# Patient Record
Sex: Male | Born: 1950 | Race: White | Hispanic: No | Smoking: Never smoker
Health system: Southern US, Community
[De-identification: ages and names within clinical notes are randomized; demographics above are authoritative.]

## PROBLEM LIST (undated history)

## (undated) DIAGNOSIS — K759 Inflammatory liver disease, unspecified: Secondary | ICD-10-CM

## (undated) DIAGNOSIS — Z8719 Personal history of other diseases of the digestive system: Secondary | ICD-10-CM

## (undated) DIAGNOSIS — I2510 Atherosclerotic heart disease of native coronary artery without angina pectoris: Secondary | ICD-10-CM

## (undated) DIAGNOSIS — C801 Malignant (primary) neoplasm, unspecified: Secondary | ICD-10-CM

## (undated) DIAGNOSIS — R42 Dizziness and giddiness: Secondary | ICD-10-CM

## (undated) DIAGNOSIS — C4490 Unspecified malignant neoplasm of skin, unspecified: Secondary | ICD-10-CM

## (undated) DIAGNOSIS — T4145XA Adverse effect of unspecified anesthetic, initial encounter: Secondary | ICD-10-CM

## (undated) DIAGNOSIS — K219 Gastro-esophageal reflux disease without esophagitis: Secondary | ICD-10-CM

## (undated) DIAGNOSIS — T7840XA Allergy, unspecified, initial encounter: Secondary | ICD-10-CM

## (undated) DIAGNOSIS — Z8669 Personal history of other diseases of the nervous system and sense organs: Secondary | ICD-10-CM

## (undated) DIAGNOSIS — T8859XA Other complications of anesthesia, initial encounter: Secondary | ICD-10-CM

## (undated) DIAGNOSIS — M1990 Unspecified osteoarthritis, unspecified site: Secondary | ICD-10-CM

## (undated) DIAGNOSIS — N400 Enlarged prostate without lower urinary tract symptoms: Secondary | ICD-10-CM

## (undated) DIAGNOSIS — I10 Essential (primary) hypertension: Secondary | ICD-10-CM

## (undated) DIAGNOSIS — R51 Headache: Secondary | ICD-10-CM

## (undated) DIAGNOSIS — Z85820 Personal history of malignant melanoma of skin: Secondary | ICD-10-CM

## (undated) DIAGNOSIS — R7303 Prediabetes: Secondary | ICD-10-CM

## (undated) DIAGNOSIS — E785 Hyperlipidemia, unspecified: Secondary | ICD-10-CM

## (undated) DIAGNOSIS — F419 Anxiety disorder, unspecified: Secondary | ICD-10-CM

## (undated) DIAGNOSIS — G4730 Sleep apnea, unspecified: Secondary | ICD-10-CM

## (undated) DIAGNOSIS — E8809 Other disorders of plasma-protein metabolism, not elsewhere classified: Secondary | ICD-10-CM

## (undated) HISTORY — DX: Hyperlipidemia, unspecified: E78.5

## (undated) HISTORY — PX: OTHER SURGICAL HISTORY: SHX169

## (undated) HISTORY — DX: Allergy, unspecified, initial encounter: T78.40XA

## (undated) HISTORY — DX: Inflammatory liver disease, unspecified: K75.9

## (undated) HISTORY — DX: Personal history of other diseases of the digestive system: Z87.19

## (undated) HISTORY — PX: KNEE ARTHROSCOPY: SUR90

## (undated) HISTORY — DX: Unspecified malignant neoplasm of skin, unspecified: C44.90

## (undated) HISTORY — PX: NASAL SINUS SURGERY: SHX719

## (undated) HISTORY — PX: HERNIA REPAIR: SHX51

## (undated) HISTORY — PX: COLONOSCOPY: SHX174

## (undated) HISTORY — DX: Personal history of malignant melanoma of skin: Z85.820

## (undated) HISTORY — PX: UPPER GASTROINTESTINAL ENDOSCOPY: SHX188

## (undated) HISTORY — PX: JOINT REPLACEMENT: SHX530

## (undated) HISTORY — PX: KNEE SURGERY: SHX244

## (undated) HISTORY — DX: Headache: R51

## (undated) HISTORY — PX: EYE SURGERY: SHX253

---
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
## 2004-07-26 ENCOUNTER — Encounter: Payer: Self-pay | Admitting: Internal Medicine

## 2004-09-29 ENCOUNTER — Encounter: Payer: Self-pay | Admitting: Internal Medicine

## 2007-10-15 ENCOUNTER — Encounter: Admission: RE | Admit: 2007-10-15 | Discharge: 2007-10-15 | Payer: Self-pay | Admitting: Internal Medicine

## 2008-11-17 ENCOUNTER — Ambulatory Visit: Payer: Self-pay | Admitting: Internal Medicine

## 2008-11-17 DIAGNOSIS — E785 Hyperlipidemia, unspecified: Secondary | ICD-10-CM

## 2008-11-17 DIAGNOSIS — F4323 Adjustment disorder with mixed anxiety and depressed mood: Secondary | ICD-10-CM

## 2008-11-17 DIAGNOSIS — R519 Headache, unspecified: Secondary | ICD-10-CM | POA: Insufficient documentation

## 2008-11-17 DIAGNOSIS — Z85820 Personal history of malignant melanoma of skin: Secondary | ICD-10-CM

## 2008-11-17 DIAGNOSIS — J309 Allergic rhinitis, unspecified: Secondary | ICD-10-CM

## 2008-11-17 DIAGNOSIS — R51 Headache: Secondary | ICD-10-CM

## 2008-11-17 LAB — CONVERTED CEMR LAB
ALT: 71 units/L — ABNORMAL HIGH (ref 0–53)
AST: 29 units/L (ref 0–37)
Albumin: 4.4 g/dL (ref 3.5–5.2)
Alkaline Phosphatase: 58 units/L (ref 39–117)
Basophils Relative: 1 % (ref 0–1)
Calcium: 9.4 mg/dL (ref 8.4–10.5)
Chloride: 106 meq/L (ref 96–112)
Creatinine, Ser: 0.87 mg/dL (ref 0.40–1.50)
Eosinophils Absolute: 0.2 10*3/uL (ref 0.0–0.7)
HDL: 37 mg/dL — ABNORMAL LOW (ref >39)
Lymphs Abs: 3.5 10*3/uL (ref 0.7–4.0)
MCV: 88.6 fL (ref 78.0–100.0)
Neutrophils Relative %: 40 % — ABNORMAL LOW (ref 43–77)
Platelets: 237 10*3/uL (ref 150–400)
Total Protein: 7 g/dL (ref 6.0–8.3)
Triglycerides: 137 mg/dL (ref <150)
WBC: 7.4 10*3/uL (ref 4.0–10.5)

## 2009-01-17 ENCOUNTER — Ambulatory Visit: Payer: Self-pay | Admitting: Internal Medicine

## 2009-01-17 DIAGNOSIS — E119 Type 2 diabetes mellitus without complications: Secondary | ICD-10-CM | POA: Insufficient documentation

## 2009-01-17 DIAGNOSIS — H538 Other visual disturbances: Secondary | ICD-10-CM | POA: Insufficient documentation

## 2009-01-17 DIAGNOSIS — R413 Other amnesia: Secondary | ICD-10-CM

## 2009-01-30 ENCOUNTER — Encounter: Payer: Self-pay | Admitting: Internal Medicine

## 2009-01-30 LAB — HM DIABETES EYE EXAM: HM Diabetic Eye Exam: NORMAL

## 2009-02-03 ENCOUNTER — Telehealth: Payer: Self-pay | Admitting: Internal Medicine

## 2009-02-03 DIAGNOSIS — R252 Cramp and spasm: Secondary | ICD-10-CM

## 2009-02-20 ENCOUNTER — Encounter: Payer: Self-pay | Admitting: Internal Medicine

## 2009-02-21 ENCOUNTER — Telehealth: Payer: Self-pay | Admitting: Internal Medicine

## 2009-03-21 ENCOUNTER — Ambulatory Visit: Payer: Self-pay | Admitting: Internal Medicine

## 2009-03-21 LAB — CONVERTED CEMR LAB
ALT: 44 units/L (ref 0–53)
Bilirubin, Direct: 0.1 mg/dL (ref 0.0–0.3)
Folate: >20 ng/mL
Total Bilirubin: 0.4 mg/dL (ref 0.3–1.2)
Total CHOL/HDL Ratio: 6.9
VLDL: 30 mg/dL (ref 0–40)
Vitamin B-12: 712 pg/mL (ref 211–911)

## 2009-03-22 ENCOUNTER — Ambulatory Visit: Payer: Self-pay | Admitting: Internal Medicine

## 2009-03-22 DIAGNOSIS — G4730 Sleep apnea, unspecified: Secondary | ICD-10-CM

## 2009-03-22 DIAGNOSIS — G4700 Insomnia, unspecified: Secondary | ICD-10-CM | POA: Insufficient documentation

## 2009-03-22 DIAGNOSIS — G4733 Obstructive sleep apnea (adult) (pediatric): Secondary | ICD-10-CM | POA: Insufficient documentation

## 2009-05-10 ENCOUNTER — Ambulatory Visit: Payer: Self-pay | Admitting: Internal Medicine

## 2009-05-10 LAB — CONVERTED CEMR LAB
Alkaline Phosphatase: 51 units/L (ref 39–117)
Bilirubin, Direct: 0.1 mg/dL (ref 0.0–0.3)
Cholesterol: 183 mg/dL (ref 0–200)
Indirect Bilirubin: 0.3 mg/dL (ref 0.0–0.9)
LDL Cholesterol: 115 mg/dL — ABNORMAL HIGH (ref 0–99)
Total Protein: 7 g/dL (ref 6.0–8.3)
Triglycerides: 138 mg/dL (ref <150)

## 2009-05-17 ENCOUNTER — Ambulatory Visit: Payer: Self-pay | Admitting: Internal Medicine

## 2009-08-02 ENCOUNTER — Encounter: Payer: Self-pay | Admitting: Internal Medicine

## 2009-08-07 ENCOUNTER — Ambulatory Visit: Payer: Self-pay | Admitting: Internal Medicine

## 2009-08-07 DIAGNOSIS — G5600 Carpal tunnel syndrome, unspecified upper limb: Secondary | ICD-10-CM

## 2009-08-08 ENCOUNTER — Telehealth: Payer: Self-pay | Admitting: Internal Medicine

## 2009-08-22 ENCOUNTER — Telehealth: Payer: Self-pay | Admitting: Internal Medicine

## 2009-09-03 ENCOUNTER — Emergency Department (HOSPITAL_BASED_OUTPATIENT_CLINIC_OR_DEPARTMENT_OTHER): Admission: EM | Admit: 2009-09-03 | Discharge: 2009-09-03 | Payer: Self-pay | Admitting: Emergency Medicine

## 2009-11-13 ENCOUNTER — Encounter: Payer: Self-pay | Admitting: Internal Medicine

## 2009-11-13 LAB — CONVERTED CEMR LAB
CO2: 25 meq/L (ref 19–32)
Calcium: 9.5 mg/dL (ref 8.4–10.5)
Chloride: 107 meq/L (ref 96–112)
Glucose, Bld: 136 mg/dL — ABNORMAL HIGH (ref 70–99)
Sodium: 140 meq/L (ref 135–145)

## 2009-11-15 ENCOUNTER — Encounter: Payer: Self-pay | Admitting: Gastroenterology

## 2009-11-15 ENCOUNTER — Ambulatory Visit: Payer: Self-pay | Admitting: Internal Medicine

## 2009-11-15 DIAGNOSIS — R198 Other specified symptoms and signs involving the digestive system and abdomen: Secondary | ICD-10-CM | POA: Insufficient documentation

## 2009-11-15 DIAGNOSIS — M25569 Pain in unspecified knee: Secondary | ICD-10-CM

## 2009-11-15 DIAGNOSIS — M1990 Unspecified osteoarthritis, unspecified site: Secondary | ICD-10-CM

## 2009-11-15 LAB — CONVERTED CEMR LAB
Basophils Absolute: 0 10*3/uL (ref 0.0–0.1)
Basophils Relative: 1 % (ref 0–1)
Eosinophils Absolute: 0.2 10*3/uL (ref 0.0–0.7)
Eosinophils Relative: 2 % (ref 0–5)
HCT: 43.3 % (ref 39.0–52.0)
Hemoglobin: 15.4 g/dL (ref 13.0–17.0)
MCHC: 35.6 g/dL (ref 30.0–36.0)
MCV: 86.4 fL (ref 78.0–100.0)
Monocytes Absolute: 0.5 10*3/uL (ref 0.1–1.0)
Monocytes Relative: 7 % (ref 3–12)
Neutro Abs: 3.1 10*3/uL (ref 1.7–7.7)
RDW: 12.5 % (ref 11.5–15.5)

## 2009-11-16 ENCOUNTER — Encounter: Payer: Self-pay | Admitting: Internal Medicine

## 2009-11-16 ENCOUNTER — Telehealth: Payer: Self-pay | Admitting: Internal Medicine

## 2010-01-12 ENCOUNTER — Encounter (INDEPENDENT_AMBULATORY_CARE_PROVIDER_SITE_OTHER): Payer: Self-pay | Admitting: *Deleted

## 2010-01-12 ENCOUNTER — Ambulatory Visit: Payer: Self-pay | Admitting: Internal Medicine

## 2010-01-12 ENCOUNTER — Encounter: Payer: Self-pay | Admitting: Internal Medicine

## 2010-01-12 ENCOUNTER — Encounter: Payer: Self-pay | Admitting: Gastroenterology

## 2010-01-12 DIAGNOSIS — D239 Other benign neoplasm of skin, unspecified: Secondary | ICD-10-CM | POA: Insufficient documentation

## 2010-01-12 LAB — CONVERTED CEMR LAB
Albumin: 4.9 g/dL (ref 3.5–5.2)
Alkaline Phosphatase: 49 units/L (ref 39–117)
BUN: 21 mg/dL (ref 6–23)
CO2: 28 meq/L (ref 19–32)
Chloride: 104 meq/L (ref 96–112)
Creatinine, Ser: 0.95 mg/dL (ref 0.40–1.50)
Glucose, Bld: 131 mg/dL — ABNORMAL HIGH (ref 70–99)
Indirect Bilirubin: 0.2 mg/dL (ref 0.0–0.9)
LDL Cholesterol: 101 mg/dL — ABNORMAL HIGH (ref 0–99)
Potassium: 4.7 meq/L (ref 3.5–5.3)
Total Bilirubin: 0.3 mg/dL (ref 0.3–1.2)
Total Protein: 7.2 g/dL (ref 6.0–8.3)
VLDL: 45 mg/dL — ABNORMAL HIGH (ref 0–40)

## 2010-01-16 ENCOUNTER — Encounter: Payer: Self-pay | Admitting: Internal Medicine

## 2010-01-25 ENCOUNTER — Ambulatory Visit (HOSPITAL_COMMUNITY)
Admission: RE | Admit: 2010-01-25 | Discharge: 2010-01-25 | Payer: Self-pay | Source: Home / Self Care | Admitting: Specialist

## 2010-02-13 ENCOUNTER — Ambulatory Visit: Payer: Self-pay | Admitting: Gastroenterology

## 2010-02-20 ENCOUNTER — Ambulatory Visit
Admission: RE | Admit: 2010-02-20 | Discharge: 2010-02-20 | Payer: Self-pay | Source: Home / Self Care | Attending: Family | Admitting: Family

## 2010-02-20 DIAGNOSIS — K439 Ventral hernia without obstruction or gangrene: Secondary | ICD-10-CM | POA: Insufficient documentation

## 2010-03-07 ENCOUNTER — Telehealth: Payer: Self-pay | Admitting: Internal Medicine

## 2010-03-09 ENCOUNTER — Encounter: Payer: Self-pay | Admitting: Internal Medicine

## 2010-03-13 ENCOUNTER — Encounter: Payer: Self-pay | Admitting: Internal Medicine

## 2010-03-27 ENCOUNTER — Encounter: Payer: Self-pay | Admitting: Internal Medicine

## 2010-03-27 NOTE — Progress Notes (Signed)
Summary: refill--Zolpidem  Phone Note Refill Request Message from:  [facility] [address]   on 08/21/09  6:29PM  Refills Requested: Medication #1:  ZOLPIDEM TARTRATE 10 MG TABS one by mouth at bedtime   Dosage confirmed as above?Dosage Confirmed   Supply Requested: 1 month   Last Refilled: 07/22/2009  Method Requested: Telephone to Pharmacy Next Appointment Scheduled: 11/15/09--Dr. Artist Pais  Follow-up for Phone Call        ok to refill x 3 Follow-up by: D. Thomos Lemons DO,  August 22, 2009 1:34 PM  Additional Follow-up for Phone Call Additional follow up Details #1::        Rx called to pharmacy to [facility] Additional Follow-up by: Glendell Docker CMA,  August 22, 2009 3:32 PM    Prescriptions: ZOLPIDEM TARTRATE 10 MG TABS (ZOLPIDEM TARTRATE) one by mouth at bedtime  #30 x 3   Entered by:   Glendell Docker CMA   Authorized by:   D. Thomos Lemons DO   Signed by:   Glendell Docker CMA on 08/22/2009   Method used:   Telephoned to ...       [facility]  [place] [address] ([facility]ail)       [address]       [place], [place]  [address]       Ph: [phone] or [phone]       Fax: [phone]   RxID:   [id number]

## 2010-03-27 NOTE — Assessment & Plan Note (Signed)
Summary: 2 month follow up/mhf   Vital Signs:  Patient profile:   60 year old male Weight:      241.25 pounds BMI:     32.84 O2 Sat:      96 % on Room air Temp:     98.7 degrees F oral Pulse rate:   77 / minute Pulse rhythm:   regular Resp:     18 per minute BP sitting:   116 / 80  (right arm) Cuff size:   large  Vitals Entered By: Glendell Docker CMA (May 17, 2009 8:14 AM)  O2 Flow:  Room air CC: Rm 3- 2 month follow up    Primary Care Provider:  Dondra Spry DO  CC:  Rm 3- 2 month follow up .  History of Present Illness:  Hyperlipidemia Follow-Up      This is a 60 year old man who presents for Hyperlipidemia follow-up.  The patient denies muscle aches and GI upset.  The patient denies the following symptoms: chest pain/pressure.  Compliance with medications (by patient report) has been near 100%.  Dietary compliance has been good.  The patient reports exercising occasionally.    DM II borderline - watching his carb intake.  lost wt. feeling better  Allergies: 1)  ! Pcn 2)  ! Heparin 3)  ! Succinylcholine Chloride (Succinylcholine Chloride)  Past History:  Past Medical History: Headache Allergic rhinitis  Hx of infectious Hepatitis  Hyperlipidemia  Hx of Ulcerative colitis Hx of melanoma (face and forehead) - previously followed by Derm in [place]. OSA - uses CPAP  Past Surgical History: Tonsillectomy 1969  Appendectomy- 1960    Family History: Family History Diabetes 1st degree relative Family History Ovarian cancer Brain Tumor - brother     Social History: Married Wife works at cath lab at [employer] wife has terminal cancer Occupation: Systems developer  Alcohol use-yes   Never Smoked  Physical Exam  General:  alert, well-developed, and well-nourished.   Head:  normocephalic and atraumatic.   Neck:  No deformities, masses, or tenderness noted.no carotid bruits.   Lungs:  normal respiratory effort and normal breath sounds.   Heart:  normal rate, regular  rhythm, and no gallop.   Extremities:  No lower extremity edema  Psych:  normally interactive, good eye contact, not anxious appearing, and not depressed appearing.     Impression & Recommendations:  Problem # 1:  HYPERLIPIDEMIA (ICD-272.4) He is tolerating Livalo.  Maintain current medication regimen.  His updated medication list for this problem includes:    Livalo 2 Mg Tabs (Pitavastatin calcium) ..... One by mouth once daily  Labs Reviewed: SGOT: 30 (05/10/2009)   SGPT: 56 (05/10/2009)   HDL:40 (05/10/2009), 40 (03/21/2009)  LDL:115 (05/10/2009), 205 (03/21/2009)  Chol:183 (05/10/2009), 275 (03/21/2009)  Trig:138 (05/10/2009), 150 (03/21/2009)  Problem # 2:  DIABETES MELLITUS, TYPE II, BORDERLINE (ICD-790.29) Pt with 5-10 lbs wt loss.  pt advised to limit carb intake to 100 grams per day.  His updated medication list for this problem includes:    Metformin Hcl 500 Mg Tabs (Metformin hcl) .Marland Kitchen... 1/2 by mouth two times a day x 7 days, then one by mouth two times a day  Labs Reviewed: Creat: 0.87 (11/17/2008)     Last Eye Exam: normal (01/30/2009)  Complete Medication List: 1)  Zolpidem Tartrate 10 Mg Tabs (Zolpidem tartrate) .... One by mouth at bedtime 2)  Livalo 2 Mg Tabs (Pitavastatin calcium) .... One by mouth once daily 3)  Metformin  Hcl 500 Mg Tabs (Metformin hcl) .... 1/2 by mouth two times a day x 7 days, then one by mouth two times a day  Patient Instructions: 1)  Please schedule a follow-up appointment in 6 months. 2)  BMP prior to visit, ICD-9:  790.29 3)  HbgA1C prior to visit, ICD-9:  790.29 4)  Please return for lab work one (1) week before your next appointment.   Current Allergies (reviewed today): ! PCN ! HEPARIN ! SUCCINYLCHOLINE CHLORIDE (SUCCINYLCHOLINE CHLORIDE)

## 2010-03-27 NOTE — Letter (Signed)
Summary: Primary Care Consult Scheduled Letter  [place] [facility]  [address]   [place], [place] [address]   Phone: [phone]  Fax: [phone]      01/12/2010 MRN: [id number]  Dylan [name] [address] [place], [place]  [address]    Dear Dylan [name],      We have scheduled an appointment for you.  At the recommendation of Dr.YOO, we have scheduled you a consult with [facility], DR DAN JONES on [address]  at 9:45AM.  Their address is_[address],[place]  [address]. The office phone number is [phone].  If this appointment day and time is not convenient for you, please feel free to call the office of the doctor you are being referred to at the number listed above and reschedule the appointment.     It is important for you to keep your scheduled appointments. We are here to make sure you are given good patient care. If you have questions or you have made changes to your appointment, please notify us at  [phone], ask for Dylan [name].    Thank you, [name] Patient Care Coordinator [place] [facility]

## 2010-03-27 NOTE — Letter (Signed)
Summary: New Patient letter  [facility]  [address] [place], [place] [address]   Phone: [phone]  Fax: [phone]       01/12/2010 MRN: [id number]  Dylan [name] [address] [place], [place]  [address]  Dear Mr. Dokes,  Welcome to the Gastroenterology Division at [facility].    You are scheduled to see Dr.  Christella Hartigan on 02-13-10 at 10:30am on the 3rd floor at [facility], [address]  We ask that you try to arrive at our office 15 minutes prior to your appointment time to allow for check-in.  We would like you to complete the enclosed self-administered evaluation form prior to your visit and bring it with you on the day of your appointment.  We will review it with you.  Also, please bring a complete list of all your medications or, if you prefer, bring the medication bottles and we will list them.  Please bring your insurance card so that we may make a copy of it.  If your insurance requires a referral to see a specialist, please bring your referral form from your primary care physician.  Co-payments are due at the time of your visit and may be paid by cash, check or credit card.     Your office visit will consist of a consult with your physician (includes a physical exam), any laboratory testing he/she may order, scheduling of any necessary diagnostic testing (e.g. x-ray, ultrasound, CT-scan), and scheduling of a procedure (e.g. Endoscopy, Colonoscopy) if required.  Please allow enough time on your schedule to allow for any/all of these possibilities.    If you cannot keep your appointment, please call [phone] to cancel or reschedule prior to your appointment date.  This allows [place] the opportunity to schedule an appointment for another patient in need of care.  If you do not cancel or reschedule by 5 p.m. the business day prior to your appointment date, you will be charged a $50.00 late cancellation/no-show fee.    Thank you for choosing  [facility] for your medical needs.  We appreciate the opportunity to care for you.  Please visit [place] at our website  to learn more about our practice.                     Sincerely,                                                             The Gastroenterology Division

## 2010-03-27 NOTE — Progress Notes (Signed)
Summary: Cpap Setting  Phone Note Outgoing Call   Summary of Call: call pt - we received fax from [employer] (CPAP company).  They recommend CPAP setting of 10 cm H2O Initial call taken by: D. Thomos Lemons DO,  August 08, 2009 4:31 PM  Follow-up for Phone Call        attempted to contact patient at  [phone], no answer, detailed voice message left per patient request informing him per Dr Artist Pais instructions. Message left for patient to call if any questions Follow-up by: Glendell Docker CMA,  August 09, 2009 9:36 AM

## 2010-03-27 NOTE — Assessment & Plan Note (Signed)
Summary: CLEARANCE FOR SURGERY/MHF   Vital Signs:  Patient profile:   60 year old male Height:      72 inches Weight:      244 pounds BMI:     33.21 O2 Sat:      96 % on Room air Temp:     98.3 degrees F oral Pulse rate:   83 / minute BP sitting:   124 / 84  (left arm) Cuff size:   large  Vitals Entered By: Payton Spark CMA (January 12, 2010 8:39 AM)  O2 Flow:  Room air                        CC: Surgical clearance for knee surgery. Also requests fasting labs, Pre-op Evaluation   Primary Care Provider:  D. Thomos Lemons DO  CC:  Surgical clearance for knee surgery. Also requests fasting labs and Pre-op Evaluation.  History of Present Illness:  Pre-Op Evaluation      60 y/o white male with hx of hyperlipidemia and borderline DM II for Pre-op Evaluation.  The patient denies respiratory symptoms, chest pain, and smoking.  pt drinks 1-2 beers daily.    Current Medications (verified): 1)  Zolpidem Tartrate 10 Mg Tabs (Zolpidem Tartrate) .... One By Mouth At Bedtime 2)  Livalo 2 Mg Tabs (Pitavastatin Calcium) .... One By Mouth Once Daily 3)  Metformin Hcl 500 Mg Tabs (Metformin Hcl) .... Take 1 Tablet By Mouth Two Times A Day  Allergies (verified): 1)  ! Pcn 2)  ! Heparin 3)  ! Succinylcholine Chloride (Succinylcholine Chloride)  Past History:  Past Medical History: Headache Allergic rhinitis  Hx of infectious Hepatitis    Hyperlipidemia   Hx of Ulcerative colitis Hx of melanoma (face and forehead) - previously followed by Derm in [place]. OSA - uses CPAP  Past Surgical History: Tonsillectomy 1969  Appendectomy- 1960    Prev right knee - surgery  Family History: Family History Diabetes 1st degree relative Family History Ovarian cancer Brain Tumor - brother        Social History: Married Wife works at cath lab at [employer] wife has terminal cancer Occupation: Systems developer    Alcohol use-yes   Never Smoked   Review of Systems       pt also requests referral to  [employer] for skin cancer screening  Physical Exam  General:  alert, well-developed, and well-nourished.   Neck:  No deformities, masses, or tenderness noted.no carotid bruits.   Lungs:  normal respiratory effort and normal breath sounds.   Heart:  normal rate, regular rhythm, no murmur, and no gallop.   Extremities:  No lower extremity edema Neurologic:  cranial nerves II-XII intact and gait normal.   Psych:  normally interactive, good eye contact, not anxious appearing, and not depressed appearing.     Impression & Recommendations:  Problem # 1:  PREOPERATIVE EXAMINATION (ICD-V72.84) 60 y/o scheduled for knee surgery. 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   -Left atrial enlargement.   no further cardiac testing recommended proceed with knee surgery  Problem # 2:  DYSPLASTIC NEVUS (ICD-216.9)  Orders: Dermatology Referral (Derma)  Complete Medication List: 1)  Zolpidem Tartrate 10 Mg Tabs (Zolpidem tartrate) .... One by mouth at bedtime 2)  Livalo 2 Mg Tabs (Pitavastatin calcium) .... One by mouth once daily 3)  Metformin Hcl 500 Mg Tabs (Metformin hcl) .... Take  1 tablet by mouth two times a day  Other Orders: T-Basic Metabolic Panel [phone]) T- Hemoglobin A1C [phone]) T-Lipid Profile [id number]) T-Hepatic Function [phone])  Patient Instructions: 1)  Please schedule a follow-up appointment in 59 months. Prescriptions: METFORMIN HCL 500 MG TABS (METFORMIN HCL) Take 1 tablet by mouth two times a day  #60 Tablet x 5   Entered and Authorized by:   D. Thomos Lemons DO   Signed by:   D. Thomos Lemons DO on 01/12/2010   Method used:   [facility]        [facility]  [place] [address] [facility]       [address]       [place], [place]  [address]       Ph: [phone] or [phone]       Fax: [phone]   RxID:   [id number] LIVALO 2 MG TABS (PITAVASTATIN CALCIUM) one by  mouth once daily  #30 Tablet x 5   Entered and Authorized by:   D. Thomos Lemons DO   Signed by:   D. Thomos Lemons DO on 01/12/2010   Method used:   [facility]        [facility]  [place] [address] ([facility]       [address]       [place], [place]  [address]       Ph: [phone] or [phone]       Fax: [phone]   RxID:   [id number]    Orders Added: 1)  T-Basic Metabolic Panel [phone] 2)  T- Hemoglobin A1C [[id number]] 3)  T-Lipid Profile [[id number]] 4)  T-Hepatic Function [[id number]] 5)  Dermatology Referral [Derma] 6)  Est. Patient Level III [[address]]

## 2010-03-27 NOTE — Letter (Signed)
   [place] [facility] [address] [place], [place]  [address]  [place] Phone: [phone]      January 16, 2010   NAKAI POLLIO [address] [place], [place] [address]  RE:  LAB RESULTS  Dear  Mr. Warf,  The following is an interpretation of your most recent lab tests.  Please take note of any instructions provided or changes to medications that have resulted from your lab work.  ELECTROLYTES:  Good - no changes needed  KIDNEY FUNCTION TESTS:  Good - no changes needed  LIVER FUNCTION TESTS:  Stable - no changes needed  LIPID PANEL:  Abnormal - schedule a follow-up appointment Triglyceride: 224   Cholesterol: 179   LDL: 101   HDL: 33   Chol/HDL%:  5.4 Ratio  THYROID STUDIES:  Thyroid studies normal TSH: 1.781     DIABETIC STUDIES:  Fair - schedule a follow-up appointment Blood Glucose: 131   HgbA1C: 6.3          Sincerely Yours,    Dr. Thomos Lemons  Appended Document:  Mailed.

## 2010-03-27 NOTE — Letter (Signed)
Summary: New Patient letter  [facility]  [address] [place], [place] [address]   Phone: [phone]  Fax: [phone]       11/15/2009 MRN: [id number]  Dylan [name] [address] [place], [place]  [address]  Dear Dylan [name],  Welcome to the Gastroenterology Division at [facility].    You are scheduled to see Dr.  Christella Hartigan on 12-29-09 at 8:30am on the 3rd floor at [facility], [address].  We ask that you try to arrive at our office 15 minutes prior to your appointment time to allow for check-in.  We would like you to complete the enclosed self-administered evaluation form prior to your visit and bring it with you on the day of your appointment.  We will review it with you.  Also, please bring a complete list of all your medications or, if you prefer, bring the medication bottles and we will list them.  Please bring your insurance card so that we may make a copy of it.  If your insurance requires a referral to see a specialist, please bring your referral form from your primary care physician.  Co-payments are due at the time of your visit and may be paid by cash, check or credit card.     Your office visit will consist of a consult with your physician (includes a physical exam), any laboratory testing he/she may order, scheduling of any necessary diagnostic testing (e.g. x-ray, ultrasound, CT-scan), and scheduling of a procedure (e.g. Endoscopy, Colonoscopy) if required.  Please allow enough time on your schedule to allow for any/all of these possibilities.    If you cannot keep your appointment, please call [phone] to cancel or reschedule prior to your appointment date.  This allows [place] the opportunity to schedule an appointment for another patient in need of care.  If you do not cancel or reschedule by 5 p.m. the business day prior to your appointment date, you will be charged a $50.00 late cancellation/no-show fee.    Thank you for choosing  [facility] for your medical needs.  We appreciate the opportunity to care for you.  Please visit [place] at our website  to learn more about our practice.                     Sincerely,                                                             The Gastroenterology Division

## 2010-03-27 NOTE — Assessment & Plan Note (Signed)
Summary: 2 MONTH FOLLO WUP/MHF   Vital Signs:  Patient profile:   60 year old male Weight:      244.50 pounds BMI:     33.28 O2 Sat:      95 % on Room air Temp:     98.1 degrees F oral Pulse rate:   80 / minute Pulse rhythm:   regular Resp:     18 per minute BP sitting:   120 / 70  (right arm) Cuff size:   large  Vitals Entered By: Dylan [name] CMA (March 22, 2009 8:14 AM)  O2 Flow:  Room air  Primary Care Provider:  D. Thomos Lemons DO  CC:  2 Month Follow up .  History of Present Illness: 2 Month Follow up  Hyperlipidemia Follow-Up      This is a 60 year old man who presents for Hyperlipidemia follow-up.  The patient reports muscle aches and fatigue, but denies GI upset.  The patient denies the following symptoms: chest pain/pressure.  Dietary compliance has been fair.  Pt stopped taking crestor.  OSA - uses CPAP but feels tired.  it has been years since prev CPAP titration  DM II - eating better.   decreased sugary beverage intake.  wt stable  A1c is higher  Preventive Screening-Counseling & Management  Alcohol-Tobacco     Smoking Status: never  Allergies: 1)  ! Pcn 2)  ! Heparin 3)  ! Succinylcholine Chloride (Succinylcholine Chloride)  Past History:  Past Medical History: Headache Allergic rhinitis  Hx of infectious Hepatitis  Hyperlipidemia Hx of Ulcerative colitis Hx of melanoma (face and forehead) - previously followed by Derm in [place]. OSA - uses CPAP  Family History: Family History Diabetes 1st degree relative Family History Ovarian cancer Brain Tumor - brother    Social History: Married Wife works at cath lab at [employer] wife has terminal cancer Occupation: Systems developer  Alcohol use-yes  Never Smoked  Review of Systems  The patient denies weight loss, weight gain, and chest pain.    Physical Exam  General:  alert and overweight-appearing.   Mouth:  pharynx pink and moist.   Neck:  No deformities, masses, or tenderness noted.no  carotid bruits.   Lungs:  Normal respiratory effort, chest expands symmetrically. Lungs are clear to auscultation, no crackles or wheezes. Heart:  Normal rate and regular rhythm. S1 and S2 normal without gallop, murmur, click, rub or other extra sounds. Extremities:  No lower extremity edema  Psych:  normally interactive and good eye contact.     Impression & Recommendations:  Problem # 1:  SLEEP APNEA, OBSTRUCTIVE, MODERATE (ICD-327.23) It has been years since prev sleep study.  Pt having fatigue and poor sleep.   arrange CPAP titration.  He may need higher pressure. Orders: Misc. Referral (Misc. Ref)  Problem # 2:  HYPERLIPIDEMIA (ICD-272.4) Pt having muscle cramps with Crestor.  CPK only minimally elevated.  Trial of livalo.    The following medications were removed from the medication list:    Crestor 10 Mg Tabs (Rosuvastatin calcium) ..... One by mouth once daily His updated medication list for this problem includes:    Livalo 2 Mg Tabs (Pitavastatin calcium) ..... One by mouth once daily  Problem # 3:  DIABETES MELLITUS, TYPE II, BORDERLINE (ICD-790.29) A1c elevated at 6.4.  discussed importance of lifestyle changes.  His updated medication list for this problem includes:    Metformin Hcl 500 Mg Tabs (Metformin hcl) .Marland Kitchen... 1/2 by mouth two times a  day x 7 days, then one by mouth two times a day  Labs Reviewed: Creat: 0.87 (11/17/2008)     Last Eye Exam: normal (01/30/2009)  Problem # 4:  INSOMNIA (ICD-780.52) We tried amitriptyline due to concerns of memory loss  with ambien.   no change.   resume ambien His updated medication list for this problem includes:    Zolpidem Tartrate 10 Mg Tabs (Zolpidem tartrate) ..... One by mouth at bedtime  Complete Medication List: 1)  Zolpidem Tartrate 10 Mg Tabs (Zolpidem tartrate) .... One by mouth at bedtime 2)  Livalo 2 Mg Tabs (Pitavastatin calcium) .... One by mouth once daily 3)  Metformin Hcl 500 Mg Tabs (Metformin hcl) ....  1/2 by mouth two times a day x 7 days, then one by mouth two times a day  Patient Instructions: 1)  Please schedule a follow-up appointment in 2 months. 2)  Hepatic Panel prior to visit, ICD-9: 272.4 3)  Lipid Panel prior to visit, ICD-9: 272.4 4)  CPK:  729.82 5)  vit D level:  729.82 6)  Please return for lab work one (1) week before your next appointment.  Prescriptions: ZOLPIDEM TARTRATE 10 MG TABS (ZOLPIDEM TARTRATE) one by mouth at bedtime  #30 x 2   Entered and Authorized by:   D. Thomos Lemons DO   Signed by:   D. Thomos Lemons DO on 03/22/2009   Method used:   Print then Give to Patient   RxID:   [id number] METFORMIN HCL 500 MG TABS (METFORMIN HCL) 1/2 by mouth two times a day x 7 days, then one by mouth two times a day  #60 x 2   Entered and Authorized by:   D. Thomos Lemons DO   Signed by:   D. Thomos Lemons DO on 03/22/2009   Method used:   [facility]        [facility]  [place] [address] [facility]       [address]       [place], [place]  [address]       Ph: [phone] or [phone]       Fax: [phone]   RxID:   [id number] LIVALO 2 MG TABS (PITAVASTATIN CALCIUM) one by mouth once daily  #30 x 3   Entered and Authorized by:   D. Thomos Lemons DO   Signed by:   D. Thomos Lemons DO on 03/22/2009   Method used:   [facility]        [facility]  [place] [address] [facility]       [address]       [place], [place]  [address]       Ph: [phone] or [phone]       Fax: [phone]   RxID:   [id number]   Current Allergies (reviewed today): ! PCN ! HEPARIN ! SUCCINYLCHOLINE CHLORIDE (SUCCINYLCHOLINE CHLORIDE)

## 2010-03-27 NOTE — Consult Note (Signed)
Summary: [facility]  [facility]   Imported By: Lanelle Bal 11/28/2009 09:53:57  _____________________________________________________________________  External Attachment:    Type:   Image     Comment:   External Document

## 2010-03-27 NOTE — Progress Notes (Signed)
Summary: Lab Results  Phone Note Outgoing Call   Summary of Call: plz call pt - blood work (rheumatoid factor, ANA, uric acid level and sed rate - normal).  no abnormality to suggest inflammatory arthritis Initial call taken by: D. Thomos Lemons DO,  November 16, 2009 12:04 PM  Follow-up for Phone Call        call placed to patient at  [phone], wife stated patient was not available, she has been advised per Dr Artist Pais instructions Follow-up by: Glendell Docker CMA,  November 16, 2009 1:13 PM

## 2010-03-27 NOTE — Letter (Signed)
Summary: Request for Surgical Clearance/[facility]  Request for Surgical Clearance/[facility]   Imported By: Maryln Gottron 01/23/2010 11:11:04  _____________________________________________________________________  External Attachment:    Type:   Image     Comment:   External Document

## 2010-03-27 NOTE — Miscellaneous (Signed)
Summary: bmp,hgb a1c  Clinical Lists Changes  Orders: Added new Test order of T-Basic Metabolic Panel [phone]) - Signed Added new Test order of T- Hemoglobin A1C ([id number]) - Signed

## 2010-03-27 NOTE — Assessment & Plan Note (Signed)
Summary: needs referral for sleep study/dt--Rm 3   Vital Signs:  Patient profile:   60 year old male Height:      72 inches Weight:      241.75 pounds BMI:     32.91 Temp:     98.1 degrees F oral Pulse rate:   78 / minute Pulse rhythm:   regular Resp:     16 per minute BP sitting:   138 / 70  (right arm) Cuff size:   large  Vitals Entered By: Mervin Kung CMA (August 07, 2009 9:07 AM) CC: Room 3   Pt would like to get new CPAP machine and needs sleep study repeated. Is Patient Diabetic? Yes Comments Pt states no changes with meds.   Primary Care Provider:  Dondra Spry DO  CC:  Room 3   Pt would like to get new CPAP machine and needs sleep study repeated.Marland Kitchen  History of Present Illness: 60 y/o white male for follow up pt has hx of sleep apnea.  it has been a while since last sleep study he has managed to lose some weight lese beer intake  he c/o intermittent right hand numbness and tingling no neck pain    Allergies: 1)  ! Pcn 2)  ! Heparin 3)  ! Succinylcholine Chloride (Succinylcholine Chloride)  Past History:  Past Medical History: Headache Allergic rhinitis  Hx of infectious Hepatitis   Hyperlipidemia  Hx of Ulcerative colitis Hx of melanoma (face and forehead) - previously followed by Derm in [place]. OSA - uses CPAP  Past Surgical History: Tonsillectomy 1969  Appendectomy- 1960     Family History: Family History Diabetes 1st degree relative Family History Ovarian cancer Brain Tumor - brother      Social History: Married Wife works at cath lab at [facility] [employer] [facility] wife has terminal cancer Occupation: Systems developer   Alcohol use-yes   Never Smoked  Physical Exam  General:  alert, well-developed, and well-nourished.   Lungs:  normal respiratory effort and normal breath sounds.   Heart:  normal rate, regular rhythm, and no gallop.   Msk:  positive phalens Extremities:  No lower extremity edema    Impression & Recommendations:  Problem # 1:  SLEEP  APNEA, OBSTRUCTIVE, MODERATE (ICD-327.23) Pt with hx of OSA.  not sure if pt using right setting.  we recently arrange in home CPAP titration study but results not yet avail.  he requests f/u with sleep specialist refer to Dr. Vassie Loll Orders: Sleep Disorder Referral (Sleep Disorder)  Problem # 2:  CARPAL TUNNEL SYNDROME (ICD-354.0) Pt with right hand numbness and paraesthesia.  probable CTS.  trial of wrist splint.  If no improvement, refer to Dr. Teressa Senter  Complete Medication List: 1)  Zolpidem Tartrate 10 Mg Tabs (Zolpidem tartrate) .... One by mouth at bedtime 2)  Livalo 2 Mg Tabs (Pitavastatin calcium) .... One by mouth once daily 3)  Metformin Hcl 500 Mg Tabs (Metformin hcl) .... Take 1 tablet by mouth two times a day  Patient Instructions: 1)  Use Large or extra Large right wrist splint every night 2)  If your wrist symptoms do not improve within 2 weeks, call our office to arrange referral to Dr. Teressa Senter 3)  Our office will contact you re:  referral to Dr. Vassie Loll (sleep specialist)  Current Allergies (reviewed today): ! PCN ! HEPARIN ! SUCCINYLCHOLINE CHLORIDE (SUCCINYLCHOLINE CHLORIDE)

## 2010-03-27 NOTE — Assessment & Plan Note (Signed)
Summary: 6 month follow up/mhf   Vital Signs:  Patient profile:   60 year old male Weight:      239 pounds BMI:     32.53 O2 Sat:      96 % on Room air Temp:     98.2 degrees F oral Pulse rate:   83 / minute Pulse rhythm:   regular Resp:     18 per minute BP sitting:   116 / 60  (right arm) Cuff size:   large  Vitals Entered By: Glendell Docker CMA (November 15, 2009 8:26 AM)  O2 Flow:  Room air CC: 6 Month follow up  Is Patient Diabetic? No Pain Assessment Patient in pain? no      Comments c/o increase in joint pain, refill on Ambien   Primary Care Provider:  Dondra Spry DO  CC:  6 Month follow up .  History of Present Illness: 60 y/o white male for f/u c/o chronic joint pains bilateral knees  - hx of sports injury ankles,  tendons also seem to hurt joints occ look slightly swollen knees sometimes appear red severity of pain 8 out of 10    Preventive Screening-Counseling & Management  Alcohol-Tobacco     Smoking Status: never  Allergies: 1)  ! Pcn 2)  ! Heparin 3)  ! Succinylcholine Chloride (Succinylcholine Chloride)  Past History:  Past Medical History: Headache Allergic rhinitis  Hx of infectious Hepatitis    Hyperlipidemia  Hx of Ulcerative colitis Hx of melanoma (face and forehead) - previously followed by Derm in [place]. OSA - uses CPAP  Family History: Family History Diabetes 1st degree relative Family History Ovarian cancer Brain Tumor - brother       Social History: Married Wife works at cath lab at [employer] wife has terminal cancer Occupation: Systems developer    Alcohol use-yes   Never Smoked  Review of Systems  The patient denies fever, weight loss, and weight gain.         loose stools  Physical Exam  General:  alert, well-developed, and well-nourished.   Head:  normocephalic and atraumatic.   Lungs:  normal respiratory effort and normal breath sounds.   Heart:  normal rate, regular rhythm, and no gallop.   Abdomen:   soft, non-tender, and normal bowel sounds.   Msk:  bilateral knee crepitus,  no joint effusion or swelling,  no redness Extremities:  No lower extremity edema Neurologic:  cranial nerves II-XII intact and gait normal.   Psych:  normally interactive, good eye contact, not anxious appearing, and not depressed appearing.     Impression & Recommendations:  Problem # 1:  ARTHRITIS, GENERALIZED (ICD-716.99) I doubt inflammatory arthritis.  check labs  Orders: T-Uric Acid (Blood) ([id number]) T-Rheumatoid Factor [phone]) T-Antinuclear Antib (ANA) [phone])  Problem # 2:  CHANGE IN BOWELS (ICD-787.99)  Orders: T-CBC w/Diff ([id number]) T-Sed Rate (Automated) ([id number]) Gastroenterology Referral (GI)  Problem # 3:  INSOMNIA (ICD-780.52)  His updated medication list for this problem includes:    Zolpidem Tartrate 10 Mg Tabs (Zolpidem tartrate) ..... One by mouth at bedtime  Problem # 4:  DIABETES MELLITUS, TYPE II, BORDERLINE (ICD-790.29) Assessment: Comment Only  His updated medication list for this problem includes:    Metformin Hcl 500 Mg Tabs (Metformin hcl) .Marland Kitchen... Take 1 tablet by mouth two times a day  Complete Medication List: 1)  Zolpidem Tartrate 10 Mg Tabs (Zolpidem tartrate) .... One by mouth at bedtime 2)  Livalo 2 Mg Tabs (  Pitavastatin calcium) .... One by mouth once daily 3)  Metformin Hcl 500 Mg Tabs (Metformin hcl) .... Take 1 tablet by mouth two times a day 4)  Tramadol Hcl 50 Mg Tabs (Tramadol hcl) .... One by mouth two times a day as needed  Other Orders: Influenza Vaccine NON MCR (29562) Flu Vaccine 90yrs + MEDICARE PATIENTS (Z3086) Orthopedic Referral (Ortho)  Patient Instructions: 1)  Please schedule a follow-up appointment in 2 months. Prescriptions: TRAMADOL HCL 50 MG TABS (TRAMADOL HCL) one by mouth two times a day as needed  #30 x 0   Entered and Authorized by:   D. Thomos Lemons DO   Signed by:   D. Thomos Lemons DO on 11/15/2009   Method  used:   [facility]        [facility] [address] [facility]       [address]       [place], [place]  [address]       Ph: [phone] or [phone]       Fax: [phone]   RxID:   [id number] ZOLPIDEM TARTRATE 10 MG TABS (ZOLPIDEM TARTRATE) one by mouth at bedtime  #30 x 3   Entered and Authorized by:   D. Thomos Lemons DO   Signed by:   D. Thomos Lemons DO on 11/15/2009   Method used:   Print then Give to Patient   RxID:   [id number]   Current Allergies (reviewed today): ! PCN ! HEPARIN ! SUCCINYLCHOLINE CHLORIDE (SUCCINYLCHOLINE CHLORIDE)   Immunizations Administered:  Influenza Vaccine # 1:    Vaccine Type: Fluvax Non-MCR    Site: left deltoid    Mfr: GlaxoSmithKline    Dose: 0.5 ml    Route: IM    Given by: Glendell Docker CMA    Exp. Date: 08/25/2010    Lot #: [id number]    VIS given: 09/19/09 version given November 15, 2009.  Flu Vaccine Consent Questions:    Do you have a history of severe allergic reactions to this vaccine? no    Any prior history of allergic reactions to egg and/or gelatin? no    Do you have a sensitivity to the preservative Thimersol? no    Do you have a past history of Guillan-Barre Syndrome? no    Do you currently have an acute febrile illness? no    Have you ever had a severe reaction to latex? no    Vaccine information given and explained to patient? yes

## 2010-03-29 NOTE — Progress Notes (Signed)
Summary: refill--zolpidem  Phone Note Refill Request Message from:  Fax from Pharmacy on March 07, 2010 8:00 AM  Refills Requested: Medication #1:  ZOLPIDEM TARTRATE 10 MG TABS one by mouth at bedtime   Dosage confirmed as above?Dosage Confirmed   Brand Name Necessary? No   Supply Requested: 1 month   [facility] [address] [place] [address] fax [phone]   Method Requested: Electronic Next Appointment Scheduled: 07-13-2010 Dr Artist Pais  Initial call taken by: Roselle Locus,  March 07, 2010 8:01 AM  Follow-up for Phone Call        ok to refill x 3 Follow-up by: D. Thomos Lemons DO,  March 07, 2010 12:39 PM  Additional Follow-up for Phone Call Additional follow up Details #1::        Refill left on pharmacy voicemail. Nicki Guadalajara Fergerson CMA (AAMA)  March 07, 2010 1:29 PM     Prescriptions: ZOLPIDEM TARTRATE 10 MG TABS (ZOLPIDEM TARTRATE) one by mouth at bedtime  #30 x 2   Entered by:   Mervin Kung CMA (AAMA)   Authorized by:   D. Thomos Lemons DO   Signed by:   Mervin Kung CMA (AAMA) on 03/07/2010   Method used:   Telephoned to ...       [facility]  [place] [address] [facility]ail[facility]       [address]       [place], [place]  [address]       Ph: [phone] or [phone]       Fax: [phone]   RxID:   [id number]

## 2010-03-29 NOTE — Assessment & Plan Note (Signed)
Summary: hernia/mhf   Vital Signs:  Patient profile:   60 year old male Height:      72 inches Weight:      250.50 pounds BMI:     34.10 O2 Sat:      97 % on Room air Temp:     98.3 degrees F oral Pulse rate:   91 / minute Resp:     20 per minute BP sitting:   138 / 70  (right arm) Cuff size:   large  Vitals Entered By: Glendell Docker CMA (February 20, 2010 12:59 PM)  O2 Flow:  Room air CC: Bulge in Abdomen Is Patient Diabetic? No Pain Assessment Patient in pain? no      Comments v shape bulge in belly, greater that 3 months, he is now having increased amounts of gas and pain   Primary Care Provider:  Dondra Spry DO  CC:  Bulge in Abdomen.  History of Present Illness: Dylan [name] is a 60 year old male who presents today with complaint of abdominal discomfort and bulging. Symptoms started approximately 6 months ago, though they have worsened recently after he tried to do some stretching exercises and use an elliptical machine. He denies prior history of this discomfort or of known hernia.  Symptoms are made worse by exercise,  and improve with rest. Symptoms are associated with "gas and belching," but are not associated with nausea or vomitting.  Reports that his bowel movements have been normal.   Preventive Screening-Counseling & Management  Alcohol-Tobacco     Smoking Status: never  Allergies: 1)  ! Pcn 2)  ! Heparin 3)  ! Succinylcholine Chloride (Succinylcholine Chloride)  Past History:  Past Medical History: Last updated: 01/12/2010 Headache Allergic rhinitis  Hx of infectious Hepatitis    Hyperlipidemia   Hx of Ulcerative colitis Hx of melanoma (face and forehead) - previously followed by Derm in [place]. OSA - uses CPAP  Past Surgical History: Last updated: 01/12/2010 Tonsillectomy 1969  Appendectomy- 1960    Prev right knee - surgery  Review of Systems       see HPI  Physical Exam  General:  Well-developed,overweight white male in no  acute distress; alert,appropriate and cooperative throughout examination Head:  Normocephalic and atraumatic without obvious abnormalities. No apparent alopecia or balding. Lungs:  Normal respiratory effort, chest expands symmetrically. Lungs are clear to auscultation, no crackles or wheezes. Heart:  Normal rate and regular rhythm. S1 and S2 normal without gallop, murmur, click, rub or other extra sounds. Abdomen:  midline ventral hernia- most pronounced with straining.  Fully reducible.  Mild overlying abdominal tenderness.  Abdomen is soft. + BS, no guarding. Extremities:  No peripheral edema is noted Psych:  Cognition and judgment appear intact. Alert and cooperative with normal attention span and concentration. No apparent delusions, illusions, hallucinations   Impression & Recommendations:  Problem # 1:  VENTRAL HERNIA (ICD-553.20) Assessment New Symptoms consistent with a reducible ventral hernia.  Will plan referral to surgery for formal evaluation.  Pt was advised to avoid heavy lifting and movements/exercise that exacerbate his pain.  Also advised to go to the ER if he develops severe pain or vomitting.  Pt verbalizes understanding.  Discussed importance of weight loss.  Orders: Surgical Referral (Surgery)  Complete Medication List: 1)  Zolpidem Tartrate 10 Mg Tabs (Zolpidem tartrate) .... One by mouth at bedtime 2)  Livalo 2 Mg Tabs (Pitavastatin calcium) .... One by mouth once daily 3)  Metformin Hcl 500 Mg  Tabs (Metformin hcl) .... Take 1 tablet by mouth two times a day  Patient Instructions: 1)  You will be contacted about your referral to the surgeon. 2)  Go to ER if you develop severe abdominal pain, or vomitting.   3)  Plan to follow up with Dr. Artist Pais in May as scheduled.   Orders Added: 1)  Surgical Referral [Surgery] 2)  Est. Patient Level IV [[address]]     Current Allergies (reviewed today): ! PCN ! HEPARIN ! SUCCINYLCHOLINE CHLORIDE (SUCCINYLCHOLINE CHLORIDE)

## 2010-04-04 NOTE — Consult Note (Signed)
Summary: [facility]   Imported By: Sherian Rein 03/27/2010 10:27:33  _____________________________________________________________________  External Attachment:    Type:   Image     Comment:   External Document

## 2010-04-18 NOTE — Consult Note (Signed)
Summary: [facility]   Imported By: Maryln Gottron 04/13/2010 10:15:28  _____________________________________________________________________  External Attachment:    Type:   Image     Comment:   External Document

## 2010-05-08 LAB — GLUCOSE, CAPILLARY
Glucose-Capillary: 106 mg/dL — ABNORMAL HIGH (ref 70–99)
Glucose-Capillary: 149 mg/dL — ABNORMAL HIGH (ref 70–99)

## 2010-05-08 LAB — COMPREHENSIVE METABOLIC PANEL
AST: 24 U/L (ref 0–37)
BUN: 19 mg/dL (ref 6–23)
CO2: 26 mEq/L (ref 19–32)
Calcium: 9.6 mg/dL (ref 8.4–10.5)
Chloride: 106 mEq/L (ref 96–112)
Creatinine, Ser: 0.94 mg/dL (ref 0.4–1.5)
GFR calc Af Amer: >60 mL/min (ref >60)
GFR calc non Af Amer: >60 mL/min (ref >60)
Total Bilirubin: 0.5 mg/dL (ref 0.3–1.2)

## 2010-05-18 ENCOUNTER — Telehealth: Payer: Self-pay | Admitting: Internal Medicine

## 2010-05-18 DIAGNOSIS — G4700 Insomnia, unspecified: Secondary | ICD-10-CM

## 2010-05-18 NOTE — Telephone Encounter (Signed)
Zolpidem tartrate 10 mg tablet. Take 1 tablet by mouth at bedtime. Qty 30. Last fill 2.29.12.   [facility] [facility]

## 2010-05-21 MED ORDER — ZOLPIDEM TARTRATE 10 MG PO TABS: 10.0000 mg | ORAL_TABLET | Freq: Every evening | ORAL | Status: DC | PRN

## 2010-05-21 NOTE — Telephone Encounter (Signed)
Rx refill called to pharmacy.

## 2010-05-21 NOTE — Telephone Encounter (Signed)
Ok to refill x 3 

## 2010-06-19 ENCOUNTER — Encounter: Payer: Self-pay | Admitting: Family

## 2010-06-19 ENCOUNTER — Ambulatory Visit (INDEPENDENT_AMBULATORY_CARE_PROVIDER_SITE_OTHER): Payer: Managed Care, Other (non HMO) | Admitting: Family

## 2010-06-19 DIAGNOSIS — J329 Chronic sinusitis, unspecified: Secondary | ICD-10-CM

## 2010-06-19 MED ORDER — CLARITHROMYCIN 500 MG PO TABS: 500.0000 mg | ORAL_TABLET | Freq: Two times a day (BID) | ORAL | Status: AC

## 2010-06-19 NOTE — Progress Notes (Signed)
  Subjective:    Patient ID: Dylan [name], male    DOB: 27-Feb-1950, 60 y.o.   MRN: [id number]  HPI  Dylan [name] is a 60 year old male who presents today with complaint of green nasal congestin/discharge.  2 day hx of associated "Headache behind the eyes." Denies associated fever.  He has tried OTC Mucinex D without improvement.  + post nasal drip.  He notes that for the last 1 month he has had nasal congestion due to allergies.      Review of Systems  See HPI  Past Medical History  Diagnosis Date  . Allergy     allergic rhinitis  . Hyperlipidemia   . Headache   . Hepatitis     history of infections Hepatitis  . History of ulcerative colitis   . History of melanoma     face and forehead-- previously followed by [facility] in [place]    History   Social History  . Marital Status: Married    Spouse Name: N/A    Number of Children: N/A  . Years of Education: N/A   Occupational History  . Not on file.   Social History Main Topics  . Smoking status: Never Smoker   . Smokeless tobacco: Former Neurosurgeon    Quit date: 02/26/2003  . Alcohol Use: Yes  . Drug Use: Not on file  . Sexually Active: Not on file   Other Topics Concern  . Not on file   Social History Narrative   Wife works at cath lab at [employer] terminal cancer    Past Surgical History  Procedure Date  . Tonsillectomy 1969  . Appendectomy 1969  . Knee surgery     right knee    Family History  Problem Relation Age of Onset  . Cancer Brother     brain tumor  . Diabetes Other   . Cancer Other     ovarian    Allergies  Allergen Reactions  . Heparin     REACTION: breathing problems  . Penicillins     REACTION: Rash  . Succinylcholine Chloride     REACTION: does not wake up from anesthetic    Current Outpatient Prescriptions on File Prior to Visit  Medication Sig Dispense Refill  . zolpidem (AMBIEN) 10 MG tablet Take 1 tablet (10 mg total) by mouth at bedtime as needed.  30 tablet  3    BP 116/80   Pulse 90  Temp(Src) 98.1 F (36.7 C) (Oral)  Resp 18  Wt 223 lb (101.152 kg)        Objective:   Physical Exam  Constitutional: He appears well-developed and well-nourished.  HENT:  Head: Normocephalic and atraumatic.  Right Ear: Tympanic membrane normal.  Left Ear: Tympanic membrane normal.  Nose: Nose normal.  Mouth/Throat: Uvula is midline, oropharynx is clear and moist and mucous membranes are normal. No posterior oropharyngeal erythema.          Assessment & Plan:

## 2010-06-19 NOTE — Patient Instructions (Addendum)
Call if your symptoms worsen or if they do not improve in 48-72 hours.

## 2010-06-20 ENCOUNTER — Encounter: Payer: Self-pay | Admitting: Internal Medicine

## 2010-06-21 DIAGNOSIS — J019 Acute sinusitis, unspecified: Secondary | ICD-10-CM | POA: Insufficient documentation

## 2010-06-21 NOTE — Assessment & Plan Note (Addendum)
Symptoms consistent with sinusitis.  Will treat with biaxin.

## 2010-07-13 ENCOUNTER — Ambulatory Visit: Payer: Self-pay | Admitting: Internal Medicine

## 2010-07-26 ENCOUNTER — Ambulatory Visit: Payer: Self-pay | Admitting: Internal Medicine

## 2010-07-31 ENCOUNTER — Telehealth: Payer: Self-pay | Admitting: Internal Medicine

## 2010-07-31 DIAGNOSIS — G4700 Insomnia, unspecified: Secondary | ICD-10-CM

## 2010-07-31 MED ORDER — ZOLPIDEM TARTRATE 10 MG PO TABS: 10.0000 mg | ORAL_TABLET | Freq: Every evening | ORAL | Status: DC | PRN

## 2010-07-31 NOTE — Telephone Encounter (Signed)
Rx refill called into pharmacy to [facility]

## 2010-07-31 NOTE — Telephone Encounter (Signed)
Ok to RF x 3.--PM

## 2010-07-31 NOTE — Telephone Encounter (Signed)
ZOLPIDEM TARTRATE 10 MG TABLET LAST FILL 07-08-2010 QTY 30 TAKE 1 TABLET BY MOUTN EVERY DAY AT BEDTIME

## 2010-08-02 ENCOUNTER — Telehealth: Payer: Self-pay | Admitting: Internal Medicine

## 2010-08-02 NOTE — Telephone Encounter (Signed)
Refill zolpidem tartrate 10 mg tablet 30 take 1 tablet by mouth every day at bedtime last filled 07-08-2010

## 2010-08-08 NOTE — Telephone Encounter (Signed)
I already authorized RF of this x 3 on 07/31/10.--PM

## 2010-08-09 ENCOUNTER — Ambulatory Visit: Payer: Managed Care, Other (non HMO) | Admitting: Family Medicine

## 2010-08-09 ENCOUNTER — Ambulatory Visit: Payer: Self-pay | Admitting: Internal Medicine

## 2010-08-10 ENCOUNTER — Other Ambulatory Visit: Payer: Self-pay | Admitting: Internal Medicine

## 2010-08-16 ENCOUNTER — Encounter: Payer: Self-pay | Admitting: Internal Medicine

## 2010-08-16 ENCOUNTER — Ambulatory Visit (INDEPENDENT_AMBULATORY_CARE_PROVIDER_SITE_OTHER): Payer: Managed Care, Other (non HMO) | Admitting: Internal Medicine

## 2010-08-16 DIAGNOSIS — N400 Enlarged prostate without lower urinary tract symptoms: Secondary | ICD-10-CM

## 2010-08-16 DIAGNOSIS — E785 Hyperlipidemia, unspecified: Secondary | ICD-10-CM

## 2010-08-16 DIAGNOSIS — R7309 Other abnormal glucose: Secondary | ICD-10-CM

## 2010-08-16 DIAGNOSIS — E119 Type 2 diabetes mellitus without complications: Secondary | ICD-10-CM

## 2010-08-16 MED ORDER — TAMSULOSIN HCL 0.4 MG PO CAPS: 0.4000 mg | ORAL_CAPSULE | ORAL | Status: DC

## 2010-08-17 LAB — CBC WITH DIFFERENTIAL/PLATELET
Basophils Absolute: 0 10*3/uL (ref 0.0–0.1)
Lymphocytes Relative: 45 % (ref 12–46)
Lymphs Abs: 3.3 10*3/uL (ref 0.7–4.0)
Neutro Abs: 3.4 10*3/uL (ref 1.7–7.7)
Neutrophils Relative %: 46 % (ref 43–77)
Platelets: 223 10*3/uL (ref 150–400)
RBC: 4.89 MIL/uL (ref 4.22–5.81)
RDW: 13.8 % (ref 11.5–15.5)
WBC: 7.5 10*3/uL (ref 4.0–10.5)

## 2010-08-17 LAB — BASIC METABOLIC PANEL
CO2: 29 mEq/L (ref 19–32)
Chloride: 104 mEq/L (ref 96–112)
Creat: 0.89 mg/dL (ref 0.50–1.35)
Potassium: 4.8 mEq/L (ref 3.5–5.3)
Sodium: 140 mEq/L (ref 135–145)

## 2010-08-17 LAB — URINALYSIS
Ketones, ur: NEGATIVE mg/dL
Leukocytes, UA: NEGATIVE
Nitrite: NEGATIVE
Specific Gravity, Urine: 1.018 (ref 1.005–1.030)
Urobilinogen, UA: 0.2 mg/dL (ref 0.0–1.0)

## 2010-08-17 LAB — LIPID PANEL
HDL: 47 mg/dL (ref >39)
LDL Cholesterol: 136 mg/dL — ABNORMAL HIGH (ref 0–99)
Total CHOL/HDL Ratio: 4.4 Ratio
Triglycerides: 125 mg/dL (ref <150)
VLDL: 25 mg/dL (ref 0–40)

## 2010-08-17 LAB — HEPATIC FUNCTION PANEL
Albumin: 4.7 g/dL (ref 3.5–5.2)
Alkaline Phosphatase: 50 U/L (ref 39–117)
Total Protein: 6.9 g/dL (ref 6.0–8.3)

## 2010-08-17 LAB — HEMOGLOBIN A1C: Mean Plasma Glucose: 126 mg/dL — ABNORMAL HIGH (ref <117)

## 2010-08-18 DIAGNOSIS — N400 Enlarged prostate without lower urinary tract symptoms: Secondary | ICD-10-CM | POA: Insufficient documentation

## 2010-08-18 NOTE — Progress Notes (Signed)
  Subjective:    Patient ID: Dylan [name], male    DOB: 22-Apr-1950, 60 y.o.   MRN: [id number]  Urinary Frequency    Pt presents to clinic for evaluation of multiple medical problems. Chart review indicates h/o mild DM taking metformin. Not checking fsbs. No polyuria or polydipsia. Notes 3y chronic h/o decreased urinary stream, nocturia 3x/night, feeling of incomplete voiding and urgency. Denies f,c, hematuria or dsyuria. Tolerating statin tx without myalgias or abn lfts. BP reviewed under avg control. No other complaints.  Reviewed pmh, medications and allergies    Review of Systems see HPI     Objective:   Physical Exam  Nursing note and vitals reviewed. Constitutional: He appears well-developed and well-nourished. No distress.  HENT:  Head: Normocephalic and atraumatic.  Left Ear: External ear normal.  Nose: Nose normal.  Eyes: Conjunctivae are normal. No scleral icterus.  Neck: Neck supple. No JVD present.  Cardiovascular: Normal rate, regular rhythm and normal heart sounds.  Exam reveals no gallop and no friction rub.   No murmur heard. Pulmonary/Chest: Effort normal and breath sounds normal. No respiratory distress. He has no wheezes. He has no rales.  Neurological: He is alert.  Skin: Skin is warm and dry. He is not diaphoretic.  Psychiatric: He has a normal mood and affect.          Assessment & Plan:

## 2010-08-18 NOTE — Assessment & Plan Note (Signed)
Obtain flp/lft.  

## 2010-08-18 NOTE — Assessment & Plan Note (Signed)
Stable. Mild. Obtain cbc, chem7, a1c.

## 2010-08-18 NOTE — Assessment & Plan Note (Signed)
Obtain psa. Begin flomax. Follow up if no improvement or worsening.

## 2010-08-21 ENCOUNTER — Other Ambulatory Visit: Payer: Self-pay | Admitting: *Deleted

## 2010-08-21 MED ORDER — PITAVASTATIN CALCIUM 4 MG PO TABS: 4.0000 mg | ORAL_TABLET | Freq: Every day | ORAL | Status: DC

## 2010-08-21 NOTE — Telephone Encounter (Signed)
Rx for Livalo 4 mg sent to pharmacy.

## 2010-09-11 ENCOUNTER — Telehealth: Payer: Self-pay | Admitting: Internal Medicine

## 2010-09-11 DIAGNOSIS — R7309 Other abnormal glucose: Secondary | ICD-10-CM

## 2010-09-11 NOTE — Telephone Encounter (Signed)
Refill-metformin hcl 500mg  tablet. Take one tablet by mouth two times a day. Qty 60. Last fill 6.15.12

## 2010-09-12 MED ORDER — METFORMIN HCL 500 MG PO TABS: 500.0000 mg | ORAL_TABLET | Freq: Two times a day (BID) | ORAL | Status: DC

## 2010-09-12 NOTE — Telephone Encounter (Signed)
Last seen on 08/16/10.  Follow up scheduled for 02/14/11.  RX for 6 months sent to pharm.

## 2010-10-15 ENCOUNTER — Telehealth: Payer: Self-pay | Admitting: Internal Medicine

## 2010-10-15 DIAGNOSIS — G4700 Insomnia, unspecified: Secondary | ICD-10-CM

## 2010-10-15 NOTE — Telephone Encounter (Signed)
Refill- zolpidem tartrate 10mg  tablet. Take one tablet by mouth at bedtime. Qty 30. Last fill 7.29.12

## 2010-10-16 MED ORDER — ZOLPIDEM TARTRATE 10 MG PO TABS: 10.0000 mg | ORAL_TABLET | Freq: Every evening | ORAL | Status: DC | PRN

## 2010-10-16 NOTE — Telephone Encounter (Signed)
[phone], spoke with pharmacist Ray. He did verify that patient did refill on 7/29 and is not currently due. However there are no remaining refills on file for this medication.

## 2010-10-16 NOTE — Telephone Encounter (Signed)
Is it okay to refill Ambien for this medication

## 2010-10-16 NOTE — Telephone Encounter (Signed)
#  30 rf1 8/28

## 2010-10-16 NOTE — Telephone Encounter (Signed)
[facility]  [phone], spoke with pharmacist Ray. He was provided a verbal refill for Zolipidem 10 mg RF 1 to be filled on or after 10/23/2010

## 2010-10-16 NOTE — Telephone Encounter (Signed)
If filled 7/29 then shouldn't be out

## 2010-10-26 ENCOUNTER — Ambulatory Visit (HOSPITAL_BASED_OUTPATIENT_CLINIC_OR_DEPARTMENT_OTHER)
Admission: RE | Admit: 2010-10-26 | Discharge: 2010-10-26 | Disposition: A | Discharge status: Home / Self Care | Payer: Managed Care, Other (non HMO) | Source: Ambulatory Visit | Attending: Specialist | Admitting: Specialist

## 2010-10-26 DIAGNOSIS — M25569 Pain in unspecified knee: Secondary | ICD-10-CM | POA: Insufficient documentation

## 2010-10-26 DIAGNOSIS — M1710 Unilateral primary osteoarthritis, unspecified knee: Secondary | ICD-10-CM | POA: Insufficient documentation

## 2010-10-26 DIAGNOSIS — Z01810 Encounter for preprocedural cardiovascular examination: Secondary | ICD-10-CM | POA: Insufficient documentation

## 2010-10-26 DIAGNOSIS — M23305 Other meniscus derangements, unspecified medial meniscus, unspecified knee: Secondary | ICD-10-CM | POA: Insufficient documentation

## 2010-10-26 DIAGNOSIS — Z01812 Encounter for preprocedural laboratory examination: Secondary | ICD-10-CM | POA: Insufficient documentation

## 2010-10-26 LAB — POCT I-STAT 4, (NA,K, GLUC, HGB,HCT)
Glucose, Bld: 134 mg/dL — ABNORMAL HIGH (ref 70–99)
HCT: 44 % (ref 39.0–52.0)

## 2010-10-26 LAB — GLUCOSE, CAPILLARY: Glucose-Capillary: 118 mg/dL — ABNORMAL HIGH (ref 70–99)

## 2010-11-05 NOTE — Op Note (Signed)
  Dylan [name], WOO NO.:  [id number]  MEDICAL RECORD NO.:  [id number]  LOCATION:                                 FACILITY:  PHYSICIAN:  Jene Every, M.D.    DATE OF BIRTH:  10/05/1950  DATE OF PROCEDURE:  10/26/2010 DATE OF DISCHARGE:                              OPERATIVE REPORT   PREOPERATIVE DIAGNOSIS:  Degenerative joint disease, degenerative meniscal tear, left knee.  POSTOPERATIVE DIAGNOSIS:  Degenerative joint disease, degenerative meniscal tear, left knee.  PROCEDURES PERFORMED: 1. Left knee arthroscopy. 2. Partial medial meniscectomy. 3. Chondroplasty of patellofemoral joint, medial compartment, lateral     compartment.  ANESTHESIA:  General.  ASSISTANT:  None.  BRIEF HISTORY:  60 year old with knee pain, refractory osteoarthrosis, persistent knee pain, locking, giving way, history of arthroscopy in the past is indicative of rather total knee or an arthroscopy.  We chose to proceed with the arthroscopy in an efforts to delay the total knee. Risks and benefits discussed, including bleeding, infection, damage to surrounding structures, no change in symptoms, worsening symptoms, need for repeat debridement, DVT, PE, anesthetic complications, etc.  TECHNIQUE:  The patient in supine position, after induction of adequate general anesthesia and 1 gram vancomycin, the left lower extremity was prepped and draped in the usual sterile fashion.  A lateral parapatellar portal was fashioned with a #11 blade.  Ingress cannula atraumatically placed.  Irrigant was utilized and insufflated the joint. Under direct visualization, the medial parapatellar portal was fashioned with a #11 blade after localization with 18-gauge needle, sparing the medial meniscus.  Noted was extensive grade 3 changes in the medial compartment, femoral condyle, tibial plateau, degenerative tearing of the meniscus from the midportion posteriorly.  I introduced a 4.2 Cuda shaver  and performed a chondroplasty of the femoral condyle, tibia, and performed a partial meniscectomy to a stable base of the posterior half of the medial meniscus.  Loose cartilaginous bodies were evacuated as well.  Remnant of the meniscus was stable to probe palpation.  ACL was unremarkable.  Lateral compartment revealed grade 3 changes in femoral condyle, tibial plateau, and meniscus.  Chondroplasty performed of all.  Meniscus was stable to probe palpation without evidence of significant displaced tearing.  I shaved the medial aspect due to some degenerative fray in the meniscus.  Suprapatellar pouch, extensive grade 3 changes, minor grade 4 changes at patellofemoral joint.  Chondroplasty performed here of the sulcus and of the patella minor, light chondroplasty.  Gutters were unremarkable. There was no patellofemoral tracking.  I reexamined all compartments.  No further pathology was amenable arthroscopic intervention, therefore removed all instrumentation. Portals were closed with 4 nylon simple sutures.  0.25% Marcaine with epinephrine was infiltrated into the joint.  Wound dressed sterilely. Awoken without difficulty, and transported to recovery in satisfactory condition.  The patient tolerated procedure well.  No complications.     Jene Every, M.D.     Cordelia Pen  D:  10/26/2010  T:  10/27/2010  Job:  [id number]  Electronically Signed by Jene Every M.D. on 11/05/2010 03:05:53 PM

## 2010-12-17 ENCOUNTER — Telehealth: Payer: Self-pay | Admitting: Internal Medicine

## 2010-12-17 DIAGNOSIS — G4700 Insomnia, unspecified: Secondary | ICD-10-CM

## 2010-12-17 NOTE — Telephone Encounter (Signed)
Refill- zolpidem tartrate 10mg  tablet. Take one tablet by mouth at bedtime. Qty 30. Last fill 10.1.12

## 2010-12-17 NOTE — Telephone Encounter (Signed)
Ok to refill x 2  

## 2010-12-18 ENCOUNTER — Telehealth: Payer: Self-pay | Admitting: Internal Medicine

## 2010-12-18 DIAGNOSIS — R7309 Other abnormal glucose: Secondary | ICD-10-CM

## 2010-12-18 DIAGNOSIS — E785 Hyperlipidemia, unspecified: Secondary | ICD-10-CM

## 2010-12-18 NOTE — Telephone Encounter (Signed)
Cindy please put lab order for this pt. Lfts,lipids, bmet 272.4, alc 790.29

## 2010-12-18 NOTE — Telephone Encounter (Signed)
Pt is sch for labs on 02-08-11 8am

## 2010-12-18 NOTE — Telephone Encounter (Signed)
lmom for pt wife to return call

## 2010-12-18 NOTE — Telephone Encounter (Signed)
I suggest lipid and LFTs - 272.4 Bmet,  A1c - 790.29

## 2010-12-18 NOTE — Telephone Encounter (Signed)
Pt is due for chole etc labwork per wife. Pt is on livalo. What labs can I sch ?

## 2010-12-20 NOTE — Telephone Encounter (Signed)
Future orders put in

## 2010-12-21 MED ORDER — ZOLPIDEM TARTRATE 10 MG PO TABS: 10.0000 mg | ORAL_TABLET | Freq: Every evening | ORAL | Status: DC | PRN

## 2010-12-21 NOTE — Telephone Encounter (Signed)
rx called in

## 2010-12-24 ENCOUNTER — Other Ambulatory Visit: Payer: Self-pay | Admitting: *Deleted

## 2010-12-24 DIAGNOSIS — G4700 Insomnia, unspecified: Secondary | ICD-10-CM

## 2010-12-24 MED ORDER — ZOLPIDEM TARTRATE 10 MG PO TABS: 10.0000 mg | ORAL_TABLET | Freq: Every evening | ORAL | Status: DC | PRN

## 2010-12-24 NOTE — Telephone Encounter (Signed)
Changed from 30 days to #90 no refills.  Pt will need OV before anymore given per Dr Artist Pais

## 2011-01-02 ENCOUNTER — Ambulatory Visit: Payer: Managed Care, Other (non HMO) | Admitting: Internal Medicine

## 2011-01-11 ENCOUNTER — Other Ambulatory Visit: Payer: Self-pay | Admitting: Internal Medicine

## 2011-01-11 NOTE — Telephone Encounter (Signed)
3 mth supply sent in electronically for Livalo

## 2011-02-08 ENCOUNTER — Other Ambulatory Visit: Payer: Managed Care, Other (non HMO)

## 2011-02-11 ENCOUNTER — Other Ambulatory Visit (INDEPENDENT_AMBULATORY_CARE_PROVIDER_SITE_OTHER): Payer: Managed Care, Other (non HMO)

## 2011-02-11 DIAGNOSIS — R7309 Other abnormal glucose: Secondary | ICD-10-CM

## 2011-02-11 DIAGNOSIS — E785 Hyperlipidemia, unspecified: Secondary | ICD-10-CM

## 2011-02-11 LAB — BASIC METABOLIC PANEL
Calcium: 9.1 mg/dL (ref 8.4–10.5)
GFR: 81.26 mL/min (ref >60.00)
Sodium: 141 mEq/L (ref 135–145)

## 2011-02-11 LAB — LIPID PANEL
HDL: 49.4 mg/dL (ref >39.00)
Total CHOL/HDL Ratio: 4
Triglycerides: 200 mg/dL — ABNORMAL HIGH (ref 0.0–149.0)

## 2011-02-11 LAB — HEPATIC FUNCTION PANEL
ALT: 49 U/L (ref 0–53)
AST: 27 U/L (ref 0–37)
Albumin: 3.9 g/dL (ref 3.5–5.2)

## 2011-02-14 ENCOUNTER — Ambulatory Visit: Payer: Managed Care, Other (non HMO) | Admitting: Internal Medicine

## 2011-02-15 ENCOUNTER — Encounter: Payer: Self-pay | Admitting: Internal Medicine

## 2011-02-15 ENCOUNTER — Ambulatory Visit (INDEPENDENT_AMBULATORY_CARE_PROVIDER_SITE_OTHER): Payer: Managed Care, Other (non HMO) | Admitting: Internal Medicine

## 2011-02-15 DIAGNOSIS — G4700 Insomnia, unspecified: Secondary | ICD-10-CM

## 2011-02-15 DIAGNOSIS — R7309 Other abnormal glucose: Secondary | ICD-10-CM

## 2011-02-15 DIAGNOSIS — N400 Enlarged prostate without lower urinary tract symptoms: Secondary | ICD-10-CM

## 2011-02-15 DIAGNOSIS — E785 Hyperlipidemia, unspecified: Secondary | ICD-10-CM

## 2011-02-15 MED ORDER — METFORMIN HCL 500 MG PO TABS: 500.0000 mg | ORAL_TABLET | Freq: Two times a day (BID) | ORAL | Status: DC

## 2011-02-15 MED ORDER — TAMSULOSIN HCL 0.4 MG PO CAPS: 0.4000 mg | ORAL_CAPSULE | ORAL | Status: DC

## 2011-02-15 MED ORDER — FINASTERIDE 5 MG PO TABS: 5.0000 mg | ORAL_TABLET | Freq: Every day | ORAL | Status: DC

## 2011-02-15 MED ORDER — PITAVASTATIN CALCIUM 4 MG PO TABS: 4.0000 mg | ORAL_TABLET | Freq: Every day | ORAL | Status: DC

## 2011-02-15 MED ORDER — ZOLPIDEM TARTRATE 10 MG PO TABS: 10.0000 mg | ORAL_TABLET | Freq: Every evening | ORAL | Status: DC | PRN

## 2011-02-15 NOTE — Assessment & Plan Note (Signed)
Unchanged.  Continue zolpidem as needed. 

## 2011-02-15 NOTE — Assessment & Plan Note (Signed)
LDL is well controlled at less than 100. Triglycerides are elevated.  This is likely as a consequence of borderline type 2 diabetes. Recommended exercise and over-the-counter fish oil for now.  Lab Results  Component Value Date   CHOL 181 02/11/2011   HDL 49.40 02/11/2011   LDLCALC 92 02/11/2011   TRIG 200.0* 02/11/2011   CHOLHDL 4 02/11/2011   Lab Results  Component Value Date   ALT 49 02/11/2011   AST 27 02/11/2011   ALKPHOS 49 02/11/2011   BILITOT 0.5 02/11/2011

## 2011-02-15 NOTE — Patient Instructions (Addendum)
Please complete the following lab tests before your next follow up appointment: BMET, A1c - 790.20 PSA - 600.9

## 2011-02-15 NOTE — Assessment & Plan Note (Signed)
Slightly worse.  Continue metformin 500 mg twice daily. Encourage regular exercise and reduce carbohydrate diet. Monitor A1c before next office visit.

## 2011-02-15 NOTE — Assessment & Plan Note (Signed)
Patient started on Flomax in June of 2012 for BPH symptoms. He had initial improvement but now efficacy has waned. I suggested adding finasteride 5 mg once daily. We discussed common side effects.

## 2011-02-15 NOTE — Progress Notes (Signed)
  Subjective:    Patient ID: Dylan [name], male    DOB: 12-17-1950, 60 y.o.   MRN: [id number]  HPI  60 year old white male with history of borderline type 2 diabetes, hyperlipidemia and BPH for routine followup. Overall patient has been doing well. He was able to lose a significant amount of weight with lifestyle changes. However over the last 2 or 3 months he has experienced increased life stressors. His wife continues to deteriorate with advanced breast cancer. He also has increased stressors at work.  He has been compliant with taking cholesterol medication regularly.  Lab results reviewed in detail.  Her previous office visits Dr. Rodena Medin and started patient on Flomax for BPH symptoms. His symptoms improved for the first 2 or 3 months but efficacy has waned.  Review of Systems Positive for weight gain,   Negative for chest pain  Past Medical History  Diagnosis Date  . Allergy     allergic rhinitis  . Hyperlipidemia   . Headache   . Hepatitis     history of infections Hepatitis  . History of ulcerative colitis   . History of melanoma     face and forehead-- previously [place] by Vaughan Sine in [place]    History   Social History  . Marital Status: Married    Spouse Name: N/A    Number of Children: N/A  . Years of Education: N/A   Occupational History  . Not on file.   Social History Main Topics  . Smoking status: Never Smoker   . Smokeless tobacco: Former Neurosurgeon    Quit date: 02/26/2003  . Alcohol Use: Yes  . Drug Use: Not on file  . Sexually Active: Not on file   Other Topics Concern  . Not on file   Social History Narrative   Wife works at cath lab at [employer] advanced breast cancer    Past Surgical History  Procedure Date  . Tonsillectomy 1969  . Appendectomy 1969  . Knee surgery     right knee    Family History  Problem Relation Age of Onset  . Cancer Brother     brain tumor  . Diabetes Other   . Cancer Other     ovarian    Allergies  Allergen  Reactions  . Heparin     REACTION: breathing problems  . Penicillins     REACTION: Rash  . Succinylcholine Chloride     REACTION: does not wake up from anesthetic    Current Outpatient Prescriptions on File Prior to Visit  Medication Sig Dispense Refill  . DISCONTD: metFORMIN (GLUCOPHAGE) 500 MG tablet Take 1 tablet (500 mg total) by mouth 2 (two) times daily with a meal.  60 tablet  5    BP 134/82  Pulse 92  Temp(Src) 98.5 F (36.9 C) (Oral)  Wt 236 lb (107.049 kg)     Objective:   Physical Exam   Constitutional: Appears well-developed and well-nourished. No distress.  Head: Normocephalic and atraumatic.  Neck: Normal range of motion. Neck supple. No thyromegaly present. No carotid bruit Cardiovascular: Normal rate, regular rhythm and normal heart sounds.  Exam reveals no gallop and no friction rub.  No murmur heard. Pulmonary/Chest: Effort normal and breath sounds normal.  No wheezes. No rales.  Neurological: Alert. No cranial nerve deficit.  Skin: Skin is warm and dry.  Psychiatric: Normal mood and affect. Behavior is normal.      Assessment & Plan:

## 2011-03-12 ENCOUNTER — Other Ambulatory Visit: Payer: Self-pay | Admitting: *Deleted

## 2011-03-12 DIAGNOSIS — R7309 Other abnormal glucose: Secondary | ICD-10-CM

## 2011-05-10 ENCOUNTER — Ambulatory Visit (INDEPENDENT_AMBULATORY_CARE_PROVIDER_SITE_OTHER): Payer: Managed Care, Other (non HMO) | Admitting: Internal Medicine

## 2011-05-10 ENCOUNTER — Encounter: Payer: Self-pay | Admitting: Internal Medicine

## 2011-05-10 ENCOUNTER — Ambulatory Visit (INDEPENDENT_AMBULATORY_CARE_PROVIDER_SITE_OTHER)
Admission: RE | Admit: 2011-05-10 | Discharge: 2011-05-10 | Disposition: A | Discharge status: Home / Self Care | Payer: Managed Care, Other (non HMO) | Source: Ambulatory Visit | Attending: Internal Medicine | Admitting: Internal Medicine

## 2011-05-10 VITALS — BP 154/100 | HR 92 | Temp 98.3°F | Ht 72.0 in | Wt 237.0 lb

## 2011-05-10 DIAGNOSIS — M542 Cervicalgia: Secondary | ICD-10-CM

## 2011-05-10 DIAGNOSIS — N419 Inflammatory disease of prostate, unspecified: Secondary | ICD-10-CM | POA: Insufficient documentation

## 2011-05-10 DIAGNOSIS — R300 Dysuria: Secondary | ICD-10-CM

## 2011-05-10 DIAGNOSIS — I10 Essential (primary) hypertension: Secondary | ICD-10-CM

## 2011-05-10 LAB — POCT URINALYSIS DIPSTICK
Blood, UA: NEGATIVE
Glucose, UA: NEGATIVE
Nitrite, UA: NEGATIVE
Protein, UA: NEGATIVE
Urobilinogen, UA: 0.2

## 2011-05-10 MED ORDER — CYCLOBENZAPRINE HCL 5 MG PO TABS: 5.0000 mg | ORAL_TABLET | Freq: Every evening | ORAL | Status: DC | PRN

## 2011-05-10 MED ORDER — LOSARTAN POTASSIUM 50 MG PO TABS: 50.0000 mg | ORAL_TABLET | Freq: Every day | ORAL | Status: DC

## 2011-05-10 MED ORDER — CIPROFLOXACIN HCL 500 MG PO TABS: 500.0000 mg | ORAL_TABLET | Freq: Two times a day (BID) | ORAL | Status: AC

## 2011-05-10 NOTE — Progress Notes (Signed)
Subjective:    Patient ID: Dylan [name], male    DOB: 09-11-1950, 61 y.o.   MRN: [id number]  HPI  61 year old white male with a borderline diabetes and hyperlipidemia complains of chronic neck pain for 2 months. He does not recall specific trigger. His symptoms worse with lying down. He describes sensation as a sharp stabbing pain. It does not radiate to his left arm but does slightly radiate to upper shoulder area. He has remote traumatic injury 15 years ago while he was playing roller hockey.  He denies upper extremity weakness.  Patient also complains of dysuria, left groin discomfort and testicular soreness on the left side. GU symptoms have been ongoing times 1 to 2 weeks. He denies fever or flank pain.  Patient has also noticed elevated BP readings at home.   Review of Systems    negative for chest pain or shortness of breath  Past Medical History  Diagnosis Date  . Allergy     allergic rhinitis  . Hyperlipidemia   . Headache   . Hepatitis     history of infections Hepatitis  . History of ulcerative colitis   . History of melanoma     face and forehead-- previously followed by Vaughan Sine in [place]    History   Social History  . Marital Status: Married    Spouse Name: N/A    Number of Children: N/A  . Years of Education: N/A   Occupational History  . Not on file.   Social History Main Topics  . Smoking status: Never Smoker   . Smokeless tobacco: Former Neurosurgeon    Quit date: 02/26/2003  . Alcohol Use: Yes  . Drug Use: Not on file  . Sexually Active: Not on file   Other Topics Concern  . Not on file   Social History Narrative   Wife works at cath lab at [employer] advanced breast cancer    Past Surgical History  Procedure Date  . Tonsillectomy 1969  . Appendectomy 1969  . Knee surgery     right knee    Family History  Problem Relation Age of Onset  . Cancer Brother     brain tumor  . Diabetes Other   . Cancer Other     ovarian    Allergies    Allergen Reactions  . Heparin     REACTION: breathing problems  . Penicillins     REACTION: Rash  . Succinylcholine Chloride     REACTION: does not wake up from anesthetic    Current Outpatient Prescriptions on File Prior to Visit  Medication Sig Dispense Refill  . finasteride (PROSCAR) 5 MG tablet Take 1 tablet (5 mg total) by mouth daily.  90 tablet  1  . metFORMIN (GLUCOPHAGE) 500 MG tablet Take 1 tablet (500 mg total) by mouth 2 (two) times daily with a meal.  180 tablet  1  . Pitavastatin Calcium (LIVALO) 4 MG TABS Take 1 tablet (4 mg total) by mouth daily.  90 tablet  1  . Tamsulosin HCl (FLOMAX) 0.4 MG CAPS Take 1 capsule (0.4 mg total) by mouth daily after supper.  90 capsule  1  . zolpidem (AMBIEN) 10 MG tablet Take 1 tablet (10 mg total) by mouth at bedtime as needed.  90 tablet  1    BP 154/100  Pulse 92  Temp(Src) 98.3 F (36.8 C) (Oral)  Ht 6' (1.829 m)  Wt 237 lb (107.502 kg)  BMI 32.14 kg/m2    Objective:  Physical Exam  Constitutional: He appears well-developed and well-nourished.  HENT:  Head: Normocephalic and atraumatic.  Right Ear: External ear normal.  Left Ear: External ear normal.  Neck:       Mild decrease in range of motion of cervical spine sidebending and rotation. Mild discomfort with cervical flexion.  Cardiovascular: Normal rate, regular rhythm and normal heart sounds.   Pulmonary/Chest: Effort normal and breath sounds normal.  Abdominal: Soft. Bowel sounds are normal. He exhibits no distension and no mass.  Genitourinary: Guaiac negative stool.        Prostate is slightly enlarged boggy and tender Mild left testicular tenderness. No testicular mass.  Skin: Skin is warm and dry.  Psychiatric: He has a normal mood and affect. His behavior is normal.       Assessment & Plan:

## 2011-05-10 NOTE — Assessment & Plan Note (Signed)
Patient experiencing groin pain, dysuria and left testicular soreness. Patient has tender boggy prostate on exam. Treat with Cipro 500 mg twice a day x10 days.  Patient advised to call office if symptoms persist or worsen.

## 2011-05-10 NOTE — Assessment & Plan Note (Signed)
Patient has noticed multiple elevated BP readings at home.  Start ARB.  Goal BP < 130/80. BP: 154/100 mmHg

## 2011-05-10 NOTE — Assessment & Plan Note (Signed)
61 year old white male with chronic neck pain x2 months. No precipitating injury. Considering chronic nature of symptoms obtain cervical x-ray. Trial of muscle relaxers for now. Patient also encouraged to seek massage therapy.

## 2011-05-13 NOTE — Progress Notes (Signed)
Quick Note:  Pt aware ______ 

## 2011-05-17 ENCOUNTER — Ambulatory Visit: Payer: Managed Care, Other (non HMO) | Admitting: Internal Medicine

## 2011-05-29 ENCOUNTER — Other Ambulatory Visit: Payer: Self-pay | Admitting: Internal Medicine

## 2011-05-29 ENCOUNTER — Encounter: Payer: Self-pay | Admitting: Internal Medicine

## 2011-05-29 ENCOUNTER — Ambulatory Visit (INDEPENDENT_AMBULATORY_CARE_PROVIDER_SITE_OTHER): Payer: Managed Care, Other (non HMO) | Admitting: Internal Medicine

## 2011-05-29 VITALS — BP 140/88 | Temp 98.8°F | Ht 72.0 in | Wt 235.0 lb

## 2011-05-29 DIAGNOSIS — N419 Inflammatory disease of prostate, unspecified: Secondary | ICD-10-CM

## 2011-05-29 DIAGNOSIS — R300 Dysuria: Secondary | ICD-10-CM

## 2011-05-29 DIAGNOSIS — I10 Essential (primary) hypertension: Secondary | ICD-10-CM

## 2011-05-29 LAB — POCT URINALYSIS DIPSTICK
Bilirubin, UA: NEGATIVE
Blood, UA: NEGATIVE
Glucose, UA: NEGATIVE
Ketones, UA: NEGATIVE
Leukocytes, UA: NEGATIVE
Nitrite, UA: NEGATIVE
Spec Grav, UA: 1.02
Urobilinogen, UA: 0.2
pH, UA: 6.5

## 2011-05-29 MED ORDER — LEVOFLOXACIN 500 MG PO TABS: 500.0000 mg | ORAL_TABLET | Freq: Every day | ORAL | Status: AC

## 2011-05-29 MED ORDER — LOSARTAN POTASSIUM 50 MG PO TABS: 50.0000 mg | ORAL_TABLET | Freq: Two times a day (BID) | ORAL | Status: DC

## 2011-05-29 NOTE — Progress Notes (Signed)
Subjective:    Patient ID: Dylan [name], male    DOB: [birth year], 61 y.o.   MRN: [id number]  HPI  61 year old white male previously seen for symptoms of dysuria and left groin discomfort for followup. Patient thought to have possible prostatitis.  He was empirically treated with Cipro 500 mg twice daily. Despite taking antibiotics patient complains of persistent dysuria and left groin discomfort. He denies fever or chills.  Hypertension-patient tolerating Cozaar without any difficulty. There has been mild decrease in blood pressure Review of Systems See HPI  Past Medical History  Diagnosis Date  . Allergy     allergic rhinitis  . Hyperlipidemia   . Headache   . Hepatitis     history of infections Hepatitis  . History of ulcerative colitis   . History of melanoma     face and forehead-- previously followed by Vaughan Sine in [place]    History   Social History  . Marital Status: Married    Spouse Name: N/A    Number of Children: N/A  . Years of Education: N/A   Occupational History  . Not on file.   Social History Main Topics  . Smoking status: Never Smoker   . Smokeless tobacco: Former Neurosurgeon    Quit date: 02/26/2003  . Alcohol Use: Yes  . Drug Use: Not on file  . Sexually Active: Not on file   Other Topics Concern  . Not on file   Social History Narrative   Wife works at cath lab at [employer] advanced breast cancer    Past Surgical History  Procedure Date  . Tonsillectomy 1969  . Appendectomy 1969  . Knee surgery     right knee    Family History  Problem Relation Age of Onset  . Cancer Brother     brain tumor  . Diabetes Other   . Cancer Other     ovarian    Allergies  Allergen Reactions  . Heparin     REACTION: breathing problems  . Penicillins     REACTION: Rash  . Succinylcholine Chloride     REACTION: does not wake up from anesthetic    Current Outpatient Prescriptions on File Prior to Visit  Medication Sig Dispense Refill  . finasteride  (PROSCAR) 5 MG tablet Take 1 tablet (5 mg total) by mouth daily.  90 tablet  1  . metFORMIN (GLUCOPHAGE) 500 MG tablet Take 1 tablet (500 mg total) by mouth 2 (two) times daily with a meal.  180 tablet  1  . Pitavastatin Calcium (LIVALO) 4 MG TABS Take 1 tablet (4 mg total) by mouth daily.  90 tablet  1  . Tamsulosin HCl (FLOMAX) 0.4 MG CAPS Take 1 capsule (0.4 mg total) by mouth daily after supper.  90 capsule  1  . zolpidem (AMBIEN) 10 MG tablet Take 1 tablet (10 mg total) by mouth at bedtime as needed.  90 tablet  1  . DISCONTD: losartan (COZAAR) 50 MG tablet Take 1 tablet (50 mg total) by mouth daily.  30 tablet  1    BP 140/88  Temp(Src) 98.8 F (37.1 C) (Oral)  Ht 6' (1.829 m)  Wt 235 lb (106.595 kg)  BMI 31.87 kg/m2       Objective:   Physical Exam  Constitutional: He appears well-developed and well-nourished. No distress.  Cardiovascular: Normal rate, regular rhythm and normal heart sounds.   Pulmonary/Chest: Effort normal and breath sounds normal. He has no wheezes. He has no rales.  Abdominal: Soft. Bowel sounds are normal.  Skin: Skin is warm and dry.       Assessment & Plan:

## 2011-05-29 NOTE — Patient Instructions (Signed)
Our office will contact you re: urology referral 

## 2011-05-29 NOTE — Assessment & Plan Note (Signed)
Blood pressure is improved but still suboptimal. Increase losartan 50 mg twice a day. BP: 140/88 mmHg

## 2011-05-29 NOTE — Assessment & Plan Note (Signed)
Patient with persistent dysuria and groin discomfort despite taking course of Cipro 500 mg twice a day. On previous exam patient had boggy tender prostate. I still suspect patient has prostatitis. Change to Levaquin 500 mg once daily. Refer to urology for further evaluation.

## 2011-05-31 LAB — URINE CULTURE: Colony Count: NO GROWTH

## 2011-07-13 ENCOUNTER — Other Ambulatory Visit: Payer: Self-pay | Admitting: Internal Medicine

## 2011-08-05 ENCOUNTER — Other Ambulatory Visit: Payer: Self-pay | Admitting: *Deleted

## 2011-08-05 DIAGNOSIS — G4700 Insomnia, unspecified: Secondary | ICD-10-CM

## 2011-08-05 MED ORDER — ZOLPIDEM TARTRATE 10 MG PO TABS: 10.0000 mg | ORAL_TABLET | Freq: Every evening | ORAL | Status: DC | PRN

## 2011-08-08 ENCOUNTER — Other Ambulatory Visit: Payer: Self-pay | Admitting: Internal Medicine

## 2011-08-09 ENCOUNTER — Ambulatory Visit (INDEPENDENT_AMBULATORY_CARE_PROVIDER_SITE_OTHER): Payer: Managed Care, Other (non HMO) | Admitting: Internal Medicine

## 2011-08-09 ENCOUNTER — Encounter: Payer: Self-pay | Admitting: Internal Medicine

## 2011-08-09 VITALS — BP 164/90 | Temp 98.5°F | Wt 233.0 lb

## 2011-08-09 DIAGNOSIS — N419 Inflammatory disease of prostate, unspecified: Secondary | ICD-10-CM

## 2011-08-09 DIAGNOSIS — M542 Cervicalgia: Secondary | ICD-10-CM

## 2011-08-09 DIAGNOSIS — I10 Essential (primary) hypertension: Secondary | ICD-10-CM

## 2011-08-09 MED ORDER — TRAMADOL HCL 50 MG PO TABS: 50.0000 mg | ORAL_TABLET | Freq: Two times a day (BID) | ORAL | Status: AC | PRN

## 2011-08-09 MED ORDER — AMLODIPINE BESYLATE 5 MG PO TABS: 5.0000 mg | ORAL_TABLET | Freq: Every day | ORAL | Status: DC

## 2011-08-09 MED ORDER — VALSARTAN 160 MG PO TABS: 160.0000 mg | ORAL_TABLET | Freq: Every day | ORAL | Status: DC

## 2011-08-09 NOTE — Assessment & Plan Note (Signed)
For blood pressure control despite higher dose of losartan. Discontinue valsartan. Switch to valsartan 160 mg once daily. Add amlodipine 5 mg daily

## 2011-08-09 NOTE — Patient Instructions (Addendum)
Our office will contact you re:  MRI of Cervical spine results

## 2011-08-09 NOTE — Assessment & Plan Note (Signed)
Patient seen by urology.   His has persistent symptoms.  He is currently taking trial of anticholinergics. Followup with urology.

## 2011-08-09 NOTE — Assessment & Plan Note (Signed)
No improvement with conservative therapy. Cervical x-ray abnormal. Patient is having radicular symptoms down his right arm. Question nerve impingement/radiculopathy. Obtain MRI of cervical spine. Discussed referral to neurosurgeon. He is using Aleve over-the-counter intermittently. Patient cautioned regarding NSAID use with blood pressure medication. Use tramadol 50 mg twice daily as needed. Patient also cautioned not to use tramadol within 2-4 hours of using zolpidem.

## 2011-08-09 NOTE — Progress Notes (Signed)
Subjective:    Patient ID: Dylan [name], male    DOB: 05/25/1950, 61 y.o.   MRN: [id number]  HPI  A 61 year old white male previously seen for possible prostatitis and neck pain for followup. He was seen by urologists. Flomax was discontinued and he is currently taking trial of anticholinergics. No significant change in bladder symptoms.  Patient complains of persistent neck pain. It has gotten significantly worse. Pain is radiating into his right arm. Previous cervical x-rays were reviewed in detail with patient.  04/2011 No prevertebral soft tissue swelling is evident.  Intervertebral disc spaces are maintained. There is nuchal  ligament calcification posteriorly at the level of C5 and C6.  There is marginal osteophyte formation at multiple levels with the  largest osteophytes anteriorly at the levels of C2-C3 and C3-C4.  There is 2 mm of posterior subluxation of the body of C4 on the  body of C5. No fracture or bony destruction is evident. There is  foraminal encroachment by uncovertebral spurring at the level of C4-  C5 on the right no cervical ribs are evident. There is apophyseal  joint degenerative spondylosis at C4-C5 more on the right.  Hypertension - no improvement since increasing losartan dose to bid.  Review of Systems He denies weakness in his right arm but he is experiencing tingling sensation in his whole arm  Past Medical History  Diagnosis Date  . Allergy     allergic rhinitis  . Hyperlipidemia   . Headache   . Hepatitis     history of infections Hepatitis  . History of ulcerative colitis   . History of melanoma     face and forehead-- previously followed by Vaughan Sine in [place]    History   Social History  . Marital Status: Married    Spouse Name: N/A    Number of Children: N/A  . Years of Education: N/A   Occupational History  . Not on file.   Social History Main Topics  . Smoking status: Never Smoker   . Smokeless tobacco: Former Neurosurgeon    Quit  date: 02/26/2003  . Alcohol Use: Yes  . Drug Use: Not on file  . Sexually Active: Not on file   Other Topics Concern  . Not on file   Social History Narrative   Wife works at cath lab at [employer] advanced breast cancer    Past Surgical History  Procedure Date  . Tonsillectomy 1969  . Appendectomy 1969  . Knee surgery     right knee    Family History  Problem Relation Age of Onset  . Cancer Brother     brain tumor  . Diabetes Other   . Cancer Other     ovarian    Allergies  Allergen Reactions  . Heparin     REACTION: breathing problems  . Penicillins     REACTION: Rash  . Succinylcholine Chloride     REACTION: does not wake up from anesthetic    Current Outpatient Prescriptions on File Prior to Visit  Medication Sig Dispense Refill  . cyclobenzaprine (FLEXERIL) 5 MG tablet TAKE 1 TABLET BY MOUTH AT BEDTIME AS NEEDED FOR MUSCLE SPASMS  30 tablet  0  . HYDROcodone-acetaminophen (NORCO) 5-325 MG per tablet Take 1 tablet by mouth daily.      . metFORMIN (GLUCOPHAGE) 500 MG tablet Take 1 tablet (500 mg total) by mouth 2 (two) times daily with a meal.  180 tablet  1  . Pitavastatin Calcium (LIVALO) 4  MG TABS Take 1 tablet (4 mg total) by mouth daily.  90 tablet  1  . zolpidem (AMBIEN) 10 MG tablet Take 1 tablet (10 mg total) by mouth at bedtime as needed.  30 tablet  5  . amLODipine (NORVASC) 5 MG tablet Take 1 tablet (5 mg total) by mouth daily.  30 tablet  3  . valsartan (DIOVAN) 160 MG tablet Take 1 tablet (160 mg total) by mouth daily.  30 tablet  3  . DISCONTD: LIVALO 4 MG TABS TAKE 1 TABLET BY MOUTH DAILY.  30 tablet  3    BP 164/90  Temp 98.5 F (36.9 C) (Oral)  Wt 233 lb (105.688 kg)       Objective:   Physical Exam  Constitutional: He is oriented to person, place, and time. He appears well-developed and well-nourished.  HENT:  Head: Normocephalic and atraumatic.  Neck:       Limited ROM of cervical spine.  Pain worse with extension and sidebending    Cardiovascular: Normal rate, regular rhythm and normal heart sounds.   Pulmonary/Chest: Effort normal and breath sounds normal.  Musculoskeletal: He exhibits no edema.       Upper extremity muscle strength intact  Neurological: He is alert and oriented to person, place, and time. He exhibits normal muscle tone.  Skin: Skin is warm and dry.  Psychiatric: He has a normal mood and affect. His behavior is normal.          Assessment & Plan:

## 2011-08-15 ENCOUNTER — Other Ambulatory Visit: Payer: Self-pay | Admitting: Internal Medicine

## 2011-08-17 ENCOUNTER — Ambulatory Visit
Admission: RE | Admit: 2011-08-17 | Discharge: 2011-08-17 | Disposition: A | Discharge status: Home / Self Care | Payer: Managed Care, Other (non HMO) | Source: Ambulatory Visit | Attending: Internal Medicine | Admitting: Internal Medicine

## 2011-08-17 DIAGNOSIS — M542 Cervicalgia: Secondary | ICD-10-CM

## 2011-08-19 ENCOUNTER — Telehealth: Payer: Self-pay | Admitting: Internal Medicine

## 2011-08-19 DIAGNOSIS — G589 Mononeuropathy, unspecified: Secondary | ICD-10-CM

## 2011-08-19 NOTE — Telephone Encounter (Signed)
Ok to change referral to Dr. Danielle Dess.  Please change order Cindy.

## 2011-08-19 NOTE — Telephone Encounter (Signed)
Referral order placed.

## 2011-08-19 NOTE — Telephone Encounter (Signed)
Caller: Sally/Spouse; PCP: Thomos Lemons; CB#: [phone]; ; ; Call regarding: Wife calling with MD she would like Pt to follow up on the MRI results after Dr Fredrik Cove has reviewed.  Pt would like to use  Dr. Danielle Dess W/ [facility], [phone];  Pt's tingling in Neck radiating into R Arm w/ pain has worsen since MRI.  Afebrile.  All emergent sxs r/o per Neck Pain Protocol.  PLEASE CALL WIFE BACK.

## 2011-09-03 ENCOUNTER — Other Ambulatory Visit: Payer: Self-pay | Admitting: Internal Medicine

## 2011-09-06 ENCOUNTER — Other Ambulatory Visit: Payer: Self-pay | Admitting: *Deleted

## 2011-09-06 MED ORDER — TRAMADOL HCL 50 MG PO TABS: 50.0000 mg | ORAL_TABLET | Freq: Three times a day (TID) | ORAL | Status: AC | PRN

## 2011-09-09 ENCOUNTER — Telehealth: Payer: Self-pay | Admitting: Internal Medicine

## 2011-09-09 DIAGNOSIS — R7309 Other abnormal glucose: Secondary | ICD-10-CM

## 2011-09-09 MED ORDER — TAMSULOSIN HCL 0.4 MG PO CAPS: 0.4000 mg | ORAL_CAPSULE | Freq: Every day | ORAL | Status: DC

## 2011-09-09 MED ORDER — METFORMIN HCL 500 MG PO TABS: 500.0000 mg | ORAL_TABLET | Freq: Two times a day (BID) | ORAL | Status: DC

## 2011-09-09 NOTE — Telephone Encounter (Signed)
Refill-tamsulosin hcl 0.4mg  capsule. Take one capsule by mouth every day after supper. Qty 90 last fill 4.16.13  Refill-metformin hcl 500mg  tablet. Take one tablet by mouth twice a day with a meal. Qty 180 last fill 4.20.13

## 2011-09-09 NOTE — Telephone Encounter (Signed)
rx sent in electronically 

## 2011-09-19 ENCOUNTER — Other Ambulatory Visit: Payer: Self-pay | Admitting: Internal Medicine

## 2011-10-03 ENCOUNTER — Other Ambulatory Visit: Payer: Self-pay | Admitting: Neurological Surgery

## 2011-10-07 NOTE — Pre-Procedure Instructions (Signed)
[address]  10/07/2011   Your procedure is scheduled on:  August 20th  Report to [facility] at 1230 PM.  Call this number if you have problems the morning of surgery: [phone]   Remember:   Do not eat food or drink:After Midnight.  Take these medicines the morning of surgery with A SIP OF WATER: Norvasc, pain pill if needed   Do not wear jewelry, make-up or nail polish.  Do not wear lotions, powders, or perfumes.   Do not shave 48 hours prior to surgery. Men may shave face and neck.  Do not bring valuables to the hospital.  Contacts, dentures or bridgework may not be worn into surgery.  Leave suitcase in the car. After surgery it may be brought to your room.  For patients admitted to the hospital, checkout time is 11:00 AM the day of discharge.   Patients discharged the day of surgery will not be allowed to drive home.   Special Instructions: CHG Shower Use Special Wash: 1/2 bottle night before surgery and 1/2 bottle morning of surgery.   Please read over the following fact sheets that you were given: Pain Booklet, Coughing and Deep Breathing, MRSA Information and Surgical Site Infection Prevention

## 2011-10-08 ENCOUNTER — Encounter (HOSPITAL_COMMUNITY)
Admission: RE | Admit: 2011-10-08 | Discharge: 2011-10-08 | Disposition: A | Discharge status: Home / Self Care | Payer: Managed Care, Other (non HMO) | Source: Ambulatory Visit | Attending: Neurological Surgery | Admitting: Neurological Surgery

## 2011-10-08 ENCOUNTER — Encounter (HOSPITAL_COMMUNITY): Payer: Self-pay

## 2011-10-08 HISTORY — DX: Sleep apnea, unspecified: G47.30

## 2011-10-08 HISTORY — DX: Malignant (primary) neoplasm, unspecified: C80.1

## 2011-10-08 HISTORY — DX: Essential (primary) hypertension: I10

## 2011-10-08 LAB — CBC
HCT: 40.1 % (ref 39.0–52.0)
Hemoglobin: 14.5 g/dL (ref 13.0–17.0)
MCH: 31.9 pg (ref 26.0–34.0)
RBC: 4.55 MIL/uL (ref 4.22–5.81)

## 2011-10-08 LAB — SURGICAL PCR SCREEN: MRSA, PCR: NEGATIVE

## 2011-10-14 MED ORDER — VANCOMYCIN HCL 1000 MG IV SOLR
1500.0000 mg | INTRAVENOUS | Status: AC
Start: 2011-10-15 — End: 2011-10-15
  Administered 2011-10-15: 1500 mg via INTRAVENOUS
  Filled 2011-10-14 (×2): qty 1500

## 2011-10-15 ENCOUNTER — Ambulatory Visit (HOSPITAL_COMMUNITY)
Admission: RE | Admit: 2011-10-15 | Discharge: 2011-10-16 | Disposition: A | Discharge status: Home / Self Care | Payer: Managed Care, Other (non HMO) | Source: Ambulatory Visit | Attending: Neurological Surgery | Admitting: Neurological Surgery

## 2011-10-15 ENCOUNTER — Ambulatory Visit (HOSPITAL_COMMUNITY): Payer: Managed Care, Other (non HMO) | Admitting: Anesthesiology

## 2011-10-15 ENCOUNTER — Encounter (HOSPITAL_COMMUNITY): Payer: Self-pay | Admitting: Anesthesiology

## 2011-10-15 ENCOUNTER — Ambulatory Visit (HOSPITAL_COMMUNITY): Payer: Managed Care, Other (non HMO)

## 2011-10-15 ENCOUNTER — Encounter (HOSPITAL_COMMUNITY)
Admission: RE | Disposition: A | Discharge status: Home / Self Care | Payer: Self-pay | Source: Ambulatory Visit | Attending: Neurological Surgery

## 2011-10-15 DIAGNOSIS — R7309 Other abnormal glucose: Secondary | ICD-10-CM

## 2011-10-15 DIAGNOSIS — Z23 Encounter for immunization: Secondary | ICD-10-CM | POA: Insufficient documentation

## 2011-10-15 DIAGNOSIS — E119 Type 2 diabetes mellitus without complications: Secondary | ICD-10-CM | POA: Insufficient documentation

## 2011-10-15 DIAGNOSIS — Z01818 Encounter for other preprocedural examination: Secondary | ICD-10-CM | POA: Insufficient documentation

## 2011-10-15 DIAGNOSIS — M47812 Spondylosis without myelopathy or radiculopathy, cervical region: Secondary | ICD-10-CM | POA: Insufficient documentation

## 2011-10-15 DIAGNOSIS — G4730 Sleep apnea, unspecified: Secondary | ICD-10-CM | POA: Insufficient documentation

## 2011-10-15 DIAGNOSIS — I10 Essential (primary) hypertension: Secondary | ICD-10-CM | POA: Insufficient documentation

## 2011-10-15 DIAGNOSIS — G4700 Insomnia, unspecified: Secondary | ICD-10-CM

## 2011-10-15 DIAGNOSIS — Z01812 Encounter for preprocedural laboratory examination: Secondary | ICD-10-CM | POA: Insufficient documentation

## 2011-10-15 HISTORY — PX: ANTERIOR CERVICAL DECOMP/DISCECTOMY FUSION: SHX1161

## 2011-10-15 LAB — COMPREHENSIVE METABOLIC PANEL
ALT: 66 U/L — ABNORMAL HIGH (ref 0–53)
AST: 34 U/L (ref 0–37)
CO2: 30 mEq/L (ref 19–32)
Chloride: 104 mEq/L (ref 96–112)
Creatinine, Ser: 0.9 mg/dL (ref 0.50–1.35)
GFR calc Af Amer: >90 mL/min (ref >90)
GFR calc non Af Amer: >90 mL/min (ref >90)
Glucose, Bld: 127 mg/dL — ABNORMAL HIGH (ref 70–99)
Sodium: 141 mEq/L (ref 135–145)
Total Bilirubin: 0.3 mg/dL (ref 0.3–1.2)

## 2011-10-15 LAB — GLUCOSE, CAPILLARY: Glucose-Capillary: 119 mg/dL — ABNORMAL HIGH (ref 70–99)

## 2011-10-15 SURGERY — ANTERIOR CERVICAL DECOMPRESSION/DISCECTOMY FUSION 1 LEVEL
Anesthesia: General | Site: Neck | Wound class: Clean

## 2011-10-15 MED ORDER — IRBESARTAN 150 MG PO TABS
150.0000 mg | ORAL_TABLET | Freq: Every day | ORAL | Status: DC
  Administered 2011-10-15 – 2011-10-16 (×2): 150 mg via ORAL
  Filled 2011-10-15 (×2): qty 1

## 2011-10-15 MED ORDER — SODIUM CHLORIDE 0.9 % IJ SOLN: 3.0000 mL | Freq: Two times a day (BID) | INTRAMUSCULAR | Status: DC

## 2011-10-15 MED ORDER — LIDOCAINE HCL 4 % MT SOLN
OROMUCOSAL | Status: DC | PRN
  Administered 2011-10-15: 4 mL via TOPICAL

## 2011-10-15 MED ORDER — ACETAMINOPHEN 325 MG PO TABS: 650.0000 mg | ORAL_TABLET | ORAL | Status: DC | PRN

## 2011-10-15 MED ORDER — ACETAMINOPHEN 10 MG/ML IV SOLN
INTRAVENOUS | Status: AC
  Filled 2011-10-15: qty 100

## 2011-10-15 MED ORDER — DIPHENHYDRAMINE HCL 50 MG/ML IJ SOLN
INTRAMUSCULAR | Status: AC
  Administered 2011-10-15: 25 mg
  Filled 2011-10-15: qty 1

## 2011-10-15 MED ORDER — ATORVASTATIN CALCIUM 20 MG PO TABS
20.0000 mg | ORAL_TABLET | Freq: Every day | ORAL | Status: DC
  Administered 2011-10-15: 20 mg via ORAL
  Filled 2011-10-15 (×2): qty 1

## 2011-10-15 MED ORDER — BACITRACIN 50000 UNITS IM SOLR
INTRAMUSCULAR | Status: AC
  Filled 2011-10-15: qty 1

## 2011-10-15 MED ORDER — GLYCOPYRROLATE 0.2 MG/ML IJ SOLN
INTRAMUSCULAR | Status: DC | PRN
  Administered 2011-10-15: .8 mg via INTRAVENOUS

## 2011-10-15 MED ORDER — SODIUM CHLORIDE 0.9 % IV SOLN: 250.0000 mL | INTRAVENOUS | Status: DC

## 2011-10-15 MED ORDER — ONDANSETRON HCL 4 MG/2ML IJ SOLN: 4.0000 mg | Freq: Once | INTRAMUSCULAR | Status: DC | PRN

## 2011-10-15 MED ORDER — HYDROMORPHONE HCL PF 1 MG/ML IJ SOLN
INTRAMUSCULAR | Status: AC
  Filled 2011-10-15: qty 1

## 2011-10-15 MED ORDER — MENTHOL 3 MG MT LOZG: 1.0000 | LOZENGE | OROMUCOSAL | Status: DC | PRN

## 2011-10-15 MED ORDER — PROPOFOL 10 MG/ML IV EMUL
INTRAVENOUS | Status: DC | PRN
  Administered 2011-10-15: 150 mg via INTRAVENOUS
  Administered 2011-10-15: 50 mg via INTRAVENOUS

## 2011-10-15 MED ORDER — AMLODIPINE BESYLATE 5 MG PO TABS
5.0000 mg | ORAL_TABLET | Freq: Every day | ORAL | Status: DC
  Administered 2011-10-15 – 2011-10-16 (×2): 5 mg via ORAL
  Filled 2011-10-15 (×2): qty 1

## 2011-10-15 MED ORDER — DIAZEPAM 5 MG PO TABS
5.0000 mg | ORAL_TABLET | Freq: Four times a day (QID) | ORAL | Status: DC | PRN
  Administered 2011-10-16: 5 mg via ORAL
  Filled 2011-10-15: qty 1

## 2011-10-15 MED ORDER — HYDROMORPHONE HCL PF 1 MG/ML IJ SOLN
0.2500 mg | INTRAMUSCULAR | Status: DC | PRN
  Administered 2011-10-15: 0.5 mg via INTRAVENOUS
  Administered 2011-10-15: 0.25 mg via INTRAVENOUS
  Administered 2011-10-15 (×2): 0.5 mg via INTRAVENOUS
  Administered 2011-10-15: 0.25 mg via INTRAVENOUS

## 2011-10-15 MED ORDER — 0.9 % SODIUM CHLORIDE (POUR BTL) OPTIME
TOPICAL | Status: DC | PRN
  Administered 2011-10-15: 1000 mL

## 2011-10-15 MED ORDER — CYCLOBENZAPRINE HCL 10 MG PO TABS
10.0000 mg | ORAL_TABLET | Freq: Three times a day (TID) | ORAL | Status: DC | PRN
  Administered 2011-10-15: 10 mg via ORAL
  Filled 2011-10-15: qty 1

## 2011-10-15 MED ORDER — BUPIVACAINE HCL (PF) 0.5 % IJ SOLN
INTRAMUSCULAR | Status: DC | PRN
  Administered 2011-10-15: 3.5 mL

## 2011-10-15 MED ORDER — DIPHENHYDRAMINE HCL 50 MG/ML IJ SOLN: 50.0000 mg | Freq: Once | INTRAMUSCULAR | Status: DC

## 2011-10-15 MED ORDER — PNEUMOCOCCAL VAC POLYVALENT 25 MCG/0.5ML IJ INJ
0.5000 mL | INJECTION | INTRAMUSCULAR | Status: AC
  Administered 2011-10-16: 0.5 mL via INTRAMUSCULAR
  Filled 2011-10-15: qty 0.5

## 2011-10-15 MED ORDER — LIDOCAINE-EPINEPHRINE 1 %-1:100000 IJ SOLN
INTRAMUSCULAR | Status: DC | PRN
  Administered 2011-10-15: 3.5 mL

## 2011-10-15 MED ORDER — DEXAMETHASONE SODIUM PHOSPHATE 4 MG/ML IJ SOLN
INTRAMUSCULAR | Status: DC | PRN
  Administered 2011-10-15: 10 mg via INTRAVENOUS

## 2011-10-15 MED ORDER — ZOLPIDEM TARTRATE 5 MG PO TABS: 10.0000 mg | ORAL_TABLET | Freq: Every evening | ORAL | Status: DC | PRN

## 2011-10-15 MED ORDER — ACETAMINOPHEN 650 MG RE SUPP: 650.0000 mg | RECTAL | Status: DC | PRN

## 2011-10-15 MED ORDER — THROMBIN 5000 UNITS EX SOLR
CUTANEOUS | Status: DC | PRN
  Administered 2011-10-15 (×2): 5000 [IU] via TOPICAL

## 2011-10-15 MED ORDER — DIPHENHYDRAMINE HCL 25 MG PO CAPS: 25.0000 mg | ORAL_CAPSULE | ORAL | Status: DC | PRN

## 2011-10-15 MED ORDER — ONDANSETRON HCL 4 MG/2ML IJ SOLN
INTRAMUSCULAR | Status: DC | PRN
  Administered 2011-10-15: 4 mg via INTRAVENOUS

## 2011-10-15 MED ORDER — SODIUM CHLORIDE 0.9 % IV SOLN
INTRAVENOUS | Status: AC
  Filled 2011-10-15: qty 500

## 2011-10-15 MED ORDER — ALUM & MAG HYDROXIDE-SIMETH 200-200-20 MG/5ML PO SUSP: 30.0000 mL | Freq: Four times a day (QID) | ORAL | Status: DC | PRN

## 2011-10-15 MED ORDER — HEMOSTATIC AGENTS (NO CHARGE) OPTIME
TOPICAL | Status: DC | PRN
  Administered 2011-10-15: 1 via TOPICAL

## 2011-10-15 MED ORDER — CEFAZOLIN SODIUM 1-5 GM-% IV SOLN: 1.0000 g | Freq: Three times a day (TID) | INTRAVENOUS | Status: DC

## 2011-10-15 MED ORDER — SODIUM CHLORIDE 0.9 % IJ SOLN: 3.0000 mL | INTRAMUSCULAR | Status: DC | PRN

## 2011-10-15 MED ORDER — ACETAMINOPHEN 10 MG/ML IV SOLN
1000.0000 mg | Freq: Once | INTRAVENOUS | Status: AC | PRN
  Administered 2011-10-15: 1000 mg via INTRAVENOUS

## 2011-10-15 MED ORDER — ONDANSETRON HCL 4 MG/2ML IJ SOLN: 4.0000 mg | INTRAMUSCULAR | Status: DC | PRN

## 2011-10-15 MED ORDER — LIDOCAINE HCL (CARDIAC) 20 MG/ML IV SOLN
INTRAVENOUS | Status: DC | PRN
  Administered 2011-10-15: 50 mg via INTRAVENOUS

## 2011-10-15 MED ORDER — BACITRACIN 50000 UNITS IM SOLR
INTRAMUSCULAR | Status: DC | PRN
  Administered 2011-10-15: 17:00:00

## 2011-10-15 MED ORDER — METFORMIN HCL 500 MG PO TABS
500.0000 mg | ORAL_TABLET | Freq: Two times a day (BID) | ORAL | Status: DC
  Administered 2011-10-15 – 2011-10-16 (×2): 500 mg via ORAL
  Filled 2011-10-15 (×4): qty 1

## 2011-10-15 MED ORDER — ROCURONIUM BROMIDE 100 MG/10ML IV SOLN
INTRAVENOUS | Status: DC | PRN
  Administered 2011-10-15: 50 mg via INTRAVENOUS
  Administered 2011-10-15: 20 mg via INTRAVENOUS

## 2011-10-15 MED ORDER — LACTATED RINGERS IV SOLN
INTRAVENOUS | Status: DC | PRN
  Administered 2011-10-15: 16:00:00 via INTRAVENOUS

## 2011-10-15 MED ORDER — MORPHINE SULFATE 2 MG/ML IJ SOLN
1.0000 mg | INTRAMUSCULAR | Status: DC | PRN
  Administered 2011-10-15 – 2011-10-16 (×3): 2 mg via INTRAVENOUS
  Filled 2011-10-15 (×3): qty 1

## 2011-10-15 MED ORDER — SUFENTANIL CITRATE 50 MCG/ML IV SOLN
INTRAVENOUS | Status: DC | PRN
  Administered 2011-10-15: 30 ug via INTRAVENOUS
  Administered 2011-10-15 (×2): 10 ug via INTRAVENOUS

## 2011-10-15 MED ORDER — NEOSTIGMINE METHYLSULFATE 1 MG/ML IJ SOLN
INTRAMUSCULAR | Status: DC | PRN
  Administered 2011-10-15: 5 mg via INTRAVENOUS

## 2011-10-15 MED ORDER — OXYCODONE-ACETAMINOPHEN 5-325 MG PO TABS
1.0000 | ORAL_TABLET | ORAL | Status: DC | PRN
  Administered 2011-10-15 – 2011-10-16 (×3): 2 via ORAL
  Filled 2011-10-15 (×3): qty 2

## 2011-10-15 MED ORDER — PHENOL 1.4 % MT LIQD: 1.0000 | OROMUCOSAL | Status: DC | PRN

## 2011-10-15 SURGICAL SUPPLY — 58 items
BAG DECANTER FOR FLEXI CONT (MISCELLANEOUS) ×2 IMPLANT
BANDAGE GAUZE ELAST BULKY 4 IN (GAUZE/BANDAGES/DRESSINGS) IMPLANT
BIT DRILL 14MM (INSTRUMENTS) ×1 IMPLANT
BIT DRILL NEURO 2X3.1 SFT TUCH (MISCELLANEOUS) ×1 IMPLANT
BUR BARREL STRAIGHT FLUTE 4.0 (BURR) ×2 IMPLANT
CANISTER SUCTION 2500CC (MISCELLANEOUS) ×2 IMPLANT
CLOTH BEACON ORANGE TIMEOUT ST (SAFETY) ×2 IMPLANT
CONT SPEC 4OZ CLIKSEAL STRL BL (MISCELLANEOUS) ×4 IMPLANT
DECANTER SPIKE VIAL GLASS SM (MISCELLANEOUS) IMPLANT
DERMABOND ADVANCED (GAUZE/BANDAGES/DRESSINGS) ×1
DERMABOND ADVANCED .7 DNX12 (GAUZE/BANDAGES/DRESSINGS) ×1 IMPLANT
DRAPE LAPAROTOMY 100X72 PEDS (DRAPES) ×2 IMPLANT
DRAPE MICROSCOPE LEICA (MISCELLANEOUS) IMPLANT
DRAPE POUCH INSTRU U-SHP 10X18 (DRAPES) ×2 IMPLANT
DRESSING TELFA 8X3 (GAUZE/BANDAGES/DRESSINGS) ×2 IMPLANT
DRILL 14MM (INSTRUMENTS) ×2
DRILL NEURO 2X3.1 SOFT TOUCH (MISCELLANEOUS) ×2
DRSG OPSITE 4X5.5 SM (GAUZE/BANDAGES/DRESSINGS) ×2 IMPLANT
DURAPREP 6ML APPLICATOR 50/CS (WOUND CARE) ×2 IMPLANT
ELECT REM PT RETURN 9FT ADLT (ELECTROSURGICAL) ×2
ELECTRODE REM PT RTRN 9FT ADLT (ELECTROSURGICAL) ×1 IMPLANT
GAUZE SPONGE 4X4 16PLY XRAY LF (GAUZE/BANDAGES/DRESSINGS) IMPLANT
GLOVE BIO SURGEON STRL SZ 6.5 (GLOVE) ×4 IMPLANT
GLOVE BIO SURGEON STRL SZ7.5 (GLOVE) IMPLANT
GLOVE BIOGEL PI IND STRL 7.0 (GLOVE) ×1 IMPLANT
GLOVE BIOGEL PI IND STRL 7.5 (GLOVE) IMPLANT
GLOVE BIOGEL PI IND STRL 8.5 (GLOVE) ×1 IMPLANT
GLOVE BIOGEL PI INDICATOR 7.0 (GLOVE) ×1
GLOVE BIOGEL PI INDICATOR 7.5 (GLOVE)
GLOVE BIOGEL PI INDICATOR 8.5 (GLOVE) ×1
GLOVE ECLIPSE 8.5 STRL (GLOVE) ×2 IMPLANT
GLOVE EXAM NITRILE LRG STRL (GLOVE) IMPLANT
GLOVE EXAM NITRILE MD LF STRL (GLOVE) IMPLANT
GLOVE EXAM NITRILE XL STR (GLOVE) IMPLANT
GLOVE EXAM NITRILE XS STR PU (GLOVE) IMPLANT
GOWN BRE IMP SLV AUR LG STRL (GOWN DISPOSABLE) ×2 IMPLANT
GOWN BRE IMP SLV AUR XL STRL (GOWN DISPOSABLE) ×2 IMPLANT
GOWN STRL REIN 2XL LVL4 (GOWN DISPOSABLE) ×2 IMPLANT
GRAFT CERVICAL 7MM (Neuro Prosthesis/Implant) ×2 IMPLANT
HEAD HALTER (SOFTGOODS) ×2 IMPLANT
KIT BASIN OR (CUSTOM PROCEDURE TRAY) ×2 IMPLANT
KIT ROOM TURNOVER OR (KITS) ×2 IMPLANT
NEEDLE HYPO 22GX1.5 SAFETY (NEEDLE) ×2 IMPLANT
NEEDLE SPNL 22GX3.5 QUINCKE BK (NEEDLE) ×2 IMPLANT
NS IRRIG 1000ML POUR BTL (IV SOLUTION) ×2 IMPLANT
PACK LAMINECTOMY NEURO (CUSTOM PROCEDURE TRAY) ×2 IMPLANT
PAD ARMBOARD 7.5X6 YLW CONV (MISCELLANEOUS) ×6 IMPLANT
PLATE TRESTLE LUXE 12 1LVL (Plate) ×2 IMPLANT
PUTTY BONE 2.5CC ×2 IMPLANT
RUBBERBAND STERILE (MISCELLANEOUS) IMPLANT
SCREW 14MM (Screw) ×8 IMPLANT
SPONGE INTESTINAL PEANUT (DISPOSABLE) ×2 IMPLANT
SPONGE SURGIFOAM ABS GEL SZ50 (HEMOSTASIS) ×2 IMPLANT
SUT VIC AB 3-0 SH 8-18 (SUTURE) ×2 IMPLANT
SYR 20ML ECCENTRIC (SYRINGE) ×2 IMPLANT
TOWEL OR 17X24 6PK STRL BLUE (TOWEL DISPOSABLE) IMPLANT
TOWEL OR 17X26 10 PK STRL BLUE (TOWEL DISPOSABLE) ×2 IMPLANT
WATER STERILE IRR 1000ML POUR (IV SOLUTION) ×2 IMPLANT

## 2011-10-15 NOTE — Transfer of Care (Signed)
Immediate Anesthesia Transfer of Care Note  Patient: Dylan [name]  Procedure(s) Performed: Procedure(s) (LRB): ANTERIOR CERVICAL DECOMPRESSION/DISCECTOMY FUSION 1 LEVEL (N/A)  Patient Location: PACU  Anesthesia Type: General  Level of Consciousness: awake, alert , oriented and patient cooperative  Airway & Oxygen Therapy: Patient Spontanous Breathing and Patient connected to face mask oxygen  Post-op Assessment: Report given to PACU RN, Post -op Vital signs reviewed and stable and Patient moving all extremities X 4  Post vital signs: Reviewed and stable  Complications: No apparent anesthesia complications

## 2011-10-15 NOTE — Anesthesia Postprocedure Evaluation (Signed)
  Anesthesia Post-op Note  Patient: Dylan [name]  Procedure(s) Performed: Procedure(s) (LRB): ANTERIOR CERVICAL DECOMPRESSION/DISCECTOMY FUSION 1 LEVEL (N/A)  Patient Location: PACU  Anesthesia Type: General  Level of Consciousness: awake, alert  and oriented  Airway and Oxygen Therapy: Patient Spontanous Breathing and Patient connected to nasal cannula oxygen  Post-op Pain: mild  Post-op Assessment: Post-op Vital signs reviewed and Patient's Cardiovascular Status Stable  Post-op Vital Signs: stable  Complications: No apparent anesthesia complications

## 2011-10-15 NOTE — Progress Notes (Signed)
Care of pt assumed by MA Makia Bossi RN at 1800.  Pt was admitted to npacu by Danella Sensing RN

## 2011-10-15 NOTE — Progress Notes (Signed)
Pt c/o of itching, has rash like red ares over torso, arms and legs. Dr Noreene Larsson notified--he will  Be by to see pt.

## 2011-10-15 NOTE — Progress Notes (Signed)
Patient ID: Dylan [name], male   DOB: [birth year], 61 y.o.   MRN: [id number] Vital signs stable alert. Motor function appears intact in upper extremities. Incisions clean and dry.  Patient appears to have allergic reaction to some unknown medication characterized by itching and confluent red rash on skin of face shoulder arms trunk extremities.  Observed postoperatively Benadryl for itching.

## 2011-10-15 NOTE — H&P (Signed)
CHIEF COMPLAINT:   Pain in the back of neck with radiation to right shoulder and right arm.   HISTORY OF PRESENT ILLNESS:  Dylan [name] is a 61 year old right-handed individual who works as a Sport and exercise psychologist.  He notes that for about six months or perhaps even a little more he has developed some progressively worsening discomfort in the back of the neck with radiation out into the right shoulder.  He notes also that it will radiate down into the right arm and he has had some numbness into the forearm.  It seemed to stop at the wrist.  The pain, however, seems to be worsening of the recent past and he finds that any turning to the right side and particularly turning and looking upwards tends to aggravate the pain substantially.  More recently he has become aware of some weakness in his deltoid on that right side.  He notes that at times it seems as though the symptoms are progressing day wise. He is seen now at the request of Dr. Thomos Lemons, having had an MRI of the cervical spine that was recently performed.    I reviewed the study of the MRI and some plain x-rays that the patient had performed a while back in the office today.  The plain radiographs demonstrate that on the oblique views, particularly on the right side, there is some foraminal stenosis at the level of C4-5.  This is caused by substantial osteophytosis from the inferior margin of C4 and the superior margin of C5.  The MRI demonstrates that the patient has an amply patent spinal canal.  He does have some evidence of moderate stenosis at the level of C4-5 where there is a broad-based bulge of the disc with osteophytic overgrowth and on the right side one can appreciate a small fragment of disc in the foramen with significant bony cover narrowing the foramen substantially.    PAST MEDICAL HISTORY:  The patient's general health is good.  He does have some hypertension and is on some medication for this. He has also been diagnosed as a borderline  diabetic. He has had sinus surgery and knee arthroscopy in the past.  He has had a ruptured appendix and hepatitis A as a child.  Current medications include Zolpidem, Metformin, Livalo, Diovan, Amlodipine, Fish Oil, Tramadol, Cyclobenzaprine, Vita Fusion vitamins, Hydrocodone, low dose Aspirin, Acetaminophen, and Ibuprofen.  He notes ALLERGIES AND INTOLERANCE TO HEPARIN WHICH CAUSES SHORTNESS OF BREATH AND CHEST PAIN, PENICILLIN WHICH CAUSES A RASH, AND SUCCINYLCHOLINE.  SOCIAL HISTORY:    He does not smoke. He drinks alcohol on a social basis. Height and weight have been stable at 6" and 235 lbs.    REVIEW OF SYSTEMS:   Notable for some wearing of glasses, hearing loss, inability to smell, high blood pressure, high cholesterol, history of right bundle branch block, back pain, arm pain, arthritis, neck pain, history of skin cancer and diabetes as noted in the history of present illness.  Also some difficulty with starting and stopping of the urinary stream.    PHYSICAL EXAMINATION:  On physical examination, I note that he has a distinctly limited range of motion turning only 45 degrees to the right maximally, turning about 60 degrees to the left in casual motions and his flexion and extension appears to be limited about 50%.  His motor strength demonstrates weakness in the deltoid particularly in the anterior portion of the deltoid to 4/5.  Biceps strength, wrist extensor strength, grips and intrinsic strength  is normal and no evidence of atrophy is noted.  His reflexes are silent in the biceps and the triceps both.    IMPRESSION:    The patient has evidence of advanced spondylitic changes with a small focal disc herniation at C4-5 on the right side.  He has some ventral effacement of the spinal cord secondary to spondylitic overgrowth at this level.  The patient has been having symptoms for a period of about six months now and he notes that his symptoms are getting worse now with features of weakness.  I  indicated to the patient that we could consider some conservative treatment to include a trial of an epidural steroid injection; however, my sense that this problem having been worsening despite the passage of time is that this is likely going to be a futile attempt to treat this conservatively.  Ultimately, I believe the patient will require surgical decompression and arthrodesis at the level of C4-5.  I discussed the surgery with him and his wife, demonstrating on a model the approach to the neck from the front of the neck, moving the windpipe and esophagus off to one side and the carotid artery and jugular vein off to the other side, and then removing the entirety of the disc, the bone spur, and the fragment of disc material that is affecting the C5 nerve root.  Once the decompression is completed we place a bone graft that is obtained from a cadaveric specimen of the radius or the fibula, and then secure that in place with a small Titanium plate. Ultimately, the bone is absorbed into the patient's own bone and vitalizes this graft.  The surgery itself is typically done on an outpatient basis and most common complicating features that we can see is some weakness of the nerve and the potential for damage to it from the surgery itself, and some issues with swallowing which will usually limit themselves.  On occasion, however, one has to be aware that there can be injury either to the nerve or the voice box which can result in hoarseness or some difficulty with swallowing, or even an esophageal perforation. These things occur extremely rarely, but are the worst of the complications from the surgery in this area. Overall, I believe the patient is a good candidate for surgery and he should feel good and hopefully fairly immediate relief of the worst of his symptoms and I indicated that most patients can resume normal activities at about eight weeks after surgery.

## 2011-10-15 NOTE — Anesthesia Preprocedure Evaluation (Addendum)
Anesthesia Evaluation  Patient identified by MRN, date of birth, ID band Patient awake    Reviewed: Allergy & Precautions, H&P , NPO status , Patient's Chart, lab work & pertinent test results  Airway Mallampati: I TM Distance: >3 FB Neck ROM: Full    Dental   Pulmonary sleep apnea ,          Cardiovascular hypertension, Pt. on medications     Neuro/Psych    GI/Hepatic   Endo/Other  Well Controlled, Type 2, Oral Hypoglycemic Agents  Renal/GU      Musculoskeletal   Abdominal   Peds  Hematology   Anesthesia Other Findings   Reproductive/Obstetrics                           Anesthesia Physical Anesthesia Plan  ASA: II  Anesthesia Plan: General   Post-op Pain Management:    Induction: Intravenous  Airway Management Planned: Oral ETT  Additional Equipment:   Intra-op Plan:   Post-operative Plan: Extubation in OR  Informed Consent: I have reviewed the patients History and Physical, chart, labs and discussed the procedure including the risks, benefits and alternatives for the proposed anesthesia with the patient or authorized representative who has indicated his/her understanding and acceptance.     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 Avoid Succinyl Choline.)        Anesthesia Quick Evaluation

## 2011-10-15 NOTE — Op Note (Signed)
Preoperative diagnosis: Cervical spondylosis with radiculopathy C4-C5 Post operative diagnosis: Cervical spondylosis with radiculopathy C4-C5 Procedure: Anterior cervical discectomy decompression of nerve roots and spinal canal C4-C5 arthrodesis with structural allograft, Alphatec plate fixation [id number] Surgeon: Barnett Abu M.D. Asst.: Sharyon Cable M.D. Indications: Patient is a 61 year old individual who has had significant neck shoulder and arm pain on right side has evidence of disc degenerative changes at C4-C5 with a herniation on the right side patient has been advised regarding surgical decompression and arthrodesis C4-C5. Procedure: The patient was brought to the operating room placed on the table in supine position. After the smooth induction of general endotracheal anesthesia neck was placed in 5 pounds of halter traction and prepped with alcohol and DuraPrep. After sterile draping and appropriate timeout procedure a transverse incision was created in the left side of the neck and carried down to the platysma. The plane between the sternocleidomastoid and strap muscles dissected bluntly until the prevertebral space was reached. The first identifiable disc space was noted to be C3-C4 on a localizing radiograph. The dissection was then undertaken in the longus coli muscle to allow placement of a self-retaining Caspar type retractor.  The anterior longitudinal ligament was opened at C4-C5 and ventral osteophytes were removed with a Leksell rongeur and Kerrison punch. Interspace was cleared of significant quantity of the degenerated disc material in the region of the posterior longitudinal ligament was reached. Dissection was carried out using a high-speed drill and 3-0 Karlin curettes. Uncinate processes were drilled down and removed and osteophytes from the inferior margin of the body of C4 were removed with a Kerrison 2 mm gold punch. After the central canal and lateral recesses were well  decompressed the stasis was achieved with the bipolar cautery and some small pledgets of Gelfoam soaked in thrombin that were later irrigated away.  A7 mm transgraft was then prepared by enlarging the central cavity and filling with demineralized bone matrix and placing into the interspace.  Next the retractor was removed and a 12 mm trestle plate was placed over the vertebral bodies and secured with 14 mm variable angle screws. A final localizing radiograph identified the position of the surgical construct. The stasis was achieved in the soft tissues and then the platysma was closed with 3-0 Vicryl in an interrupted fashion and 3-0 Vicryl was used in the subcuticular tissue. Blood loss was estimated at 25 cc

## 2011-10-16 ENCOUNTER — Encounter (HOSPITAL_COMMUNITY): Payer: Self-pay | Admitting: Neurological Surgery

## 2011-10-16 MED ORDER — DIAZEPAM 5 MG PO TABS: 5.0000 mg | ORAL_TABLET | Freq: Four times a day (QID) | ORAL | Status: AC | PRN

## 2011-10-16 MED ORDER — OXYCODONE-ACETAMINOPHEN 5-325 MG PO TABS: 1.0000 | ORAL_TABLET | ORAL | Status: AC | PRN

## 2011-10-16 NOTE — Progress Notes (Signed)
Orthopedic Tech Progress Note Patient Details:  ROI JAFARI 1950-08-08 [id number]  Ortho Devices Type of Ortho Device: Soft collar Ortho Device/Splint Interventions: Application   Cammer, Mickie Bail 10/16/2011, 10:19 AM

## 2011-10-16 NOTE — Discharge Summary (Signed)
  Admitting diagnosis: Cervical spondylosis with radiculopathy and herniated nucleus pulposus C4-C5  discharge diagnoses: Cervical spondylosis with radiculopathy and herniated nucleus pulposus C4-C5 Condition on discharge: Improved Hospital course: Patient was admitted to undergo anterior cervical decompression arthrodesis C4-C5 which was performed without difficulty. Patient tolerated surgery well and is now discharged home. Discharge prescriptions: Percocet 5/325 #30 no refills Valium 5 mg #30 no refills

## 2011-10-21 ENCOUNTER — Encounter (HOSPITAL_COMMUNITY): Payer: Self-pay

## 2011-11-29 ENCOUNTER — Other Ambulatory Visit: Payer: Self-pay | Admitting: Internal Medicine

## 2012-02-08 ENCOUNTER — Other Ambulatory Visit: Payer: Self-pay | Admitting: Internal Medicine

## 2012-02-27 ENCOUNTER — Telehealth: Payer: Self-pay | Admitting: Internal Medicine

## 2012-02-27 DIAGNOSIS — G4700 Insomnia, unspecified: Secondary | ICD-10-CM

## 2012-02-27 MED ORDER — ZOLPIDEM TARTRATE 10 MG PO TABS: 10.0000 mg | ORAL_TABLET | Freq: Every evening | ORAL | Status: DC | PRN

## 2012-02-27 NOTE — Telephone Encounter (Signed)
Patient called stating that he has been trying to get his ambien 10 mg 1poqd prn sent to [facility] in [place] and they state they have not heard back from the office. Please assist.

## 2012-02-27 NOTE — Telephone Encounter (Signed)
rx called in, pt aware 

## 2012-02-27 NOTE — Telephone Encounter (Signed)
Ok to refill x 5 

## 2012-03-23 ENCOUNTER — Telehealth: Payer: Self-pay | Admitting: Internal Medicine

## 2012-03-23 DIAGNOSIS — R7309 Other abnormal glucose: Secondary | ICD-10-CM

## 2012-03-23 MED ORDER — METFORMIN HCL 500 MG PO TABS: 500.0000 mg | ORAL_TABLET | Freq: Two times a day (BID) | ORAL | Status: DC

## 2012-03-23 NOTE — Telephone Encounter (Signed)
Refill- metformin hcl 500mg  tablet. Take one tablet by mouth twice a day with meals. Qty 180 last fill 10.26.13

## 2012-03-23 NOTE — Telephone Encounter (Signed)
rx sent in electronically 

## 2012-03-24 ENCOUNTER — Other Ambulatory Visit: Payer: Self-pay | Admitting: Internal Medicine

## 2012-06-21 ENCOUNTER — Other Ambulatory Visit: Payer: Self-pay | Admitting: Internal Medicine

## 2012-07-09 ENCOUNTER — Other Ambulatory Visit (INDEPENDENT_AMBULATORY_CARE_PROVIDER_SITE_OTHER): Payer: Managed Care, Other (non HMO)

## 2012-07-09 DIAGNOSIS — I10 Essential (primary) hypertension: Secondary | ICD-10-CM

## 2012-07-09 DIAGNOSIS — R7309 Other abnormal glucose: Secondary | ICD-10-CM

## 2012-07-09 DIAGNOSIS — E785 Hyperlipidemia, unspecified: Secondary | ICD-10-CM

## 2012-07-09 LAB — BASIC METABOLIC PANEL
GFR: 89.05 mL/min (ref >60.00)
Potassium: 4.1 mEq/L (ref 3.5–5.1)
Sodium: 137 mEq/L (ref 135–145)

## 2012-07-09 LAB — HEPATIC FUNCTION PANEL
AST: 36 U/L (ref 0–37)
Alkaline Phosphatase: 46 U/L (ref 39–117)
Bilirubin, Direct: 0.1 mg/dL (ref 0.0–0.3)
Total Bilirubin: 0.6 mg/dL (ref 0.3–1.2)

## 2012-07-09 LAB — HEMOGLOBIN A1C: Hgb A1c MFr Bld: 6.3 % (ref 4.6–6.5)

## 2012-07-09 LAB — LIPID PANEL
LDL Cholesterol: 86 mg/dL (ref 0–99)
VLDL: 22.4 mg/dL (ref 0.0–40.0)

## 2012-07-11 ENCOUNTER — Other Ambulatory Visit: Payer: Self-pay | Admitting: Internal Medicine

## 2012-07-11 ENCOUNTER — Encounter: Payer: Self-pay | Admitting: Internal Medicine

## 2012-07-17 ENCOUNTER — Ambulatory Visit: Payer: Managed Care, Other (non HMO) | Admitting: Internal Medicine

## 2012-07-17 DIAGNOSIS — Z0289 Encounter for other administrative examinations: Secondary | ICD-10-CM

## 2012-08-06 ENCOUNTER — Encounter: Payer: Self-pay | Admitting: Internal Medicine

## 2012-08-06 ENCOUNTER — Ambulatory Visit (INDEPENDENT_AMBULATORY_CARE_PROVIDER_SITE_OTHER): Payer: Managed Care, Other (non HMO) | Admitting: Internal Medicine

## 2012-08-06 VITALS — BP 120/76 | HR 76 | Temp 98.6°F | Resp 16 | Ht 72.0 in | Wt 239.0 lb

## 2012-08-06 DIAGNOSIS — G4700 Insomnia, unspecified: Secondary | ICD-10-CM

## 2012-08-06 DIAGNOSIS — M1712 Unilateral primary osteoarthritis, left knee: Secondary | ICD-10-CM | POA: Insufficient documentation

## 2012-08-06 DIAGNOSIS — I10 Essential (primary) hypertension: Secondary | ICD-10-CM

## 2012-08-06 DIAGNOSIS — Z01810 Encounter for preprocedural cardiovascular examination: Secondary | ICD-10-CM

## 2012-08-06 DIAGNOSIS — E785 Hyperlipidemia, unspecified: Secondary | ICD-10-CM

## 2012-08-06 DIAGNOSIS — R7309 Other abnormal glucose: Secondary | ICD-10-CM

## 2012-08-06 DIAGNOSIS — M1710 Unilateral primary osteoarthritis, unspecified knee: Secondary | ICD-10-CM

## 2012-08-06 MED ORDER — AMLODIPINE BESYLATE 5 MG PO TABS: 5.0000 mg | ORAL_TABLET | Freq: Every day | ORAL | Status: DC

## 2012-08-06 MED ORDER — ZOLPIDEM TARTRATE 10 MG PO TABS: 10.0000 mg | ORAL_TABLET | Freq: Every evening | ORAL | Status: DC | PRN

## 2012-08-06 MED ORDER — METFORMIN HCL 500 MG PO TABS: 500.0000 mg | ORAL_TABLET | Freq: Two times a day (BID) | ORAL | Status: DC

## 2012-08-06 MED ORDER — PITAVASTATIN CALCIUM 4 MG PO TABS: 4.0000 mg | ORAL_TABLET | Freq: Every day | ORAL | Status: DC

## 2012-08-06 MED ORDER — VALSARTAN 160 MG PO TABS: 160.0000 mg | ORAL_TABLET | Freq: Every day | ORAL | Status: DC

## 2012-08-06 NOTE — Assessment & Plan Note (Signed)
Patient planning to schedule left knee replacement for end-stage osteoarthritis. Patient has multiple risk factors. I suggest preoperative cardiac clearance. Refer to cardiology. EKG shows normal sinus rhythm at 72 beats per minute. No significant change when compared to EKG from 2012

## 2012-08-06 NOTE — Progress Notes (Signed)
Subjective:    Patient ID: Dylan [name], male    DOB: March 04, 1950, 62 y.o.   MRN: [id number]  HPI  61 year old white male with history of hypertension, hyperlipidemia, obesity and borderline type 2 diabetes for routine followup. Patient reports struggling with worsening left knee osteoarthritis. He is followed by Dr. Shelle Iron. His previous knee x-ray showed advanced degenerative joint disease. He is considering left knee replacement.  He has gained approximately 6 pounds since previous visit.  He is trying his best to follow a low carb diet. His ability to exercise has been limited by left knee pain.  Patient denies any chest pain or unusual dyspnea with exertion.  Patient reports significantly decreasing his alcohol consumption.  Review of Systems Negative for chest pain, or shortness of breath  Past Medical History  Diagnosis Date  . Allergy     allergic rhinitis  . Hyperlipidemia   . Headache(784.0)   . Hepatitis     history of infections Hepatitis  . History of ulcerative colitis   . History of melanoma     face and forehead-- previously [place] [facility] [place]  . Diabetes mellitus   . Hypertension   . Cancer     melonoma  . Sleep apnea     last sleep study 10 yrs ago,CPAP    History   Social History  . Marital Status: Married    Spouse Name: N/A    Number of Children: N/A  . Years of Education: N/A   Occupational History  . Not on file.   Social History Main Topics  . Smoking status: Never Smoker   . Smokeless tobacco: Former Neurosurgeon    Quit date: 02/26/2003  . Alcohol Use: Yes     Comment: occassionally  . Drug Use: No  . Sexually Active: Not on file   Other Topics Concern  . Not on file   Social History Narrative   Wife works at cath lab at [employer] advanced breast cancer    Past Surgical History  Procedure Laterality Date  . Tonsillectomy  1969  . Appendectomy  1969  . Knee surgery      right knee  . Anterior cervical decomp/discectomy  fusion  10/15/2011    Procedure: ANTERIOR CERVICAL DECOMPRESSION/DISCECTOMY FUSION 1 LEVEL;  Surgeon: Barnett Abu, MD;  Location: [facility];  Service: Neurosurgery;  Laterality: N/A;  Cervical four-five Anterior cervical decompression/diskectomy/fusion    Family History  Problem Relation Age of Onset  . Cancer Brother     brain tumor  . Diabetes Other   . Cancer Other     ovarian    Allergies  Allergen Reactions  . Vancomycin Hives  . Heparin     REACTION: breathing problems  . Penicillins     REACTION: Rash  . Succinylcholine Chloride     REACTION: does not wake up from anesthetic    Current Outpatient Prescriptions on File Prior to Visit  Medication Sig Dispense Refill  . acetaminophen (TYLENOL) 500 MG tablet Take 2,000 mg by mouth every morning.      Marland Kitchen aspirin EC 81 MG tablet Take 81 mg by mouth daily.      Marland Kitchen HYDROcodone-acetaminophen (NORCO) 5-325 MG per tablet Take 1 tablet by mouth 2 (two) times daily as needed. For pain      . ibuprofen (ADVIL,MOTRIN) 200 MG tablet Take 800 mg by mouth every evening.      . Multiple Vitamins-Minerals (MULTI-VITAMIN GUMMIES PO) Take 1 tablet by mouth daily.      Marland Kitchen  Omega-3 Fatty Acids (FISH OIL) 1000 MG CAPS Take 2 capsules by mouth daily.      . traMADol (ULTRAM) 50 MG tablet Take 50 mg by mouth 2 (two) times daily as needed. For pain       No current facility-administered medications on file prior to visit.    BP 120/76  Pulse 76  Temp(Src) 98.6 F (37 C)  Resp 16  Ht 6' (1.829 m)  Wt 239 lb (108.41 kg)  BMI 32.41 kg/m2        Objective:   Physical Exam  Constitutional: He is oriented to person, place, and time. He appears well-developed and well-nourished.  HENT:  Head: Normocephalic and atraumatic.  Neck: Neck supple.  Cardiovascular: Normal rate and regular rhythm.   No murmur heard. Pulmonary/Chest: Effort normal and breath sounds normal. He has no wheezes.  Lymphadenopathy:    He has no cervical adenopathy.   Neurological: He is alert and oriented to person, place, and time. No cranial nerve deficit.  Skin: Skin is warm and dry.  Psychiatric: He has a normal mood and affect. His behavior is normal.          Assessment & Plan:

## 2012-08-06 NOTE — Patient Instructions (Addendum)
Please complete the following lab tests before your next follow up appointment: BMET, A1c - 790.29 LFTs - 790.4

## 2012-08-06 NOTE — Assessment & Plan Note (Signed)
Well controlled.  No change in medication. BP: 120/76 mmHg  Lab Results  Component Value Date   CREATININE 0.9 07/09/2012

## 2012-08-06 NOTE — Assessment & Plan Note (Signed)
Continue metformin 500 mg twice daily. A1c is stable. Hopefully he will be able to engage in more exercises after left knee replacement.  Patient encouraged trying swimming on a regular basis. Lab Results  Component Value Date   HGBA1C 6.3 07/09/2012

## 2012-08-06 NOTE — Assessment & Plan Note (Signed)
Stable on Livalo.  Patient has mild elevation in ALT. This may be secondary to medication effect versus fatty liver. Monitor LFTs before next office visit.  Lab Results  Component Value Date   CHOL 145 07/09/2012   HDL 36.20* 07/09/2012   LDLCALC 86 07/09/2012   TRIG 112.0 07/09/2012   CHOLHDL 4 07/09/2012   Lab Results  Component Value Date   ALT 74* 07/09/2012   AST 36 07/09/2012   ALKPHOS 46 07/09/2012   BILITOT 0.6 07/09/2012

## 2012-08-14 ENCOUNTER — Other Ambulatory Visit: Payer: Self-pay | Admitting: Internal Medicine

## 2012-08-19 ENCOUNTER — Institutional Professional Consult (permissible substitution): Payer: Managed Care, Other (non HMO) | Admitting: Cardiovascular Disease

## 2012-09-24 ENCOUNTER — Institutional Professional Consult (permissible substitution): Payer: Managed Care, Other (non HMO) | Admitting: Cardiology

## 2012-09-25 ENCOUNTER — Encounter: Payer: Self-pay | Admitting: Internal Medicine

## 2012-09-25 ENCOUNTER — Ambulatory Visit (INDEPENDENT_AMBULATORY_CARE_PROVIDER_SITE_OTHER): Payer: Managed Care, Other (non HMO) | Admitting: Internal Medicine

## 2012-09-25 VITALS — BP 128/86 | HR 74 | Ht 72.0 in | Wt 234.0 lb

## 2012-09-25 DIAGNOSIS — Z01810 Encounter for preprocedural cardiovascular examination: Secondary | ICD-10-CM

## 2012-09-25 DIAGNOSIS — I10 Essential (primary) hypertension: Secondary | ICD-10-CM

## 2012-09-25 DIAGNOSIS — E785 Hyperlipidemia, unspecified: Secondary | ICD-10-CM

## 2012-09-25 NOTE — Patient Instructions (Signed)
NO medication changes were made today

## 2012-09-27 NOTE — Progress Notes (Signed)
HPI Patient is a 62 yo who is being evaluated for knee surgery.  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 Vancomycin Hives  . Heparin     REACTION: breathing problems  . Penicillins     REACTION: Rash  . Succinylcholine Chloride     REACTION: does not wake up from anesthetic    Current Outpatient Prescriptions  Medication Sig Dispense Refill  . acetaminophen (TYLENOL) 500 MG tablet Take 2,000 mg by mouth every morning.      Marland Kitchen amLODipine (NORVASC) 5 MG tablet Take 1 tablet (5 mg total) by mouth daily.  90 tablet  1  . aspirin EC 81 MG tablet Take 81 mg by mouth daily.      Marland Kitchen HYDROcodone-acetaminophen (NORCO) 5-325 MG per tablet Take 1 tablet by mouth 2 (two) times daily as needed. For pain      . ibuprofen (ADVIL,MOTRIN) 200 MG tablet Take 800 mg by mouth every evening.      Marland Kitchen LIVALO 4 MG TABS TAKE 1 TABLET BY MOUTH DAILY  30 tablet  0  . metFORMIN (GLUCOPHAGE) 500 MG tablet Take 1 tablet (500 mg total) by mouth 2 (two) times daily with a meal.  180 tablet  1  . Multiple Vitamins-Minerals (MULTI-VITAMIN GUMMIES PO) Take 1 tablet by mouth daily.      . Omega-3 Fatty Acids (FISH OIL) 1000 MG CAPS Take 2 capsules by mouth daily.      . valsartan (DIOVAN) 160 MG tablet Take 1 tablet (160 mg total) by mouth daily.  90 tablet  1  . zolpidem (AMBIEN) 10 MG tablet Take 1 tablet (10 mg total) by mouth at bedtime as needed.  30 tablet  5   No current facility-administered medications for this visit.    Past Medical History  Diagnosis Date  . Allergy     allergic rhinitis  . Hyperlipidemia   . Headache(784.0)   . Hepatitis     history of infections Hepatitis  . History of ulcerative colitis   . History of melanoma     face and forehead-- previously followed  [place] [facility] [place]  . Diabetes mellitus   . Hypertension   . Cancer     melonoma  . Sleep apnea     last sleep study 10 yrs ago,CPAP    Past Surgical History  Procedure Laterality Date  . Tonsillectomy  1969  . Appendectomy  1969  . Knee surgery      right knee  . Anterior cervical decomp/discectomy fusion  10/15/2011    Procedure: ANTERIOR CERVICAL DECOMPRESSION/DISCECTOMY FUSION 1 LEVEL;  Surgeon: Barnett Abu, MD;  Location: [facility];  Service: Neurosurgery;  Laterality: N/A;  Cervical four-five Anterior cervical decompression/diskectomy/fusion    Family History  Problem Relation Age of Onset  . Cancer Brother     brain tumor  . Diabetes Other   . Cancer Other     ovarian    History   Social History  . Marital Status: Married    Spouse Name: N/A    Number of Children: N/A  . Years of Education: N/A   Occupational History  . Not on file.   Social History Main Topics  .  Smoking status: Never Smoker   . Smokeless tobacco: Former Neurosurgeon    Quit date: 02/26/2003  . Alcohol Use: Yes     Comment: occassionally  . Drug Use: No  . Sexually Active: Not on file   Other Topics Concern  . Not on file   Social History Narrative   Wife works at cath lab at [employer] advanced breast cancer    Review of Systems:  All systems reviewed.  They are negative to the above problem except as previously stated.  Vital Signs: BP 128/86  Pulse 74  Ht 6' (1.829 m)  Wt 234 lb (106.142 kg)  BMI 31.73 kg/m2  Physical Exam Obese 62 yo in NAD HEENT:  Normocephalic, atraumatic. EOMI, PERRLA.  Neck: JVP is normal.  No bruits.  Lungs: clear to auscultation. No rales no wheezes.  Heart: Regular rate and rhythm. Normal S1, S2. No S3.   No significant murmurs. PMI not displaced.  Abdomen:  Supple, nontender. Normal bowel sounds. No masses. No hepatomegaly.  Extremities:   Good distal pulses throughout. No lower extremity edema.  Musculoskeletal :moving all extremities.  Neuro:    alert and oriented x3.  CN II-XII grossly intact.  EKG  SR  Nonspecific ST T wave changes Assessment and Plan:  Impr:  Patient is a 63 yo with History of HTN, HL, obesity  From a cardiac standpoint I think he is a low risk for planned surgery and ok to proceed  HTN  Continue Rx  HL  Continue Rx   Obesity:  Counselled on diet and exercise  He is eager to start more exercising after surgery  F/U PRN

## 2012-09-28 ENCOUNTER — Telehealth: Payer: Self-pay | Admitting: Internal Medicine

## 2012-09-28 NOTE — Telephone Encounter (Signed)
Returned call to patient's wife Dr.[name]'s last office note that stated Dr.[name] ok patient for surgery faxed to Dr.Bean and Cordelia Pen and fax #s listed.

## 2012-09-28 NOTE — Telephone Encounter (Signed)
New Prob  Pt wife said that Dr Veronda Prude office needs to cardiac clearance faxed to the office. They need it faxed to [facility] at [phone] and Dr Jillyn Hidden at [phone]

## 2012-10-05 ENCOUNTER — Other Ambulatory Visit: Payer: Self-pay | Admitting: Orthopedic Surgery

## 2012-10-09 ENCOUNTER — Other Ambulatory Visit: Payer: Self-pay | Admitting: Orthopedic Surgery

## 2012-10-09 ENCOUNTER — Encounter (HOSPITAL_COMMUNITY): Payer: Self-pay | Admitting: Pharmacy Technician

## 2012-10-09 NOTE — H&P (Signed)
Dylan [name] DOB: Jul 16, 1950 Married / Language: English / Race: White Male  H&P date: 10/08/2012  Chief Complaint: left knee pain  History of Present Illness The patient is a 62 year old male who comes in today for a preoperative History and Physical. The patient is scheduled for a left total knee arthroplasty to be performed by Dr. Javier Docker, MD at [facility] on 10-22-12. He has reported now over four years of progressively disabling left knee pain. He has had corticosteroid injection, Visco supplementation with temporarily relief. He has had two arthroscopies, one with a grade IV lesion lateral compartment. He has extensive grade III and progressive grade IV. He is reporting lack of motion, having to use Hydrocodone and requesting a stronger pain medication.  Dr. Shelle Iron and the patient mutually agreed to proceed with a total knee replacement. Risks and benefits of the procedure were discussed including stiffness, suboptimal range of motion, persistent pain, infection requiring removal of prosthesis and reinsertion, need for prophylactic antibiotics in the future, for example, dental procedures, possible need for manipulation, revision in the future and also anesthetic complications including DVT, PE, etc. We discussed the perioperative course, time in the hospital, postoperative recovery and the need for elevation to control swelling. We also discussed the predicted range of motion and the probability that squatting and kneeling would be unobtainable in the future. In addition, postoperative anticoagulation was discussed. We have obtained preoperative medical clearance from Dr. Swaziland. Provided illustrated handout and discussed it in detail. They will enroll in the total joint replacement educational forum at the hospital.  Past Medical Hx Carpal Tunnel Syndrome S/P Carpal Tunnel Release Sleep Apnea. uses CPAP Unspecified Diagnosis High blood pressure Diabetes  Mellitus, Type II Staphlococcus Infections. prior + MRSA test  Allergies Heparin in Sodium Chloride *ANTICOAGULANTS* PCN-200 *Assorted Classes** Succinylcholine Chloride *CHEMICALS* Vancomycin HCl *ANTI-INFECTIVE AGENTS - MISC.*. Red Man syndrome due to rapid infusion rate  Family History Cancer. mother and brother Congestive Heart Failure. grandfather mothers side and grandmother fathers side Diabetes Mellitus. father  Social History No alcohol use Drug/Alcohol Rehab (Previously). no Exercise. Exercises weekly; does running / walking and individual sport Tobacco use. Never smoker. Drug/Alcohol Rehab (Currently). no Current work status. working full time Living situation. live with spouse Children. 3 Pain Contract. yes Marital status. married Number of flights of stairs before winded. 4-5 Illicit drug use. no Alcohol use. current drinker; drinks beer; only occasionally per week Post-Surgical Plans. home with home health, wife and daughters are nurses. two story home, able to rearrange rooms and sleep downstairs initially  Medication History Norco (7.5-325MG  Tablet, 1 (one) Tablet Oral every 6-8 hours as needed for pain, Taken starting 10/02/2012) Active. (jcb/ssj cvs [id number]) Zolpidem Tartrate (10MG  Tablet, Oral) Active. MetFORMIN HCl (500MG  Tablet, Oral) Active. Livalo (2MG  Tablet, Oral) Active. AmLODIPine Besylate (5MG  Tablet, Oral) Active. Aspirin EC (81MG  Tablet DR, Oral) Active. Diovan (160MG  Tablet, Oral) Active. Fish Oil (1000MG  Capsule, Oral) Active. Multivitamin Gummies Adult ( Oral) Active.  Past Surgical History Rotator Cuff Repair. right Arthroscopy of Knee. bilateral Straighten Nasal Septum Sinus Surgery Spinal Fusion - Neck. C3-4  Review of Systems General:Not Present- Chills, Fever, Night Sweats, Fatigue, Weight Gain, Weight Loss and Memory Loss. Skin:Not Present- Hives, Itching, Rash, Eczema and Lesions. HEENT:Not  Present- Tinnitus, Headache, Double Vision, Visual Loss, Hearing Loss and Dentures. Respiratory:Not Present- Shortness of breath with exertion, Shortness of breath at rest, Allergies, Coughing up blood and Chronic Cough. Cardiovascular:Not Present- Chest Pain, Racing/skipping heartbeats, Difficulty  Breathing Lying Down, Murmur, Swelling and Palpitations. Gastrointestinal:Not Present- Bloody Stool, Heartburn, Abdominal Pain, Vomiting, Nausea, Constipation, Diarrhea, Difficulty Swallowing, Jaundice and Loss of appetitie. Male Genitourinary:Not Present- Urinary frequency, Blood in Urine, Weak urinary stream, Discharge, Flank Pain, Incontinence, Painful Urination, Urgency, Urinary Retention and Urinating at Night. Musculoskeletal:Present- Joint Stiffness, Joint Swelling and Joint Pain. Not Present- Muscle Weakness, Muscle Pain, Back Pain, Morning Stiffness and Spasms. Neurological:Not Present- Tremor, Dizziness, Blackout spells, Paralysis, Difficulty with balance and Weakness. Psychiatric:Not Present- Insomnia.  Physical Exam The physical exam findings are as follows:  General Mental Status - Alert, cooperative and good historian. General Appearance- pleasant. Not in acute distress. Orientation- Oriented X3. Build & Nutrition- Well nourished and Well developed. Gait- Antalgic.  Head and Neck Head- normocephalic, atraumatic . Neck Global Assessment- supple. no bruit auscultated on the right and no bruit auscultated on the left.  Eye Pupil- Bilateral- Regular and Round. Motion- Bilateral- EOMI.  Chest and Lung Exam Auscultation: Breath sounds:- clear at anterior chest wall and - clear at posterior chest wall. Adventitious sounds:- No Adventitious sounds.  Cardiovascular Auscultation:Rhythm- Regular rate and rhythm. Heart Sounds- S1 WNL and S2 WNL. Murmurs & Other Heart Sounds:Auscultation of the heart reveals - No  Murmurs.  Abdomen Palpation/Percussion:Tenderness- Abdomen is non-tender to palpation. Rigidity (guarding)- Abdomen is soft. Auscultation:Auscultation of the abdomen reveals - Bowel sounds normal.  Male Genitourinary Not done, not pertinent to present illness  Musculoskeletal On exam he has slight varus deformity. He is exquisitely tender medial joint line. He has patellofemoral pain with compression. He has trace effusion. Range is -5 to 130. He has no instability with varus or valgus stressing. Ipsilateral hip and ankle exam are unremarkable. No anterior drawer. Neurovascularly intact. He walks with an antalgic gait.  X rays show essentially bone on bone arthritis medial compartment, which was nine months ago.  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  Pt scheduled for L TKA by Dr. Shelle Iron on 10/22/12. He has been cleared by PCP Dr. Swaziland. We again discussed risks, complications, alternatives, including but not limited to DVT, PE, infx, bleeding, failure of procedure, need for secondary procedure, anesthesia risk, even death. Discussed post-op protocols. He desires to proceed as scheduled. Remain NPO after MN night before surgery. He will hold ASA and vitamins accordingly. He will attend [facility] pre-op appt next week as scheduled. He has previously tested positive for MRSA although he has not had a MRSA infx, therefore he was given decolonization Rx's. He will follow up 10-14 days post-op for staple removal.  Plan left total knee replacement  Signed electronically by Dorothy Spark, [name] for Dr. Shelle Iron

## 2012-10-12 ENCOUNTER — Other Ambulatory Visit: Payer: Managed Care, Other (non HMO)

## 2012-10-15 ENCOUNTER — Other Ambulatory Visit (HOSPITAL_COMMUNITY): Payer: Self-pay | Admitting: Specialist

## 2012-10-15 NOTE — Patient Instructions (Addendum)
[name]  10/15/2012   Your procedure is scheduled on: 10-22-2012  Report to [facility] at 530 AM.  Call this number if you have problems the morning of surgery [phone]   Remember:BRING CPAP MASK AND TUBING   Do not eat food or drink liquids :After Midnight.     Take these medicines the morning of surgery with A SIP OF WATER: amlodipine                                SEE [facility] PREPARING FOR SURGERY SHEET   Do not wear jewelry, make-up or nail polish.  Do not wear lotions, powders, or perfumes. You may wear deodorant.   Men may shave face and neck.  Do not bring valuables to the hospital. [facility] IS NOT RESPONSIBLE FOR VALUEABLES.  Contacts, dentures or bridgework may not be worn into surgery.  Leave suitcase in the car. After surgery it may be brought to your room.  For patients admitted to the hospital, checkout time is 11:00 AM the day of discharge.   Patients discharged the day of surgery will not be allowed to drive home.  Name and phone number of your driver:  Special Instructions: N/A   Please read over the following fact sheets that you were given: MRSA INFORMATION, INCENTIVE SPIROMETER SHEET, BLOOD FACT SHEET  Call Cain Sieve RN pre op nurse if needed [phone]    FAILURE TO FOLLOW THESE INSTRUCTIONS MAY RESULT IN THE CANCELLATION OF YOUR SURGERY.  PATIENT SIGNATURE___________________________________________  NURSE SIGNATURE_____________________________________________

## 2012-10-16 ENCOUNTER — Ambulatory Visit (HOSPITAL_COMMUNITY)
Admission: RE | Admit: 2012-10-16 | Discharge: 2012-10-16 | Disposition: A | Discharge status: Home / Self Care | Payer: Managed Care, Other (non HMO) | Source: Ambulatory Visit | Attending: Orthopedic Surgery | Admitting: Orthopedic Surgery

## 2012-10-16 ENCOUNTER — Encounter (HOSPITAL_COMMUNITY): Payer: Self-pay

## 2012-10-16 ENCOUNTER — Ambulatory Visit (HOSPITAL_COMMUNITY)
Admission: RE | Admit: 2012-10-16 | Discharge: 2012-10-16 | Disposition: A | Discharge status: Home / Self Care | Payer: Managed Care, Other (non HMO) | Source: Ambulatory Visit | Attending: Specialist | Admitting: Specialist

## 2012-10-16 ENCOUNTER — Encounter (HOSPITAL_COMMUNITY)
Admission: RE | Admit: 2012-10-16 | Discharge: 2012-10-16 | Disposition: A | Discharge status: Home / Self Care | Payer: Managed Care, Other (non HMO) | Source: Ambulatory Visit | Attending: Specialist | Admitting: Specialist

## 2012-10-16 DIAGNOSIS — M1710 Unilateral primary osteoarthritis, unspecified knee: Secondary | ICD-10-CM | POA: Insufficient documentation

## 2012-10-16 DIAGNOSIS — I10 Essential (primary) hypertension: Secondary | ICD-10-CM | POA: Insufficient documentation

## 2012-10-16 DIAGNOSIS — Z01812 Encounter for preprocedural laboratory examination: Secondary | ICD-10-CM | POA: Insufficient documentation

## 2012-10-16 DIAGNOSIS — Z01818 Encounter for other preprocedural examination: Secondary | ICD-10-CM | POA: Insufficient documentation

## 2012-10-16 HISTORY — DX: Other complications of anesthesia, initial encounter: T88.59XA

## 2012-10-16 HISTORY — DX: Adverse effect of unspecified anesthetic, initial encounter: T41.45XA

## 2012-10-16 LAB — CBC
MCH: 30 pg (ref 26.0–34.0)
MCV: 85.5 fL (ref 78.0–100.0)
Platelets: 249 10*3/uL (ref 150–400)
RDW: 12.8 % (ref 11.5–15.5)

## 2012-10-16 LAB — URINALYSIS, ROUTINE W REFLEX MICROSCOPIC
Glucose, UA: NEGATIVE mg/dL
Hgb urine dipstick: NEGATIVE
Ketones, ur: NEGATIVE mg/dL
Protein, ur: NEGATIVE mg/dL

## 2012-10-16 LAB — BASIC METABOLIC PANEL
BUN: 17 mg/dL (ref 6–23)
CO2: 27 mEq/L (ref 19–32)
Calcium: 9.5 mg/dL (ref 8.4–10.5)
Creatinine, Ser: 0.77 mg/dL (ref 0.50–1.35)
Glucose, Bld: 122 mg/dL — ABNORMAL HIGH (ref 70–99)

## 2012-10-20 NOTE — Anesthesia Preprocedure Evaluation (Addendum)
Anesthesia Evaluation  Patient identified by MRN, date of birth, ID band Patient awake    Reviewed: Allergy & Precautions, H&P , NPO status , Patient's Chart, lab work & pertinent test results  History of Anesthesia Complications (+) PSEUDOCHOLINESTERASE DEFICIENCY  Airway Mallampati: II TM Distance: >3 FB Neck ROM: full    Dental  (+) Dental Advisory Given and Chipped Front upper ground down and chipped:   Pulmonary sleep apnea and Continuous Positive Airway Pressure Ventilation ,  breath sounds clear to auscultation  Pulmonary exam normal       Cardiovascular Exercise Tolerance: Good hypertension, Pt. on medications Rhythm:regular Rate:Normal     Neuro/Psych Depression negative neurological ROS  negative psych ROS   GI/Hepatic negative GI ROS, Neg liver ROS, (+) Hepatitis -, A  Endo/Other  diabetes, Well Controlled, Type 2, Oral Hypoglycemic Agents  Renal/GU negative Renal ROS  negative genitourinary   Musculoskeletal   Abdominal   Peds  Hematology negative hematology ROS (+)   Anesthesia Other Findings   Reproductive/Obstetrics negative OB ROS                          Anesthesia Physical Anesthesia Plan  ASA: III  Anesthesia Plan: General   Post-op Pain Management:    Induction: Intravenous  Airway Management Planned: Oral ETT  Additional Equipment:   Intra-op Plan:   Post-operative Plan: Extubation in OR  Informed Consent: I have reviewed the patients History and Physical, chart, labs and discussed the procedure including the risks, benefits and alternatives for the proposed anesthesia with the patient or authorized representative who has indicated his/her understanding and acceptance.   Dental Advisory Given  Plan Discussed with: CRNA and Surgeon  Anesthesia Plan Comments:         Anesthesia Quick Evaluation

## 2012-10-22 ENCOUNTER — Inpatient Hospital Stay (HOSPITAL_COMMUNITY): Payer: Managed Care, Other (non HMO)

## 2012-10-22 ENCOUNTER — Encounter (HOSPITAL_COMMUNITY): Payer: Self-pay | Admitting: Anesthesiology

## 2012-10-22 ENCOUNTER — Inpatient Hospital Stay (HOSPITAL_COMMUNITY): Payer: Managed Care, Other (non HMO) | Admitting: Anesthesiology

## 2012-10-22 ENCOUNTER — Encounter (HOSPITAL_COMMUNITY): Payer: Self-pay

## 2012-10-22 ENCOUNTER — Encounter (HOSPITAL_COMMUNITY)
Admission: RE | Disposition: A | Discharge status: Home Health | Payer: Self-pay | Source: Ambulatory Visit | Attending: Specialist

## 2012-10-22 ENCOUNTER — Inpatient Hospital Stay (HOSPITAL_COMMUNITY)
Admission: RE | Admit: 2012-10-22 | Discharge: 2012-10-24 | DRG: 470 | Disposition: A | Discharge status: Home Health | Payer: Managed Care, Other (non HMO) | Source: Ambulatory Visit | Attending: Specialist | Admitting: Specialist

## 2012-10-22 DIAGNOSIS — Z01812 Encounter for preprocedural laboratory examination: Secondary | ICD-10-CM

## 2012-10-22 DIAGNOSIS — M25562 Pain in left knee: Secondary | ICD-10-CM

## 2012-10-22 DIAGNOSIS — G4730 Sleep apnea, unspecified: Secondary | ICD-10-CM | POA: Diagnosis present

## 2012-10-22 DIAGNOSIS — M1710 Unilateral primary osteoarthritis, unspecified knee: Principal | ICD-10-CM | POA: Diagnosis present

## 2012-10-22 DIAGNOSIS — E119 Type 2 diabetes mellitus without complications: Secondary | ICD-10-CM | POA: Diagnosis present

## 2012-10-22 DIAGNOSIS — M1712 Unilateral primary osteoarthritis, left knee: Secondary | ICD-10-CM

## 2012-10-22 HISTORY — PX: TOTAL KNEE ARTHROPLASTY: SHX125

## 2012-10-22 LAB — GLUCOSE, CAPILLARY
Glucose-Capillary: 172 mg/dL — ABNORMAL HIGH (ref 70–99)
Glucose-Capillary: 115 mg/dL — ABNORMAL HIGH (ref 70–99)

## 2012-10-22 LAB — TYPE AND SCREEN

## 2012-10-22 SURGERY — ARTHROPLASTY, KNEE, TOTAL
Anesthesia: General | Site: Knee | Laterality: Left | Wound class: Clean

## 2012-10-22 MED ORDER — MENTHOL 3 MG MT LOZG
1.0000 | LOZENGE | OROMUCOSAL | Status: DC | PRN
  Filled 2012-10-22: qty 9

## 2012-10-22 MED ORDER — METFORMIN HCL 500 MG PO TABS
500.0000 mg | ORAL_TABLET | Freq: Two times a day (BID) | ORAL | Status: DC
  Administered 2012-10-22 – 2012-10-24 (×4): 500 mg via ORAL
  Filled 2012-10-22 (×6): qty 1

## 2012-10-22 MED ORDER — ZOLPIDEM TARTRATE 10 MG PO TABS
10.0000 mg | ORAL_TABLET | Freq: Every evening | ORAL | Status: DC | PRN
  Administered 2012-10-23: 10 mg via ORAL
  Filled 2012-10-22: qty 1

## 2012-10-22 MED ORDER — RIVAROXABAN 10 MG PO TABS: 10.0000 mg | ORAL_TABLET | Freq: Every day | ORAL | Status: DC

## 2012-10-22 MED ORDER — LACTATED RINGERS IV SOLN: INTRAVENOUS | Status: DC

## 2012-10-22 MED ORDER — INSULIN ASPART 100 UNIT/ML ~~LOC~~ SOLN: 0.0000 [IU] | Freq: Three times a day (TID) | SUBCUTANEOUS | Status: DC

## 2012-10-22 MED ORDER — METOCLOPRAMIDE HCL 5 MG/ML IJ SOLN: 5.0000 mg | Freq: Three times a day (TID) | INTRAMUSCULAR | Status: DC | PRN

## 2012-10-22 MED ORDER — BUPIVACAINE LIPOSOME 1.3 % IJ SUSP
INTRAMUSCULAR | Status: DC | PRN
  Administered 2012-10-22: 20 mL

## 2012-10-22 MED ORDER — OXYCODONE-ACETAMINOPHEN 5-325 MG PO TABS: 1.0000 | ORAL_TABLET | ORAL | Status: DC | PRN

## 2012-10-22 MED ORDER — DOCUSATE SODIUM 100 MG PO CAPS
100.0000 mg | ORAL_CAPSULE | Freq: Two times a day (BID) | ORAL | Status: DC
  Administered 2012-10-22 – 2012-10-24 (×4): 100 mg via ORAL

## 2012-10-22 MED ORDER — ACETAMINOPHEN 650 MG RE SUPP: 650.0000 mg | Freq: Four times a day (QID) | RECTAL | Status: DC | PRN

## 2012-10-22 MED ORDER — METHOCARBAMOL 100 MG/ML IJ SOLN
500.0000 mg | Freq: Four times a day (QID) | INTRAVENOUS | Status: DC | PRN
  Administered 2012-10-22: 500 mg via INTRAVENOUS
  Filled 2012-10-22 (×2): qty 5

## 2012-10-22 MED ORDER — METHOCARBAMOL 500 MG PO TABS: 500.0000 mg | ORAL_TABLET | Freq: Four times a day (QID) | ORAL | Status: DC | PRN

## 2012-10-22 MED ORDER — ONDANSETRON HCL 4 MG PO TABS: 4.0000 mg | ORAL_TABLET | Freq: Four times a day (QID) | ORAL | Status: DC | PRN

## 2012-10-22 MED ORDER — OXYCODONE HCL 5 MG PO TABS
5.0000 mg | ORAL_TABLET | ORAL | Status: DC | PRN
  Administered 2012-10-22 – 2012-10-24 (×15): 10 mg via ORAL
  Filled 2012-10-22 (×15): qty 2

## 2012-10-22 MED ORDER — ONDANSETRON HCL 4 MG/2ML IJ SOLN
INTRAMUSCULAR | Status: DC | PRN
  Administered 2012-10-22: 4 mg via INTRAVENOUS

## 2012-10-22 MED ORDER — AMLODIPINE BESYLATE 5 MG PO TABS
5.0000 mg | ORAL_TABLET | Freq: Every day | ORAL | Status: DC
  Administered 2012-10-23 – 2012-10-24 (×2): 5 mg via ORAL
  Filled 2012-10-22 (×2): qty 1

## 2012-10-22 MED ORDER — NEOSTIGMINE METHYLSULFATE 1 MG/ML IJ SOLN
INTRAMUSCULAR | Status: DC | PRN
  Administered 2012-10-22: 2.5 mg via INTRAVENOUS

## 2012-10-22 MED ORDER — CLINDAMYCIN PHOSPHATE 900 MG/50ML IV SOLN
900.0000 mg | INTRAVENOUS | Status: AC
  Administered 2012-10-22: 900 mg via INTRAVENOUS

## 2012-10-22 MED ORDER — SODIUM CHLORIDE 0.45 % IV SOLN
INTRAVENOUS | Status: DC
  Administered 2012-10-22 – 2012-10-23 (×2): via INTRAVENOUS

## 2012-10-22 MED ORDER — CLINDAMYCIN PHOSPHATE 900 MG/50ML IV SOLN
900.0000 mg | Freq: Four times a day (QID) | INTRAVENOUS | Status: AC
  Administered 2012-10-22 (×2): 900 mg via INTRAVENOUS
  Filled 2012-10-22 (×2): qty 50

## 2012-10-22 MED ORDER — LACTATED RINGERS IV SOLN
INTRAVENOUS | Status: DC | PRN
  Administered 2012-10-22 (×2): via INTRAVENOUS

## 2012-10-22 MED ORDER — GLYCOPYRROLATE 0.2 MG/ML IJ SOLN
INTRAMUSCULAR | Status: DC | PRN
  Administered 2012-10-22: 0.4 mg via INTRAVENOUS

## 2012-10-22 MED ORDER — HYDROMORPHONE HCL PF 1 MG/ML IJ SOLN: 0.2500 mg | INTRAMUSCULAR | Status: DC | PRN

## 2012-10-22 MED ORDER — IRBESARTAN 75 MG PO TABS
75.0000 mg | ORAL_TABLET | Freq: Every day | ORAL | Status: DC
  Administered 2012-10-22 – 2012-10-24 (×3): 75 mg via ORAL
  Filled 2012-10-22 (×3): qty 1

## 2012-10-22 MED ORDER — HYDROMORPHONE HCL PF 1 MG/ML IJ SOLN
INTRAMUSCULAR | Status: DC | PRN
  Administered 2012-10-22 (×2): 0.5 mg via INTRAVENOUS
  Administered 2012-10-22: 1 mg via INTRAVENOUS
  Administered 2012-10-22 (×2): 0.5 mg via INTRAVENOUS

## 2012-10-22 MED ORDER — METOCLOPRAMIDE HCL 10 MG PO TABS: 5.0000 mg | ORAL_TABLET | Freq: Three times a day (TID) | ORAL | Status: DC | PRN

## 2012-10-22 MED ORDER — SODIUM CHLORIDE 0.9 % IR SOLN
Status: DC | PRN
  Administered 2012-10-22: 08:00:00

## 2012-10-22 MED ORDER — BUPIVACAINE LIPOSOME 1.3 % IJ SUSP
20.0000 mL | Freq: Once | INTRAMUSCULAR | Status: DC
  Filled 2012-10-22: qty 20

## 2012-10-22 MED ORDER — INSULIN ASPART 100 UNIT/ML ~~LOC~~ SOLN
0.0000 [IU] | Freq: Three times a day (TID) | SUBCUTANEOUS | Status: DC
  Administered 2012-10-22: 3 [IU] via SUBCUTANEOUS
  Administered 2012-10-23 – 2012-10-24 (×5): 2 [IU] via SUBCUTANEOUS

## 2012-10-22 MED ORDER — FENTANYL CITRATE 0.05 MG/ML IJ SOLN
INTRAMUSCULAR | Status: DC | PRN
  Administered 2012-10-22: 50 ug via INTRAVENOUS
  Administered 2012-10-22 (×3): 100 ug via INTRAVENOUS

## 2012-10-22 MED ORDER — METHOCARBAMOL 500 MG PO TABS
500.0000 mg | ORAL_TABLET | Freq: Four times a day (QID) | ORAL | Status: DC | PRN
  Administered 2012-10-23 – 2012-10-24 (×5): 500 mg via ORAL
  Filled 2012-10-22 (×6): qty 1

## 2012-10-22 MED ORDER — SODIUM CHLORIDE 0.9 % IR SOLN
Status: DC | PRN
  Administered 2012-10-22: 3000 mL

## 2012-10-22 MED ORDER — PHENOL 1.4 % MT LIQD
1.0000 | OROMUCOSAL | Status: DC | PRN
  Filled 2012-10-22: qty 177

## 2012-10-22 MED ORDER — MIDAZOLAM HCL 5 MG/5ML IJ SOLN
INTRAMUSCULAR | Status: DC | PRN
  Administered 2012-10-22 (×2): 1 mg via INTRAVENOUS

## 2012-10-22 MED ORDER — HYDROMORPHONE HCL PF 1 MG/ML IJ SOLN
1.0000 mg | INTRAMUSCULAR | Status: DC | PRN
  Administered 2012-10-22 – 2012-10-24 (×11): 1 mg via INTRAVENOUS
  Filled 2012-10-22 (×11): qty 1

## 2012-10-22 MED ORDER — CEFAZOLIN SODIUM-DEXTROSE 2-3 GM-% IV SOLR
2.0000 g | Freq: Three times a day (TID) | INTRAVENOUS | Status: AC
  Administered 2012-10-22 (×2): 2 g via INTRAVENOUS
  Filled 2012-10-22 (×2): qty 50

## 2012-10-22 MED ORDER — LIDOCAINE HCL (CARDIAC) 20 MG/ML IV SOLN
INTRAVENOUS | Status: DC | PRN
  Administered 2012-10-22: 100 mg via INTRAVENOUS

## 2012-10-22 MED ORDER — KETAMINE HCL 50 MG/ML IJ SOLN
INTRAMUSCULAR | Status: DC | PRN
  Administered 2012-10-22 (×2): 10 mg via INTRAMUSCULAR
  Administered 2012-10-22: 20 mg via INTRAMUSCULAR
  Administered 2012-10-22: 10 mg via INTRAMUSCULAR

## 2012-10-22 MED ORDER — ONDANSETRON HCL 4 MG/2ML IJ SOLN: 4.0000 mg | Freq: Four times a day (QID) | INTRAMUSCULAR | Status: DC | PRN

## 2012-10-22 MED ORDER — ROCURONIUM BROMIDE 100 MG/10ML IV SOLN
INTRAVENOUS | Status: DC | PRN
  Administered 2012-10-22: 60 mg via INTRAVENOUS

## 2012-10-22 MED ORDER — RIVAROXABAN 10 MG PO TABS
10.0000 mg | ORAL_TABLET | Freq: Every day | ORAL | Status: DC
  Administered 2012-10-23 – 2012-10-24 (×2): 10 mg via ORAL
  Filled 2012-10-22 (×3): qty 1

## 2012-10-22 MED ORDER — ACETAMINOPHEN 325 MG PO TABS
650.0000 mg | ORAL_TABLET | Freq: Four times a day (QID) | ORAL | Status: DC | PRN
  Administered 2012-10-23: 650 mg via ORAL
  Filled 2012-10-22: qty 2

## 2012-10-22 MED ORDER — BUPIVACAINE-EPINEPHRINE 0.5% -1:200000 IJ SOLN
INTRAMUSCULAR | Status: DC | PRN
  Administered 2012-10-22: 10 mL

## 2012-10-22 MED ORDER — PROPOFOL 10 MG/ML IV BOLUS
INTRAVENOUS | Status: DC | PRN
  Administered 2012-10-22: 200 mg via INTRAVENOUS

## 2012-10-22 MED ORDER — HYDROMORPHONE HCL PF 1 MG/ML IJ SOLN
INTRAMUSCULAR | Status: AC
  Administered 2012-10-22: 1 mg
  Filled 2012-10-22: qty 1

## 2012-10-22 MED ORDER — CEFAZOLIN SODIUM-DEXTROSE 2-3 GM-% IV SOLR
2.0000 g | INTRAVENOUS | Status: AC
  Administered 2012-10-22: 2 g via INTRAVENOUS

## 2012-10-22 SURGICAL SUPPLY — 63 items
BAG ZIPLOCK 12X15 (MISCELLANEOUS) ×2 IMPLANT
BANDAGE ELASTIC 4 VELCRO ST LF (GAUZE/BANDAGES/DRESSINGS) ×2 IMPLANT
BANDAGE ELASTIC 6 VELCRO ST LF (GAUZE/BANDAGES/DRESSINGS) ×2 IMPLANT
BANDAGE ESMARK 6X9 LF (GAUZE/BANDAGES/DRESSINGS) ×1 IMPLANT
BLADE SAG 18X100X1.27 (BLADE) ×2 IMPLANT
BLADE SAW SGTL 13.0X1.19X90.0M (BLADE) ×2 IMPLANT
BNDG ESMARK 6X9 LF (GAUZE/BANDAGES/DRESSINGS) ×2
CAPT RP KNEE ×2 IMPLANT
CEMENT HV SMART SET (Cement) ×4 IMPLANT
CLOTH 2% CHLOROHEXIDINE 3PK (PERSONAL CARE ITEMS) ×2 IMPLANT
CLOTH BEACON ORANGE TIMEOUT ST (SAFETY) ×2 IMPLANT
CUFF TOURN SGL QUICK 34 (TOURNIQUET CUFF) ×1
CUFF TRNQT CYL 34X4X40X1 (TOURNIQUET CUFF) ×1 IMPLANT
DECANTER SPIKE VIAL GLASS SM (MISCELLANEOUS) ×2 IMPLANT
DRAPE LG THREE QUARTER DISP (DRAPES) ×2 IMPLANT
DRAPE ORTHO SPLIT 77X108 STRL (DRAPES) ×2
DRAPE POUCH INSTRU U-SHP 10X18 (DRAPES) ×2 IMPLANT
DRAPE SURG ORHT 6 SPLT 77X108 (DRAPES) ×2 IMPLANT
DRAPE U-SHAPE 47X51 STRL (DRAPES) ×2 IMPLANT
DRSG AQUACEL AG ADV 3.5X10 (GAUZE/BANDAGES/DRESSINGS) ×2 IMPLANT
DRSG TEGADERM 2-3/8X2-3/4 SM (GAUZE/BANDAGES/DRESSINGS) ×2 IMPLANT
DURAPREP 26ML APPLICATOR (WOUND CARE) ×2 IMPLANT
ELECT REM PT RETURN 9FT ADLT (ELECTROSURGICAL) ×2
ELECTRODE REM PT RTRN 9FT ADLT (ELECTROSURGICAL) ×1 IMPLANT
EVACUATOR 1/8 PVC DRAIN (DRAIN) ×2 IMPLANT
FACESHIELD LNG OPTICON STERILE (SAFETY) ×10 IMPLANT
GAUZE SPONGE 2X2 8PLY STRL LF (GAUZE/BANDAGES/DRESSINGS) ×1 IMPLANT
GLOVE BIOGEL PI IND STRL 7.5 (GLOVE) ×1 IMPLANT
GLOVE BIOGEL PI IND STRL 8 (GLOVE) ×1 IMPLANT
GLOVE BIOGEL PI INDICATOR 7.5 (GLOVE) ×1
GLOVE BIOGEL PI INDICATOR 8 (GLOVE) ×1
GLOVE SURG SS PI 7.5 STRL IVOR (GLOVE) ×2 IMPLANT
GLOVE SURG SS PI 8.0 STRL IVOR (GLOVE) ×2 IMPLANT
GOWN BRE IMP PREV XXLGXLNG (GOWN DISPOSABLE) ×2 IMPLANT
GOWN STRL REIN XL XLG (GOWN DISPOSABLE) ×4 IMPLANT
HANDPIECE INTERPULSE COAX TIP (DISPOSABLE) ×1
IMMOBILIZER KNEE 22 UNIV (SOFTGOODS) ×2 IMPLANT
KIT BASIN OR (CUSTOM PROCEDURE TRAY) ×2 IMPLANT
MANIFOLD NEPTUNE II (INSTRUMENTS) ×2 IMPLANT
NEEDLE HYPO 22GX1.5 SAFETY (NEEDLE) ×4 IMPLANT
NS IRRIG 1000ML POUR BTL (IV SOLUTION) IMPLANT
PACK TOTAL JOINT (CUSTOM PROCEDURE TRAY) ×2 IMPLANT
POSITIONER SURGICAL ARM (MISCELLANEOUS) ×2 IMPLANT
SET HNDPC FAN SPRY TIP SCT (DISPOSABLE) ×1 IMPLANT
SPONGE GAUZE 2X2 STER 10/PKG (GAUZE/BANDAGES/DRESSINGS) ×1
SPONGE SURGIFOAM ABS GEL 100 (HEMOSTASIS) IMPLANT
STAPLER VISISTAT (STAPLE) ×2 IMPLANT
STRIP CLOSURE SKIN 1/2X4 (GAUZE/BANDAGES/DRESSINGS) IMPLANT
SUCTION FRAZIER 12FR DISP (SUCTIONS) ×2 IMPLANT
SUT BONE WAX W31G (SUTURE) IMPLANT
SUT MNCRL AB 3-0 PS2 18 (SUTURE) ×2 IMPLANT
SUT VIC AB 1 CT1 27 (SUTURE) ×2
SUT VIC AB 1 CT1 27XBRD ANTBC (SUTURE) ×2 IMPLANT
SUT VIC AB 2-0 CT1 27 (SUTURE) ×3
SUT VIC AB 2-0 CT1 TAPERPNT 27 (SUTURE) ×3 IMPLANT
SUT VLOC 180 0 24IN GS25 (SUTURE) ×2 IMPLANT
SYR 20CC LL (SYRINGE) ×2 IMPLANT
SYR 30ML LL (SYRINGE) ×2 IMPLANT
TOWEL OR 17X26 10 PK STRL BLUE (TOWEL DISPOSABLE) ×2 IMPLANT
TOWER CARTRIDGE SMART MIX (DISPOSABLE) ×2 IMPLANT
TRAY FOLEY METER SIL LF 16FR (CATHETERS) ×2 IMPLANT
WATER STERILE IRR 1500ML POUR (IV SOLUTION) ×4 IMPLANT
WRAP KNEE MAXI GEL POST OP (GAUZE/BANDAGES/DRESSINGS) ×2 IMPLANT

## 2012-10-22 NOTE — Progress Notes (Signed)
Patient unaware of home CPAP settings. Placed on nocturnal BiPAP auto with 2L O2 bleed in. Humidifier filled to full line with sterile water. Patient verbalized comfort. VSS.

## 2012-10-22 NOTE — Progress Notes (Signed)
Utilization review completed.  

## 2012-10-22 NOTE — Brief Op Note (Signed)
10/22/2012  9:56 AM  PATIENT:  Dylan [name]  62 y.o. male  PRE-OPERATIVE DIAGNOSIS:  DEGENERATIVE JOINT DISEASE  LEFT KNEE  POST-OPERATIVE DIAGNOSIS:  DEGENERATIVE JOINT DISEASE  LEFT KNEE  PROCEDURE:  Procedure(s): TOTAL KNEE ARTHROPLASTY (Left)  SURGEON:  Surgeon(s) and Role:    * Javier Docker, MD - Primary  PHYSICIAN ASSISTANT:   ASSISTANTS: Bissell   ANESTHESIA:   general  EBL:  Total I/O In: 1000 [I.V.:1000] Out: 350 [Urine:300; Blood:50]  BLOOD ADMINISTERED:none  DRAINS: none   LOCAL MEDICATIONS USED:  MARCAINE     SPECIMEN:  No Specimen  DISPOSITION OF SPECIMEN:  N/A  COUNTS:  YES  TOURNIQUET:   Total Tourniquet Time Documented: Thigh (Left) - 106 minutes Total: Thigh (Left) - 106 minutes   DICTATION: .Other Dictation: Dictation Number     [id number]  PLAN OF CARE: Admit to inpatient   PATIENT DISPOSITION:  PACU - hemodynamically stable.   Delay start of Pharmacological VTE agent (>24hrs) due to surgical blood loss or risk of bleeding: no

## 2012-10-22 NOTE — Transfer of Care (Signed)
Immediate Anesthesia Transfer of Care Note  Patient: Dylan [name]  Procedure(s) Performed: Procedure(s): TOTAL KNEE ARTHROPLASTY (Left)  Patient Location: PACU  Anesthesia Type:General  Level of Consciousness: awake, alert  and oriented  Airway & Oxygen Therapy: Patient Spontanous Breathing and Patient connected to face mask oxygen  Post-op Assessment: Report given to PACU RN and Post -op Vital signs reviewed and stable  Post vital signs: Reviewed and stable  Complications: No apparent anesthesia complications

## 2012-10-22 NOTE — Interval H&P Note (Signed)
History and Physical Interval Note:  10/22/2012 7:27 AM  Dylan [name]  has presented today for surgery, with the diagnosis of DEGENERATIVE JOINT DISEASE  LEFT KNEE  The various methods of treatment have been discussed with the patient and family. After consideration of risks, benefits and other options for treatment, the patient has consented to  Procedure(s): TOTAL KNEE ARTHROPLASTY (Left) as a surgical intervention .  The patient's history has been reviewed, patient examined, no change in status, stable for surgery.  I have reviewed the patient's chart and labs.  Questions were answered to the patient's satisfaction.     Micheal Sheen C

## 2012-10-22 NOTE — Preoperative (Signed)
Beta Blockers   Reason not to administer Beta Blockers:Not Applicable 

## 2012-10-22 NOTE — H&P (View-Only) (Signed)
Dylan [name] DOB: 12/29/1950 Married / Language: English / Race: White Male  H&P date: 10/08/2012  Chief Complaint: left knee pain  History of Present Illness The patient is a 61 year old male who comes in today for a preoperative History and Physical. The patient is scheduled for a left total knee arthroplasty to be performed by Dr. Jeffrey C. Beane, MD at [facility] on 10-22-12. He has reported now over four years of progressively disabling left knee pain. He has had corticosteroid injection, Visco supplementation with temporarily relief. He has had two arthroscopies, one with a grade IV lesion lateral compartment. He has extensive grade III and progressive grade IV. He is reporting lack of motion, having to use Hydrocodone and requesting a stronger pain medication.  Dr. Beane and the patient mutually agreed to proceed with a total knee replacement. Risks and benefits of the procedure were discussed including stiffness, suboptimal range of motion, persistent pain, infection requiring removal of prosthesis and reinsertion, need for prophylactic antibiotics in the future, for example, dental procedures, possible need for manipulation, revision in the future and also anesthetic complications including DVT, PE, etc. We discussed the perioperative course, time in the hospital, postoperative recovery and the need for elevation to control swelling. We also discussed the predicted range of motion and the probability that squatting and kneeling would be unobtainable in the future. In addition, postoperative anticoagulation was discussed. We have obtained preoperative medical clearance from Dr. Jordan. Provided illustrated handout and discussed it in detail. They will enroll in the total joint replacement educational forum at the hospital.  Past Medical Hx Carpal Tunnel Syndrome S/P Carpal Tunnel Release Sleep Apnea. uses CPAP Unspecified Diagnosis High blood pressure Diabetes  Mellitus, Type II Staphlococcus Infections. prior + MRSA test  Allergies Heparin in Sodium Chloride *ANTICOAGULANTS* PCN-200 *Assorted Classes** Succinylcholine Chloride *CHEMICALS* Vancomycin HCl *ANTI-INFECTIVE AGENTS - MISC.*. Red Man syndrome due to rapid infusion rate  Family History Cancer. mother and brother Congestive Heart Failure. grandfather mothers side and grandmother fathers side Diabetes Mellitus. father  Social History No alcohol use Drug/Alcohol Rehab (Previously). no Exercise. Exercises weekly; does running / walking and individual sport Tobacco use. Never smoker. Drug/Alcohol Rehab (Currently). no Current work status. working full time Living situation. live with spouse Children. 3 Pain Contract. yes Marital status. married Number of flights of stairs before winded. 4-5 Illicit drug use. no Alcohol use. current drinker; drinks beer; only occasionally per week Post-Surgical Plans. home with home health, wife and daughters are nurses. two story home, able to rearrange rooms and sleep downstairs initially  Medication History Norco (7.5-325MG Tablet, 1 (one) Tablet Oral every 6-8 hours as needed for pain, Taken starting 10/02/2012) Active. (jcb/ssj cvs [id number]) Zolpidem Tartrate (10MG Tablet, Oral) Active. MetFORMIN HCl (500MG Tablet, Oral) Active. Livalo (2MG Tablet, Oral) Active. AmLODIPine Besylate (5MG Tablet, Oral) Active. Aspirin EC (81MG Tablet DR, Oral) Active. Diovan (160MG Tablet, Oral) Active. Fish Oil (1000MG Capsule, Oral) Active. Multivitamin Gummies Adult ( Oral) Active.  Past Surgical History Rotator Cuff Repair. right Arthroscopy of Knee. bilateral Straighten Nasal Septum Sinus Surgery Spinal Fusion - Neck. C3-4  Review of Systems General:Not Present- Chills, Fever, Night Sweats, Fatigue, Weight Gain, Weight Loss and Memory Loss. Skin:Not Present- Hives, Itching, Rash, Eczema and Lesions. HEENT:Not  Present- Tinnitus, Headache, Double Vision, Visual Loss, Hearing Loss and Dentures. Respiratory:Not Present- Shortness of breath with exertion, Shortness of breath at rest, Allergies, Coughing up blood and Chronic Cough. Cardiovascular:Not Present- Chest Pain, Racing/skipping heartbeats, Difficulty   Breathing Lying Down, Murmur, Swelling and Palpitations. Gastrointestinal:Not Present- Bloody Stool, Heartburn, Abdominal Pain, Vomiting, Nausea, Constipation, Diarrhea, Difficulty Swallowing, Jaundice and Loss of appetitie. Male Genitourinary:Not Present- Urinary frequency, Blood in Urine, Weak urinary stream, Discharge, Flank Pain, Incontinence, Painful Urination, Urgency, Urinary Retention and Urinating at Night. Musculoskeletal:Present- Joint Stiffness, Joint Swelling and Joint Pain. Not Present- Muscle Weakness, Muscle Pain, Back Pain, Morning Stiffness and Spasms. Neurological:Not Present- Tremor, Dizziness, Blackout spells, Paralysis, Difficulty with balance and Weakness. Psychiatric:Not Present- Insomnia.  Physical Exam The physical exam findings are as follows:  General Mental Status - Alert, cooperative and good historian. General Appearance- pleasant. Not in acute distress. Orientation- Oriented X3. Build & Nutrition- Well nourished and Well developed. Gait- Antalgic.  Head and Neck Head- normocephalic, atraumatic . Neck Global Assessment- supple. no bruit auscultated on the right and no bruit auscultated on the left.  Eye Pupil- Bilateral- Regular and Round. Motion- Bilateral- EOMI.  Chest and Lung Exam Auscultation: Breath sounds:- clear at anterior chest wall and - clear at posterior chest wall. Adventitious sounds:- No Adventitious sounds.  Cardiovascular Auscultation:Rhythm- Regular rate and rhythm. Heart Sounds- S1 WNL and S2 WNL. Murmurs & Other Heart Sounds:Auscultation of the heart reveals - No  Murmurs.  Abdomen Palpation/Percussion:Tenderness- Abdomen is non-tender to palpation. Rigidity (guarding)- Abdomen is soft. Auscultation:Auscultation of the abdomen reveals - Bowel sounds normal.  Male Genitourinary Not done, not pertinent to present illness  Musculoskeletal On exam he has slight varus deformity. He is exquisitely tender medial joint line. He has patellofemoral pain with compression. He has trace effusion. Range is -5 to 130. He has no instability with varus or valgus stressing. Ipsilateral hip and ankle exam are unremarkable. No anterior drawer. Neurovascularly intact. He walks with an antalgic gait.  X rays show essentially bone on bone arthritis medial compartment, which was nine months ago.  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  Pt scheduled for L TKA by Dr. Beane on 10/22/12. He has been cleared by PCP Dr. Jordan. We again discussed risks, complications, alternatives, including but not limited to DVT, PE, infx, bleeding, failure of procedure, need for secondary procedure, anesthesia risk, even death. Discussed post-op protocols. He desires to proceed as scheduled. Remain NPO after MN night before surgery. He will hold ASA and vitamins accordingly. He will attend [facility] pre-op appt next week as scheduled. He has previously tested positive for MRSA although he has not had a MRSA infx, therefore he was given decolonization Rx's. He will follow up 10-14 days post-op for staple removal.  Plan left total knee replacement  Signed electronically by Jaclyn M Bissell, [name] for Dr. Beane  

## 2012-10-22 NOTE — Anesthesia Postprocedure Evaluation (Signed)
  Anesthesia Post-op Note  Patient: Dylan [name]  Procedure(s) Performed: Procedure(s) (LRB): TOTAL KNEE ARTHROPLASTY (Left)  Patient Location: PACU  Anesthesia Type: General  Level of Consciousness: awake and alert   Airway and Oxygen Therapy: Patient Spontanous Breathing  Post-op Pain: mild  Post-op Assessment: Post-op Vital signs reviewed, Patient's Cardiovascular Status Stable, Respiratory Function Stable, Patent Airway and No signs of Nausea or vomiting  Last Vitals:  Filed Vitals:   10/22/12 1204  BP: 143/87  Pulse: 94  Temp: 36.7 C  Resp: 12    Post-op Vital Signs: stable   Complications: No apparent anesthesia complications

## 2012-10-23 ENCOUNTER — Encounter (HOSPITAL_COMMUNITY): Payer: Self-pay | Admitting: Specialist

## 2012-10-23 LAB — BASIC METABOLIC PANEL
BUN: 13 mg/dL (ref 6–23)
CO2: 29 mEq/L (ref 19–32)
Chloride: 98 mEq/L (ref 96–112)
GFR calc Af Amer: >90 mL/min (ref >90)
Glucose, Bld: 136 mg/dL — ABNORMAL HIGH (ref 70–99)
Potassium: 3.5 mEq/L (ref 3.5–5.1)

## 2012-10-23 LAB — CBC
HCT: 36.6 % — ABNORMAL LOW (ref 39.0–52.0)
Hemoglobin: 12.6 g/dL — ABNORMAL LOW (ref 13.0–17.0)
WBC: 11.5 10*3/uL — ABNORMAL HIGH (ref 4.0–10.5)

## 2012-10-23 LAB — GLUCOSE, CAPILLARY
Glucose-Capillary: 143 mg/dL — ABNORMAL HIGH (ref 70–99)
Glucose-Capillary: 127 mg/dL — ABNORMAL HIGH (ref 70–99)

## 2012-10-23 NOTE — Evaluation (Signed)
Occupational Therapy Evaluation Patient Details Name: Dylan [name] MRN: [id number] DOB: [birth year] Today's Date: 10/23/2012 Time: 4098-1191 OT Time Calculation (min): 13 min  OT Assessment / Plan / Recommendation History of present illness     Clinical Impression   Pt was admitted fro L TKA.  All education was completed.  Pt does not need any further OT at this time.      OT Assessment  Patient does not need any further OT services    Follow Up Recommendations  No OT follow up    Barriers to Discharge      Equipment Recommendations  None recommended by OT    Recommendations for Other Services    Frequency       Precautions / Restrictions Precautions Precautions: Knee Required Braces or Orthoses: Knee Immobilizer - Left Knee Immobilizer - Left: Discontinue once straight leg raise with < 10 degree lag Restrictions Weight Bearing Restrictions: No Other Position/Activity Restrictions: WBAT LLE   Pertinent Vitals/Pain 5/10 L knee; repositioned with ice    ADL  Grooming: Set up Where Assessed - Grooming: Unsupported sitting Upper Body Bathing: Set up Where Assessed - Upper Body Bathing: Unsupported sitting Lower Body Bathing: Minimal assistance Where Assessed - Lower Body Bathing: Supported sit to stand Upper Body Dressing: Set up Where Assessed - Upper Body Dressing: Unsupported sitting Lower Body Dressing: Moderate assistance Where Assessed - Lower Body Dressing: Supported sit to Pharmacist, hospital: Minimal assistance;Simulated (bed to chair) Toilet Transfer Method: Sit to stand Toileting - Architect and Hygiene: Min guard Where Assessed - Toileting Clothing Manipulation and Hygiene: Sit to stand from 3-in-1 or toilet Equipment Used: Rolling walker Transfers/Ambulation Related to ADLs: cues for step length/walker distance when walking ADL Comments: pt's wife recently had TKA. They have all DME.  provided handout for shower sequence but this is  upstairs:  pt will sponge bathe initially.  Wife will assist with adls prn    OT Diagnosis:    OT Problem List:   OT Treatment Interventions:     OT Goals(Current goals can be found in the care plan section) Acute Rehab OT Goals Patient Stated Goal: Resume previous lifestyle with decreased pain  Visit Information  Last OT Received On: 10/23/12 Assistance Needed: +1 PT/OT Co-Evaluation/Treatment: Yes       Prior Functioning     Home Living Family/patient expects to be discharged to:: Private residence Living Arrangements: Spouse/significant other Available Help at Discharge: Family Type of Home: House Home Access: Stairs to enter Secretary/administrator of Steps: 5 Entrance Stairs-Rails: Right;Left;Can reach both Home Layout: Two level Alternate Level Stairs-Number of Steps: 14 Alternate Level Stairs-Rails: Right Home Equipment: Walker - 2 wheels;Crutches Prior Function Level of Independence: Independent Communication Communication: No difficulties         Vision/Perception     Cognition  Cognition Arousal/Alertness: Awake/alert Behavior During Therapy: WFL for tasks assessed/performed Overall Cognitive Status: Within Functional Limits for tasks assessed    Extremity/Trunk Assessment Upper Extremity Assessment Upper Extremity Assessment: Overall WFL for tasks assessed Cervical / Trunk Assessment Cervical / Trunk Assessment: Normal     Mobility Bed Mobility Bed Mobility: Supine to Sit Supine to Sit: 4: Min assist Details for Bed Mobility Assistance: cues for sequence and use of UEs to self assist Transfers Transfers: Sit to Stand Sit to Stand: 4: Min assist;From bed;From elevated surface;With upper extremity assist Stand to Sit: 4: Min assist;With armrests;To chair/3-in-1 Details for Transfer Assistance: cues for UE and LE placement  Exercise    Balance     End of Session OT - End of Session Activity Tolerance: Patient tolerated treatment  well Patient left: in chair;with call bell/phone within reach  GO     [facility] 10/23/2012, 11:50 AM Marica Otter, OTR/L [phone] 10/23/2012

## 2012-10-23 NOTE — Progress Notes (Signed)
Physical Therapy Treatment Patient Details Name: Dylan [name] MRN: [id number] DOB: 23-Jun-1950 Today's Date: 10/23/2012 Time: 6578-4696 PT Time Calculation (min): 24 min  PT Assessment / Plan / Recommendation  History of Present Illness     PT Comments     Follow Up Recommendations  Home health PT     Does the patient have the potential to tolerate intense rehabilitation     Barriers to Discharge        Equipment Recommendations  None recommended by PT    Recommendations for Other Services OT consult  Frequency 7X/week   Progress towards PT Goals Progress towards PT goals: Progressing toward goals  Plan Current plan remains appropriate    Precautions / Restrictions Precautions Precautions: Knee Required Braces or Orthoses: Knee Immobilizer - Left Knee Immobilizer - Left: Discontinue once straight leg raise with < 10 degree lag Restrictions Weight Bearing Restrictions: No Other Position/Activity Restrictions: WBAT LLE   Pertinent Vitals/Pain 6-7/10; premed, RN aware, ice packs provided    Mobility  Bed Mobility Bed Mobility: Sit to Supine Sit to Supine: 4: Min assist Details for Bed Mobility Assistance: cues for sequence and use of UEs to self assist Transfers Transfers: Sit to Stand;Stand to Sit Sit to Stand: 4: Min guard;From bed;With upper extremity assist Stand to Sit: 4: Min guard;With upper extremity assist;To bed Details for Transfer Assistance: cues for UE and LE placement Ambulation/Gait Ambulation/Gait Assistance: 4: Min guard Ambulation Distance (Feet): 75 Feet Assistive device: Rolling walker Ambulation/Gait Assistance Details: cues for position from RW and posture Gait Pattern: Step-to pattern;Decreased step length - right;Decreased step length - left;Trunk flexed;Antalgic    Exercises Total Joint Exercises Ankle Circles/Pumps: AROM;10 reps;Supine;Both Quad Sets: AROM;Both;10 reps;Supine Heel Slides: AAROM;10 reps;Left;Supine Straight Leg  Raises: AAROM;Left;10 reps;Supine Goniometric ROM: AAROM at knee --10 - 50   PT Diagnosis:    PT Problem List:   PT Treatment Interventions:     PT Goals (current goals can now be found in the care plan section) Acute Rehab PT Goals Patient Stated Goal: Resume previous lifestyle with decreased pain PT Goal Formulation: With patient Time For Goal Achievement: 10/30/12 Potential to Achieve Goals: Good  Visit Information  Last PT Received On: 10/23/12 Assistance Needed: +1    Subjective Data  Patient Stated Goal: Resume previous lifestyle with decreased pain   Cognition  Cognition Arousal/Alertness: Awake/alert Behavior During Therapy: WFL for tasks assessed/performed Overall Cognitive Status: Within Functional Limits for tasks assessed    Balance     End of Session PT - End of Session Equipment Utilized During Treatment: Left knee immobilizer;Gait belt Activity Tolerance: Patient tolerated treatment well Patient left: in bed;with call bell/phone within reach;with family/visitor present Nurse Communication: Mobility status   GP     Karsen Nakanishi 10/23/2012, 4:18 PM

## 2012-10-23 NOTE — Progress Notes (Signed)
Patient ID: Dylan [name], male   DOB: 1950/05/30, 62 y.o.   MRN: [id number] Subjective: 1 Day Post-Op Procedure(s) (LRB): TOTAL KNEE ARTHROPLASTY (Left) Patient reports pain as moderate.    Patient has complaints of left knee pain, improved this AM compared to last night  We will start therapy today. Plan is to go home with HHPT after hospital stay.  Objective: Vital signs in last 24 hours: Temp:  [97.4 F (36.3 C)-99.2 F (37.3 C)] 98.3 F (36.8 C) (08/29 0619) Pulse Rate:  [77-101] 90 (08/29 0619) Resp:  [11-16] 16 (08/29 0619) BP: (132-152)/(56-87) 135/56 mmHg (08/29 0619) SpO2:  [94 %-100 %] 98 % (08/29 0619) Weight:  [106.142 kg (234 lb)] 106.142 kg (234 lb) (08/28 1204)  Intake/Output from previous day:  Intake/Output Summary (Last 24 hours) at 10/23/12 0754 Last data filed at 10/23/12 9629  Gross per 24 hour  Intake   4525 ml  Output   2755 ml  Net   1770 ml    Intake/Output this shift:    Labs: Results for orders placed during the hospital encounter of 10/22/12  GLUCOSE, CAPILLARY      Result Value Range   Glucose-Capillary 172 (*) 70 - 99 mg/dL   Comment 1 Documented in Chart    GLUCOSE, CAPILLARY      Result Value Range   Glucose-Capillary 115 (*) 70 - 99 mg/dL   Comment 1 Documented in Chart    GLUCOSE, CAPILLARY      Result Value Range   Glucose-Capillary 158 (*) 70 - 99 mg/dL   Comment 1 Documented in Chart     Comment 2 Notify RN    CBC      Result Value Range   WBC 11.5 (*) 4.0 - 10.5 K/uL   RBC 4.23  4.22 - 5.81 MIL/uL   Hemoglobin 12.6 (*) 13.0 - 17.0 g/dL   HCT 52.8 (*) 41.3 - 24.4 %   MCV 86.5  78.0 - 100.0 fL   MCH 29.8  26.0 - 34.0 pg   MCHC 34.4  30.0 - 36.0 g/dL   RDW 01.0  27.2 - 53.6 %   Platelets 216  150 - 400 K/uL  BASIC METABOLIC PANEL      Result Value Range   Sodium 133 (*) 135 - 145 mEq/L   Potassium 3.5  3.5 - 5.1 mEq/L   Chloride 98  96 - 112 mEq/L   CO2 29  19 - 32 mEq/L   Glucose, Bld 136 (*) 70 - 99 mg/dL   BUN 13   6 - 23 mg/dL   Creatinine, Ser 6.44  0.50 - 1.35 mg/dL   Calcium 8.7  8.4 - 03.4 mg/dL   GFR calc non Af Amer >90  >90 mL/min   GFR calc Af Amer >90  >90 mL/min  GLUCOSE, CAPILLARY      Result Value Range   Glucose-Capillary 97  70 - 99 mg/dL  GLUCOSE, CAPILLARY      Result Value Range   Glucose-Capillary 124 (*) 70 - 99 mg/dL  GLUCOSE, CAPILLARY      Result Value Range   Glucose-Capillary 143 (*) 70 - 99 mg/dL   Comment 1 Notify RN     Comment 2 Documented in Chart    TYPE AND SCREEN      Result Value Range   ABO/RH(D) A POS     Antibody Screen NEG     Sample Expiration 10/25/2012    ABO/RH  Result Value Range   ABO/RH(D) A POS      Exam - Neurologically intact ABD soft Neurovascular intact Sensation intact distally Intact pulses distally Dorsiflexion/Plantar flexion intact Incision: dressing C/D/I and no drainage No cellulitis present Compartment soft no calf pain or sign of DVT Dressing - clean, dry, no drainage Motor function intact - moving foot and toes well on exam.  Hemovac pulled without difficulty.  Assessment/Plan: 1 Day Post-Op Procedure(s) (LRB): TOTAL KNEE ARTHROPLASTY (Left)  Advance diet Up with therapy D/C IV fluids Past Medical History  Diagnosis Date  . Allergy     allergic rhinitis  . Hyperlipidemia   . History of ulcerative colitis     non recent flares  . History of melanoma 15 yrs ago    face and forehead-- previously [place] in [place]  . Diabetes mellitus   . Hypertension   . Sleep apnea     last sleep study 10 yrs ago,CPAP  . Complication of anesthesia     trouble waking up with succycholine, does not have enzyme to break down succycholine  . Hepatitis age 53    history of infections Hepatitis type a  . Headache(784.0)   . Cancer     melonoma    DVT Prophylaxis - Xarelto Protocol Weight-Bearing as tolerated to left leg Keep foley until tomorrow. No vaccines. Will discuss with Dr. Elissa Lovett, Dayna Barker. 10/23/2012, 7:54 AM

## 2012-10-23 NOTE — Care Management Note (Signed)
    Page 1 of 2   10/24/2012     1:20:33 PM   CARE MANAGEMENT NOTE 10/24/2012  Patient:  Dylan [name], Dylan [name]   Account Number:  [id number]  Date Initiated:  10/23/2012  Documentation initiated by:  Colleen Can  Subjective/Objective Assessment:   DX DJD LEFT KNEE; Total knee replacement     Action/Plan:   CM spoke with patient and spouse. Plans are for patient to return to his home in [place] where spouse will be caregiver. He is requesting [facility] for HHpt services and wants specific physical therapist. Already has DME.   Anticipated DC Date:  10/24/2012   Anticipated DC Plan:  HOME W HOME HEALTH SERVICES      DC Planning Services  CM consult      [facility] Choice  HOME HEALTH   Choice offered to / List presented to:  C-1 Patient        HH arranged  HH-2 PT      [facility] agency  [facility]   Status of service:  Completed, signed off Medicare Important Message given?   (If response is "NO", the following Medicare IM given date fields will be blank) Date Medicare IM given:   Date Additional Medicare IM given:    Discharge Disposition:  HOME W HOME HEALTH SERVICES  Per UR Regulation:  Reviewed for med. necessity/level of care/duration of stay  If discussed at Long Length of Stay Meetings, dates discussed:    Comments:  10/24/12 Erby Sanderson RN,BSN NCM [phone] [facility] JAMIE REP AWARE OF D/C HOME TODAY & HHPT ORDER.NO FURTHER D/C NEEDS.  10/23/2012 Colleen Can BSN RN CCM [phone] [facility] rep notified of service request; advised that they will be able to service patient. Services will start day after patient is discharged.

## 2012-10-23 NOTE — Evaluation (Signed)
Physical Therapy Evaluation Patient Details Name: Dylan [name] MRN: [id number] DOB: 1950-06-16 Today's Date: 10/23/2012 Time: 3086-5784 PT Time Calculation (min): 37 min  PT Assessment / Plan / Recommendation History of Present Illness     Clinical Impression  Pt s/p L TKR presents with decreased L LE strength/ROM and post op pain limiting functional mobility.  Pt should progress to d/c home with family assist and HHPT follow up.    PT Assessment  Patient needs continued PT services    Follow Up Recommendations  Home health PT    Does the patient have the potential to tolerate intense rehabilitation      Barriers to Discharge        Equipment Recommendations  None recommended by PT    Recommendations for Other Services OT consult   Frequency 7X/week    Precautions / Restrictions Precautions Precautions: Knee Required Braces or Orthoses: Knee Immobilizer - Left Knee Immobilizer - Left: Discontinue once straight leg raise with < 10 degree lag Restrictions Weight Bearing Restrictions: No Other Position/Activity Restrictions: WBAT LLE   Pertinent Vitals/Pain 5/10; premed; cold packs provided      Mobility  Bed Mobility Bed Mobility: Supine to Sit Supine to Sit: 4: Min assist Details for Bed Mobility Assistance: cues for sequence and use of UEs to self assist Transfers Transfers: Sit to Stand;Stand to Sit Sit to Stand: 4: Min assist;From bed;From elevated surface;With upper extremity assist Stand to Sit: 4: Min assist;With armrests;To chair/3-in-1 Details for Transfer Assistance: cues for UE and LE placement Ambulation/Gait Ambulation/Gait Assistance: 4: Min assist Ambulation Distance (Feet): 54 Feet Assistive device: Rolling walker Ambulation/Gait Assistance Details: cues for sequence, posture, stride length and position from RW Gait Pattern: Step-to pattern;Decreased step length - right;Decreased step length - left;Trunk flexed Stairs: No    Exercises Total  Joint Exercises Ankle Circles/Pumps: AROM;10 reps;Supine;Both Quad Sets: AROM;Both;10 reps;Supine Heel Slides: AAROM;10 reps;Left;Supine Straight Leg Raises: AAROM;Left;10 reps;Supine   PT Diagnosis: Difficulty walking  PT Problem List: Decreased strength;Decreased range of motion;Decreased activity tolerance;Decreased mobility;Decreased knowledge of use of DME;Pain PT Treatment Interventions: DME instruction;Gait training;Stair training;Functional mobility training;Therapeutic activities;Therapeutic exercise;Patient/family education     PT Goals(Current goals can be found in the care plan section) Acute Rehab PT Goals Patient Stated Goal: Resume previous lifestyle with decreased pain PT Goal Formulation: With patient Time For Goal Achievement: 10/30/12 Potential to Achieve Goals: Good  Visit Information  Last PT Received On: 10/01/12 Assistance Needed: +1 PT/OT Co-Evaluation/Treatment: Yes       Prior Functioning  Home Living Family/patient expects to be discharged to:: Private residence Living Arrangements: Spouse/significant other Available Help at Discharge: Family Type of Home: House Home Access: Stairs to enter Secretary/administrator of Steps: 5 Entrance Stairs-Rails: Right;Left;Can reach both Home Layout: Two level Alternate Level Stairs-Number of Steps: 14 Alternate Level Stairs-Rails: Right Home Equipment: Walker - 2 wheels;Crutches Prior Function Level of Independence: Independent Communication Communication: No difficulties    Cognition  Cognition Arousal/Alertness: Awake/alert Behavior During Therapy: WFL for tasks assessed/performed Overall Cognitive Status: Within Functional Limits for tasks assessed    Extremity/Trunk Assessment Upper Extremity Assessment Upper Extremity Assessment: Overall WFL for tasks assessed Lower Extremity Assessment Lower Extremity Assessment: LLE deficits/detail LLE Deficits / Details: 2+/5 quads with AAROM -12 - 40 at  knee Cervical / Trunk Assessment Cervical / Trunk Assessment: Normal   Balance    End of Session    GP     Dylan [name] 10/23/2012, 11:42 AM

## 2012-10-23 NOTE — Op Note (Signed)
NAMEDISHAWN, BHARGAVA NO.:  [id number]  MEDICAL RECORD NO.:  [id number]  LOCATION:  1602                         FACILITY:  [facility]  PHYSICIAN:  Jene Every, M.D.    DATE OF BIRTH:  December 02, 1950  DATE OF PROCEDURE:  10/22/2012 DATE OF DISCHARGE:                              OPERATIVE REPORT   PREOPERATIVE DIAGNOSIS:  End-stage osteoarthritis of the left knee.  POSTOPERATIVE DIAGNOSIS:  End-stage osteoarthritis of the left knee.  PROCEDURE PERFORMED:  Left total knee arthroplasty.  COMPONENTS UTILIZED:  DePuy rotating platform, 5 femur, 5 tibia, 10 mm insert, 38 patella.  ASSISTANT:  Lanna Poche, PA was required throughout the case for assistance, reduction of the components, and retraction.  BRIEF HISTORY:  This is a 62 year old gentleman, who has had end-age osteoarthrosis of the knee bone on bone, medial compartment, refractory to rest, activity modification, corticosteroid injections, 2 arthroscopies, had disabling symptoms, used an assistive device.  Bone on bone change was indicated for placement of the degenerated joint failing conservative treatment.  Risks and benefits discussed including bleeding, infection, damage to neurovascular structures, DVT, PE, anesthetic complications, arthrofibrosis, need for revision, etc.  TECHNIQUE:  With the patient in supine position, after induction of adequate general anesthesia, 2 g Kefzol and 900 clindamycin due to the patient's history of MRSA exposure, the left lower extremity was prepped, draped, and exsanguinated in usual sterile fashion.  Thigh tourniquet was inflated to 300 mmHg.  Midline incision was based upon the patella.  Full-thickness flaps and median parapatellar arthrotomy performed.  Patella everted, knee flexed.  Tricompartmental osteoarthrosis was noted.  Clear fluid evacuated.  We elevated the soft tissues medially preserving the MCL attachment.  I removed the remnants of the medial  lateral menisci as well as the ACL.  There was a grade 4 changes in all 3 compartments noted.  Hypertrophic synovitis was excised as well.  Step drill was utilized to enter the femoral canal.  Irrigated 5-degree intramedullary guide was left in place.  __________ of the distal femur was selected due to slight flexion contracture.  __________ oscillating saw performed the distal femoral cut.  This was then sized off the anterior femur to a 5.  It was pinned in 3 degrees of external rotation, followed by a cutting block.  Anterior, posterior, and chamfer cuts were performed.  Next, attention turned towards the tibia, fairly tight knee.  We released the PCL and subluxed the tibia with a Mikhail, sharp and dull, posteriorly protecting the soft tissues at all times. External alignment guide was utilized bisecting the tibiotalar joint in line with the 1st and 2nd rays parallel to the tibial shaft.  A 10 was selected and taken off the high side medially.  It was equivalent to the contralateral side.  It was pinned, oscillating saw performed the tibial cut.  This was then planed and we then used a spacer guide for flexion and extension at 10 mm and they were equivalent with good stability and attention was turned towards completing the tibia with it subluxed tissues protected, it  measured to a 5 maximizing the coverage just medial to the tibial tubercle.  This was then pinned, centrally  drilled, and then the punch guide utilized.  I turned my attention towards completing the femur, a box cut jig was then applied and flushed to the lateral femur bisecting the canal and the box cut was then performed, rasped.  A trial femur was placed with excellent fit.  With 10-mm insert, we had full extension and flexion and good stability with varus valgus stressing at 0 and 30 degrees.  Patella was measured.  Actually, we had a fairly thick patella at 29.  It measured though to a 38 in size and we actually  planed it to a 20 or just under that to 19 mm utilizing the parallel patellar resurfacing guide.  We then medialized our PEG holes for the patella, drilling all 3 and placing a trial and then checking the tracking of the patella, which was satisfactory in flexion and extension.  Negative anterior drawer was noted as well.  Next, all instrumentation was then removed.  We posteriorly removed any remnants of the menisci and loose bodies.  Geniculates were cauterized. Pulsatile lavage was utilized to thoroughly clean the surfaces.  Knee was flexed, tibia subluxed, all surfaces fully dried.  Cement was mixed in appropriate fashion, injected into the proximal tibia.  We then digitally pressurized the cement, impacted __________ tibial tray with redundant cement removed.  I cemented the femur and redundant cement removed.  Placed a trial 10 and reduced the knee and held it with an axial load throughout the curing of the cement, and then cemented the patella and clamped it as well.  After curing of the cement, the knee was ranged fully stable.  I removed the insert and meticulously removed any redundant cement with an osteotome.  Copiously irrigated with antibiotic irrigation.  I placed a 10 mm permanent polyethylene insert, reduced the knee and it was stable.  We had full extension, full flexion of 140, good stability, varus valgus stressing, and excellent patellofemoral tracking.  Following this, placed a Hemovac and brought out through a lateral stab wound in the skin.  Used Exparel and injected into the perimuscular tissues and Marcaine for the periosteum.  With the knee in slight flexion, we approximated the patellar arthrotomy with 1 Vicryl and then closed it with running V-Loc, subcu with 2-0 and skin was staples.  __________ flexion to gravity 90 degrees.  Good stability with varus valgus stressing.  Wounds were dressed sterilely.  Tourniquet was deflated with adequate revascularization of  lower extremity appreciated.  The patient tolerated the procedure well.  No complications.  He was transported to the recovery room in satisfactory condition.  Minimal blood loss.     Jene Every, M.D.     Cordelia Pen  D:  10/22/2012  T:  10/22/2012  Job:  [id number]

## 2012-10-24 LAB — CBC
MCV: 86.7 fL (ref 78.0–100.0)
Platelets: 213 10*3/uL (ref 150–400)
RDW: 13.1 % (ref 11.5–15.5)
WBC: 11.1 10*3/uL — ABNORMAL HIGH (ref 4.0–10.5)

## 2012-10-24 LAB — GLUCOSE, CAPILLARY: Glucose-Capillary: 117 mg/dL — ABNORMAL HIGH (ref 70–99)

## 2012-10-24 MED ORDER — DSS 100 MG PO CAPS: 100.0000 mg | ORAL_CAPSULE | Freq: Two times a day (BID) | ORAL | Status: DC

## 2012-10-24 MED ORDER — ASPIRIN EC 81 MG PO TBEC: 81.0000 mg | DELAYED_RELEASE_TABLET | Freq: Every day | ORAL | Status: DC

## 2012-10-24 NOTE — Discharge Summary (Signed)
Physician Discharge Summary   Patient ID: Dylan [name] MRN: [id number] DOB/AGE: 03/08/1950 62 y.o.  Admit date: 10/22/2012 Discharge date:   Primary Diagnosis: left knee DJD  Admission Diagnoses:  Past Medical History  Diagnosis Date  . Allergy     allergic rhinitis  . Hyperlipidemia   . History of ulcerative colitis     non recent flares  . History of melanoma 15 yrs ago    face and forehead-- previously followed by [place] in [place]  . Diabetes mellitus   . Hypertension   . Sleep apnea     last sleep study 10 yrs ago,CPAP  . Complication of anesthesia     trouble waking up with succycholine, does not have enzyme to break down succycholine  . Hepatitis age 62    history of infections Hepatitis type a  . Headache(784.0)   . Cancer     melonoma   Discharge Diagnoses:   Principal Problem:   Left knee DJD  Estimated body mass index is 31.73 kg/(m^2) as calculated from the following:   Height as of this encounter: 6' (1.829 m).   Weight as of this encounter: 106.142 kg (234 lb).  Procedure:  Procedure(s) (LRB): TOTAL KNEE ARTHROPLASTY (Left)   Consults: None  HPI: see H&P Laboratory Data: Admission on 10/22/2012  Component Date Value Range Status  . ABO/RH(D) 10/22/2012 A POS   Final  . Antibody Screen 10/22/2012 NEG   Final  . Sample Expiration 10/22/2012 10/25/2012   Final  . Glucose-Capillary 10/22/2012 172* 70 - 99 mg/dL Final  . Comment 1 40/98/1191 Documented in Chart   Final  . ABO/RH(D) 10/22/2012 A POS   Final  . Glucose-Capillary 10/22/2012 115* 70 - 99 mg/dL Final  . Comment 1 47/82/9562 Documented in Chart   Final  . Glucose-Capillary 10/22/2012 158* 70 - 99 mg/dL Final  . Comment 1 13/09/6576 Documented in Chart   Final  . Comment 2 10/22/2012 Notify RN   Final  . WBC 10/23/2012 11.5* 4.0 - 10.5 K/uL Final  . RBC 10/23/2012 4.23  4.22 - 5.81 MIL/uL Final  . Hemoglobin 10/23/2012 12.6* 13.0 - 17.0 g/dL Final  . HCT 46/96/2952 36.6* 39.0 - 52.0 %  Final  . MCV 10/23/2012 86.5  78.0 - 100.0 fL Final  . MCH 10/23/2012 29.8  26.0 - 34.0 pg Final  . MCHC 10/23/2012 34.4  30.0 - 36.0 g/dL Final  . RDW 84/13/2440 13.3  11.5 - 15.5 % Final  . Platelets 10/23/2012 216  150 - 400 K/uL Final  . Sodium 10/23/2012 133* 135 - 145 mEq/L Final  . Potassium 10/23/2012 3.5  3.5 - 5.1 mEq/L Final  . Chloride 10/23/2012 98  96 - 112 mEq/L Final  . CO2 10/23/2012 29  19 - 32 mEq/L Final  . Glucose, Bld 10/23/2012 136* 70 - 99 mg/dL Final  . BUN 12/22/2534 13  6 - 23 mg/dL Final  . Creatinine, Ser 10/23/2012 0.79  0.50 - 1.35 mg/dL Final  . Calcium 64/40/3474 8.7  8.4 - 10.5 mg/dL Final  . GFR calc non Af Amer 10/23/2012 >90  >90 mL/min Final  . GFR calc Af Amer 10/23/2012 >90  >90 mL/min Final   Comment: (NOTE)                          The eGFR has been calculated using the CKD EPI equation.  This calculation has not been validated in all clinical situations.                          eGFR's persistently <90 mL/min signify possible Chronic Kidney                          Disease.  . Glucose-Capillary 10/22/2012 97  70 - 99 mg/dL Final  . Glucose-Capillary 10/22/2012 124* 70 - 99 mg/dL Final  . Glucose-Capillary 10/23/2012 143* 70 - 99 mg/dL Final  . Comment 1 81/19/1478 Notify RN   Final  . Comment 2 10/23/2012 Documented in Chart   Final  . Glucose-Capillary 10/23/2012 127* 70 - 99 mg/dL Final  . WBC 29/56/2130 11.1* 4.0 - 10.5 K/uL Final  . RBC 10/24/2012 4.29  4.22 - 5.81 MIL/uL Final  . Hemoglobin 10/24/2012 12.5* 13.0 - 17.0 g/dL Final  . HCT 86/57/8469 37.2* 39.0 - 52.0 % Final  . MCV 10/24/2012 86.7  78.0 - 100.0 fL Final  . MCH 10/24/2012 29.1  26.0 - 34.0 pg Final  . MCHC 10/24/2012 33.6  30.0 - 36.0 g/dL Final  . RDW 62/95/2841 13.1  11.5 - 15.5 % Final  . Platelets 10/24/2012 213  150 - 400 K/uL Final  . Glucose-Capillary 10/23/2012 155* 70 - 99 mg/dL Final  . Glucose-Capillary 10/23/2012 117* 70 - 99 mg/dL  Final  . Comment 1 32/44/0102 Notify RN   Final  . Glucose-Capillary 10/24/2012 133* 70 - 99 mg/dL Final  Hospital Outpatient Visit on 10/16/2012  Component Date Value Range Status  . Sodium 10/16/2012 139  135 - 145 mEq/L Final  . Potassium 10/16/2012 3.7  3.5 - 5.1 mEq/L Final  . Chloride 10/16/2012 104  96 - 112 mEq/L Final  . CO2 10/16/2012 27  19 - 32 mEq/L Final  . Glucose, Bld 10/16/2012 122* 70 - 99 mg/dL Final  . BUN 72/53/6644 17  6 - 23 mg/dL Final  . Creatinine, Ser 10/16/2012 0.77  0.50 - 1.35 mg/dL Final  . Calcium 03/47/4259 9.5  8.4 - 10.5 mg/dL Final  . GFR calc non Af Amer 10/16/2012 >90  >90 mL/min Final  . GFR calc Af Amer 10/16/2012 >90  >90 mL/min Final   Comment: (NOTE)                          The eGFR has been calculated using the CKD EPI equation.                          This calculation has not been validated in all clinical situations.                          eGFR's persistently <90 mL/min signify possible Chronic Kidney                          Disease.  . WBC 10/16/2012 8.6  4.0 - 10.5 K/uL Final  . RBC 10/16/2012 4.77  4.22 - 5.81 MIL/uL Final  . Hemoglobin 10/16/2012 14.3  13.0 - 17.0 g/dL Final  . HCT 56/38/7564 40.8  39.0 - 52.0 % Final  . MCV 10/16/2012 85.5  78.0 - 100.0 fL Final  . MCH 10/16/2012 30.0  26.0 - 34.0 pg Final  . MCHC 10/16/2012 35.0  30.0 -  36.0 g/dL Final  . RDW 16/11/9602 12.8  11.5 - 15.5 % Final  . Platelets 10/16/2012 249  150 - 400 K/uL Final  . Prothrombin Time 10/16/2012 13.2  11.6 - 15.2 seconds Final  . INR 10/16/2012 1.02  0.00 - 1.49 Final  . Color, Urine 10/16/2012 YELLOW  YELLOW Final  . APPearance 10/16/2012 CLEAR  CLEAR Final  . Specific Gravity, Urine 10/16/2012 1.025  1.005 - 1.030 Final  . pH 10/16/2012 5.0  5.0 - 8.0 Final  . Glucose, UA 10/16/2012 NEGATIVE  NEGATIVE mg/dL Final  . Hgb urine dipstick 10/16/2012 NEGATIVE  NEGATIVE Final  . Bilirubin Urine 10/16/2012 NEGATIVE  NEGATIVE Final  . Ketones, ur  10/16/2012 NEGATIVE  NEGATIVE mg/dL Final  . Protein, ur 54/10/8117 NEGATIVE  NEGATIVE mg/dL Final  . Urobilinogen, UA 10/16/2012 0.2  0.0 - 1.0 mg/dL Final  . Nitrite 14/78/2956 NEGATIVE  NEGATIVE Final  . Leukocytes, UA 10/16/2012 NEGATIVE  NEGATIVE Final   MICROSCOPIC NOT DONE ON URINES WITH NEGATIVE PROTEIN, BLOOD, LEUKOCYTES, NITRITE, OR GLUCOSE <1000 mg/dL.     X-Rays:Dg Chest 2 View  10/16/2012   *RADIOLOGY REPORT*  Clinical Data: Hypertension  CHEST - 2 VIEW  Comparison: October 08, 2011.  Findings: Cardiomediastinal silhouette appears normal.  No acute pulmonary disease is noted.  Bony thorax is intact.  IMPRESSION: No acute cardiopulmonary abnormality noted.   Original Report Authenticated By: Lupita Raider.,  M.D.   Dg Knee 1-2 Views Left  10/16/2012   CLINICAL DATA:  62 year old male preoperative study for left knee arthroplasty.  EXAM: LEFT KNEE - 1-2 VIEW  COMPARISON:  MRI 11/18/2009.  FINDINGS: Degenerative spurring maximal at the lateral compartment. Patella intact. No joint effusion. Bone mineralization is within normal limits. No fracture or dislocation.  IMPRESSION: Lateral compartment degenerative spurring. No acute osseous abnormality identified.   Electronically Signed   By: Augusto Gamble   On: 10/16/2012 14:44   Dg Knee Left Port  10/22/2012   *RADIOLOGY REPORT*  Clinical Data: Post left total knee replacement  PORTABLE LEFT KNEE - 1-2 VIEW  Comparison: 10/16/2012  Findings: Two views of the left knee submitted.  There is a left knee prosthesis in anatomic alignment.  Postsurgical changes are noted with midline skin staples.  Small amount of periarticular soft tissue air.  Surgical drain in place.  IMPRESSION: Left knee prosthesis in anatomic alignment.  Postsurgical changes are noted.   Original Report Authenticated By: Natasha Mead, M.D.    EKG: Orders placed in visit on 08/06/12  . EKG 12-LEAD     Hospital Course: Dylan [name] is a 62 y.o. who was admitted to [facility]. They were brought to the operating room on 10/22/2012 and underwent Procedure(s): TOTAL KNEE ARTHROPLASTY.  Patient tolerated the procedure well and was later transferred to the recovery room and then to the orthopaedic floor for postoperative care.  They were given PO and IV analgesics for pain control following their surgery.  They were given 24 hours of postoperative antibiotics of  Anti-infectives   Start     Dose/Rate Route Frequency Ordered Stop   10/22/12 1400  ceFAZolin (ANCEF) IVPB 2 g/50 mL premix     2 g 100 mL/hr over 30 Minutes Intravenous 3 times per day 10/22/12 1220 10/22/12 2211   10/22/12 1300  clindamycin (CLEOCIN) IVPB 900 mg     900 mg 100 mL/hr over 30 Minutes Intravenous Every 6 hours 10/22/12 1220 10/22/12 1940   10/22/12 0818  polymyxin B 500,000 Units, bacitracin 50,000 Units in sodium chloride irrigation 0.9 % 500 mL irrigation  Status:  Discontinued       As needed 10/22/12 0818 10/22/12 1004   10/22/12 0615  ceFAZolin (ANCEF) IVPB 2 g/50 mL premix     2 g 100 mL/hr over 30 Minutes Intravenous On call to O.R. 10/22/12 0604 10/22/12 0740   10/22/12 0615  clindamycin (CLEOCIN) IVPB 900 mg     900 mg 100 mL/hr over 30 Minutes Intravenous On call to O.R. 10/22/12 0865 10/22/12 0754     and started on DVT prophylaxis in the form of Xarelto, TED hose and SCDs.   PT and OT were ordered for total joint protocol.  Discharge planning consulted to help with postop disposition and equipment needs.  Patient had a good night on the evening of surgery.  They started to get up OOB with therapy on day one. Hemovac drain was pulled without difficulty.  Continued to work with therapy into day two. By day two, the patient had progressed with therapy and meeting their goals.  Incision was healing well.  Patient was seen in rounds and was ready to go home.   Discharge Medications: Prior to Admission medications   Medication Sig Start Date End Date Taking? Authorizing Provider    amLODipine (NORVASC) 5 MG tablet Take 5 mg by mouth daily.   Yes Historical Provider, MD  metFORMIN (GLUCOPHAGE) 500 MG tablet Take 500 mg by mouth 2 (two) times daily with a meal.   Yes Historical Provider, MD  Multiple Vitamins-Minerals (MULTI-VITAMIN GUMMIES PO) Take 1 tablet by mouth daily.   Yes Historical Provider, MD  mupirocin ointment (BACTROBAN) 2 % Apply topically 2 (two) times daily. X 7 days before surgery per dr Shelle Iron   Yes Historical Provider, MD  Omega-3 Fatty Acids (FISH OIL) 1000 MG CAPS Take 2 capsules by mouth daily.   Yes Historical Provider, MD  Pitavastatin Calcium (LIVALO) 4 MG TABS Take 4 mg by mouth daily.   Yes Historical Provider, MD  valsartan (DIOVAN) 160 MG tablet Take 160 mg by mouth every morning.   Yes Historical Provider, MD  zolpidem (AMBIEN) 10 MG tablet Take 10 mg by mouth at bedtime as needed for sleep.   Yes Historical Provider, MD  aspirin EC 81 MG tablet Take 1 tablet (81 mg total) by mouth daily. 10/24/12   Genelle Gather Babish, [name]  docusate sodium 100 MG CAPS Take 100 mg by mouth 2 (two) times daily. 10/24/12   Genelle Gather Babish, [name]  methocarbamol (ROBAXIN) 500 MG tablet Take 1 tablet (500 mg total) by mouth 4 (four) times daily as needed. 10/22/12   Dayna Barker. Bissell, [name]  oxyCODONE-acetaminophen (ROXICET) 5-325 MG per tablet Take 1-2 tablets by mouth every 4 (four) hours as needed for pain. 10/22/12   Dayna Barker. Bissell, [name]  rivaroxaban (XARELTO) 10 MG TABS tablet Take 1 tablet (10 mg total) by mouth daily. 10/22/12   Dayna Barker. Christene Lye, [name]    Diet: Diabetic diet Activity:WBAT Follow-up:in 10-14 days Disposition - Home Discharged Condition: good   Discharge Orders   Future Orders Complete By Expires   Call MD / Call 911  As directed    Comments:     If you experience chest pain or shortness of breath, CALL 911 and be transported to the hospital emergency room.  If you develope a fever above 101 F, pus (white drainage) or increased  drainage or redness at the wound, or calf  pain, call your surgeon's office.   Change dressing  As directed    Comments:     Maintain surgical dressing for 10-14 days, then change the dressing daily with sterile 4 x 4 inch gauze dressing and tape. Keep the area dry and clean.   Constipation Prevention  As directed    Comments:     Drink plenty of fluids.  Prune juice may be helpful.  You may use a stool softener, such as Colace (over the counter) 100 mg twice a day.  Use MiraLax (over the counter) for constipation as needed.   Diet - low sodium heart healthy  As directed    Discharge instructions  As directed    Comments:     Maintain surgical dressing for 10-14 days, then replace with gauze and tape. Keep the area dry and clean until follow up. Follow up in 2 weeks at [facility]. Call with any questions or concerns.   Increase activity slowly as tolerated  As directed    TED hose  As directed    Comments:     Use stockings (TED hose) for 2 weeks on both leg(s).  You may remove them at night for sleeping.   Weight bearing as tolerated  As directed        Medication List    STOP taking these medications       HIBICLENS 4 % external liquid  Generic drug:  chlorhexidine     HYDROcodone-acetaminophen 7.5-325 MG per tablet  Commonly known as:  NORCO     ibuprofen 200 MG tablet  Commonly known as:  ADVIL,MOTRIN      TAKE these medications       amLODipine 5 MG tablet  Commonly known as:  NORVASC  Take 5 mg by mouth daily.     aspirin EC 81 MG tablet  Take 1 tablet (81 mg total) by mouth daily.     DSS 100 MG Caps  Take 100 mg by mouth 2 (two) times daily.     Fish Oil 1000 MG Caps  Take 2 capsules by mouth daily.     LIVALO 4 MG Tabs  Generic drug:  Pitavastatin Calcium  Take 4 mg by mouth daily.     metFORMIN 500 MG tablet  Commonly known as:  GLUCOPHAGE  Take 500 mg by mouth 2 (two) times daily with a meal.     methocarbamol 500 MG tablet  Commonly  known as:  ROBAXIN  Take 1 tablet (500 mg total) by mouth 4 (four) times daily as needed.     MULTI-VITAMIN GUMMIES PO  Take 1 tablet by mouth daily.     mupirocin ointment 2 %  Commonly known as:  BACTROBAN  Apply topically 2 (two) times daily. X 7 days before surgery per dr Shelle Iron     oxyCODONE-acetaminophen 5-325 MG per tablet  Commonly known as:  ROXICET  Take 1-2 tablets by mouth every 4 (four) hours as needed for pain.     rivaroxaban 10 MG Tabs tablet  Commonly known as:  XARELTO  Take 1 tablet (10 mg total) by mouth daily.     valsartan 160 MG tablet  Commonly known as:  DIOVAN  Take 160 mg by mouth every morning.     zolpidem 10 MG tablet  Commonly known as:  AMBIEN  Take 10 mg by mouth at bedtime as needed for sleep.           Follow-up Information   Follow up  with BEANE,JEFFREY C, MD In 2 weeks.   Specialty:  Orthopedic Surgery   Contact information:   [address] [place] [address] [phone]       Signed: Dorothy Spark. 10/24/2012, 1:58 PM

## 2012-10-24 NOTE — Progress Notes (Signed)
Discharged from floor via w/c, spouse with pt. No changes in assessment. Dylan [name]   

## 2012-10-24 NOTE — Progress Notes (Signed)
Dylan [name]  MRN: [id number] DOB/Age: 10-13-1950 62 y.o. Physician: Dylan [name], M.D. 2 Days Post-Op Procedure(s) (LRB): TOTAL KNEE ARTHROPLASTY (Left)  Subjective: Slept well, reports minimal pain Vital Signs Temp:  [98.3 F (36.8 C)-98.7 F (37.1 C)] 98.3 F (36.8 C) (08/30 0452) Pulse Rate:  [98-103] 102 (08/30 0452) Resp:  [16] 16 (08/30 0452) BP: (136-142)/(67-81) 142/81 mmHg (08/30 0452) SpO2:  [95 %-96 %] 96 % (08/30 0452)  Lab Results  Recent Labs  10/23/12 0415 10/24/12 0417  WBC 11.5* 11.1*  HGB 12.6* 12.5*  HCT 36.6* 37.2*  PLT 216 213   BMET  Recent Labs  10/23/12 0415  NA 133*  K 3.5  CL 98  CO2 29  GLUCOSE 136*  BUN 13  CREATININE 0.79  CALCIUM 8.7   INR  Date Value Range Status  10/16/2012 1.02  0.00 - 1.49 Final     Exam  Dressing with scant blood along incision, aquacell intact, moderate swelling, N/V intact.  Plan D/C home today with HHPT  Dylan [name] M 10/24/2012, 9:23 AM

## 2012-10-24 NOTE — Progress Notes (Signed)
Physical Therapy Treatment Patient Details Name: ISSIAH HUFFAKER MRN: [id number] DOB: [birth year] Today's Date: 10/24/2012 Time: 7829-5621 PT Time Calculation (min): 30 min  PT Assessment / Plan / Recommendation  History of Present Illness     PT Comments   POD # 2 pm session.  Amb pt in hallway and practiced negotiating stairs with spouse present for education.  Pt has met maximal level of mobility and plans to D/C to home today.    Follow Up Recommendations  Home health PT     Does the patient have the potential to tolerate intense rehabilitation     Barriers to Discharge        Equipment Recommendations  None recommended by PT    Recommendations for Other Services    Frequency 7X/week   Progress towards PT Goals Progress towards PT goals: Progressing toward goals  Plan Current plan remains appropriate    Precautions / Restrictions Precautions Precautions: Knee Precaution Comments: instructed pt on KI use for amb  Required Braces or Orthoses: Knee Immobilizer - Left Restrictions Weight Bearing Restrictions: No Other Position/Activity Restrictions: WBAT LLE    Pertinent Vitals/Pain C/o 5/10 ICE applied    Mobility  Bed Mobility Bed Mobility: Sit to Supine Supine to Sit: 5: Supervision Details for Bed Mobility Assistance: pt used a belt to assist R LE off bed with increased time Transfers Transfers: Sit to Stand;Stand to Sit Sit to Stand: 5: Supervision;4: Min guard;From bed;From toilet;From chair/3-in-1 Stand to Sit: 5: Supervision;4: Min guard;To chair/3-in-1;To toilet Details for Transfer Assistance: <25% VC's on proper advancement of L LE prior to sit and stand to decrease pain Ambulation/Gait Ambulation/Gait Assistance: 4: Min guard;5: Supervision Ambulation Distance (Feet): 94 Feet Assistive device: Rolling walker Ambulation/Gait Assistance Details: 25% VC's on proper L LE placemement esp to avoid stepping further thatn front of walker.  Gait Pattern:  Step-to pattern;Decreased step length - right;Decreased step length - left;Trunk flexed;Antalgic Gait velocity: decreased Stairs: Yes Stairs Assistance: 4: Min assist;4: Min guard Stairs Assistance Details (indicate cue type and reason): with spouse present and 25% VC's on proper sequencing and safety using one rail and one crutch. Stair Management Technique: One rail Left;With crutches;Forwards Number of Stairs: 4     PT Goals (current goals can now be found in the care plan section)    Visit Information  Last PT Received On: 10/24/12 Assistance Needed: +1    Subjective Data      Cognition       Balance     End of Session PT - End of Session Equipment Utilized During Treatment: Left knee immobilizer;Gait belt Activity Tolerance: Patient tolerated treatment well Patient left: in chair;with call bell/phone within reach;with family/visitor present Nurse Communication: Mobility status   Felecia Shelling  PTA [facility]  Acute  Rehab Pager      [phone]

## 2012-10-24 NOTE — Progress Notes (Signed)
Physical Therapy Treatment Patient Details Name: Dylan [name] MRN: [id number] DOB: Dec 15, 1950 Today's Date: 10/24/2012 Time: 0840-0905 PT Time Calculation (min): 25 min  PT Assessment / Plan / Recommendation  History of Present Illness     PT Comments   POD # 2 am session.  Instructed pt on KI use for amb.  Assisted OOB to amb in hallway.  Performed TKR TE's following HEP handout. Pt plans to D/C to home   Follow Up Recommendations  Home health PT     Does the patient have the potential to tolerate intense rehabilitation     Barriers to Discharge        Equipment Recommendations  None recommended by PT    Recommendations for Other Services    Frequency     Progress towards PT Goals Progress towards PT goals: Progressing toward goals  Plan Current plan remains appropriate    Precautions / Restrictions Precautions Precautions: Knee Precaution Comments: instructed pt on KI use for amb  Required Braces or Orthoses: Knee Immobilizer - Left Restrictions Weight Bearing Restrictions: No Other Position/Activity Restrictions: WBAT LLE    Pertinent Vitals/Pain C/o 7/10 after session RN notified ICE applied    Mobility  Bed Mobility Bed Mobility: Sit to Supine Supine to Sit: 4: Min assist Details for Bed Mobility Assistance: min assist for L LE and increased time Transfers Transfers: Sit to Stand;Stand to Sit Sit to Stand: 4: Min guard;From bed;With upper extremity assist Stand to Sit: 4: Min guard;With upper extremity assist;To bed Details for Transfer Assistance: cues for UE and LE placement Ambulation/Gait Ambulation/Gait Assistance: 4: Min guard Ambulation Distance (Feet): 85 Feet Assistive device: Rolling walker Ambulation/Gait Assistance Details: 25% VC's on proper sequencing and proper walker to self distance  Gait Pattern: Step-to pattern;Decreased step length - right;Decreased step length - left;Trunk flexed;Antalgic Gait velocity: decreased    Exercises    Total Knee Replacement TE's 10 reps B LE ankle pumps 10 reps knee presses 10 reps heel slides  10 reps SAQ's 10 reps SLR's 10 reps ABD Followed by ICE    PT Goals (current goals can now be found in the care plan section)    Visit Information  Last PT Received On: 10/24/12 Assistance Needed: +1    Subjective Data      Cognition       Balance     End of Session PT - End of Session Equipment Utilized During Treatment: Left knee immobilizer;Gait belt Activity Tolerance: Patient tolerated treatment well Patient left: in chair;with call bell/phone within reach;with family/visitor present   Felecia Shelling  PTA [facility]  Acute  Rehab Pager      [phone]

## 2012-10-28 LAB — GLUCOSE, CAPILLARY: Glucose-Capillary: 122 mg/dL — ABNORMAL HIGH (ref 70–99)

## 2013-01-22 ENCOUNTER — Other Ambulatory Visit: Payer: Self-pay | Admitting: Internal Medicine

## 2013-02-19 ENCOUNTER — Other Ambulatory Visit: Payer: Self-pay | Admitting: Internal Medicine

## 2013-02-20 ENCOUNTER — Other Ambulatory Visit: Payer: Self-pay | Admitting: Internal Medicine

## 2013-03-04 ENCOUNTER — Other Ambulatory Visit: Payer: Self-pay | Admitting: Internal Medicine

## 2013-03-05 ENCOUNTER — Other Ambulatory Visit: Payer: Self-pay | Admitting: *Deleted

## 2013-03-05 MED ORDER — VALSARTAN 160 MG PO TABS: 160.0000 mg | ORAL_TABLET | Freq: Every morning | ORAL | Status: DC

## 2013-03-05 MED ORDER — PITAVASTATIN CALCIUM 4 MG PO TABS: 4.0000 mg | ORAL_TABLET | Freq: Every day | ORAL | Status: DC

## 2013-04-04 ENCOUNTER — Other Ambulatory Visit: Payer: Self-pay | Admitting: Internal Medicine

## 2013-04-14 ENCOUNTER — Encounter: Payer: Self-pay | Admitting: Internal Medicine

## 2013-04-14 ENCOUNTER — Ambulatory Visit (INDEPENDENT_AMBULATORY_CARE_PROVIDER_SITE_OTHER): Payer: Managed Care, Other (non HMO) | Admitting: Internal Medicine

## 2013-04-14 VITALS — BP 124/84 | HR 76 | Temp 98.0°F | Ht 72.0 in | Wt 230.0 lb

## 2013-04-14 DIAGNOSIS — L03019 Cellulitis of unspecified finger: Secondary | ICD-10-CM

## 2013-04-14 DIAGNOSIS — M1712 Unilateral primary osteoarthritis, left knee: Secondary | ICD-10-CM

## 2013-04-14 DIAGNOSIS — M1710 Unilateral primary osteoarthritis, unspecified knee: Secondary | ICD-10-CM

## 2013-04-14 DIAGNOSIS — IMO0002 Reserved for concepts with insufficient information to code with codable children: Secondary | ICD-10-CM

## 2013-04-14 DIAGNOSIS — L259 Unspecified contact dermatitis, unspecified cause: Secondary | ICD-10-CM

## 2013-04-14 DIAGNOSIS — L03012 Cellulitis of left finger: Secondary | ICD-10-CM

## 2013-04-14 DIAGNOSIS — F4321 Adjustment disorder with depressed mood: Secondary | ICD-10-CM

## 2013-04-14 DIAGNOSIS — L309 Dermatitis, unspecified: Secondary | ICD-10-CM | POA: Insufficient documentation

## 2013-04-14 MED ORDER — SOLIFENACIN SUCCINATE 5 MG PO TABS: 5.0000 mg | ORAL_TABLET | Freq: Every day | ORAL | Status: DC

## 2013-04-14 MED ORDER — CEPHALEXIN 500 MG PO CAPS: 500.0000 mg | ORAL_CAPSULE | Freq: Three times a day (TID) | ORAL | Status: DC

## 2013-04-14 MED ORDER — CLOBETASOL PROPIONATE 0.05 % EX OINT: 1.0000 "application " | TOPICAL_OINTMENT | Freq: Two times a day (BID) | CUTANEOUS | Status: DC

## 2013-04-14 MED ORDER — ALPRAZOLAM 0.25 MG PO TABS: 0.2500 mg | ORAL_TABLET | Freq: Two times a day (BID) | ORAL | Status: DC | PRN

## 2013-04-14 MED ORDER — CITALOPRAM HYDROBROMIDE 10 MG PO TABS: 10.0000 mg | ORAL_TABLET | Freq: Every day | ORAL | Status: DC

## 2013-04-14 NOTE — Progress Notes (Signed)
Pre visit review using our clinic review tool, if applicable. No additional management support is needed unless otherwise documented below in the visit note. 

## 2013-04-14 NOTE — Assessment & Plan Note (Signed)
S/P left knee replacement.  No complication.  Doing well.

## 2013-04-14 NOTE — Patient Instructions (Signed)
Please complete the following lab tests before your next follow up appointment: BMET - 401.9 FLP, LFTs - 272.4 A1c - 790.29 

## 2013-04-14 NOTE — Assessment & Plan Note (Signed)
Use clobetasol ointment as directed

## 2013-04-14 NOTE — Progress Notes (Signed)
Subjective:    Patient ID: Dylan [name], male    DOB: 08/11/1950, 63 y.o.   MRN: [id number]  HPI  63 year old white male with history of hyperlipidemia, abnormal glucose and hypertension presents with multiple complaints. Patient reports over the last several days he has developed redness and tenderness tip of left middle finger. No purulent drainage. He denies fever or chills.  Patient's wife has advanced breast cancer. She is undergoing palliative treatments. Patient reports feeling stressed and anxious. He is accompanied by supportive daughter Dylan [name].  He has history of overactive bladder. He has tried VESIcare in the past and would like to restart medication.  He also has history of hand eczema. He has not been using his steroid ointments as directed. His symptoms worse during the winter months.  Interval medical history-patient successfully underwent knee replacement without complications. He has lost weight and is feeling much better overall.  Review of Systems Negative for chest pain, negative for fever.    Allergies reviewed. He gets rash with use of penicillin. No history of anaphylaxis.    Past Medical History  Diagnosis Date  . Allergy     allergic rhinitis  . Hyperlipidemia   . History of ulcerative colitis     non recent flares  . History of melanoma 15 yrs ago    face and forehead-- previously followed by Derm in [place]  . Diabetes mellitus   . Hypertension   . Sleep apnea     last sleep study 10 yrs ago,CPAP  . Complication of anesthesia     trouble waking up with succycholine, does not have enzyme to break down succycholine  . Hepatitis age 18    history of infections Hepatitis type a  . Headache(784.0)   . Cancer     melonoma    History   Social History  . Marital Status: Married    Spouse Name: N/A    Number of Children: N/A  . Years of Education: N/A   Occupational History  . Not on file.   Social History Main Topics  . Smoking status:  Never Smoker   . Smokeless tobacco: Former Systems developer    Quit date: 02/26/2003  . Alcohol Use: No  . Drug Use: No  . Sexual Activity: Not on file   Other Topics Concern  . Not on file   Social History Narrative   Wife works at cath lab at [employer] advanced breast cancer    Past Surgical History  Procedure Laterality Date  . Tonsillectomy  1969  . Knee surgery  yrs ago    right knee  . Anterior cervical decomp/discectomy fusion  10/15/2011    Procedure: ANTERIOR CERVICAL DECOMPRESSION/DISCECTOMY FUSION 1 LEVEL;  Surgeon: [name], MD;  Location: [facility];  Service: Neurosurgery;  Laterality: N/A;  Cervical four-five Anterior cervical decompression/diskectomy/fusion  . Acl and mensicus repair reconstruct Right 1980's  . Knee arthroscopy Left last done oct 2013    x 3  . Appendectomy  1969  . 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 Cancer Brother     brain tumor  . Diabetes Other   . Cancer Other     ovarian    Allergies  Allergen Reactions  . Vancomycin Hives    Ran off a pump. So developed  redman's . Running too fast  . Heparin     REACTION: breathing problems  . Penicillins     REACTION: Rash  . Succinylcholine Chloride     REACTION: does not wake up from anesthetic    Current Outpatient Prescriptions on File Prior to Visit  Medication Sig Dispense Refill  . amLODipine (NORVASC) 5 MG tablet TAKE 1 TABLET BY MOUTH DAILY  90 tablet  1  . aspirin EC 81 MG tablet Take 1 tablet (81 mg total) by mouth daily.      Marland Kitchen docusate sodium 100 MG CAPS Take 100 mg by mouth 2 (two) times daily.  10 capsule  0  . LIVALO 4 MG TABS TAKE 1 TABLET BY MOUTH DAILY  90 tablet  1  . metFORMIN (GLUCOPHAGE) 500 MG tablet TAKE 1 TABLET BY MOUTH TWICE A DAY WITH A MEAL  180 tablet  1  . Multiple  Vitamins-Minerals (MULTI-VITAMIN GUMMIES PO) Take 1 tablet by mouth daily.      . Omega-3 Fatty Acids (FISH OIL) 1000 MG CAPS Take 2 capsules by mouth daily.      Marland Kitchen oxyCODONE-acetaminophen (ROXICET) 5-325 MG per tablet Take 1-2 tablets by mouth every 4 (four) hours as needed for pain.  50 tablet  0  . valsartan (DIOVAN) 160 MG tablet TAKE 1 TABLET BY MOUTH DAILY  90 tablet  1  . zolpidem (AMBIEN) 10 MG tablet TAKE 1 TABLET BY MOUTH AT BEDTIME AS NEEDED  30 tablet  3   No current facility-administered medications on file prior to visit.    BP 124/84  Pulse 76  Temp(Src) 98 F (36.7 C) (Oral)  Ht 6' (1.829 m)  Wt 230 lb (104.327 kg)  BMI 31.19 kg/m2    Objective:   Physical Exam  Constitutional: He is oriented to person, place, and time. He appears well-developed and well-nourished. No distress.  HENT:  Head: Normocephalic and atraumatic.  Neck: Neck supple.  No carotid bruit  Cardiovascular: Normal rate, regular rhythm and normal heart sounds.   No murmur heard. Pulmonary/Chest: Effort normal and breath sounds normal. He has no wheezes.  Lymphadenopathy:    He has no cervical adenopathy.  Neurological: He is alert and oriented to person, place, and time. No cranial nerve deficit.  Skin:  Slight redness and edema around nail bed of left middle finger.  Mild tenderness.  Scattered dry lesions of hands bilaterally  Psychiatric: He has a normal mood and affect. His behavior is normal.          Assessment & Plan:

## 2013-04-14 NOTE — Assessment & Plan Note (Signed)
I suggest patient use combination of citalopram 10 mg once daily and alprazolam 0.25 mg twice a day as needed. Reassess in 2 months.

## 2013-04-14 NOTE — Assessment & Plan Note (Signed)
63 year old white male presents with mild paronychia of middle finger of left hand. Treat with Keflex 500 mg capsules 2 caps twice daily for 7 days. Also soak hand and warm Epsom salt solution for 3-5 days. Patient advised to call office if symptoms persist or worsen.

## 2013-04-21 ENCOUNTER — Ambulatory Visit: Payer: Self-pay | Admitting: Podiatrist

## 2013-05-07 ENCOUNTER — Ambulatory Visit: Payer: Self-pay | Admitting: Podiatrist

## 2013-05-17 ENCOUNTER — Encounter: Payer: Self-pay | Admitting: Internal Medicine

## 2013-05-17 ENCOUNTER — Telehealth: Payer: Self-pay

## 2013-05-17 MED ORDER — ZOLPIDEM TARTRATE 10 MG PO TABS: ORAL_TABLET | ORAL | Status: DC

## 2013-05-17 NOTE — Telephone Encounter (Signed)
Ok to refill x 5 

## 2013-05-17 NOTE — Telephone Encounter (Signed)
Pt req rx on zolpidem (AMBIEN) 10 MG tablet   [facility]

## 2013-05-17 NOTE — Telephone Encounter (Signed)
rx called in

## 2013-05-26 ENCOUNTER — Encounter: Payer: Self-pay | Admitting: Internal Medicine

## 2013-05-26 ENCOUNTER — Encounter: Payer: Managed Care, Other (non HMO) | Admitting: Family Medicine

## 2013-05-26 ENCOUNTER — Telehealth: Payer: Self-pay | Admitting: Internal Medicine

## 2013-05-26 NOTE — Telephone Encounter (Signed)
Pt would like to let you know that his orthopedic md called in an  antibiotic for pt and wlll see him tomorrow. Canceled appt. No picture for yuo

## 2013-05-26 NOTE — Progress Notes (Signed)
Error   This encounter was created in error - please disregard. 

## 2013-06-04 ENCOUNTER — Other Ambulatory Visit: Payer: Self-pay | Admitting: Internal Medicine

## 2013-06-11 ENCOUNTER — Other Ambulatory Visit (HOSPITAL_COMMUNITY): Payer: Self-pay | Admitting: Specialist

## 2013-06-11 DIAGNOSIS — Z96659 Presence of unspecified artificial knee joint: Secondary | ICD-10-CM

## 2013-06-17 ENCOUNTER — Ambulatory Visit (HOSPITAL_COMMUNITY)
Admission: RE | Admit: 2013-06-17 | Discharge: 2013-06-17 | Disposition: A | Discharge status: Home / Self Care | Payer: Managed Care, Other (non HMO) | Source: Ambulatory Visit | Attending: Specialist | Admitting: Specialist

## 2013-06-17 ENCOUNTER — Encounter (HOSPITAL_COMMUNITY): Payer: Self-pay

## 2013-06-17 ENCOUNTER — Encounter (HOSPITAL_COMMUNITY)
Admission: RE | Admit: 2013-06-17 | Discharge: 2013-06-17 | Disposition: A | Discharge status: Home / Self Care | Payer: Managed Care, Other (non HMO) | Source: Ambulatory Visit | Attending: Specialist | Admitting: Specialist

## 2013-06-17 DIAGNOSIS — M25469 Effusion, unspecified knee: Secondary | ICD-10-CM | POA: Insufficient documentation

## 2013-06-17 DIAGNOSIS — Z9181 History of falling: Secondary | ICD-10-CM | POA: Insufficient documentation

## 2013-06-17 DIAGNOSIS — M25569 Pain in unspecified knee: Secondary | ICD-10-CM | POA: Insufficient documentation

## 2013-06-17 DIAGNOSIS — Z96659 Presence of unspecified artificial knee joint: Secondary | ICD-10-CM | POA: Insufficient documentation

## 2013-06-17 MED ORDER — TECHNETIUM TC 99M MEDRONATE IV KIT
25.0000 | PACK | Freq: Once | INTRAVENOUS | Status: AC | PRN
  Administered 2013-06-17: 26.9 via INTRAVENOUS

## 2013-08-09 ENCOUNTER — Ambulatory Visit (INDEPENDENT_AMBULATORY_CARE_PROVIDER_SITE_OTHER): Payer: Managed Care, Other (non HMO) | Admitting: Internal Medicine

## 2013-08-09 ENCOUNTER — Encounter: Payer: Self-pay | Admitting: Internal Medicine

## 2013-08-09 VITALS — BP 126/80 | HR 84 | Temp 98.3°F | Ht 72.0 in | Wt 225.0 lb

## 2013-08-09 DIAGNOSIS — F4321 Adjustment disorder with depressed mood: Secondary | ICD-10-CM

## 2013-08-09 DIAGNOSIS — M25562 Pain in left knee: Secondary | ICD-10-CM

## 2013-08-09 DIAGNOSIS — IMO0002 Reserved for concepts with insufficient information to code with codable children: Secondary | ICD-10-CM

## 2013-08-09 DIAGNOSIS — M1712 Unilateral primary osteoarthritis, left knee: Secondary | ICD-10-CM

## 2013-08-09 DIAGNOSIS — M25569 Pain in unspecified knee: Secondary | ICD-10-CM

## 2013-08-09 DIAGNOSIS — M1710 Unilateral primary osteoarthritis, unspecified knee: Secondary | ICD-10-CM

## 2013-08-09 MED ORDER — CITALOPRAM HYDROBROMIDE 10 MG PO TABS: 10.0000 mg | ORAL_TABLET | Freq: Every day | ORAL | Status: DC

## 2013-08-09 NOTE — Progress Notes (Signed)
Subjective:    Patient ID: Dylan [name], male    DOB: 1950-08-29, 63 y.o.   MRN: [id number]  HPI  63 year old white male with history of borderline type 2 diabetes, hypertension and adjustment disorder with anxiety for followup. At previous visit he was started on citalopram 10 mg once daily. He also used alprazolam 0.25 mg once a day for severe symptoms. Patient reports that within 2-4 weeks of starting citalopram his anxiety symptoms significantly improved.  He only used 3-4 doses of alprazolam.  Patient also feels he is much more able to handle stress of his wife's breast cancer.  Interval medical history-patient has been experiencing ongoing issues with left medial knee pain. He had total knee replacement 9 months ago. He reports he did not have significant symptoms at first but as he continued with physical therapy / rehab and has joint "loosened up", he started experiencing severe intermittent medial deep joint pain.  He has followed up with his orthopedic surgeon and followup knee x-rays have been unrevealing. He underwent triple phase bone scan of lower extremity on 06/18/2011.   It showed - Increased activity surrounding the left total knee arthroplasty in all 3 phases. Findings are nonspecific and could be secondary to prosthetic loosening or distal femur injury.  Patient reports she was prescribed Lyrica and care and 10. There has been no improvement in his symptoms. He requests referral to another orthopedic group for second opinion.  Review of Systems Negative for weight gain,  Negative for left knee tenderness, redness or swelling    Past Medical History  Diagnosis Date  . Allergy     allergic rhinitis  . Hyperlipidemia   . History of ulcerative colitis     non recent flares  . History of melanoma 15 yrs ago    face and forehead-- previously followed by Derm in [place]  . Diabetes mellitus   . Hypertension   . Sleep apnea     last sleep study 10 yrs ago,CPAP  .  Complication of anesthesia     trouble waking up with succycholine, does not have enzyme to break down succycholine  . Hepatitis age 54    history of infections Hepatitis type a  . Headache(784.0)   . Cancer     melonoma    History   Social History  . Marital Status: Married    Spouse Name: N/A    Number of Children: N/A  . Years of Education: N/A   Occupational History  . Not on file.   Social History Main Topics  . Smoking status: Never Smoker   . Smokeless tobacco: Former Systems developer    Quit date: 02/26/2003  . Alcohol Use: No  . Drug Use: No  . Sexual Activity: Not on file   Other Topics Concern  . Not on file   Social History Narrative   Wife works at cath lab at [employer] advanced breast cancer    Past Surgical History  Procedure Laterality Date  . Tonsillectomy  1969  . Knee surgery  yrs ago    right knee  . Anterior cervical decomp/discectomy fusion  10/15/2011    Procedure: ANTERIOR CERVICAL DECOMPRESSION/DISCECTOMY FUSION 1 LEVEL;  Surgeon: [name], MD;  Location: [facility];  Service: Neurosurgery;  Laterality: N/A;  Cervical four-five Anterior cervical decompression/diskectomy/fusion  . Acl and mensicus repair reconstruct Right 1980's  . Knee arthroscopy Left last done oct 2013    x 3  . Appendectomy  1969  . Total  knee arthroplasty Left 10/22/2012    Procedure: TOTAL KNEE ARTHROPLASTY;  Surgeon: [name], MD;  Location: [facility];  Service: Orthopedics;  Laterality: Left;    Family History  Problem Relation Age of Onset  . Cancer Brother     brain tumor  . Diabetes Other   . Cancer Other     ovarian    Allergies  Allergen Reactions  . Vancomycin Hives    Ran off a pump. So developed redman's . Running too fast  . Heparin     REACTION: breathing problems  . Penicillins     REACTION: Rash  . Succinylcholine Chloride     REACTION: does not wake up from anesthetic    Current Outpatient Prescriptions on File Prior to Visit  Medication  Sig Dispense Refill  . amLODipine (NORVASC) 5 MG tablet TAKE 1 TABLET BY MOUTH DAILY  90 tablet  1  . aspirin EC 81 MG tablet Take 1 tablet (81 mg total) by mouth daily.      Marland Kitchen LIVALO 4 MG TABS TAKE 1 TABLET BY MOUTH DAILY  90 tablet  1  . metFORMIN (GLUCOPHAGE) 500 MG tablet TAKE 1 TABLET BY MOUTH TWICE A DAY WITH A MEAL  180 tablet  1  . Multiple Vitamins-Minerals (MULTI-VITAMIN GUMMIES PO) Take 1 tablet by mouth daily.      . Omega-3 Fatty Acids (FISH OIL) 1000 MG CAPS Take 2 capsules by mouth daily.      . valsartan (DIOVAN) 160 MG tablet TAKE 1 TABLET BY MOUTH DAILY  90 tablet  1  . zolpidem (AMBIEN) 10 MG tablet TAKE 1 TABLET BY MOUTH AT BEDTIME AS NEEDED  30 tablet  5   No current facility-administered medications on file prior to visit.    BP 126/80  Pulse 84  Temp(Src) 98.3 F (36.8 C) (Oral)  Ht 6' (1.829 m)  Wt 225 lb (102.059 kg)  BMI 30.51 kg/m2    Objective:   Physical Exam  Constitutional: He appears well-developed and well-nourished.  HENT:  Head: Normocephalic and atraumatic.  Cardiovascular: Normal rate, regular rhythm and normal heart sounds.   No murmur heard. Pulmonary/Chest: Effort normal and breath sounds normal. He has no wheezes.  Musculoskeletal:  Left knee - knee joint is stable.  No redness or swelling.  No medial joint line tenderness  Skin: Skin is warm and dry.  Psychiatric: He has a normal mood and affect. His behavior is normal.          Assessment & Plan:

## 2013-08-09 NOTE — Assessment & Plan Note (Signed)
Excellent response to citalopram 10 mg.  No side effects noted.  He only use alprazolam short term.  Continue same dose.  Reassess in 6 months.

## 2013-08-09 NOTE — Progress Notes (Signed)
Pre visit review using our clinic review tool, if applicable. No additional management support is needed unless otherwise documented below in the visit note. 

## 2013-08-09 NOTE — Assessment & Plan Note (Signed)
Patient had total knee replacement 9 months ago. Initially he did well but now is experiencing unexplained medial joint tenderness. His followup x-rays were unrevealing. His triple phase bone scan are nonspecific. He requests second opinion from another orthopedic group. Referred to Dr. Berenice Primas for second opinion.  We also discussed possible referral to tertiary medical center.

## 2013-08-31 ENCOUNTER — Other Ambulatory Visit: Payer: Self-pay | Admitting: Internal Medicine

## 2013-09-03 ENCOUNTER — Other Ambulatory Visit: Payer: Self-pay | Admitting: Internal Medicine

## 2013-10-31 ENCOUNTER — Other Ambulatory Visit: Payer: Self-pay | Admitting: Internal Medicine

## 2013-11-04 ENCOUNTER — Encounter: Payer: Self-pay | Admitting: Internal Medicine

## 2013-11-05 ENCOUNTER — Encounter: Payer: Self-pay | Admitting: Internal Medicine

## 2013-11-05 ENCOUNTER — Telehealth: Payer: Self-pay | Admitting: *Deleted

## 2013-11-05 DIAGNOSIS — R7309 Other abnormal glucose: Secondary | ICD-10-CM

## 2013-11-05 DIAGNOSIS — D649 Anemia, unspecified: Secondary | ICD-10-CM

## 2013-11-05 DIAGNOSIS — E785 Hyperlipidemia, unspecified: Secondary | ICD-10-CM

## 2013-11-05 MED ORDER — ZOLPIDEM TARTRATE 10 MG PO TABS: ORAL_TABLET | ORAL | Status: DC

## 2013-11-05 NOTE — Telephone Encounter (Signed)
rx called in

## 2013-11-05 NOTE — Telephone Encounter (Signed)
Refill zolpidem 10 mg qhs [facility]

## 2013-11-05 NOTE — Telephone Encounter (Signed)
Ok to refill x 3 

## 2013-11-06 ENCOUNTER — Other Ambulatory Visit: Payer: Self-pay | Admitting: Internal Medicine

## 2013-11-10 ENCOUNTER — Telehealth: Payer: Self-pay | Admitting: Internal Medicine

## 2013-11-10 MED ORDER — MELOXICAM 7.5 MG PO TABS: 7.5000 mg | ORAL_TABLET | Freq: Two times a day (BID) | ORAL | Status: DC | PRN

## 2013-11-10 NOTE — Telephone Encounter (Signed)
[id number] [place],  - [address] sent a PA request for Meloxicam 7.5 mg 60/30.  Pt's plan approves Meloxicam 15 mg 30/30.  Please advise if rx change can be made or if PA needs to be submitted.

## 2013-11-11 MED ORDER — MELOXICAM 15 MG PO TABS: 7.5000 mg | ORAL_TABLET | Freq: Two times a day (BID) | ORAL | Status: DC | PRN

## 2013-11-11 NOTE — Telephone Encounter (Signed)
Ok to change to meloxicam 15 mg.  Take 1/2 bid prn #30.  RF x 1

## 2013-11-11 NOTE — Telephone Encounter (Signed)
rx sent in electronically 

## 2013-12-04 ENCOUNTER — Other Ambulatory Visit: Payer: Self-pay | Admitting: Internal Medicine

## 2013-12-22 ENCOUNTER — Encounter: Payer: Self-pay | Admitting: Internal Medicine

## 2013-12-22 DIAGNOSIS — M25562 Pain in left knee: Secondary | ICD-10-CM

## 2013-12-23 ENCOUNTER — Other Ambulatory Visit: Payer: Self-pay | Admitting: Internal Medicine

## 2013-12-24 ENCOUNTER — Ambulatory Visit (INDEPENDENT_AMBULATORY_CARE_PROVIDER_SITE_OTHER): Payer: Managed Care, Other (non HMO) | Admitting: Internal Medicine

## 2013-12-24 ENCOUNTER — Encounter: Payer: Self-pay | Admitting: Internal Medicine

## 2013-12-24 VITALS — BP 126/80 | Temp 98.4°F | Wt 232.0 lb

## 2013-12-24 DIAGNOSIS — M25562 Pain in left knee: Secondary | ICD-10-CM

## 2013-12-24 MED ORDER — PREGABALIN 50 MG PO CAPS: 50.0000 mg | ORAL_CAPSULE | Freq: Two times a day (BID) | ORAL | Status: DC

## 2013-12-24 NOTE — Progress Notes (Signed)
Subjective:    Patient ID: Dylan [name], male    DOB: [birth year], 63 y.o.   MRN: [id number]  Knee Pain   64 year old white male with history of hypertension, abnormal glucose and chronic left knee pain for follow-up. It has been approximately one year since his left total knee replacement. Patient still having ongoing issues with persistent left knee pain. His pain is worse with weightbearing. He describes sharp pain localized to medial aspect of left knee as well as below left patella. Patient reports significant improvement when his orthopedic physician-Dr. Nelva Bush performed nerve block.  Patient reports improvement was only transient. His symptoms recurred after 2-3 days.  Patient was prescribed nortriptyline 50 mg. He has not started medication.  Anxiety/stress reaction-significant improved since starting citalopram 10 mg.   Review of Systems Negative for fever or chills, negative for left knee redness    Past Medical History  Diagnosis Date  . Allergy     allergic rhinitis  . Hyperlipidemia   . History of ulcerative colitis     non recent flares  . History of melanoma 15 yrs ago    face and forehead-- previously followed by Derm in [place]  . Diabetes mellitus   . Hypertension   . Sleep apnea     last sleep study 10 yrs ago,CPAP  . Complication of anesthesia     trouble waking up with succycholine, does not have enzyme to break down succycholine  . Hepatitis age 11    history of infections Hepatitis type a  . Headache(784.0)   . Cancer     melonoma    History   Social History  . Marital Status: Married    Spouse Name: N/A    Number of Children: N/A  . Years of Education: N/A   Occupational History  . Not on file.   Social History Main Topics  . Smoking status: Never Smoker   . Smokeless tobacco: Former Systems developer    Quit date: 02/26/2003  . Alcohol Use: No  . Drug Use: No  . Sexual Activity: Not on file   Other Topics Concern  . Not on file   Social  History Narrative   Wife works at cath lab at [employer] advanced breast cancer    Past Surgical History  Procedure Laterality Date  . Tonsillectomy  1969  . Knee surgery  yrs ago    right knee  . Anterior cervical decomp/discectomy fusion  10/15/2011    Procedure: ANTERIOR CERVICAL DECOMPRESSION/DISCECTOMY FUSION 1 LEVEL;  Surgeon: [name], MD;  Location: [facility];  Service: Neurosurgery;  Laterality: N/A;  Cervical four-five Anterior cervical decompression/diskectomy/fusion  . Acl and mensicus repair reconstruct Right 1980's  . Knee arthroscopy Left last done oct 2013    x 3  . Appendectomy  1969  . 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 Cancer Brother     brain tumor  . Diabetes Other   . Cancer Other     ovarian    Allergies  Allergen Reactions  . Vancomycin Hives    Ran off a pump. So developed redman's . Running too fast  . Heparin     REACTION: breathing problems  . Penicillins     REACTION: Rash  . Succinylcholine Chloride     REACTION: does  not wake up from anesthetic    Current Outpatient Prescriptions on File Prior to Visit  Medication Sig Dispense Refill  . amLODipine (NORVASC) 5 MG tablet TAKE 1 TABLET BY MOUTH DAILY  90 tablet  1  . aspirin EC 81 MG tablet Take 1 tablet (81 mg total) by mouth daily.      . citalopram (CELEXA) 10 MG tablet Take 1 tablet (10 mg total) by mouth daily.  90 tablet  1  . HYDROcodone-acetaminophen (NORCO) 7.5-325 MG per tablet Take 1 tablet by mouth every 6 (six) hours as needed.      Marland Kitchen LIVALO 4 MG TABS TAKE 1 TABLET BY MOUTH DAILY  90 tablet  1  . metFORMIN (GLUCOPHAGE) 500 MG tablet TAKE 1 TABLET BY MOUTH TWICE A DAY WITH A MEAL  180 tablet  1  . Multiple Vitamins-Minerals (MULTI-VITAMIN GUMMIES PO) Take 1 tablet by mouth daily.      .  Omega-3 Fatty Acids (FISH OIL) 1000 MG CAPS Take 2 capsules by mouth daily.      . valsartan (DIOVAN) 160 MG tablet TAKE 1 TABLET BY MOUTH EVERY DAY  90 tablet  1  . VESICARE 5 MG tablet TAKE 1 TABLET BY MOUTH EVERY DAY  90 tablet  1  . zolpidem (AMBIEN) 10 MG tablet TAKE 1 TABLET BY MOUTH AT BEDTIME AS NEEDED  30 tablet  3   No current facility-administered medications on file prior to visit.    BP 126/80  Temp(Src) 98.4 F (36.9 C) (Oral)  Wt 232 lb (105.235 kg)    Objective:   Physical Exam  Constitutional: He appears well-developed and well-nourished.  Cardiovascular: Normal rate, regular rhythm and normal heart sounds.   Pulmonary/Chest: Effort normal and breath sounds normal. He has no wheezes.  Musculoskeletal:  Left knee - medial joint tenderness          Assessment & Plan:

## 2013-12-24 NOTE — Progress Notes (Signed)
Pre visit review using our clinic review tool, if applicable. No additional management support is needed unless otherwise documented below in the visit note. 

## 2013-12-24 NOTE — Assessment & Plan Note (Addendum)
Patient experiencing persistent left knee pain.  Question neuropathic in origin. Proceed with second opinion with Dr. Berenice Primas. I recommended against using nortriptyline since he is taking citalopram. There is risk of QT prolongation.  Patient's current orthopedic specialists has proposed possible nerve block near lumbar area and/or spinal cord stimulator for pain control. Patient would like second opinion before proceeding.  Retry Lyrica-start at 50 mg twice daily. Patient understands we can gradually increase dose up to 150 mg twice daily.

## 2014-01-07 ENCOUNTER — Encounter: Payer: Self-pay | Admitting: Internal Medicine

## 2014-01-31 ENCOUNTER — Other Ambulatory Visit: Payer: Self-pay | Admitting: Internal Medicine

## 2014-02-04 ENCOUNTER — Ambulatory Visit: Payer: Self-pay | Admitting: Internal Medicine

## 2014-02-05 ENCOUNTER — Other Ambulatory Visit: Payer: Self-pay | Admitting: Internal Medicine

## 2014-02-06 ENCOUNTER — Encounter: Payer: Self-pay | Admitting: Internal Medicine

## 2014-02-07 ENCOUNTER — Other Ambulatory Visit: Payer: Self-pay | Admitting: Internal Medicine

## 2014-02-07 DIAGNOSIS — M25562 Pain in left knee: Secondary | ICD-10-CM

## 2014-02-07 MED ORDER — ONDANSETRON HCL 4 MG PO TABS: 4.0000 mg | ORAL_TABLET | Freq: Two times a day (BID) | ORAL | Status: DC | PRN

## 2014-02-07 NOTE — Assessment & Plan Note (Signed)
Received MyChart note from patient.  He complains of nausea associated with taking his narcotic pain medication.  Dr. Maxie Better was prescribing but he requests we take over prescription.  Patient advised to take medication short term.  He is to also watch for constipation.

## 2014-02-09 ENCOUNTER — Ambulatory Visit: Payer: Managed Care, Other (non HMO) | Admitting: Family Medicine

## 2014-02-15 ENCOUNTER — Other Ambulatory Visit (INDEPENDENT_AMBULATORY_CARE_PROVIDER_SITE_OTHER): Payer: Managed Care, Other (non HMO)

## 2014-02-15 ENCOUNTER — Encounter: Payer: Self-pay | Admitting: Internal Medicine

## 2014-02-15 DIAGNOSIS — E785 Hyperlipidemia, unspecified: Secondary | ICD-10-CM

## 2014-02-15 DIAGNOSIS — D649 Anemia, unspecified: Secondary | ICD-10-CM

## 2014-02-15 DIAGNOSIS — R7309 Other abnormal glucose: Secondary | ICD-10-CM

## 2014-02-15 LAB — LIPID PANEL
CHOL/HDL RATIO: 4
CHOLESTEROL: 150 mg/dL (ref 0–200)
HDL: 40 mg/dL (ref >39.00)
LDL CALC: 89 mg/dL (ref 0–99)
NonHDL: 110
Triglycerides: 105 mg/dL (ref 0.0–149.0)
VLDL: 21 mg/dL (ref 0.0–40.0)

## 2014-02-15 LAB — CBC WITH DIFFERENTIAL/PLATELET
BASOS PCT: 0.3 % (ref 0.0–3.0)
Basophils Absolute: 0 10*3/uL (ref 0.0–0.1)
Eosinophils Absolute: 0.2 10*3/uL (ref 0.0–0.7)
Eosinophils Relative: 2.1 % (ref 0.0–5.0)
HEMATOCRIT: 40.4 % (ref 39.0–52.0)
Hemoglobin: 13.4 g/dL (ref 13.0–17.0)
LYMPHS ABS: 2.5 10*3/uL (ref 0.7–4.0)
Lymphocytes Relative: 32.9 % (ref 12.0–46.0)
MCHC: 33.2 g/dL (ref 30.0–36.0)
MCV: 89.3 fl (ref 78.0–100.0)
MONO ABS: 0.5 10*3/uL (ref 0.1–1.0)
Monocytes Relative: 6.9 % (ref 3.0–12.0)
NEUTROS ABS: 4.3 10*3/uL (ref 1.4–7.7)
NEUTROS PCT: 57.8 % (ref 43.0–77.0)
Platelets: 251 10*3/uL (ref 150.0–400.0)
RBC: 4.52 Mil/uL (ref 4.22–5.81)
RDW: 13.4 % (ref 11.5–15.5)
WBC: 7.5 10*3/uL (ref 4.0–10.5)

## 2014-02-15 LAB — HEPATIC FUNCTION PANEL
ALT: 27 U/L (ref 0–53)
AST: 19 U/L (ref 0–37)
Albumin: 3.8 g/dL (ref 3.5–5.2)
Alkaline Phosphatase: 54 U/L (ref 39–117)
BILIRUBIN TOTAL: 0.4 mg/dL (ref 0.2–1.2)
Bilirubin, Direct: 0.1 mg/dL (ref 0.0–0.3)
Total Protein: 6.8 g/dL (ref 6.0–8.3)

## 2014-02-15 LAB — TSH: TSH: 0.78 u[IU]/mL (ref 0.35–4.50)

## 2014-02-15 LAB — HEMOGLOBIN A1C: HEMOGLOBIN A1C: 6.5 % (ref 4.6–6.5)

## 2014-02-15 LAB — BASIC METABOLIC PANEL
BUN: 12 mg/dL (ref 6–23)
CHLORIDE: 103 meq/L (ref 96–112)
CO2: 25 mEq/L (ref 19–32)
Calcium: 8.5 mg/dL (ref 8.4–10.5)
Creatinine, Ser: 0.8 mg/dL (ref 0.4–1.5)
GFR: 104.08 mL/min (ref >60.00)
Glucose, Bld: 117 mg/dL — ABNORMAL HIGH (ref 70–99)
Potassium: 3.8 mEq/L (ref 3.5–5.1)
SODIUM: 137 meq/L (ref 135–145)

## 2014-02-27 ENCOUNTER — Other Ambulatory Visit: Payer: Self-pay | Admitting: Internal Medicine

## 2014-03-02 ENCOUNTER — Encounter: Payer: Self-pay | Admitting: Internal Medicine

## 2014-03-02 MED ORDER — ZOLPIDEM TARTRATE 10 MG PO TABS: ORAL_TABLET | ORAL | Status: DC

## 2014-03-02 NOTE — Telephone Encounter (Signed)
Rx sent to pharmacy   

## 2014-03-16 ENCOUNTER — Ambulatory Visit: Payer: Self-pay | Admitting: Internal Medicine

## 2014-03-17 NOTE — Telephone Encounter (Signed)
Ok x3 per Dr Shawna Orleans

## 2014-04-20 ENCOUNTER — Ambulatory Visit: Payer: Managed Care, Other (non HMO) | Admitting: Internal Medicine

## 2014-04-27 ENCOUNTER — Encounter: Payer: Self-pay | Admitting: Internal Medicine

## 2014-04-27 ENCOUNTER — Ambulatory Visit (INDEPENDENT_AMBULATORY_CARE_PROVIDER_SITE_OTHER): Payer: Managed Care, Other (non HMO) | Admitting: Internal Medicine

## 2014-04-27 VITALS — BP 114/82 | HR 84 | Temp 98.2°F | Ht 72.0 in | Wt 233.0 lb

## 2014-04-27 DIAGNOSIS — F4323 Adjustment disorder with mixed anxiety and depressed mood: Secondary | ICD-10-CM

## 2014-04-27 DIAGNOSIS — R079 Chest pain, unspecified: Secondary | ICD-10-CM

## 2014-04-27 MED ORDER — ALPRAZOLAM 0.25 MG PO TABS: 0.2500 mg | ORAL_TABLET | Freq: Two times a day (BID) | ORAL | Status: DC | PRN

## 2014-04-27 MED ORDER — CITALOPRAM HYDROBROMIDE 20 MG PO TABS: 20.0000 mg | ORAL_TABLET | Freq: Every day | ORAL | Status: DC

## 2014-04-27 NOTE — Progress Notes (Signed)
Pre visit review using our clinic review tool, if applicable. No additional management support is needed unless otherwise documented below in the visit note. 

## 2014-04-27 NOTE — Assessment & Plan Note (Addendum)
64 year old white male complains of diffuse chest tightness over the last 24-48 hours. His symptoms are non exertional. His symptoms associated with very stressful work environment. Patient also worrying about wife who has metastatic breast cancer. His EKG is unchanged. I suspect his symptoms related to anxiety.

## 2014-04-27 NOTE — Progress Notes (Signed)
Subjective:    Patient ID: Dylan [name], male    DOB: 03/10/1950, 64 y.o.   MRN: [id number]  HPI  64 year old white male with history of hypertension, type 2 diabetes and hyperlipidemia for follow-up.  Patient reports significant increase in stress in work environment.  He is experiencing significant anxiety that is worse in the last 2 weeks.  Patient also worried about his wife's health status. She has metastatic breast cancer.  Over the last 24-48 hours he has experienced mild diffuse chest tightness. His symptoms are nonexertional. He denies any associated neck pain or left arm pain. No associated shortness of breath.   Review of Systems Anxiety, negative for shortness of breath    Past Medical History  Diagnosis Date  . Allergy     allergic rhinitis  . Hyperlipidemia   . History of ulcerative colitis     non recent flares  . History of melanoma 15 yrs ago    face and forehead-- previously followed by Derm in [place]  . Diabetes mellitus   . Hypertension   . Sleep apnea     last sleep study 10 yrs ago,CPAP  . Complication of anesthesia     trouble waking up with succycholine, does not have enzyme to break down succycholine  . Hepatitis age 39    history of infections Hepatitis type a  . Headache(784.0)   . Cancer     melonoma    History   Social History  . Marital Status: Married    Spouse Name: N/A  . Number of Children: N/A  . Years of Education: N/A   Occupational History  . Not on file.   Social History Main Topics  . Smoking status: Never Smoker   . Smokeless tobacco: Former Systems developer    Quit date: 02/26/2003  . Alcohol Use: No  . Drug Use: No  . Sexual Activity: Not on file   Other Topics Concern  . Not on file   Social History Narrative   Wife works at cath lab at [employer] advanced breast cancer    Past Surgical History  Procedure Laterality Date  . Tonsillectomy  1969  . Knee surgery  yrs ago    right knee  . Anterior cervical  decomp/discectomy fusion  10/15/2011    Procedure: ANTERIOR CERVICAL DECOMPRESSION/DISCECTOMY FUSION 1 LEVEL;  Surgeon: [name], MD;  Location: [facility];  Service: Neurosurgery;  Laterality: N/A;  Cervical four-five Anterior cervical decompression/diskectomy/fusion  . Acl and mensicus repair reconstruct Right 1980's  . Knee arthroscopy Left last done oct 2013    x 3  . Appendectomy  1969  . 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 Cancer Brother     brain tumor  . Diabetes Other   . Cancer Other     ovarian    Allergies  Allergen Reactions  . Vancomycin Hives    Ran off a pump. So developed redman's . Running too fast  . Heparin     REACTION: breathing problems  . Penicillins     REACTION: Rash  . Succinylcholine Chloride     REACTION: does not wake up from anesthetic    Current Outpatient Prescriptions on File Prior to Visit  Medication Sig Dispense Refill  . amLODipine (NORVASC) 5 MG tablet TAKE 1  TABLET BY MOUTH DAILY 90 tablet 1  . aspirin EC 81 MG tablet Take 1 tablet (81 mg total) by mouth daily.    Marland Kitchen HYDROcodone-acetaminophen (NORCO) 7.5-325 MG per tablet Take 1 tablet by mouth every 6 (six) hours as needed.    Marland Kitchen LIVALO 4 MG TABS TAKE 1 TABLET BY MOUTH EVERY DAY 90 tablet 1  . meloxicam (MOBIC) 15 MG tablet TAKE 1/2 TABLET BY MOUTH TWICE A DAY AS NEEDED FOR PAIN 30 tablet 1  . metFORMIN (GLUCOPHAGE) 500 MG tablet TAKE 1 TABLET BY MOUTH TWICE A DAY WITH A MEAL 180 tablet 1  . Multiple Vitamins-Minerals (MULTI-VITAMIN GUMMIES PO) Take 1 tablet by mouth daily.    . Omega-3 Fatty Acids (FISH OIL) 1000 MG CAPS Take 2 capsules by mouth daily.    . ondansetron (ZOFRAN) 4 MG tablet Take 1 tablet (4 mg total) by mouth 2 (two) times daily as needed for nausea. 30 tablet 0  . pregabalin  (LYRICA) 50 MG capsule Take 1 capsule (50 mg total) by mouth 2 (two) times daily. 60 capsule 2  . valsartan (DIOVAN) 160 MG tablet TAKE 1 TABLET BY MOUTH EVERY DAY 90 tablet 1  . VESICARE 5 MG tablet TAKE 1 TABLET BY MOUTH EVERY DAY 90 tablet 1  . zolpidem (AMBIEN) 10 MG tablet TAKE 1 TABLET BY MOUTH AT BEDTIME 30 tablet 3   No current facility-administered medications on file prior to visit.    BP 114/82 mmHg  Pulse 84  Temp(Src) 98.2 F (36.8 C) (Oral)  Ht 6' (1.829 m)  Wt 233 lb (105.688 kg)  BMI 31.59 kg/m2  EKG shows normal sinus rhythm at 76 bpm. 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 No significant change compared to previous EKG.  Objective:   Physical Exam  Constitutional: He is oriented to person, place, and time. He appears well-developed and well-nourished. No distress.  HENT:  Head: Normocephalic and atraumatic.  Cardiovascular: Normal rate, regular rhythm and normal heart sounds.   No murmur heard. No chest wall tenderness  Pulmonary/Chest: Effort normal and breath sounds normal. He has no wheezes.  Musculoskeletal: He exhibits no edema.  Neurological: He is alert and oriented to person, place, and time.  Skin: Skin is warm and dry.  Psychiatric: He has a normal mood and affect. His behavior is normal.          Assessment & Plan:

## 2014-04-27 NOTE — Assessment & Plan Note (Signed)
Patient experiencing significant exacerbation of anxiety symptoms due to work stressors. Increase citalopram to 20 mg. Use alprazolam 0.25 mg twice daily as needed. Reassess in 1 month.

## 2014-05-16 ENCOUNTER — Ambulatory Visit: Payer: Managed Care, Other (non HMO) | Admitting: Internal Medicine

## 2014-05-26 ENCOUNTER — Encounter: Payer: Self-pay | Admitting: Internal Medicine

## 2014-05-27 ENCOUNTER — Ambulatory Visit: Payer: Managed Care, Other (non HMO) | Admitting: Internal Medicine

## 2014-05-27 ENCOUNTER — Encounter: Payer: Self-pay | Admitting: Internal Medicine

## 2014-05-27 MED ORDER — TRAMADOL HCL 50 MG PO TABS: 50.0000 mg | ORAL_TABLET | Freq: Three times a day (TID) | ORAL | Status: DC | PRN

## 2014-05-30 ENCOUNTER — Encounter: Payer: Self-pay | Admitting: Internal Medicine

## 2014-05-30 ENCOUNTER — Ambulatory Visit (INDEPENDENT_AMBULATORY_CARE_PROVIDER_SITE_OTHER): Payer: Managed Care, Other (non HMO) | Admitting: Internal Medicine

## 2014-05-30 VITALS — BP 160/90 | HR 87 | Temp 98.1°F | Resp 20 | Ht 72.0 in | Wt 222.0 lb

## 2014-05-30 DIAGNOSIS — E119 Type 2 diabetes mellitus without complications: Secondary | ICD-10-CM

## 2014-05-30 DIAGNOSIS — IMO0001 Reserved for inherently not codable concepts without codable children: Secondary | ICD-10-CM

## 2014-05-30 DIAGNOSIS — N400 Enlarged prostate without lower urinary tract symptoms: Secondary | ICD-10-CM

## 2014-05-30 DIAGNOSIS — R300 Dysuria: Secondary | ICD-10-CM | POA: Diagnosis not present

## 2014-05-30 DIAGNOSIS — R350 Frequency of micturition: Secondary | ICD-10-CM | POA: Diagnosis not present

## 2014-05-30 LAB — POCT URINALYSIS DIPSTICK
BILIRUBIN UA: NEGATIVE
GLUCOSE UA: NEGATIVE
Ketones, UA: NEGATIVE
Leukocytes, UA: NEGATIVE
Nitrite, UA: NEGATIVE
Protein, UA: NEGATIVE
RBC UA: NEGATIVE
SPEC GRAV UA: 1.015
Urobilinogen, UA: 0.2
pH, UA: 5.5

## 2014-05-30 MED ORDER — TAMSULOSIN HCL 0.4 MG PO CAPS: 0.4000 mg | ORAL_CAPSULE | Freq: Every day | ORAL | Status: DC

## 2014-05-30 NOTE — Progress Notes (Signed)
Pre visit review using our clinic review tool, if applicable. No additional management support is needed unless otherwise documented below in the visit note. 

## 2014-05-30 NOTE — Patient Instructions (Addendum)
Follow-up with Dr. Shawna Orleans  in 4 days as scheduled  Benign Prostatic Hyperplasia An enlarged prostate (benign prostatic hyperplasia) is common in older men. You may experience the following:  Weak urine stream.  Dribbling.  Feeling like the bladder has not emptied completely.  Difficulty starting urination.  Getting up frequently at night to urinate.  Urinating more frequently during the day. HOME CARE INSTRUCTIONS  Monitor your prostatic hyperplasia for any changes. The following actions may help to alleviate any discomfort you are experiencing:  Give yourself time when you urinate.  Stay away from alcohol.  Avoid beverages containing caffeine, such as coffee, tea, and colas, because they can make the problem worse.  Avoid decongestants, antihistamines, and some prescription medicines that can make the problem worse.  Follow up with your health care provider for further treatment as recommended. SEEK MEDICAL CARE IF:  You are experiencing progressive difficulty voiding.  Your urine stream is progressively getting narrower.  You are awaking from sleep with the urge to void more frequently.  You are constantly feeling the need to void.  You experience loss of urine, especially in small amounts. SEEK IMMEDIATE MEDICAL CARE IF:   You develop increased pain with urination or are unable to urinate.  You develop severe abdominal pain, vomiting, a high fever, or fainting.  You develop back pain or blood in your urine. MAKE SURE YOU:   Understand these instructions.  Will watch your condition.  Will get help right away if you are not doing well or get worse. Document Released: 02/11/2005 Document Revised: 10/14/2012 Document Reviewed: 07/14/2012 [facility] Patient Information 2015 [place], [place]. This information is not intended to replace advice given to you by your health care provider. Make sure you discuss any questions you have with your health care provider.

## 2014-05-30 NOTE — Progress Notes (Signed)
Subjective:    Patient ID: Dylan [name], male    DOB: Nov 16, 1950, 64 y.o.   MRN: [id number]  HPI 64 year old patient who presents with a one-week history of urinary urgency.  He describes a diminished stream and more recently some dysuria and frequency.  Last night he had nocturia times 5-6.  Yesterday he took Cipro, and antibiotic he had on hand for prophylaxis for dental procedures due to total knee replacement surgery.  He has seen urology in the past and has been prescribed Vesicare.  He also has a history of BPH but apparently has not benefited from Flomax in the past. Urinalysis was reviewed today and was negative.  Past Medical History  Diagnosis Date  . Allergy     allergic rhinitis  . Hyperlipidemia   . History of ulcerative colitis     non recent flares  . History of melanoma 15 yrs ago    face and forehead-- previously followed by Derm in [place]  . Diabetes mellitus   . Hypertension   . Sleep apnea     last sleep study 10 yrs ago,CPAP  . Complication of anesthesia     trouble waking up with succycholine, does not have enzyme to break down succycholine  . Hepatitis age 31    history of infections Hepatitis type a  . Headache(784.0)   . Cancer     melonoma    History   Social History  . Marital Status: Married    Spouse Name: N/A  . Number of Children: N/A  . Years of Education: N/A   Occupational History  . Not on file.   Social History Main Topics  . Smoking status: Never Smoker   . Smokeless tobacco: Former Systems developer    Quit date: 02/26/2003  . Alcohol Use: No  . Drug Use: No  . Sexual Activity: Not on file   Other Topics Concern  . Not on file   Social History Narrative   Wife works at cath lab at [employer] advanced breast cancer    Past Surgical History  Procedure Laterality Date  . Tonsillectomy  1969  . Knee surgery  yrs ago    right knee  . Anterior cervical decomp/discectomy fusion  10/15/2011    Procedure: ANTERIOR CERVICAL  DECOMPRESSION/DISCECTOMY FUSION 1 LEVEL;  Surgeon: [name], MD;  Location: [facility];  Service: Neurosurgery;  Laterality: N/A;  Cervical four-five Anterior cervical decompression/diskectomy/fusion  . Acl and mensicus repair reconstruct Right 1980's  . Knee arthroscopy Left last done oct 2013    x 3  . Appendectomy  1969  . 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 Cancer Brother     brain tumor  . Diabetes Other   . Cancer Other     ovarian    Allergies  Allergen Reactions  . Vancomycin Hives    Ran off a pump. So developed redman's . Running too fast  . Heparin     REACTION: breathing problems  . Penicillins     REACTION: Rash  . Succinylcholine Chloride     REACTION: does not wake up from anesthetic    Current Outpatient Prescriptions on File Prior to Visit  Medication Sig Dispense Refill  . ALPRAZolam (XANAX) 0.25 MG tablet Take 1 tablet (0.25 mg total) by mouth  2 (two) times daily as needed for anxiety. 30 tablet 2  . amLODipine (NORVASC) 5 MG tablet TAKE 1 TABLET BY MOUTH DAILY 90 tablet 1  . aspirin EC 81 MG tablet Take 1 tablet (81 mg total) by mouth daily.    . citalopram (CELEXA) 20 MG tablet Take 1 tablet (20 mg total) by mouth daily. 90 tablet 1  . LIVALO 4 MG TABS TAKE 1 TABLET BY MOUTH EVERY DAY 90 tablet 1  . metFORMIN (GLUCOPHAGE) 500 MG tablet TAKE 1 TABLET BY MOUTH TWICE A DAY WITH A MEAL 180 tablet 1  . Multiple Vitamins-Minerals (MULTI-VITAMIN GUMMIES PO) Take 1 tablet by mouth daily.    . Omega-3 Fatty Acids (FISH OIL) 1000 MG CAPS Take 2 capsules by mouth daily.    . ondansetron (ZOFRAN) 4 MG tablet Take 1 tablet (4 mg total) by mouth 2 (two) times daily as needed for nausea. 30 tablet 0  . traMADol (ULTRAM) 50 MG tablet Take 1 tablet (50 mg total) by mouth every 8  (eight) hours as needed. 30 tablet 0  . valsartan (DIOVAN) 160 MG tablet TAKE 1 TABLET BY MOUTH EVERY DAY 90 tablet 1  . VESICARE 5 MG tablet TAKE 1 TABLET BY MOUTH EVERY DAY 90 tablet 1  . zolpidem (AMBIEN) 10 MG tablet TAKE 1 TABLET BY MOUTH AT BEDTIME 30 tablet 3   No current facility-administered medications on file prior to visit.    BP 160/90 mmHg  Pulse 87  Temp(Src) 98.1 F (36.7 C) (Oral)  Resp 20  Ht 6' (1.829 m)  Wt 222 lb (100.699 kg)  BMI 30.10 kg/m2  SpO2 98%      Review of Systems  Constitutional: Negative for fever, chills, appetite change and fatigue.  HENT: Negative for congestion, dental problem, ear pain, hearing loss, sore throat, tinnitus, trouble swallowing and voice change.   Eyes: Negative for pain, discharge and visual disturbance.  Respiratory: Negative for cough, chest tightness, wheezing and stridor.   Cardiovascular: Negative for chest pain, palpitations and leg swelling.  Gastrointestinal: Negative for nausea, vomiting, abdominal pain, diarrhea, constipation, blood in stool and abdominal distention.  Genitourinary: Positive for dysuria, urgency, frequency and decreased urine volume. Negative for hematuria, flank pain, discharge, difficulty urinating and genital sores.  Musculoskeletal: Negative for myalgias, back pain, joint swelling, arthralgias, gait problem and neck stiffness.  Skin: Negative for rash.  Neurological: Negative for dizziness, syncope, speech difficulty, weakness, numbness and headaches.  Hematological: Negative for adenopathy. Does not bruise/bleed easily.  Psychiatric/Behavioral: Negative for behavioral problems and dysphoric mood. The patient is not nervous/anxious.        Objective:   Physical Exam  Constitutional: He appears well-developed and well-nourished. No distress.  Blood pressure on arrival 160 over 90 Repeat blood pressure 140/80          Assessment & Plan:   History of BPH.  Symptoms certainly sound  obstructive without evidence of UTI.  Patient did take an antibiotic yesterday, probably Cipro.  Will rechallenge with Flomax at a twice a day dose.  He is scheduled for follow-up with his PCP in 4 days.  We'll reassess at that time and consider dose reduction to daily use

## 2014-06-01 ENCOUNTER — Other Ambulatory Visit: Payer: Self-pay | Admitting: *Deleted

## 2014-06-01 MED ORDER — PRAVASTATIN SODIUM 20 MG PO TABS: 20.0000 mg | ORAL_TABLET | Freq: Every day | ORAL | Status: DC

## 2014-06-03 ENCOUNTER — Encounter: Payer: Self-pay | Admitting: Internal Medicine

## 2014-06-03 ENCOUNTER — Ambulatory Visit (INDEPENDENT_AMBULATORY_CARE_PROVIDER_SITE_OTHER): Payer: Managed Care, Other (non HMO) | Admitting: Internal Medicine

## 2014-06-03 VITALS — BP 140/80 | HR 80 | Temp 98.5°F | Wt 228.5 lb

## 2014-06-03 DIAGNOSIS — N400 Enlarged prostate without lower urinary tract symptoms: Secondary | ICD-10-CM | POA: Diagnosis not present

## 2014-06-03 DIAGNOSIS — R110 Nausea: Secondary | ICD-10-CM

## 2014-06-03 DIAGNOSIS — F4323 Adjustment disorder with mixed anxiety and depressed mood: Secondary | ICD-10-CM

## 2014-06-03 MED ORDER — ESOMEPRAZOLE MAGNESIUM 40 MG PO CPDR: 40.0000 mg | DELAYED_RELEASE_CAPSULE | Freq: Every day | ORAL | Status: DC

## 2014-06-03 MED ORDER — FINASTERIDE 5 MG PO TABS: 5.0000 mg | ORAL_TABLET | Freq: Every day | ORAL | Status: DC

## 2014-06-03 NOTE — Assessment & Plan Note (Signed)
Improved.  Continue higher dose of citalopram.

## 2014-06-03 NOTE — Assessment & Plan Note (Addendum)
Patient could not tolerate twice a day dosing of Flomax due to side effect of dizziness.  Retry adding finasteride.  Patient encouraged to decrease caffeine intake. Persistent symptoms we discussed possible referral to urology.

## 2014-06-03 NOTE — Progress Notes (Signed)
Subjective:    Patient ID: Dylan [name], male    DOB: [birth year], 64 y.o.   MRN: [id number]  HPI  64 year old white male with history of mild type 2 diabetes, hypertension and hyperlipidemia presents for follow-up visit regarding BPH symptoms.  Patient recently seen by Dr. Burnice Logan. He complained of difficulty with weak urine stream. Patient's Flomax increased to 0.4 mg twice daily.  Patient had discontinued morning dose of Flomax due to symptoms of dizziness/lightheadedness. He is accompanied by his supportive daughter. He also decreased his caffeine use which has also helped.  Patient also complains of chronic nausea for last 3-4 months.  His symptoms not associated with food.He denies any abdominal pain. His daughter suggested use of over-the-counter Nexium with some improvement of his symptoms.  Review of Systems Tolerating higher dose of citalopram.  Nausea not related to any of his medications.    Past Medical History  Diagnosis Date  . Allergy     allergic rhinitis  . Hyperlipidemia   . History of ulcerative colitis     non recent flares  . History of melanoma 15 yrs ago    face and forehead-- previously followed by Derm in [place]  . Diabetes mellitus   . Hypertension   . Sleep apnea     last sleep study 10 yrs ago,CPAP  . Complication of anesthesia     trouble waking up with succycholine, does not have enzyme to break down succycholine  . Hepatitis age 64    history of infections Hepatitis type a  . Headache(784.0)   . Cancer     melonoma    History   Social History  . Marital Status: Married    Spouse Name: N/A  . Number of Children: N/A  . Years of Education: N/A   Occupational History  . Not on file.   Social History Main Topics  . Smoking status: Never Smoker   . Smokeless tobacco: Former Systems developer    Quit date: 02/26/2003  . Alcohol Use: No  . Drug Use: No  . Sexual Activity: Not on file   Other Topics Concern  . Not on file   Social History  Narrative   Wife works at cath lab at [employer] advanced breast cancer    Past Surgical History  Procedure Laterality Date  . Tonsillectomy  1969  . Knee surgery  yrs ago    right knee  . Anterior cervical decomp/discectomy fusion  10/15/2011    Procedure: ANTERIOR CERVICAL DECOMPRESSION/DISCECTOMY FUSION 1 LEVEL;  Surgeon: [name], MD;  Location: [facility];  Service: Neurosurgery;  Laterality: N/A;  Cervical four-five Anterior cervical decompression/diskectomy/fusion  . Acl and mensicus repair reconstruct Right 1980's  . Knee arthroscopy Left last done oct 2013    x 3  . Appendectomy  1969  . 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 Cancer Brother     brain tumor  . Diabetes Other   . Cancer Other     ovarian    Allergies  Allergen Reactions  . Vancomycin Hives    Ran off a pump. So developed redman's . Running too fast  . Heparin     REACTION: breathing problems  . Penicillins     REACTION: Rash  . Succinylcholine Chloride     REACTION:  does not wake up from anesthetic    Current Outpatient Prescriptions on File Prior to Visit  Medication Sig Dispense Refill  . ALPRAZolam (XANAX) 0.25 MG tablet Take 1 tablet (0.25 mg total) by mouth 2 (two) times daily as needed for anxiety. 30 tablet 2  . amLODipine (NORVASC) 5 MG tablet TAKE 1 TABLET BY MOUTH DAILY 90 tablet 1  . aspirin EC 81 MG tablet Take 1 tablet (81 mg total) by mouth daily.    . citalopram (CELEXA) 20 MG tablet Take 1 tablet (20 mg total) by mouth daily. 90 tablet 1  . metFORMIN (GLUCOPHAGE) 500 MG tablet TAKE 1 TABLET BY MOUTH TWICE A DAY WITH A MEAL 180 tablet 1  . Omega-3 Fatty Acids (FISH OIL) 1000 MG CAPS Take 2 capsules by mouth daily.    . ondansetron (ZOFRAN) 4 MG tablet Take 1 tablet (4 mg total) by mouth 2 (two)  times daily as needed for nausea. 30 tablet 0  . pravastatin (PRAVACHOL) 20 MG tablet Take 1 tablet (20 mg total) by mouth daily. 30 tablet 3  . tamsulosin (FLOMAX) 0.4 MG CAPS capsule Take 1 capsule (0.4 mg total) by mouth daily. 90 capsule 3  . traMADol (ULTRAM) 50 MG tablet Take 1 tablet (50 mg total) by mouth every 8 (eight) hours as needed. 30 tablet 0  . valsartan (DIOVAN) 160 MG tablet TAKE 1 TABLET BY MOUTH EVERY DAY 90 tablet 1  . VESICARE 5 MG tablet TAKE 1 TABLET BY MOUTH EVERY DAY 90 tablet 1  . zolpidem (AMBIEN) 10 MG tablet TAKE 1 TABLET BY MOUTH AT BEDTIME 30 tablet 3   No current facility-administered medications on file prior to visit.    BP 140/80 mmHg  Pulse 80  Temp(Src) 98.5 F (36.9 C) (Oral)  Wt 228 lb 8 oz (103.647 kg)    Objective:   Physical Exam  Constitutional: He is oriented to person, place, and time. He appears well-developed and well-nourished. No distress.  HENT:  Head: Normocephalic and atraumatic.  Neck: Neck supple.  Cardiovascular: Normal rate, regular rhythm and normal heart sounds.  Exam reveals no gallop.   No murmur heard. Pulmonary/Chest: Effort normal and breath sounds normal. No respiratory distress.  Genitourinary: Rectum normal and prostate normal. Guaiac negative stool.  Musculoskeletal: Normal range of motion.  Neurological: He is alert and oriented to person, place, and time. No cranial nerve deficit.  Skin: Skin is warm and dry.  Psychiatric: He has a normal mood and affect. His behavior is normal.          Assessment & Plan:

## 2014-06-03 NOTE — Progress Notes (Signed)
Pre visit review using our clinic review tool, if applicable. No additional management support is needed unless otherwise documented below in the visit note. 

## 2014-06-03 NOTE — Assessment & Plan Note (Signed)
Patient experiencing unexplained nausea over the last 3-4 months. Unclear whether her symptoms related to GERD. Trial of Nexium 40 mg once daily. Obtain LFTs, basic metabolic panel, and CBCD.  Check abdominal US.  If no improvement consider CT of abd and pelvis and GI referral.

## 2014-06-04 ENCOUNTER — Other Ambulatory Visit: Payer: Self-pay | Admitting: Internal Medicine

## 2014-06-04 LAB — CBC WITH DIFFERENTIAL/PLATELET
BASOS ABS: 0.1 10*3/uL (ref 0.0–0.1)
Basophils Relative: 1 % (ref 0–1)
Eosinophils Absolute: 0.2 10*3/uL (ref 0.0–0.7)
Eosinophils Relative: 2 % (ref 0–5)
HCT: 41.5 % (ref 39.0–52.0)
Hemoglobin: 14.3 g/dL (ref 13.0–17.0)
LYMPHS PCT: 36 % (ref 12–46)
Lymphs Abs: 3.2 10*3/uL (ref 0.7–4.0)
MCH: 30.1 pg (ref 26.0–34.0)
MCHC: 34.5 g/dL (ref 30.0–36.0)
MCV: 87.4 fL (ref 78.0–100.0)
MPV: 9.8 fL (ref 8.6–12.4)
Monocytes Absolute: 0.8 10*3/uL (ref 0.1–1.0)
Monocytes Relative: 9 % (ref 3–12)
NEUTROS ABS: 4.6 10*3/uL (ref 1.7–7.7)
NEUTROS PCT: 52 % (ref 43–77)
Platelets: 280 10*3/uL (ref 150–400)
RBC: 4.75 MIL/uL (ref 4.22–5.81)
RDW: 14.1 % (ref 11.5–15.5)
WBC: 8.8 10*3/uL (ref 4.0–10.5)

## 2014-06-04 LAB — BASIC METABOLIC PANEL
BUN: 16 mg/dL (ref 6–23)
CALCIUM: 9.6 mg/dL (ref 8.4–10.5)
CO2: 28 mEq/L (ref 19–32)
Chloride: 100 mEq/L (ref 96–112)
Creat: 0.81 mg/dL (ref 0.50–1.35)
Glucose, Bld: 82 mg/dL (ref 70–99)
Potassium: 4.1 mEq/L (ref 3.5–5.3)
Sodium: 136 mEq/L (ref 135–145)

## 2014-06-04 LAB — HEPATIC FUNCTION PANEL
ALK PHOS: 56 U/L (ref 39–117)
ALT: 24 U/L (ref 0–53)
AST: 17 U/L (ref 0–37)
Albumin: 4.3 g/dL (ref 3.5–5.2)
BILIRUBIN TOTAL: 0.4 mg/dL (ref 0.2–1.2)
Bilirubin, Direct: 0.1 mg/dL (ref 0.0–0.3)
Indirect Bilirubin: 0.3 mg/dL (ref 0.2–1.2)
Total Protein: 7.3 g/dL (ref 6.0–8.3)

## 2014-06-05 ENCOUNTER — Other Ambulatory Visit: Payer: Self-pay | Admitting: Internal Medicine

## 2014-06-06 ENCOUNTER — Other Ambulatory Visit: Payer: Self-pay | Admitting: Internal Medicine

## 2014-06-06 ENCOUNTER — Encounter: Payer: Self-pay | Admitting: Internal Medicine

## 2014-06-06 MED ORDER — TRAMADOL HCL 50 MG PO TABS: 50.0000 mg | ORAL_TABLET | Freq: Three times a day (TID) | ORAL | Status: DC | PRN

## 2014-06-12 ENCOUNTER — Encounter: Payer: Self-pay | Admitting: Internal Medicine

## 2014-06-12 DIAGNOSIS — M25569 Pain in unspecified knee: Secondary | ICD-10-CM

## 2014-06-13 ENCOUNTER — Telehealth: Payer: Self-pay | Admitting: Internal Medicine

## 2014-06-13 ENCOUNTER — Ambulatory Visit
Admission: RE | Admit: 2014-06-13 | Discharge: 2014-06-13 | Disposition: A | Discharge status: Home / Self Care | Payer: Managed Care, Other (non HMO) | Source: Ambulatory Visit | Attending: Internal Medicine | Admitting: Internal Medicine

## 2014-06-13 ENCOUNTER — Other Ambulatory Visit: Payer: Self-pay | Admitting: Internal Medicine

## 2014-06-13 DIAGNOSIS — K824 Cholesterolosis of gallbladder: Secondary | ICD-10-CM

## 2014-06-13 DIAGNOSIS — R110 Nausea: Secondary | ICD-10-CM

## 2014-06-13 MED ORDER — HYDROCODONE-ACETAMINOPHEN 5-325 MG PO TABS: 1.0000 | ORAL_TABLET | Freq: Two times a day (BID) | ORAL | Status: DC | PRN

## 2014-06-13 NOTE — Telephone Encounter (Signed)
yes

## 2014-06-13 NOTE — Telephone Encounter (Signed)
for [facility]PAT[facility] INCLUDING GB    Does Dr Shawna Orleans want this Scan with ejection fraction? [facility] is asking  [phone]

## 2014-06-22 ENCOUNTER — Encounter: Payer: Self-pay | Admitting: Internal Medicine

## 2014-07-04 ENCOUNTER — Ambulatory Visit (INDEPENDENT_AMBULATORY_CARE_PROVIDER_SITE_OTHER)
Admission: RE | Admit: 2014-07-04 | Discharge: 2014-07-04 | Disposition: A | Discharge status: Home / Self Care | Payer: Managed Care, Other (non HMO) | Source: Ambulatory Visit | Attending: Adult Health | Admitting: Adult Health

## 2014-07-04 ENCOUNTER — Encounter: Payer: Self-pay | Admitting: Internal Medicine

## 2014-07-04 ENCOUNTER — Ambulatory Visit (INDEPENDENT_AMBULATORY_CARE_PROVIDER_SITE_OTHER): Payer: Managed Care, Other (non HMO) | Admitting: Adult Health

## 2014-07-04 ENCOUNTER — Encounter: Payer: Self-pay | Admitting: Adult Health

## 2014-07-04 VITALS — BP 120/80 | Temp 98.1°F | Ht <= 58 in | Wt 236.1 lb

## 2014-07-04 DIAGNOSIS — M25572 Pain in left ankle and joints of left foot: Secondary | ICD-10-CM | POA: Diagnosis not present

## 2014-07-04 DIAGNOSIS — M545 Low back pain, unspecified: Secondary | ICD-10-CM

## 2014-07-04 MED ORDER — METHOCARBAMOL 500 MG PO TABS: 500.0000 mg | ORAL_TABLET | Freq: Three times a day (TID) | ORAL | Status: DC

## 2014-07-04 MED ORDER — DIAZEPAM 5 MG PO TABS: 5.0000 mg | ORAL_TABLET | Freq: Three times a day (TID) | ORAL | Status: DC | PRN

## 2014-07-04 MED ORDER — OXYCODONE HCL 5 MG PO TABS: 10.0000 mg | ORAL_TABLET | ORAL | Status: DC | PRN

## 2014-07-04 NOTE — Patient Instructions (Addendum)
Take Roxicodone 10mg  for break through pain. You can take 600 mg of Ibuprofen every 8 hours.   Use the Valium for severe muscle spasms - do not drive while taking this medications. I have also sent a prescription for Robaxin to the pharmacy, you can use this throughout the day. I will let you know what your x ray shows. Please rest your back, no golf or yard work. Follow up if no improvement in the next 3-4 days.   Back Pain, Adult Low back pain is very common. About 1 in 5 people have back pain.The cause of low back pain is rarely dangerous. The pain often gets better over time.About half of people with a sudden onset of back pain feel better in just 2 weeks. About 8 in 10 people feel better by 6 weeks.  CAUSES Some common causes of back pain include:  Strain of the muscles or ligaments supporting the spine.  Wear and tear (degeneration) of the spinal discs.  Arthritis.  Direct injury to the back. DIAGNOSIS Most of the time, the direct cause of low back pain is not known.However, back pain can be treated effectively even when the exact cause of the pain is unknown.Answering your caregiver's questions about your overall health and symptoms is one of the most accurate ways to make sure the cause of your pain is not dangerous. If your caregiver needs more information, he or she may order lab work or imaging tests (X-rays or MRIs).However, even if imaging tests show changes in your back, this usually does not require surgery. HOME CARE INSTRUCTIONS For many people, back pain returns.Since low back pain is rarely dangerous, it is often a condition that people can learn to [facility] their own.   Remain active. It is stressful on the back to sit or stand in one place. Do not sit, drive, or stand in one place for more than 30 minutes at a time. Take short walks on level surfaces as soon as pain allows.Try to increase the length of time you walk each day.  Do not stay in bed.Resting more than  1 or 2 days can delay your recovery.  Do not avoid exercise or work.Your body is made to move.It is not dangerous to be active, even though your back may hurt.Your back will likely heal faster if you return to being active before your pain is gone.  Pay attention to your body when you bend and lift. Many people have less discomfortwhen lifting if they bend their knees, keep the load close to their bodies,and avoid twisting. Often, the most comfortable positions are those that put less stress on your recovering back.  Find a comfortable position to sleep. Use a firm mattress and lie on your side with your knees slightly bent. If you lie on your back, put a pillow under your knees.  Only take over-the-counter or prescription medicines as directed by your caregiver. Over-the-counter medicines to reduce pain and inflammation are often the most helpful.Your caregiver may prescribe muscle relaxant drugs.These medicines help dull your pain so you can more quickly return to your normal activities and healthy exercise.  Put ice on the injured area.  Put ice in a plastic bag.  Place a towel between your skin and the bag.  Leave the ice on for 15-20 minutes, 03-04 times a day for the first 2 to 3 days. After that, ice and heat may be alternated to reduce pain and spasms.  Ask your caregiver about trying back exercises  and gentle massage. This may be of some benefit.  Avoid feeling anxious or stressed.Stress increases muscle tension and can worsen back pain.It is important to recognize when you are anxious or stressed and learn ways to manage it.Exercise is a great option. SEEK MEDICAL CARE IF:  You have pain that is not relieved with rest or medicine.  You have pain that does not improve in 1 week.  You have new symptoms.  You are generally not feeling well. SEEK IMMEDIATE MEDICAL CARE IF:   You have pain that radiates from your back into your legs.  You develop new bowel or bladder  control problems.  You have unusual weakness or numbness in your arms or legs.  You develop nausea or vomiting.  You develop abdominal pain.  You feel faint. Document Released: 02/11/2005 Document Revised: 08/13/2011 Document Reviewed: 06/15/2013 [facility] Patient Information 2015 [place], [place]. This information is not intended to replace advice given to you by your health care provider. Make sure you discuss any questions you have with your health care provider.

## 2014-07-04 NOTE — Progress Notes (Signed)
   Subjective:    Patient ID: Dylan [name], male    DOB: May 28, 1950, 63 y.o.   MRN: [id number]  HPI  64 year old caus[place] gentleman who presents to the office today with continued lower back pain. He is here with his wife. Per patient he first had the injury 3-4 weeks ago and was prescribed Norco by Dr. Shawna Orleans. He states " the Norco, isn't touching the pain". Over the weekend he was doing yard work and played golf on Sunday, which he thinks may have exacerbated the pain. Has been treating with Norco and heat, he endorses that the heat helps his pain. No issues with bowel or bladder.  Also complains of pain in left ankle. Denies any known trauma to ankle.    Review of Systems  Constitutional: Positive for activity change. Negative for fever, appetite change and fatigue.  Gastrointestinal: Negative.   Genitourinary: Negative.   Musculoskeletal: Positive for myalgias, back pain and gait problem. Negative for joint swelling, arthralgias, neck pain and neck stiffness.  All other systems reviewed and are negative.      Objective:   Physical Exam  Constitutional: He is oriented to person, place, and time. He appears well-developed and well-nourished. He appears distressed (appears in pain).  Cardiovascular: Normal rate, normal heart sounds and intact distal pulses.  Exam reveals no gallop and no friction rub.   No murmur heard. Pulmonary/Chest: Effort normal and breath sounds normal. No respiratory distress. He has no wheezes. He has no rales. He exhibits no tenderness.  Musculoskeletal: He exhibits tenderness.  Pain to the mid and lower right side of back with/without palpation. No redness or warmth. Slight swelling to site of injury   Pain to talus and calcaneous with palpation  Neurological: He is alert and oriented to person, place, and time. He has normal reflexes.  Skin: Skin is warm and dry. No rash noted. He is not diaphoretic. No erythema. No pallor.  Psychiatric: He has a normal  mood and affect. His behavior is normal. Judgment and thought content normal.  Nursing note and vitals reviewed.      Assessment & Plan:  1. Right-sided low back pain without sciatica - diazepam (VALIUM) 5 MG tablet; Take 1 tablet (5 mg total) by mouth every 8 (eight) hours as needed for anxiety or muscle spasms.  Dispense: 30 tablet; Refill: 0 - Do not drive while taking this medication. Only take when not taking Roxicodone or Robaxin. For severe muscle spasms.  - oxyCODONE (ROXICODONE) 5 MG immediate release tablet; Take 2 tablets (10 mg total) by mouth every 4 (four) hours as needed for severe pain.  Dispense: 14 tablet; Refill: 0 - Do not take with Valium and do not drive while taking this medication. Only to be used for breakthrough pain.  - methocarbamol (ROBAXIN) 500 MG tablet; Take 1 tablet (500 mg total) by mouth 3 (three) times daily.  Dispense: 30 tablet; Refill: 0 - Take 600mg  Ibuprofen every 8 hours as needed for pain and inflammation.  - DG Ankle Complete Left; Future - Apply warm compress to area - Follow up if not feeling better in 2-3 days - Consider referral to Sports Medicine

## 2014-07-04 NOTE — Progress Notes (Signed)
Pre visit review using our clinic review tool, if applicable. No additional management support is needed unless otherwise documented below in the visit note. 

## 2014-07-05 ENCOUNTER — Encounter: Payer: Self-pay | Admitting: Adult Health

## 2014-07-05 NOTE — Telephone Encounter (Signed)
Spoke to patient on the phone. He endorses some relief at this point from his back pain. I informed him that he needs to rest and not be up doing things around the house or going to work. If he continues to have severe pain he needs to follow up with MD. Wendy Poet.

## 2014-07-06 ENCOUNTER — Other Ambulatory Visit: Payer: Self-pay | Admitting: Adult Health

## 2014-07-06 ENCOUNTER — Encounter: Payer: Self-pay | Admitting: Adult Health

## 2014-07-06 ENCOUNTER — Encounter: Payer: Self-pay | Admitting: Internal Medicine

## 2014-07-06 ENCOUNTER — Ambulatory Visit (HOSPITAL_COMMUNITY)
Admission: RE | Admit: 2014-07-06 | Discharge: 2014-07-06 | Disposition: A | Discharge status: Home / Self Care | Payer: Managed Care, Other (non HMO) | Source: Ambulatory Visit | Attending: Internal Medicine | Admitting: Internal Medicine

## 2014-07-06 DIAGNOSIS — R112 Nausea with vomiting, unspecified: Secondary | ICD-10-CM | POA: Insufficient documentation

## 2014-07-06 DIAGNOSIS — M546 Pain in thoracic spine: Secondary | ICD-10-CM

## 2014-07-06 DIAGNOSIS — R109 Unspecified abdominal pain: Secondary | ICD-10-CM | POA: Diagnosis present

## 2014-07-06 DIAGNOSIS — K824 Cholesterolosis of gallbladder: Secondary | ICD-10-CM

## 2014-07-06 MED ORDER — SINCALIDE 5 MCG IJ SOLR
INTRAMUSCULAR | Status: AC
  Administered 2014-07-06: 2.1 ug via INTRAVENOUS
  Filled 2014-07-06: qty 5

## 2014-07-06 MED ORDER — SINCALIDE 5 MCG IJ SOLR
0.0200 ug/kg | Freq: Once | INTRAMUSCULAR | Status: AC
  Administered 2014-07-06: 2.1 ug via INTRAVENOUS

## 2014-07-06 MED ORDER — TECHNETIUM TC 99M MEBROFENIN IV KIT
5.0000 | PACK | Freq: Once | INTRAVENOUS | Status: AC | PRN
  Administered 2014-07-06: 5 via INTRAVENOUS

## 2014-07-06 MED ORDER — STERILE WATER FOR INJECTION IJ SOLN
INTRAMUSCULAR | Status: AC
  Administered 2014-07-06: 10 mL
  Filled 2014-07-06: qty 10

## 2014-07-12 ENCOUNTER — Other Ambulatory Visit: Payer: Self-pay | Admitting: Internal Medicine

## 2014-07-24 ENCOUNTER — Other Ambulatory Visit: Payer: Self-pay | Admitting: Internal Medicine

## 2014-08-16 ENCOUNTER — Other Ambulatory Visit: Payer: Self-pay | Admitting: Internal Medicine

## 2014-08-19 ENCOUNTER — Telehealth: Payer: Self-pay | Admitting: Family Medicine

## 2014-08-19 NOTE — Telephone Encounter (Signed)
rx called in

## 2014-08-19 NOTE — Telephone Encounter (Signed)
Ok to RF x 2

## 2014-08-19 NOTE — Telephone Encounter (Signed)
Refill request for Alprazolam 0.25 mg and send to [facility].

## 2014-08-19 NOTE — Telephone Encounter (Signed)
Ok to RF  X 2

## 2014-08-26 ENCOUNTER — Ambulatory Visit (INDEPENDENT_AMBULATORY_CARE_PROVIDER_SITE_OTHER): Payer: Managed Care, Other (non HMO) | Admitting: Adult Health

## 2014-08-26 ENCOUNTER — Encounter: Payer: Self-pay | Admitting: Adult Health

## 2014-08-26 VITALS — BP 128/80 | HR 86 | Temp 98.2°F | Wt 244.0 lb

## 2014-08-26 DIAGNOSIS — H6091 Unspecified otitis externa, right ear: Secondary | ICD-10-CM | POA: Diagnosis not present

## 2014-08-26 MED ORDER — CIPROFLOXACIN-DEXAMETHASONE 0.3-0.1 % OT SUSP: OTIC | Status: DC

## 2014-08-26 NOTE — Progress Notes (Signed)
Pre visit review using our clinic review tool, if applicable. No additional management support is needed unless otherwise documented below in the visit note. 

## 2014-08-26 NOTE — Progress Notes (Signed)
   Subjective:    Patient ID: Dylan [name], male    DOB: 08/05/1950, 64 y.o.   MRN: [id number]  HPI Marya Amsler presents to the office today for right ear pain. The pain feels achy and has been going on for a week and has been getting worse. He has associated right sided jaw pain that feels like " I have a rubber band in my jaw." Has trouble with chewing food on right side due to pain  Has been taking Advil with relief  Denies any clicking in jaw, pain with palpation, tooth pain, fevers,headaches or tinnitus.    Review of Systems  Constitutional: Negative.   HENT: Positive for ear pain. Negative for ear discharge, facial swelling, hearing loss, sinus pressure, tinnitus and trouble swallowing.   Eyes: Negative.   Musculoskeletal: Negative.   Neurological: Negative.   All other systems reviewed and are negative.      Objective:   Physical Exam  Constitutional: He is oriented to person, place, and time. He appears well-developed and well-nourished. No distress.  HENT:  Head: Normocephalic and atraumatic.  Right Ear: External ear normal.  Left Ear: External ear normal.  Nose: Nose normal.  Mouth/Throat: Oropharynx is clear and moist. No oropharyngeal exudate.  Redness in right ear canal. No effusion. Left ear canal WNL  Musculoskeletal: Normal range of motion. He exhibits no edema or tenderness.  No tenderness with palpation to right TMJ. No clicking. Has full ROM  Neurological: He is alert and oriented to person, place, and time.  Skin: Skin is warm and dry. He is not diaphoretic.  Psychiatric: He has a normal mood and affect. His behavior is normal. Judgment and thought content normal.  Nursing note and vitals reviewed.      Assessment & Plan:  1. Otitis externa of right ear - possibly TMJ but doubtful  - ciprofloxacin-dexamethasone (CIPRODEX) otic suspension; Apply 4 drops to the affected ear(s) twice daily x 7 days  Dispense: 7.5 mL; Refill: 0 - Follow up if no improvement in  next 2-3 days.

## 2014-08-26 NOTE — Patient Instructions (Addendum)
I have prescribed Ciprodex for your ear infection. Please use as directed. If you show no improvement in the next 2-3 days , please let me know.    Otitis Externa Otitis externa is a bacterial or fungal infection of the outer ear canal. This is the area from the eardrum to the outside of the ear. Otitis externa is sometimes called "swimmer's ear." CAUSES  Possible causes of infection include:  Swimming in dirty water.  Moisture remaining in the ear after swimming or bathing.  Mild injury (trauma) to the ear.  Objects stuck in the ear (foreign body).  Cuts or scrapes (abrasions) on the outside of the ear. SIGNS AND SYMPTOMS  The first symptom of infection is often itching in the ear canal. Later signs and symptoms may include swelling and redness of the ear canal, ear pain, and yellowish-white fluid (pus) coming from the ear. The ear pain may be worse when pulling on the earlobe. DIAGNOSIS  Your health care provider will perform a physical exam. A sample of fluid may be taken from the ear and examined for bacteria or fungi. TREATMENT  Antibiotic ear drops are often given for 10 to 14 days. Treatment may also include pain medicine or corticosteroids to reduce itching and swelling. HOME CARE INSTRUCTIONS   Apply antibiotic ear drops to the ear canal as prescribed by your health care provider.  Take medicines only as directed by your health care provider.  If you have diabetes, follow any additional treatment instructions from your health care provider.  Keep all follow-up visits as directed by your health care provider. PREVENTION   Keep your ear dry. Use the corner of a towel to absorb water out of the ear canal after swimming or bathing.  Avoid scratching or putting objects inside your ear. This can damage the ear canal or remove the protective wax that lines the canal. This makes it easier for bacteria and fungi to grow.  Avoid swimming in lakes, polluted water, or poorly  chlorinated pools.  You may use ear drops made of rubbing alcohol and vinegar after swimming. Combine equal parts of white vinegar and alcohol in a bottle. Put 3 or 4 drops into each ear after swimming. SEEK MEDICAL CARE IF:   You have a fever.  Your ear is still red, swollen, painful, or draining pus after 3 days.  Your redness, swelling, or pain gets worse.  You have a severe headache.  You have redness, swelling, pain, or tenderness in the area behind your ear. MAKE SURE YOU:   Understand these instructions.  Will watch your condition.  Will get help right away if you are not doing well or get worse. Document Released: 02/11/2005 Document Revised: 06/28/2013 Document Reviewed: 02/28/2011 [facility] Patient Information 2015 [place], [place]. This information is not intended to replace advice given to you by your health care provider. Make sure you discuss any questions you have with your health care provider.

## 2014-08-29 ENCOUNTER — Other Ambulatory Visit: Payer: Self-pay | Admitting: Internal Medicine

## 2014-08-30 ENCOUNTER — Other Ambulatory Visit: Payer: Self-pay | Admitting: Internal Medicine

## 2014-08-30 ENCOUNTER — Encounter: Payer: Self-pay | Admitting: Internal Medicine

## 2014-08-31 ENCOUNTER — Other Ambulatory Visit: Payer: Self-pay | Admitting: *Deleted

## 2014-08-31 MED ORDER — TRAMADOL HCL 50 MG PO TABS: 50.0000 mg | ORAL_TABLET | Freq: Three times a day (TID) | ORAL | Status: DC | PRN

## 2014-08-31 NOTE — Telephone Encounter (Signed)
Rx done and pt informed via Mychart message. 

## 2014-09-08 ENCOUNTER — Encounter: Payer: Self-pay | Admitting: Internal Medicine

## 2014-09-19 ENCOUNTER — Other Ambulatory Visit: Payer: Self-pay | Admitting: Internal Medicine

## 2014-09-21 ENCOUNTER — Other Ambulatory Visit: Payer: Self-pay | Admitting: Internal Medicine

## 2014-09-30 ENCOUNTER — Encounter: Payer: Self-pay | Admitting: Internal Medicine

## 2014-10-09 ENCOUNTER — Other Ambulatory Visit: Payer: Self-pay | Admitting: Internal Medicine

## 2014-10-10 ENCOUNTER — Other Ambulatory Visit: Payer: Self-pay | Admitting: Internal Medicine

## 2014-10-22 ENCOUNTER — Other Ambulatory Visit: Payer: Self-pay | Admitting: Internal Medicine

## 2014-11-14 ENCOUNTER — Telehealth: Payer: Self-pay | Admitting: Internal Medicine

## 2014-11-14 MED ORDER — ZOLPIDEM TARTRATE 10 MG PO TABS: 10.0000 mg | ORAL_TABLET | Freq: Every day | ORAL | Status: DC

## 2014-11-14 NOTE — Telephone Encounter (Signed)
Rx called in 

## 2014-11-14 NOTE — Telephone Encounter (Signed)
Left message on machine for patient

## 2014-11-14 NOTE — Telephone Encounter (Signed)
Pt is having surgery tomorrow and needs to go ahead and refill his Ambien. [facility] called for approval for the patient since he is unsure of when he will be able to get back to the pharmacy to pick up rx. Please call [facility] if OK to fill.

## 2014-11-14 NOTE — Telephone Encounter (Signed)
Ok to RF x 2 

## 2014-11-20 ENCOUNTER — Other Ambulatory Visit: Payer: Self-pay | Admitting: Internal Medicine

## 2014-12-05 ENCOUNTER — Other Ambulatory Visit: Payer: Self-pay | Admitting: *Deleted

## 2014-12-05 MED ORDER — PRAVASTATIN SODIUM 20 MG PO TABS: 20.0000 mg | ORAL_TABLET | Freq: Every day | ORAL | Status: DC

## 2014-12-05 MED ORDER — ESOMEPRAZOLE MAGNESIUM 40 MG PO CPDR: DELAYED_RELEASE_CAPSULE | ORAL | Status: DC

## 2014-12-13 ENCOUNTER — Encounter: Payer: Self-pay | Admitting: Adult Health

## 2014-12-13 ENCOUNTER — Ambulatory Visit (INDEPENDENT_AMBULATORY_CARE_PROVIDER_SITE_OTHER): Payer: Managed Care, Other (non HMO) | Admitting: Adult Health

## 2014-12-13 VITALS — BP 128/76 | Temp 98.7°F | Ht 72.0 in | Wt 235.0 lb

## 2014-12-13 DIAGNOSIS — R42 Dizziness and giddiness: Secondary | ICD-10-CM

## 2014-12-13 MED ORDER — MECLIZINE HCL 50 MG PO TABS: 50.0000 mg | ORAL_TABLET | Freq: Three times a day (TID) | ORAL | Status: DC | PRN

## 2014-12-13 NOTE — Progress Notes (Signed)
Pre visit review using our clinic review tool, if applicable. No additional management support is needed unless otherwise documented below in the visit note. 

## 2014-12-13 NOTE — Patient Instructions (Addendum)
It was great seeing you again!  Your exam appears to be consistent with vertigo.   Someone from physical therapy will call you to schedule an appointment  Use the Antivert as needed for dizziness  You can also try OTC Dramamine.   Follow up with me if no improvement.   Benign Positional Vertigo Vertigo is the feeling that you or your surroundings are moving when they are not. Benign positional vertigo is the most common form of vertigo. The cause of this condition is not serious (is benign). This condition is triggered by certain movements and positions (is positional). This condition can be dangerous if it occurs while you are doing something that could endanger you or others, such as driving.  CAUSES In many cases, the cause of this condition is not known. It may be caused by a disturbance in an area of the inner ear that helps your brain to sense movement and balance. This disturbance can be caused by a viral infection (labyrinthitis), head injury, or repetitive motion. RISK FACTORS This condition is more likely to develop in:  Women.  People who are 109 years of age or older. SYMPTOMS Symptoms of this condition usually happen when you move your head or your eyes in different directions. Symptoms may start suddenly, and they usually last for less than a minute. Symptoms may include:  Loss of balance and falling.  Feeling like you are spinning or moving.  Feeling like your surroundings are spinning or moving.  Nausea and vomiting.  Blurred vision.  Dizziness.  Involuntary eye movement (nystagmus). Symptoms can be mild and cause only slight annoyance, or they can be severe and interfere with daily life. Episodes of benign positional vertigo may return (recur) over time, and they may be triggered by certain movements. Symptoms may improve over time. DIAGNOSIS This condition is usually diagnosed by medical history and a physical exam of the head, neck, and ears. You may be  referred to a health care provider who specializes in ear, nose, and throat (ENT) problems (otolaryngologist) or a provider who specializes in disorders of the nervous system (neurologist). You may have additional testing, including:  MRI.  A CT scan.  Eye movement tests. Your health care provider may ask you to change positions quickly while he or she watches you for symptoms of benign positional vertigo, such as nystagmus. Eye movement may be tested with an electronystagmogram (ENG), caloric stimulation, the Dix-Hallpike test, or the roll test.  An electroencephalogram (EEG). This records electrical activity in your brain.  Hearing tests. TREATMENT Usually, your health care provider will treat this by moving your head in specific positions to adjust your inner ear back to normal. Surgery may be needed in severe cases, but this is rare. In some cases, benign positional vertigo may resolve on its own in 2-4 weeks. HOME CARE INSTRUCTIONS Safety  Move slowly.Avoid sudden body or head movements.  Avoid driving.  Avoid operating heavy machinery.  Avoid doing any tasks that would be dangerous to you or others if a vertigo episode would occur.  If you have trouble walking or keeping your balance, try using a cane for stability. If you feel dizzy or unstable, sit down right away.  Return to your normal activities as told by your health care provider. Ask your health care provider what activities are safe for you. General Instructions  Take over-the-counter and prescription medicines only as told by your health care provider.  Avoid certain positions or movements as told by your  health care provider.  Drink enough fluid to keep your urine clear or pale yellow.  Keep all follow-up visits as told by your health care provider. This is important. SEEK MEDICAL CARE IF:  You have a fever.  Your condition gets worse or you develop new symptoms.  Your family or friends notice any  behavioral changes.  Your nausea or vomiting gets worse.  You have numbness or a "pins and needles" sensation. SEEK IMMEDIATE MEDICAL CARE IF:  You have difficulty speaking or moving.  You are always dizzy.  You faint.  You develop severe headaches.  You have weakness in your legs or arms.  You have changes in your hearing or vision.  You develop a stiff neck.  You develop sensitivity to light.   This information is not intended to replace advice given to you by your health care provider. Make sure you discuss any questions you have with your health care provider.   Document Released: 11/19/2005 Document Revised: 11/02/2014 Document Reviewed: 06/06/2014 Elsevier Interactive Patient Education [employer].

## 2014-12-13 NOTE — Progress Notes (Signed)
Subjective:    Patient ID: Dylan [name], male    DOB: [birth year], 64 y.o.   MRN: [id number]  HPI  64 year old male who presents to the office today for an acute issue of lightheadedness. This has been an ongoing issue for about a week. He feels like " I am having an out of body experience". This sensation comes throughout the day, there is no predisposing factors that influence his lightheadedness. It usually happens " when I am doing something normal". The sensation stops when he "becomes stable", like sitting and laying down"  He has noticed that when he has been walking up stairs that he trips over his feet.   The only thing that has changed has been that his wife was told that she does not have long to live due to cancer. She spent the month of September at [facility] for treatments.   Denies any syncopal episodes. Is staying hydrated and eating well. No nausea and is not interfering with ADL's.   No change in medications   Review of Systems  Constitutional: Positive for chills. Negative for fever.  Eyes: Negative.   Respiratory: Negative.   Musculoskeletal: Positive for back pain (chronic), joint swelling and neck pain (chronic). Negative for gait problem.  Neurological: Positive for light-headedness and numbness (chronic numbness in the finger tips). Negative for dizziness, tremors, seizures, syncope, facial asymmetry, speech difficulty, weakness and headaches.  All other systems reviewed and are negative.  Past Medical History  Diagnosis Date  . Allergy     allergic rhinitis  . Hyperlipidemia   . History of ulcerative colitis     non recent flares  . History of melanoma 15 yrs ago    face and forehead-- previously followed by Derm in [place]  . Diabetes mellitus   . Hypertension   . Sleep apnea     last sleep study 10 yrs ago,CPAP  . Complication of anesthesia     trouble waking up with succycholine, does not have enzyme to break down succycholine  . Hepatitis age 27   history of infections Hepatitis type a  . Headache(784.0)   . Cancer [facility])     melonoma    Social History   Social History  . Marital Status: Married    Spouse Name: N/A  . Number of Children: N/A  . Years of Education: N/A   Occupational History  . Not on file.   Social History Main Topics  . Smoking status: Never Smoker   . Smokeless tobacco: Former Systems developer    Quit date: 02/26/2003  . Alcohol Use: No  . Drug Use: No  . Sexual Activity: Not on file   Other Topics Concern  . Not on file   Social History Narrative   Wife works at cath lab at [employer] advanced breast cancer    Past Surgical History  Procedure Laterality Date  . Tonsillectomy  1969  . Knee surgery  yrs ago    right knee  . Anterior cervical decomp/discectomy fusion  10/15/2011    Procedure: ANTERIOR CERVICAL DECOMPRESSION/DISCECTOMY FUSION 1 LEVEL;  Surgeon: [name], MD;  Location: [place] [facility];  Service: Neurosurgery;  Laterality: N/A;  Cervical four-five Anterior cervical decompression/diskectomy/fusion  . Acl and mensicus repair reconstruct Right 1980's  . Knee arthroscopy Left last done oct 2013    x 3  . Appendectomy  1969  . Total knee arthroplasty Left 10/22/2012    Procedure: TOTAL KNEE ARTHROPLASTY;  Surgeon: [name],  MD;  Location: [facility];  Service: Orthopedics;  Laterality: Left;    Family History  Problem Relation Age of Onset  . Cancer Brother     brain tumor  . Diabetes Other   . Cancer Other     ovarian    Allergies  Allergen Reactions  . Vancomycin Hives    Ran off a pump. So developed redman's . Running too fast  . Heparin     REACTION: breathing problems  . Penicillins     REACTION: Rash  . Succinylcholine Chloride     REACTION: does not wake up from anesthetic    Current Outpatient Prescriptions on File Prior to Visit  Medication Sig Dispense Refill  . ALPRAZolam (XANAX) 0.25 MG tablet TAKE 1 TABLET BY MOUTH TWICE A DAY AS NEEDED FOR ANXIETY 30 tablet 2  .  amitriptyline (ELAVIL) 25 MG tablet Take by mouth at bedtime.  3  . amLODipine (NORVASC) 5 MG tablet TAKE 1 TABLET BY MOUTH DAILY 90 tablet 1  . aspirin EC 81 MG tablet Take 1 tablet (81 mg total) by mouth daily.    . citalopram (CELEXA) 10 MG tablet TAKE 1 TABLET BY MOUTH EVERY DAY 90 tablet 1  . citalopram (CELEXA) 20 MG tablet TAKE 1 TABLET (20 MG TOTAL) BY MOUTH DAILY. 90 tablet 1  . co-enzyme Q-10 30 MG capsule Take 30 mg by mouth daily.    Marland Kitchen esomeprazole (NEXIUM) 40 MG capsule TAKE 1 CAPSULE (40 MG TOTAL) BY MOUTH DAILY BEFORE BREAKFAST. 30 capsule 3  . esomeprazole (NEXIUM) 40 MG capsule TAKE 1 CAPSULE (40 MG TOTAL) BY MOUTH DAILY BEFORE BREAKFAST. 90 capsule 0  . finasteride (PROSCAR) 5 MG tablet TAKE 1 TABLET (5 MG TOTAL) BY MOUTH DAILY. 90 tablet 0  . Melatonin 5 MG TABS Take by mouth.    . metFORMIN (GLUCOPHAGE) 500 MG tablet TAKE 1 TABLET BY MOUTH TWICE A DAY WITH A MEAL 180 tablet 1  . ondansetron (ZOFRAN) 4 MG tablet Take 1 tablet (4 mg total) by mouth 2 (two) times daily as needed for nausea. 30 tablet 0  . pravastatin (PRAVACHOL) 20 MG tablet Take 1 tablet (20 mg total) by mouth daily. 90 tablet 0  . tamsulosin (FLOMAX) 0.4 MG CAPS capsule Take 1 capsule (0.4 mg total) by mouth daily. 90 capsule 3  . traMADol (ULTRAM) 50 MG tablet Take 1 tablet (50 mg total) by mouth every 8 (eight) hours as needed. 30 tablet 0  . valsartan (DIOVAN) 160 MG tablet TAKE 1 TABLET BY MOUTH EVERY DAY 90 tablet 1  . zolpidem (AMBIEN) 10 MG tablet Take 1 tablet (10 mg total) by mouth at bedtime. 30 tablet 2   No current facility-administered medications on file prior to visit.    BP 128/76 mmHg  Temp(Src) 98.7 F (37.1 C) (Oral)  Ht 6' (1.829 m)  Wt 235 lb (106.595 kg)  BMI 31.86 kg/m2       Objective:   Physical Exam  Constitutional: He is oriented to person, place, and time. He appears well-developed and well-nourished. No distress.  HENT:  Head: Normocephalic and atraumatic.  Right  Ear: External ear normal.  Left Ear: External ear normal.  Nose: Nose normal.  Mouth/Throat: Oropharynx is clear and moist. No oropharyngeal exudate.  TM's visualized. No signs of infection. No cerumen impaction  Eyes: Conjunctivae and EOM are normal. Pupils are equal, round, and reactive to light. Right eye exhibits no discharge. Left eye exhibits no discharge. No scleral  icterus.  Neck: Normal range of motion. Neck supple.  Cardiovascular: Normal rate, regular rhythm, normal heart sounds and intact distal pulses.  Exam reveals no gallop and no friction rub.   No murmur heard. Pulmonary/Chest: Effort normal and breath sounds normal. No respiratory distress. He has no wheezes. He has no rales. He exhibits no tenderness.  Musculoskeletal: Normal range of motion. He exhibits no edema or tenderness.  Lymphadenopathy:    He has no cervical adenopathy.  Neurological: He is alert and oriented to person, place, and time. He has normal reflexes. He displays normal reflexes. No cranial nerve deficit. He exhibits normal muscle tone. Coordination normal.  + nystagmus Complaints of lightheaded when positioned supine, which resolved quickly. When coming back to a seated position he became lightheaded once again, this also resolved quickly.   Complaints of dizziness when moving head left to right  Skin: Skin is warm and dry. No rash noted. He is not diaphoretic. No erythema. No pallor.  Psychiatric: He has a normal mood and affect. His behavior is normal. Judgment and thought content normal.  Nursing note and vitals reviewed.      Assessment & Plan:  1. Positional lightheadedness - Exam reveals likely vestibular cause for his sensation of lightheadedness. Other causes labyrinthitis, Meniere Disease. Unlikely tumor but cannot be ruled out at this time.  - PT vestibular rehab; Future - meclizine (ANTIVERT) 50 MG tablet; Take 1 tablet (50 mg total) by mouth 3 (three) times daily as needed for dizziness.   Dispense: 30 tablet; Refill: 3 - Consider MRI of brain if no improvement.

## 2014-12-20 ENCOUNTER — Encounter: Payer: Self-pay | Admitting: Adult Health

## 2015-01-04 ENCOUNTER — Other Ambulatory Visit: Payer: Self-pay | Admitting: Internal Medicine

## 2015-01-05 ENCOUNTER — Encounter: Payer: Self-pay | Admitting: Adult Health

## 2015-01-06 ENCOUNTER — Other Ambulatory Visit: Payer: Self-pay | Admitting: Internal Medicine

## 2015-01-06 MED ORDER — ALPRAZOLAM 0.25 MG PO TABS: ORAL_TABLET | ORAL | Status: DC

## 2015-01-06 NOTE — Telephone Encounter (Signed)
Rx called in to pharmacy. 

## 2015-01-30 ENCOUNTER — Ambulatory Visit: Payer: Managed Care, Other (non HMO) | Admitting: Family Medicine

## 2015-01-30 ENCOUNTER — Ambulatory Visit (INDEPENDENT_AMBULATORY_CARE_PROVIDER_SITE_OTHER): Payer: Managed Care, Other (non HMO) | Admitting: Family Medicine

## 2015-01-30 ENCOUNTER — Encounter: Payer: Self-pay | Admitting: Family Medicine

## 2015-01-30 VITALS — BP 137/91 | HR 98 | Temp 98.5°F | Resp 16 | Ht 72.0 in | Wt 235.0 lb

## 2015-01-30 DIAGNOSIS — E8881 Metabolic syndrome: Secondary | ICD-10-CM | POA: Diagnosis not present

## 2015-01-30 DIAGNOSIS — R42 Dizziness and giddiness: Secondary | ICD-10-CM | POA: Diagnosis not present

## 2015-01-30 LAB — CBC WITH DIFFERENTIAL/PLATELET
BASOS PCT: 1 % (ref 0–1)
Basophils Absolute: 0.1 10*3/uL (ref 0.0–0.1)
Eosinophils Absolute: 0.2 10*3/uL (ref 0.0–0.7)
Eosinophils Relative: 3 % (ref 0–5)
HEMATOCRIT: 41.2 % (ref 39.0–52.0)
HEMOGLOBIN: 13.8 g/dL (ref 13.0–17.0)
LYMPHS PCT: 44 % (ref 12–46)
Lymphs Abs: 3.3 10*3/uL (ref 0.7–4.0)
MCH: 29.1 pg (ref 26.0–34.0)
MCHC: 33.5 g/dL (ref 30.0–36.0)
MCV: 86.9 fL (ref 78.0–100.0)
MONO ABS: 0.6 10*3/uL (ref 0.1–1.0)
MPV: 9.7 fL (ref 8.6–12.4)
Monocytes Relative: 8 % (ref 3–12)
Neutro Abs: 3.3 10*3/uL (ref 1.7–7.7)
Neutrophils Relative %: 44 % (ref 43–77)
Platelets: 255 10*3/uL (ref 150–400)
RBC: 4.74 MIL/uL (ref 4.22–5.81)
RDW: 14.3 % (ref 11.5–15.5)
WBC: 7.4 10*3/uL (ref 4.0–10.5)

## 2015-01-30 LAB — COMPREHENSIVE METABOLIC PANEL
ALBUMIN: 4.3 g/dL (ref 3.6–5.1)
ALK PHOS: 57 U/L (ref 40–115)
ALT: 29 U/L (ref 9–46)
AST: 15 U/L (ref 10–35)
BILIRUBIN TOTAL: 0.3 mg/dL (ref 0.2–1.2)
BUN: 20 mg/dL (ref 7–25)
CALCIUM: 9 mg/dL (ref 8.6–10.3)
CO2: 27 mmol/L (ref 20–31)
CREATININE: 0.89 mg/dL (ref 0.70–1.25)
Chloride: 103 mmol/L (ref 98–110)
Glucose, Bld: 89 mg/dL (ref 65–99)
Potassium: 4.5 mmol/L (ref 3.5–5.3)
SODIUM: 138 mmol/L (ref 135–146)
TOTAL PROTEIN: 6.7 g/dL (ref 6.1–8.1)

## 2015-01-30 LAB — TSH: TSH: 1.691 u[IU]/mL (ref 0.350–4.500)

## 2015-01-30 LAB — HEMOGLOBIN A1C
Hgb A1c MFr Bld: 6.1 % — ABNORMAL HIGH (ref <5.7)
Mean Plasma Glucose: 128 mg/dL — ABNORMAL HIGH (ref <117)

## 2015-01-30 NOTE — Progress Notes (Signed)
Pre visit review using our clinic review tool, if applicable. No additional management support is needed unless otherwise documented below in the visit note. 

## 2015-01-30 NOTE — Progress Notes (Signed)
OFFICE VISIT  01/30/2015   CC:  Chief Complaint  Patient presents with  . Fatigue    x 4 days  . Dizziness    x 4 days    HPI:    Patient is a 64 y.o. Caucasian male who I am seeing for the first time today, presents accompanied by his wife for "dizziness" and feeling tired.  Onset about 6 days ago.  No spinning sensation.  Gets very lightheaded upon standing up but also feels it at rest while sitting or standing.  Lasts minutes while most intense but vague sense of disequilibrium for most of the day.  No syncope.  Denies palpitations/heart racing.  Occ mild nauseated feeling with vomiting a couple times.  Chronic ringing in ears feels worse lately, hearing worse lately but not in any one particularly ear. Thought it may be due to his pain med but stopping this med was no help.  Similar episode within the last month or two that spontaneously resolved.   Doesn't seem connected to any particular med although nortriptyline is the newest med he has had added--for chronic LE neuropathic pain.  He stopped this med last night.  He has been taking meclizine and he feels like this helps settle it down some.   He feels like he is not walking straight a lot of the time when he walks lately. Says vision mildly blurry, "getting worse".  No double vision or visual field loss.  ROS: sometimes hands feel weak and heavy when typing.  No HA's. Stays well hydrated--water. No home bp monitoring except this morning x 1 and it was normal.     Past Medical History  Diagnosis Date  . Allergy     allergic rhinitis  . Hyperlipidemia   . History of ulcerative colitis     non recent flares  . History of melanoma 15 yrs ago    face and forehead-- previously followed by Derm in [place]  . Diabetes mellitus   . Hypertension   . Sleep apnea     last sleep study 10 yrs ago,CPAP  . Complication of anesthesia     trouble waking up with succycholine, does not have enzyme to break down succycholine  .  Hepatitis age 49    history of infections Hepatitis type a  . Headache(784.0)   . Cancer ([place])     melonoma    Past Surgical History  Procedure Laterality Date  . Tonsillectomy  1969  . Knee surgery  yrs ago    right knee  . Anterior cervical decomp/discectomy fusion  10/15/2011    Procedure: ANTERIOR CERVICAL DECOMPRESSION/DISCECTOMY FUSION 1 LEVEL;  Surgeon: [name], MD;  Location: [facility];  Service: Neurosurgery;  Laterality: N/A;  Cervical four-five Anterior cervical decompression/diskectomy/fusion  . Acl and mensicus repair reconstruct Right 1980's  . Knee arthroscopy Left last done oct 2013    x 3  . Appendectomy  1969  . 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 ALPRAZolam (XANAX) 0.25 MG tablet TAKE 1 TABLET BY MOUTH TWICE A DAY AS NEEDED FOR ANXIETY 60 tablet 0  . amLODipine (NORVASC) 5 MG tablet TAKE 1 TABLET BY MOUTH DAILY 90 tablet 1  . aspirin EC 81 MG tablet Take 1 tablet (81 mg total) by mouth daily.    Marland Kitchen  citalopram (CELEXA) 20 MG tablet TAKE 1 TABLET (20 MG TOTAL) BY MOUTH DAILY. 90 tablet 1  . co-enzyme Q-10 30 MG capsule Take 30 mg by mouth daily.    Marland Kitchen esomeprazole (NEXIUM) 40 MG capsule TAKE 1 CAPSULE (40 MG TOTAL) BY MOUTH DAILY BEFORE BREAKFAST. 30 capsule 3  . finasteride (PROSCAR) 5 MG tablet TAKE 1 TABLET (5 MG TOTAL) BY MOUTH DAILY. 90 tablet 0  . meclizine (ANTIVERT) 50 MG tablet Take 1 tablet (50 mg total) by mouth 3 (three) times daily as needed for dizziness. 30 tablet 3  . Melatonin 5 MG TABS Take by mouth.    . metFORMIN (GLUCOPHAGE) 500 MG tablet TAKE 1 TABLET BY MOUTH TWICE A DAY WITH A MEAL 180 tablet 1  . ondansetron (ZOFRAN) 4 MG tablet Take 1 tablet (4 mg total) by mouth 2 (two) times daily as needed for nausea. 30 tablet 0  .  pravastatin (PRAVACHOL) 20 MG tablet Take 1 tablet (20 mg total) by mouth daily. 90 tablet 0  . tamsulosin (FLOMAX) 0.4 MG CAPS capsule Take 1 capsule (0.4 mg total) by mouth daily. 90 capsule 3  . valsartan (DIOVAN) 160 MG tablet TAKE 1 TABLET BY MOUTH EVERY DAY 90 tablet 1  . zolpidem (AMBIEN) 10 MG tablet Take 1 tablet (10 mg total) by mouth at bedtime. 30 tablet 2  . amitriptyline (ELAVIL) 25 MG tablet Take by mouth at bedtime.  3  . citalopram (CELEXA) 10 MG tablet TAKE 1 TABLET BY MOUTH EVERY DAY (Patient not taking: Reported on 01/30/2015) 90 tablet 1  . esomeprazole (NEXIUM) 40 MG capsule TAKE 1 CAPSULE (40 MG TOTAL) BY MOUTH DAILY BEFORE BREAKFAST. (Patient not taking: Reported on 01/30/2015) 90 capsule 0  . traMADol (ULTRAM) 50 MG tablet Take 1 tablet (50 mg total) by mouth every 8 (eight) hours as needed. (Patient not taking: Reported on 01/30/2015) 30 tablet 0   No facility-administered medications prior to visit.    Allergies  Allergen Reactions  . Vancomycin Hives    Ran off a pump. So developed redman's . Running too fast  . Heparin     REACTION: breathing problems  . Penicillins     REACTION: Rash  . Succinylcholine Chloride     REACTION: does not wake up from anesthetic    ROS As per HPI  PE: Blood pressure 137/91, pulse 98, temperature 98.5 F (36.9 C), temperature source Oral, resp. rate 16, height 6' (1.829 m), weight 235 lb (106.595 kg), SpO2 99 %. Gen: Alert, well appearing.  Patient is oriented to person, place, time, and situation. ENT: Ears: EACs clear, normal epithelium.  TMs with good light reflex and landmarks bilaterally.  Eyes: no injection, icteris, swelling, or exudate.  EOMI, PERRLA. Nose: no drainage or turbinate edema/swelling.  No injection or focal lesion.  Mouth: lips without lesion/swelling.  Oral mucosa pink and moist.  Dentition intact and without obvious caries or gingival swelling.  Oropharynx without erythema, exudate, or swelling.  Neck:  supple/nontender.  No LAD, mass, or TM.  Carotid pulses 2+ bilaterally, without bruits. CV: RRR, no m/r/g.   LUNGS: CTA bilat, nonlabored resps, good aeration in all lung fields. ABD: soft, NT/ND EXT: no clubbing, cyanosis, or edema.  Neuro: CN 2-12 intact bilaterally, strength 5/5 in proximal and distal upper extremities and lower extremities bilaterally. No tremor.   No ataxia but walks w decreased arm swing .  Upper extremity and lower extremity DTRs symmetric--trace in all areas.  No pronator drift.  No abnormalities on visual field testing.   LABS:  Lab Results  Component Value Date   WBC 8.8 06/03/2014   HGB 14.3 06/03/2014   HCT 41.5 06/03/2014   MCV 87.4 06/03/2014   PLT 280 06/03/2014   Lab Results  Component Value Date   TSH 0.78 02/15/2014     Chemistry      Component Value Date/Time   NA 136 06/03/2014 1628   K 4.1 06/03/2014 1628   CL 100 06/03/2014 1628   CO2 28 06/03/2014 1628   BUN 16 06/03/2014 1628   CREATININE 0.81 06/03/2014 1628   CREATININE 0.8 02/15/2014 1026      Component Value Date/Time   CALCIUM 9.6 06/03/2014 1628   ALKPHOS 56 06/03/2014 1628   AST 17 06/03/2014 1628   ALT 24 06/03/2014 1628   BILITOT 0.4 06/03/2014 1628     Lab Results  Component Value Date   HGBA1C 6.5 02/15/2014   12 lead EKG today: sinus rhythm, incomplete RBBB and left axis-anterior fascicular block--no change compared to EKG 04/2014.  IMPRESSION AND PLAN:  Disequilibrium syndrome, question of ataxia and more definite sense of worsening of chronic tinnitus and hearing impairment.  Will check for metabolic abnormalities firs: CBC, CMET, TSH, as well as recheck A1c today--hx of insulin resistance and has been on metformin but no home gluc monitoring being done. If all labs neg, will check MRI brain w and w/out contrast to further eval for possible cerebellar CVA or cerebellopontine angle tumor. Less suspicion that this is cardiac in nature, but will consider holter  monitor if all w/u nl, esp since he has some conduction disturbance on EKG (stable).  An After Visit Summary was printed and given to the patient.   FOLLOW UP: Return for f/u to be determined based on results of w/u.

## 2015-02-01 ENCOUNTER — Other Ambulatory Visit: Payer: Self-pay | Admitting: Family Medicine

## 2015-02-01 DIAGNOSIS — R42 Dizziness and giddiness: Secondary | ICD-10-CM

## 2015-02-01 DIAGNOSIS — H9190 Unspecified hearing loss, unspecified ear: Secondary | ICD-10-CM

## 2015-02-01 DIAGNOSIS — H9319 Tinnitus, unspecified ear: Secondary | ICD-10-CM

## 2015-02-01 DIAGNOSIS — R270 Ataxia, unspecified: Secondary | ICD-10-CM

## 2015-02-04 ENCOUNTER — Ambulatory Visit (HOSPITAL_BASED_OUTPATIENT_CLINIC_OR_DEPARTMENT_OTHER)
Admission: RE | Admit: 2015-02-04 | Discharge: 2015-02-04 | Disposition: A | Discharge status: Home / Self Care | Payer: Managed Care, Other (non HMO) | Source: Ambulatory Visit | Attending: Family Medicine | Admitting: Family Medicine

## 2015-02-04 DIAGNOSIS — H9190 Unspecified hearing loss, unspecified ear: Secondary | ICD-10-CM | POA: Diagnosis not present

## 2015-02-04 DIAGNOSIS — H9319 Tinnitus, unspecified ear: Secondary | ICD-10-CM

## 2015-02-04 DIAGNOSIS — H9313 Tinnitus, bilateral: Secondary | ICD-10-CM | POA: Insufficient documentation

## 2015-02-04 DIAGNOSIS — R42 Dizziness and giddiness: Secondary | ICD-10-CM | POA: Diagnosis not present

## 2015-02-04 DIAGNOSIS — R270 Ataxia, unspecified: Secondary | ICD-10-CM | POA: Diagnosis not present

## 2015-02-04 DIAGNOSIS — J328 Other chronic sinusitis: Secondary | ICD-10-CM | POA: Insufficient documentation

## 2015-02-04 DIAGNOSIS — R410 Disorientation, unspecified: Secondary | ICD-10-CM | POA: Insufficient documentation

## 2015-02-04 HISTORY — DX: Dizziness and giddiness: R42

## 2015-02-04 MED ORDER — GADOBENATE DIMEGLUMINE 529 MG/ML IV SOLN: 20.0000 mL | Freq: Once | INTRAVENOUS | Status: DC | PRN

## 2015-02-05 ENCOUNTER — Other Ambulatory Visit: Payer: Self-pay | Admitting: Internal Medicine

## 2015-02-06 ENCOUNTER — Encounter (HOSPITAL_BASED_OUTPATIENT_CLINIC_OR_DEPARTMENT_OTHER): Payer: Self-pay

## 2015-02-21 LAB — HM DIABETES EYE EXAM

## 2015-02-24 ENCOUNTER — Encounter: Payer: Self-pay | Admitting: Internal Medicine

## 2015-03-01 ENCOUNTER — Encounter: Payer: Self-pay | Admitting: Internal Medicine

## 2015-03-04 ENCOUNTER — Other Ambulatory Visit: Payer: Self-pay | Admitting: Internal Medicine

## 2015-03-18 ENCOUNTER — Other Ambulatory Visit: Payer: Self-pay | Admitting: Internal Medicine

## 2015-03-19 ENCOUNTER — Other Ambulatory Visit: Payer: Self-pay | Admitting: Internal Medicine

## 2015-03-20 MED ORDER — FINASTERIDE 5 MG PO TABS: 5.0000 mg | ORAL_TABLET | Freq: Every day | ORAL | Status: DC

## 2015-03-20 NOTE — Addendum Note (Signed)
Addended by: Westley Hummer B on: 03/20/2015 08:52 AM   Modules accepted: Orders

## 2015-03-27 ENCOUNTER — Other Ambulatory Visit: Payer: Self-pay | Admitting: Adult Health

## 2015-03-28 ENCOUNTER — Encounter: Payer: Self-pay | Admitting: Internal Medicine

## 2015-03-29 MED ORDER — ALPRAZOLAM 0.25 MG PO TABS: ORAL_TABLET | ORAL | Status: DC

## 2015-04-01 ENCOUNTER — Other Ambulatory Visit: Payer: Self-pay | Admitting: Internal Medicine

## 2015-04-02 ENCOUNTER — Other Ambulatory Visit: Payer: Self-pay | Admitting: Internal Medicine

## 2015-04-04 ENCOUNTER — Other Ambulatory Visit: Payer: Self-pay | Admitting: Internal Medicine

## 2015-04-06 ENCOUNTER — Encounter: Payer: Self-pay | Admitting: Internal Medicine

## 2015-04-07 ENCOUNTER — Encounter: Payer: Self-pay | Admitting: Internal Medicine

## 2015-04-07 MED ORDER — ZOLPIDEM TARTRATE 10 MG PO TABS: 10.0000 mg | ORAL_TABLET | Freq: Every day | ORAL | Status: DC

## 2015-04-26 ENCOUNTER — Other Ambulatory Visit: Payer: Self-pay | Admitting: Internal Medicine

## 2015-04-26 ENCOUNTER — Encounter: Payer: Self-pay | Admitting: Internal Medicine

## 2015-04-27 ENCOUNTER — Other Ambulatory Visit: Payer: Self-pay | Admitting: *Deleted

## 2015-04-27 MED ORDER — ALPRAZOLAM 0.25 MG PO TABS: ORAL_TABLET | ORAL | Status: DC

## 2015-04-27 NOTE — Telephone Encounter (Signed)
Rx done and the pt was informed via Mychart message. 

## 2015-05-11 ENCOUNTER — Other Ambulatory Visit: Payer: Self-pay | Admitting: Family Medicine

## 2015-05-11 ENCOUNTER — Encounter: Payer: Self-pay | Admitting: Internal Medicine

## 2015-05-11 ENCOUNTER — Other Ambulatory Visit: Payer: Self-pay | Admitting: Internal Medicine

## 2015-05-12 ENCOUNTER — Encounter: Payer: Self-pay | Admitting: Internal Medicine

## 2015-05-12 MED ORDER — FINASTERIDE 5 MG PO TABS: 5.0000 mg | ORAL_TABLET | Freq: Every day | ORAL | Status: DC

## 2015-05-20 ENCOUNTER — Other Ambulatory Visit: Payer: Self-pay | Admitting: Internal Medicine

## 2015-06-08 ENCOUNTER — Encounter: Payer: Self-pay | Admitting: Family Medicine

## 2015-06-08 ENCOUNTER — Emergency Department (HOSPITAL_COMMUNITY)
Admission: EM | Admit: 2015-06-08 | Discharge: 2015-06-08 | Disposition: A | Discharge status: Home / Self Care | Payer: Managed Care, Other (non HMO) | Attending: Emergency Medicine | Admitting: Emergency Medicine

## 2015-06-08 ENCOUNTER — Ambulatory Visit (INDEPENDENT_AMBULATORY_CARE_PROVIDER_SITE_OTHER): Payer: Managed Care, Other (non HMO) | Admitting: Family Medicine

## 2015-06-08 ENCOUNTER — Encounter (HOSPITAL_COMMUNITY): Payer: Self-pay | Admitting: *Deleted

## 2015-06-08 ENCOUNTER — Emergency Department (HOSPITAL_COMMUNITY): Payer: Managed Care, Other (non HMO)

## 2015-06-08 VITALS — BP 122/70 | HR 96 | Temp 98.0°F | Wt 228.8 lb

## 2015-06-08 DIAGNOSIS — Z85820 Personal history of malignant melanoma of skin: Secondary | ICD-10-CM | POA: Diagnosis not present

## 2015-06-08 DIAGNOSIS — R05 Cough: Secondary | ICD-10-CM

## 2015-06-08 DIAGNOSIS — Z7982 Long term (current) use of aspirin: Secondary | ICD-10-CM | POA: Insufficient documentation

## 2015-06-08 DIAGNOSIS — H9202 Otalgia, left ear: Secondary | ICD-10-CM | POA: Diagnosis not present

## 2015-06-08 DIAGNOSIS — J019 Acute sinusitis, unspecified: Secondary | ICD-10-CM | POA: Insufficient documentation

## 2015-06-08 DIAGNOSIS — J324 Chronic pansinusitis: Secondary | ICD-10-CM

## 2015-06-08 DIAGNOSIS — E119 Type 2 diabetes mellitus without complications: Secondary | ICD-10-CM | POA: Insufficient documentation

## 2015-06-08 DIAGNOSIS — R51 Headache: Secondary | ICD-10-CM | POA: Diagnosis present

## 2015-06-08 DIAGNOSIS — Z7984 Long term (current) use of oral hypoglycemic drugs: Secondary | ICD-10-CM | POA: Diagnosis not present

## 2015-06-08 DIAGNOSIS — J302 Other seasonal allergic rhinitis: Secondary | ICD-10-CM | POA: Diagnosis not present

## 2015-06-08 DIAGNOSIS — G4733 Obstructive sleep apnea (adult) (pediatric): Secondary | ICD-10-CM | POA: Insufficient documentation

## 2015-06-08 DIAGNOSIS — I10 Essential (primary) hypertension: Secondary | ICD-10-CM | POA: Insufficient documentation

## 2015-06-08 DIAGNOSIS — J189 Pneumonia, unspecified organism: Secondary | ICD-10-CM

## 2015-06-08 DIAGNOSIS — E785 Hyperlipidemia, unspecified: Secondary | ICD-10-CM | POA: Diagnosis not present

## 2015-06-08 DIAGNOSIS — Z8719 Personal history of other diseases of the digestive system: Secondary | ICD-10-CM | POA: Insufficient documentation

## 2015-06-08 DIAGNOSIS — Z79899 Other long term (current) drug therapy: Secondary | ICD-10-CM | POA: Insufficient documentation

## 2015-06-08 DIAGNOSIS — Z880 Allergy status to penicillin: Secondary | ICD-10-CM | POA: Insufficient documentation

## 2015-06-08 DIAGNOSIS — J159 Unspecified bacterial pneumonia: Secondary | ICD-10-CM | POA: Insufficient documentation

## 2015-06-08 DIAGNOSIS — R059 Cough, unspecified: Secondary | ICD-10-CM

## 2015-06-08 MED ORDER — OXYCODONE-ACETAMINOPHEN 5-325 MG PO TABS
2.0000 | ORAL_TABLET | Freq: Once | ORAL | Status: AC
  Administered 2015-06-08: 2 via ORAL
  Filled 2015-06-08: qty 2

## 2015-06-08 MED ORDER — DOXYCYCLINE HYCLATE 100 MG PO TABS: 100.0000 mg | ORAL_TABLET | Freq: Two times a day (BID) | ORAL | Status: DC

## 2015-06-08 NOTE — ED Provider Notes (Signed)
CSN: [id number]     Arrival date & time 06/08/15  1614 History   By signing my name below, I, Nicole Kindred, attest that this documentation has been prepared under the direction and in the presence of [place]. Anabell Swint, [name].  Electronically Signed: Nicole Kindred, ED Scribe 06/08/2015 at 6:51 PM.  Chief Complaint  Patient presents with  . Facial Pain  . Fatigue    The history is provided by the patient. No language interpreter was used.    HPI Comments: ZEVION SONG is a 65 y.o. male with PMHx of allergic rhinitis who presents to the Emergency Department complaining of gradual onset, constant left ear pain, ongoing for three days. Symptoms began with a sore throat which has since subsided. Pt reports associated headache, nasal congestion, sinus pressure, chills, productive cough with yellow phlegm, fatigue, and subjective fever. Pt was seen at [facility] yesterday and received azithromycin. He was seen at his PCP today where he was switched to doxycycline. His family felt he was continuing to act tired after taking one dose, prompting them to bring him to the ED. No other associated symptoms noted. His headache and sinus pain is worsened with palpation. No other worsening or alleviating factors noted. Pt denies any other pertinent symptoms.    Past Medical History  Diagnosis Date  . Allergy     allergic rhinitis  . Hyperlipidemia   . History of ulcerative colitis     non recent flares  . History of melanoma 15 yrs ago    face and forehead-- previously followed by Derm in [place]  . Diabetes mellitus   . Hypertension   . Sleep apnea     last sleep study 10 yrs ago,CPAP  . Complication of anesthesia     trouble waking up with succycholine, does not have enzyme to break down succycholine  . Hepatitis age 64    history of infections Hepatitis type a  . Headache(784.0)   . Cancer ([place])     melonoma  . Dizziness     MRI brain unremarkable 02/05/2015.   Past Surgical History   Procedure Laterality Date  . Tonsillectomy  1969  . Knee surgery  yrs ago    right knee  . Anterior cervical decomp/discectomy fusion  10/15/2011    Procedure: ANTERIOR CERVICAL DECOMPRESSION/DISCECTOMY FUSION 1 LEVEL;  Surgeon: [name], MD;  Location: [facility];  Service: Neurosurgery;  Laterality: N/A;  Cervical four-five Anterior cervical decompression/diskectomy/fusion  . Acl and mensicus repair reconstruct Right 1980's  . Knee arthroscopy Left last done oct 2013    x 3  . Appendectomy  1969  . Total knee arthroplasty Left 10/22/2012    Procedure: TOTAL KNEE ARTHROPLASTY;  Surgeon: [name], MD;  Location: [facility];  Service: Orthopedics;  Laterality: Left;   Family History  Problem Relation Age of Onset  . Cancer Brother     brain tumor  . Diabetes Other   . Cancer Other     ovarian   Social History  Substance Use Topics  . Smoking status: Never Smoker   . Smokeless tobacco: Former Systems developer    Quit date: 02/26/2003  . Alcohol Use: No      Review of Systems  Constitutional: Positive for fever, chills and fatigue.  HENT: Positive for congestion, ear pain, sinus pressure and sore throat. Negative for trouble swallowing.   Respiratory: Positive for cough.   Gastrointestinal: Negative for nausea, vomiting and abdominal pain.  Neurological: Positive for headaches. Negative  for dizziness, syncope, weakness and light-headedness.    Allergies  Vancomycin; Heparin; Penicillins; and Succinylcholine chloride  Home Medications   Prior to Admission medications   Medication Sig Start Date End Date Taking? Authorizing Provider  ALPRAZolam (XANAX) 0.25 MG tablet TAKE 1 TABLET BY MOUTH TWICE A DAY AS NEEDED FOR ANXIETY 04/27/15   Doe-Hyun R Shawna Orleans, DO  amLODipine (NORVASC) 5 MG tablet TAKE 1 TABLET BY MOUTH DAILY 02/06/15   Doe-Hyun R Shawna Orleans, DO  aspirin EC 81 MG tablet Take 1 tablet (81 mg total) by mouth daily. 10/24/12   [name], [name]  citalopram (CELEXA) 20 MG tablet  TAKE 1 TABLET (20 MG TOTAL) BY MOUTH DAILY. 04/03/15   Doe-Hyun R Shawna Orleans, DO  co-enzyme Q-10 30 MG capsule Take 30 mg by mouth daily.    Historical Provider, MD  doxycycline (VIBRA-TABS) 100 MG tablet Take 1 tablet (100 mg total) by mouth 2 (two) times daily. 06/08/15   Delano Metz, FNP  esomeprazole (NEXIUM) 40 MG capsule TAKE 1 CAPSULE (40 MG TOTAL) BY MOUTH DAILY BEFORE BREAKFAST. 05/22/15   Doe-Hyun R Shawna Orleans, DO  finasteride (PROSCAR) 5 MG tablet Take 1 tablet (5 mg total) by mouth daily. 05/12/15   Doe-Hyun R Shawna Orleans, DO  meclizine (ANTIVERT) 50 MG tablet Take 1 tablet (50 mg total) by mouth 3 (three) times daily as needed for dizziness. 12/13/14   Dorothyann Peng, NP  Melatonin 5 MG TABS Take by mouth.    Historical Provider, MD  metFORMIN (GLUCOPHAGE) 500 MG tablet TAKE 1 TABLET BY MOUTH TWICE A DAY WITH A MEAL 04/03/15   Doe-Hyun R Shawna Orleans, DO  ondansetron (ZOFRAN) 4 MG tablet Take 1 tablet (4 mg total) by mouth 2 (two) times daily as needed for nausea. 02/07/14   Doe-Hyun Kyra Searles, DO  oxyCODONE-acetaminophen (PERCOCET) 10-325 MG tablet  06/07/15   Historical Provider, MD  pravastatin (PRAVACHOL) 20 MG tablet TAKE 1 TABLET (20 MG TOTAL) BY MOUTH DAILY. 04/03/15   Doe-Hyun R Shawna Orleans, DO  tamsulosin (FLOMAX) 0.4 MG CAPS capsule TAKE 1 CAPSULE (0.4 MG TOTAL) BY MOUTH DAILY. 05/12/15   Marletta Lor, MD  valsartan (DIOVAN) 160 MG tablet TAKE 1 TABLET BY MOUTH EVERY DAY 01/04/15   Doe-Hyun R Shawna Orleans, DO  zolpidem (AMBIEN) 10 MG tablet Take 1 tablet (10 mg total) by mouth at bedtime. 04/07/15   Doe-Hyun R Shawna Orleans, DO    BP 133/83 mmHg  Pulse 90  Temp(Src) 98.2 F (36.8 C) (Oral)  Resp 17  SpO2 96% Physical Exam  Constitutional: He is oriented to person, place, and time. He appears well-developed and well-nourished. No distress.  HENT:  Head: Normocephalic and atraumatic.  Right Ear: Hearing, tympanic membrane, external ear and ear canal normal.  Left Ear: Hearing, external ear and ear canal normal. Tympanic membrane is  erythematous.  Nose: Right sinus exhibits maxillary sinus tenderness and frontal sinus tenderness. Left sinus exhibits maxillary sinus tenderness and frontal sinus tenderness.  Mouth/Throat: Uvula is midline, oropharynx is clear and moist and mucous membranes are normal. No oropharyngeal exudate, posterior oropharyngeal edema, posterior oropharyngeal erythema or tonsillar abscesses.  Mild erythema to left TM. Diffuse TTP to sinuses, L>R.  Eyes: Conjunctivae, EOM and lids are normal. Pupils are equal, round, and reactive to light. Right eye exhibits no discharge. Left eye exhibits no discharge. No scleral icterus.  Neck: Normal range of motion. Neck supple.  Cardiovascular: Normal rate, regular rhythm, normal heart sounds, intact distal pulses and normal pulses.   Pulmonary/Chest: Effort  normal and breath sounds normal. No respiratory distress. He has no wheezes. He has no rales.  Abdominal: Soft. Normal appearance and bowel sounds are normal. He exhibits no distension and no mass. There is no tenderness. There is no rigidity, no rebound and no guarding.  Musculoskeletal: Normal range of motion. He exhibits no edema or tenderness.  Neurological: He is alert and oriented to person, place, and time. He has normal strength. No cranial nerve deficit or sensory deficit.  Skin: Skin is warm, dry and intact. No rash noted. He is not diaphoretic. No erythema. No pallor.  Psychiatric: He has a normal mood and affect. His speech is normal and behavior is normal.  Nursing note and vitals reviewed.   ED Course  Procedures (including critical care time)  DIAGNOSTIC STUDIES: Oxygen Saturation is 96% on RA, normal by my interpretation.    COORDINATION OF CARE: 6:08 PM-Discussed treatment plan which includes CXR and percocet/roxicet with pt at bedside and pt agreed to plan.   Labs Review Labs Reviewed - No data to display  Imaging Review Dg Chest 2 View  06/08/2015  CLINICAL DATA:  Acute onset of left  ear pain and eye drainage. Left-sided headache. Sore throat and productive cough. Nasal drainage. Initial encounter. EXAM: CHEST  2 VIEW COMPARISON:  Chest radiograph performed 10/16/2012 FINDINGS: The lungs are well-aerated. Mild left basilar opacity may reflect mild pneumonia, given the patient's symptoms. No pleural effusion or pneumothorax is seen. The heart is mildly enlarged. No acute osseous abnormalities are seen. Cervical spinal fusion hardware is noted. IMPRESSION: Mild left basilar airspace opacity may reflect mild pneumonia, given the patient's symptoms. Mild cardiomegaly. Electronically Signed   By: Garald Balding M.D.   On: 06/08/2015 18:36   I have personally reviewed and evaluated these images as part of my medical decision-making.   EKG Interpretation None      MDM   Final diagnoses:  CAP (community acquired pneumonia)  Left ear pain  Acute sinusitis, recurrence not specified, unspecified location    65 year old male presents with subjective fever, chills, sinus pressure, nasal congestion, sore throat, and left ear pain for the past 3 days. He also notes cough productive of yellow sputum. He was evaluated in urgent care yesterday and started on azithromycin. He was seen by his PCP today and switch to doxycycline. His family states he continued to act tired, prompting them to bring him to the ED.   Patient is afebrile. Vital signs stable. Mild erythema to left TM. No significant bulging. No drainage. No erythema, edema, or exudate to posterior oropharynx. Diffuse TTP to sinuses bilaterally, L>R. Heart regular rate and rhythm. Lungs clear to auscultation bilaterally. Abdomen soft, nontender, nondistended.   Patient given pain medication. Imaging pending.   Chest x-ray remarkable for mild left basilar airspace opacity, which may reflect pneumonia. Discussed findings with patient. He is nontoxic and well-appearing with no tachycardia, tachypnea, or hypoxia, feel he is stable for  discharge at this time. Advised to continue doxycycline. Patient states he has pain medication at home. Also recommended he try warm honey, tea, and throat lozenges for additional symptom relief. Patient to follow-up with PCP early next week. Strict return precautions discussed. Patient verbalizes his understanding and is in agreement with plan.  BP 133/83 mmHg  Pulse 90  Temp(Src) 98.2 F (36.8 C) (Oral)  Resp 17  SpO2 96%   Marella Chimes, [name] 06/08/15 1852  Lacretia Leigh, MD 06/12/15 1537

## 2015-06-08 NOTE — Discharge Instructions (Signed)
1. Medications: doxycycline, pain medication, usual home medications 2. Treatment: rest, drink plenty of fluids; try warm honey, tea, throat lozenges for additional symptom relief 3. Follow Up: please followup with your primary doctor in 3-5 days for discussion of your diagnoses and further evaluation after today's visit; please return to the ER for high fever, increased pain, shortness of breath, new or worsening symptoms   Community-Acquired Pneumonia, Adult Pneumonia is an infection of the lungs. One type of pneumonia can happen while a person is in a hospital. A different type can happen when a person is not in a hospital (community-acquired pneumonia). It is easy for this kind to spread from person to person. It can spread to you if you breathe near an infected person who coughs or sneezes. Some symptoms include:  A dry cough.  A wet (productive) cough.  Fever.  Sweating.  Chest pain. HOME CARE  Take over-the-counter and prescription medicines only as told by your doctor.  Only take cough medicine if you are losing sleep.  If you were prescribed an antibiotic medicine, take it as told by your doctor. Do not stop taking the antibiotic even if you start to feel better.  Sleep with your head and neck raised (elevated). You can do this by putting a few pillows under your head, or you can sleep in a recliner.  Do not use tobacco products. These include cigarettes, chewing tobacco, and e-cigarettes. If you need help quitting, ask your doctor.  Drink enough water to keep your pee (urine) clear or pale yellow. A shot (vaccine) can help prevent pneumonia. Shots are often suggested for:  People older than 64 years of age.  People older than 64 years of age:  Who are having cancer treatment.  Who have long-term (chronic) lung disease.  Who have problems with their body's defense system (immune system). You may also prevent pneumonia if you take these actions:  Get the flu  (influenza) shot every year.  Go to the dentist as often as told.  Wash your hands often. If soap and water are not available, use hand sanitizer. GET HELP IF:  You have a fever.  You lose sleep because your cough medicine does not help. GET HELP RIGHT AWAY IF:  You are short of breath and it gets worse.  You have more chest pain.  Your sickness gets worse. This is very serious if:  You are an older adult.  Your body's defense system is weak.  You cough up blood.   This information is not intended to replace advice given to you by your health care provider. Make sure you discuss any questions you have with your health care provider.   Document Released: 07/31/2007 Document Revised: 11/02/2014 Document Reviewed: 06/08/2014 Elsevier Interactive Patient Education 2016 [employer].  Sinusitis, Adult Sinusitis is redness, soreness, and puffiness (inflammation) of the air pockets in the bones of your face (sinuses). The redness, soreness, and puffiness can cause air and mucus to get trapped in your sinuses. This can allow germs to grow and cause an infection.  HOME CARE   Drink enough fluids to keep your pee (urine) clear or pale yellow.  Use a humidifier in your home.  Run a hot shower to create steam in the bathroom. Sit in the bathroom with the door closed. Breathe in the steam 3-4 times a day.  Put a warm, moist washcloth on your face 3-4 times a day, or as told by your doctor.  Use salt water sprays (  saline sprays) to wet the thick fluid in your nose. This can help the sinuses drain.  Only take medicine as told by your doctor. GET HELP RIGHT AWAY IF:   Your pain gets worse.  You have very bad headaches.  You are sick to your stomach (nauseous).  You throw up (vomit).  You are very sleepy (drowsy) all the time.  Your face is puffy (swollen).  Your vision changes.  You have a stiff neck.  You have trouble breathing. MAKE SURE YOU:   Understand these  instructions.  Will watch your condition.  Will get help right away if you are not doing well or get worse.   This information is not intended to replace advice given to you by your health care provider. Make sure you discuss any questions you have with your health care provider.   Document Released: 07/31/2007 Document Revised: 03/04/2014 Document Reviewed: 09/17/2011 Elsevier Interactive Patient Education [employer].

## 2015-06-08 NOTE — ED Notes (Addendum)
Pt c/o left ear pain, eye drainage, and left headache x 3 days with sore throat, productive yellow-brown cou,gh and nasal drainage.  Ambulatory on arrival, no acute distress noted. Denies recent exposure to illness.

## 2015-06-08 NOTE — ED Notes (Signed)
Pt complains of sinus pressure, drainage from left ear. Pt went to PCP today and was treated for sinus infection. [name] brought pt to ED d/t pt seeming more lethargic than usual. Pt''s wife says pt appears more alert at this time.

## 2015-06-08 NOTE — Progress Notes (Signed)
Subjective:    Patient ID: Dylan [name], male    DOB: Feb 18, 1951, 65 y.o.   MRN: [id number]  HPI  Dylan [name] is a 65 year old male who presents today with a 10 day history of sinus pressure/pain, rhinitis with yellow/brown drainage, left ear pain, congestion, and "scratchy throat". Associated symptoms of a cough that does not wake him at night and has been productive of yellow sputum and a fever last night. He does not remember his temperature but states he had a fever and noticed chills and sweats during the night. He denies recent sick contacts and history of asthma/bronchitis. He reports a history of "sinus issues" in the past. Yesterday, he was seen at an [facility] and prescribed a Z-pack for his ear pain. Other treatments have included Mucinex DM and Tylenol with limited benefit. Pertinent history of allergic rhinitis symptoms of itchy watery eyes and rhinitis for more than a month. No treatment for allergies at this time.  Review of Systems  Constitutional: Positive for fever and chills. Negative for fatigue.  HENT: Positive for congestion, ear pain, postnasal drip, rhinorrhea, sinus pressure and sore throat.   Eyes: Positive for itching. Negative for pain, redness and visual disturbance.  Respiratory: Positive for cough. Negative for shortness of breath and wheezing.   Cardiovascular: Negative for chest pain, palpitations and leg swelling.  Gastrointestinal: Negative for nausea, vomiting, abdominal pain and diarrhea.  Genitourinary: Negative for dysuria, urgency, frequency, hematuria and flank pain.  Musculoskeletal:       Denies generalized body aches  Skin: Negative for rash.  Allergic/Immunologic: Positive for environmental allergies.  Neurological: Negative for dizziness, light-headedness and headaches.  Psychiatric/Behavioral:       Denies depressed or anxious mood   Past Medical History  Diagnosis Date  . Allergy     allergic rhinitis  . Hyperlipidemia   .  History of ulcerative colitis     non recent flares  . History of melanoma 15 yrs ago    face and forehead-- previously followed by Derm in [place]  . Diabetes mellitus   . Hypertension   . Sleep apnea     last sleep study 10 yrs ago,CPAP  . Complication of anesthesia     trouble waking up with succycholine, does not have enzyme to break down succycholine  . Hepatitis age 75    history of infections Hepatitis type a  . Headache(784.0)   . Cancer ([place])     melonoma  . Dizziness     MRI brain unremarkable 02/05/2015.    Social History   Social History  . Marital Status: Married    Spouse Name: N/A  . Number of Children: N/A  . Years of Education: N/A   Occupational History  . Not on file.   Social History Main Topics  . Smoking status: Never Smoker   . Smokeless tobacco: Former Systems developer    Quit date: 02/26/2003  . Alcohol Use: No  . Drug Use: No  . Sexual Activity: Not on file   Other Topics Concern  . Not on file   Social History Narrative   Wife works at cath lab at [employer] advanced breast cancer    Past Surgical History  Procedure Laterality Date  . Tonsillectomy  1969  . Knee surgery  yrs ago    right knee  . Anterior cervical decomp/discectomy fusion  10/15/2011    Procedure: ANTERIOR CERVICAL DECOMPRESSION/DISCECTOMY FUSION 1 LEVEL;  Surgeon: [name], MD;  Location: [facility]  NEURO ORS;  Service: Neurosurgery;  Laterality: N/A;  Cervical four-five Anterior cervical decompression/diskectomy/fusion  . Acl and mensicus repair reconstruct Right 1980's  . Knee arthroscopy Left last done oct 2013    x 3  . Appendectomy  1969  . 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 Cancer Brother     brain tumor  . Diabetes Other   . Cancer Other     ovarian    Allergies  Allergen Reactions    . Vancomycin Hives    Ran off a pump. So developed redman's . Running too fast  . Heparin     REACTION: breathing problems  . Penicillins     REACTION: Rash  . Succinylcholine Chloride     REACTION: does not wake up from anesthetic    Current Outpatient Prescriptions on File Prior to Visit  Medication Sig Dispense Refill  . ALPRAZolam (XANAX) 0.25 MG tablet TAKE 1 TABLET BY MOUTH TWICE A DAY AS NEEDED FOR ANXIETY 60 tablet 2  . amLODipine (NORVASC) 5 MG tablet TAKE 1 TABLET BY MOUTH DAILY 90 tablet 3  . aspirin EC 81 MG tablet Take 1 tablet (81 mg total) by mouth daily.    . citalopram (CELEXA) 20 MG tablet TAKE 1 TABLET (20 MG TOTAL) BY MOUTH DAILY. 90 tablet 1  . co-enzyme Q-10 30 MG capsule Take 30 mg by mouth daily.    Marland Kitchen esomeprazole (NEXIUM) 40 MG capsule TAKE 1 CAPSULE (40 MG TOTAL) BY MOUTH DAILY BEFORE BREAKFAST. 90 capsule 0  . finasteride (PROSCAR) 5 MG tablet Take 1 tablet (5 mg total) by mouth daily. 90 tablet 0  . meclizine (ANTIVERT) 50 MG tablet Take 1 tablet (50 mg total) by mouth 3 (three) times daily as needed for dizziness. 30 tablet 3  . Melatonin 5 MG TABS Take by mouth.    . metFORMIN (GLUCOPHAGE) 500 MG tablet TAKE 1 TABLET BY MOUTH TWICE A DAY WITH A MEAL 180 tablet 1  . ondansetron (ZOFRAN) 4 MG tablet Take 1 tablet (4 mg total) by mouth 2 (two) times daily as needed for nausea. 30 tablet 0  . pravastatin (PRAVACHOL) 20 MG tablet TAKE 1 TABLET (20 MG TOTAL) BY MOUTH DAILY. 90 tablet 0  . tamsulosin (FLOMAX) 0.4 MG CAPS capsule TAKE 1 CAPSULE (0.4 MG TOTAL) BY MOUTH DAILY. 90 capsule 3  . valsartan (DIOVAN) 160 MG tablet TAKE 1 TABLET BY MOUTH EVERY DAY 90 tablet 1  . zolpidem (AMBIEN) 10 MG tablet Take 1 tablet (10 mg total) by mouth at bedtime. 30 tablet 2   No current facility-administered medications on file prior to visit.    BP 122/70 mmHg  Pulse 96  Temp(Src) 98 F (36.7 C) (Oral)  Wt 228 lb 12.8 oz (103.783 kg)       Objective:   Physical  Exam  Constitutional: He is oriented to person, place, and time. He appears well-developed.  HENT:  Right Ear: Tympanic membrane normal.  Nose: Rhinorrhea present. Right sinus exhibits maxillary sinus tenderness and frontal sinus tenderness. Left sinus exhibits maxillary sinus tenderness and frontal sinus tenderness.  Mouth/Throat: Mucous membranes are normal. No oropharyngeal exudate or posterior oropharyngeal erythema.  Left TM is dull with diffuse light reflex and scarring noted. No redness or bulging of TM noted. Erythematous nasal  membranes  Eyes: Pupils are equal, round, and reactive to light.  Cardiovascular: Normal rate and regular rhythm.   Pulmonary/Chest: Effort normal and breath sounds normal. He has no wheezes. He has no rales.  Lymphadenopathy:    He has cervical adenopathy.  Neurological: He is alert and oriented to person, place, and time.  Skin: Skin is warm and dry. No rash noted.  Psychiatric: He has a normal mood and affect. His behavior is normal. Judgment and thought content normal.       Assessment & Plan:  1. Pansinusitis, unspecified chronicity Stop azithromycin and start doxycycline today for symptoms.  Advised patient on supportive measures:  Get rest, drink plenty of fluids, and use tylenol or ibuprofen as needed for pain. Follow up if fever >101, if symptoms worsen or if symptoms are not improved in 3 days. Patient verbalizes understanding.   - doxycycline (VIBRA-TABS) 100 MG tablet; Take 1 tablet (100 mg total) by mouth 2 (two) times daily.  Dispense: 20 tablet; Refill: 0  2. Other seasonal allergic rhinitis Allegra, Claritin, or Zyrtec can be used with flonase for symptoms of allergic rhinitis.   3. Cough Continue Mucinex DM for cough as needed and drink plenty of fluids.

## 2015-06-08 NOTE — Progress Notes (Signed)
Pre visit review using our clinic review tool, if applicable. No additional management support is needed unless otherwise documented below in the visit note. 

## 2015-06-08 NOTE — Patient Instructions (Signed)
Please stop azithromycin and begin doxycycline as prescribed.   Sinusitis, Adult Sinusitis is redness, soreness, and inflammation of the paranasal sinuses. Paranasal sinuses are air pockets within the bones of your face. They are located beneath your eyes, in the middle of your forehead, and above your eyes. In healthy paranasal sinuses, mucus is able to drain out, and air is able to circulate through them by way of your nose. However, when your paranasal sinuses are inflamed, mucus and air can become trapped. This can allow bacteria and other germs to grow and cause infection. Sinusitis can develop quickly and last only a short time (acute) or continue over a long period (chronic). Sinusitis that lasts for more than 12 weeks is considered chronic. CAUSES Causes of sinusitis include:  Allergies.  Structural abnormalities, such as displacement of the cartilage that separates your nostrils (deviated septum), which can decrease the air flow through your nose and sinuses and affect sinus drainage.  Functional abnormalities, such as when the small hairs (cilia) that line your sinuses and help remove mucus do not work properly or are not present. SIGNS AND SYMPTOMS Symptoms of acute and chronic sinusitis are the same. The primary symptoms are pain and pressure around the affected sinuses. Other symptoms include:  Upper toothache.  Earache.  Headache.  Bad breath.  Decreased sense of smell and taste.  A cough, which worsens when you are lying flat.  Fatigue.  Fever.  Thick drainage from your nose, which often is green and may contain pus (purulent).  Swelling and warmth over the affected sinuses. DIAGNOSIS Your health care provider will perform a physical exam. During your exam, your health care provider may perform any of the following to help determine if you have acute sinusitis or chronic sinusitis:  Look in your nose for signs of abnormal growths in your nostrils (nasal  polyps).  Tap over the affected sinus to check for signs of infection.  View the inside of your sinuses using an imaging device that has a light attached (endoscope). If your health care provider suspects that you have chronic sinusitis, one or more of the following tests may be recommended:  Allergy tests.  Nasal culture. A sample of mucus is taken from your nose, sent to a lab, and screened for bacteria.  Nasal cytology. A sample of mucus is taken from your nose and examined by your health care provider to determine if your sinusitis is related to an allergy. TREATMENT Most cases of acute sinusitis are related to a viral infection and will resolve on their own within 10 days. Sometimes, medicines are prescribed to help relieve symptoms of both acute and chronic sinusitis. These may include pain medicines, decongestants, nasal steroid sprays, or saline sprays. However, for sinusitis related to a bacterial infection, your health care provider will prescribe antibiotic medicines. These are medicines that will help kill the bacteria causing the infection. Rarely, sinusitis is caused by a fungal infection. In these cases, your health care provider will prescribe antifungal medicine. For some cases of chronic sinusitis, surgery is needed. Generally, these are cases in which sinusitis recurs more than 3 times per year, despite other treatments. HOME CARE INSTRUCTIONS  Drink plenty of water. Water helps thin the mucus so your sinuses can drain more easily.  Use a humidifier.  Inhale steam 3-4 times a day (for example, sit in the bathroom with the shower running).  Apply a warm, moist washcloth to your face 3-4 times a day, or as directed by your  health care provider.  Use saline nasal sprays to help moisten and clean your sinuses.  Take medicines only as directed by your health care provider.  If you were prescribed either an antibiotic or antifungal medicine, finish it all even if you start  to feel better. SEEK IMMEDIATE MEDICAL CARE IF:  You have increasing pain or severe headaches.  You have nausea, vomiting, or drowsiness.  You have swelling around your face.  You have vision problems.  You have a stiff neck.  You have difficulty breathing.   This information is not intended to replace advice given to you by your health care provider. Make sure you discuss any questions you have with your health care provider.   Document Released: 02/11/2005 Document Revised: 03/04/2014 Document Reviewed: 02/26/2011 Elsevier Interactive Patient Education [employer].

## 2015-06-14 ENCOUNTER — Other Ambulatory Visit: Payer: Self-pay | Admitting: Internal Medicine

## 2015-06-16 ENCOUNTER — Encounter: Payer: Self-pay | Admitting: Internal Medicine

## 2015-06-16 ENCOUNTER — Ambulatory Visit (INDEPENDENT_AMBULATORY_CARE_PROVIDER_SITE_OTHER): Payer: Managed Care, Other (non HMO) | Admitting: Internal Medicine

## 2015-06-16 VITALS — BP 130/80 | HR 89 | Temp 98.0°F | Resp 20 | Ht 72.0 in | Wt 226.0 lb

## 2015-06-16 DIAGNOSIS — E119 Type 2 diabetes mellitus without complications: Secondary | ICD-10-CM | POA: Diagnosis not present

## 2015-06-16 DIAGNOSIS — H6502 Acute serous otitis media, left ear: Secondary | ICD-10-CM | POA: Diagnosis not present

## 2015-06-16 NOTE — Progress Notes (Signed)
Pre visit review using our clinic review tool, if applicable. No additional management support is needed unless otherwise documented below in the visit note. 

## 2015-06-16 NOTE — Patient Instructions (Signed)
Serous Otitis Media Serous otitis media is fluid in the middle ear space. This space contains the bones for hearing and air. Air in the middle ear space helps to transmit sound.  The air gets there through the eustachian tube. This tube goes from the back of the nose (nasopharynx) to the middle ear space. It keeps the pressure in the middle ear the same as the outside world. It also helps to drain fluid from the middle ear space. CAUSES  Serous otitis media occurs when the eustachian tube gets blocked. Blockage can come from:  Ear infections.  Colds and other upper respiratory infections.  Allergies.  Irritants such as cigarette smoke.  Sudden changes in air pressure (such as descending in an airplane).  Enlarged adenoids.  A mass in the nasopharynx. During colds and upper respiratory infections, the middle ear space can become temporarily filled with fluid. This can happen after an ear infection also. Once the infection clears, the fluid will generally drain out of the ear through the eustachian tube. If it does not, then serous otitis media occurs. SIGNS AND SYMPTOMS   Hearing loss.  A feeling of fullness in the ear, without pain.  Young children may not show any symptoms but may show slight behavioral changes, such as agitation, ear pulling, or crying. DIAGNOSIS  Serous otitis media is diagnosed by an ear exam. Tests may be done to check on the movement of the eardrum. Hearing exams may also be done. TREATMENT  The fluid most often goes away without treatment. If allergy is the cause, allergy treatment may be helpful. Fluid that persists for several months may require minor surgery. A small tube is placed in the eardrum to:  Drain the fluid.  Restore the air in the middle ear space. In certain situations, antibiotic medicines are used to avoid surgery. Surgery may be done to remove enlarged adenoids (if this is the cause). HOME CARE INSTRUCTIONS   Keep children away from  tobacco smoke.  Keep all follow-up visits as directed by your health care provider. SEEK MEDICAL CARE IF:   Your hearing is not better in 3 months.  Your hearing is worse.  You have ear pain.  You have drainage from the ear.  You have dizziness.  You have serous otitis media only in one ear or have any bleeding from your nose (epistaxis).  You notice a lump on your neck. MAKE SURE YOU:  Understand these instructions.   Will watch your condition.   Will get help right away if you are not doing well or get worse.    This information is not intended to replace advice given to you by your health care provider. Make sure you discuss any questions you have with your health care provider.   Document Released: 05/04/2003 Document Revised: 03/04/2014 Document Reviewed: 09/08/2012 Elsevier Interactive Patient Education 2016 Elsevier Inc.  

## 2015-06-16 NOTE — Progress Notes (Signed)
Subjective:    Patient ID: Dylan [name], male    DOB: [birth year], 65 y.o.   MRN: [id number]  HPI  65 year old patient who has been seen recently for a URI.  Symptoms have included left ear pain.  He has been treated with azithromycin followed by doxycycline.  He has had an office visit and an ER visit.  Left ear pain has resolved, but he still has diminished left auditory acuity.  He generally feels much improved No fever.  Concern about a ruptured left tympanic membrane  Past Medical History  Diagnosis Date  . Allergy     allergic rhinitis  . Hyperlipidemia   . History of ulcerative colitis     non recent flares  . History of melanoma 15 yrs ago    face and forehead-- previously followed by Derm in [place]  . Diabetes mellitus   . Hypertension   . Sleep apnea     last sleep study 10 yrs ago,CPAP  . Complication of anesthesia     trouble waking up with succycholine, does not have enzyme to break down succycholine  . Hepatitis age 54    history of infections Hepatitis type a  . Headache(784.0)   . Cancer ([place])     melonoma  . Dizziness     MRI brain unremarkable 02/05/2015.     Social History   Social History  . Marital Status: Married    Spouse Name: N/A  . Number of Children: N/A  . Years of Education: N/A   Occupational History  . Not on file.   Social History Main Topics  . Smoking status: Never Smoker   . Smokeless tobacco: Former Systems developer    Quit date: 02/26/2003  . Alcohol Use: No  . Drug Use: No  . Sexual Activity: Not on file   Other Topics Concern  . Not on file   Social History Narrative   Wife works at cath lab at [employer] advanced breast cancer    Past Surgical History  Procedure Laterality Date  . Tonsillectomy  1969  . Knee surgery  yrs ago    right knee  . Anterior cervical decomp/discectomy fusion  10/15/2011    Procedure: ANTERIOR CERVICAL DECOMPRESSION/DISCECTOMY FUSION 1 LEVEL;  Surgeon: [name], MD;  Location: [facility] NEURO ORS;   Service: Neurosurgery;  Laterality: N/A;  Cervical four-five Anterior cervical decompression/diskectomy/fusion  . Acl and mensicus repair reconstruct Right 1980's  . Knee arthroscopy Left last done oct 2013    x 3  . Appendectomy  1969  . 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 Cancer Brother     brain tumor  . Diabetes Other   . Cancer Other     ovarian    Allergies  Allergen Reactions  . Vancomycin Hives    Ran off a pump. So developed redman's . Running too fast  . Heparin     REACTION: breathing problems  . Penicillins     REACTION: Rash  . Succinylcholine Chloride     REACTION: does not wake up from anesthetic    Current Outpatient Prescriptions on File Prior to Visit  Medication Sig Dispense Refill  . ALPRAZolam (XANAX) 0.25 MG tablet TAKE 1 TABLET BY MOUTH TWICE A DAY AS NEEDED FOR ANXIETY 60 tablet 2  .  amLODipine (NORVASC) 5 MG tablet TAKE 1 TABLET BY MOUTH DAILY 90 tablet 3  . aspirin EC 81 MG tablet Take 1 tablet (81 mg total) by mouth daily.    . citalopram (CELEXA) 20 MG tablet TAKE 1 TABLET (20 MG TOTAL) BY MOUTH DAILY. 90 tablet 1  . co-enzyme Q-10 30 MG capsule Take 30 mg by mouth daily.    Marland Kitchen doxycycline (VIBRA-TABS) 100 MG tablet Take 1 tablet (100 mg total) by mouth 2 (two) times daily. 20 tablet 0  . esomeprazole (NEXIUM) 40 MG capsule TAKE 1 CAPSULE (40 MG TOTAL) BY MOUTH DAILY BEFORE BREAKFAST. 90 capsule 0  . finasteride (PROSCAR) 5 MG tablet Take 1 tablet (5 mg total) by mouth daily. 90 tablet 0  . Melatonin 5 MG TABS Take by mouth.    . metFORMIN (GLUCOPHAGE) 500 MG tablet TAKE 1 TABLET BY MOUTH TWICE A DAY WITH A MEAL 180 tablet 1  . oxyCODONE-acetaminophen (PERCOCET) 10-325 MG tablet     . pravastatin (PRAVACHOL) 20 MG tablet TAKE 1 TABLET (20 MG TOTAL) BY MOUTH DAILY.  90 tablet 0  . tamsulosin (FLOMAX) 0.4 MG CAPS capsule TAKE 1 CAPSULE (0.4 MG TOTAL) BY MOUTH DAILY. 90 capsule 3  . valsartan (DIOVAN) 160 MG tablet TAKE 1 TABLET BY MOUTH EVERY DAY 90 tablet 1  . zolpidem (AMBIEN) 10 MG tablet Take 1 tablet (10 mg total) by mouth at bedtime. 30 tablet 2   No current facility-administered medications on file prior to visit.    BP 130/80 mmHg  Pulse 89  Temp(Src) 98 F (36.7 C) (Oral)  Resp 20  Ht 6' (1.829 m)  Wt 226 lb (102.513 kg)  BMI 30.64 kg/m2  SpO2 98%     Review of Systems  Constitutional: Negative for fever, chills, appetite change and fatigue.  HENT: Positive for ear pain, hearing loss, rhinorrhea and sinus pressure. Negative for congestion, dental problem, sore throat, tinnitus, trouble swallowing and voice change.   Eyes: Negative for pain, discharge and visual disturbance.  Respiratory: Negative for cough, chest tightness, wheezing and stridor.   Cardiovascular: Negative for chest pain, palpitations and leg swelling.  Gastrointestinal: Negative for nausea, vomiting, abdominal pain, diarrhea, constipation, blood in stool and abdominal distention.  Genitourinary: Negative for urgency, hematuria, flank pain, discharge, difficulty urinating and genital sores.  Musculoskeletal: Negative for myalgias, back pain, joint swelling, arthralgias, gait problem and neck stiffness.  Skin: Negative for rash.  Neurological: Negative for dizziness, syncope, speech difficulty, weakness, numbness and headaches.  Hematological: Negative for adenopathy. Does not bruise/bleed easily.  Psychiatric/Behavioral: Negative for behavioral problems and dysphoric mood. The patient is not nervous/anxious.        Objective:   Physical Exam  Constitutional: He is oriented to person, place, and time. He appears well-developed.  HENT:  Head: Normocephalic.  Right Ear: External ear normal.  Left tympanic membrane dull with absent light reflex Weber lateralizes to  the left  Eyes: Conjunctivae and EOM are normal.  Neck: Normal range of motion.  Cardiovascular: Normal rate and normal heart sounds.   Pulmonary/Chest: Breath sounds normal.  Abdominal: Bowel sounds are normal.  Musculoskeletal: Normal range of motion. He exhibits no edema or tenderness.  Neurological: He is alert and oriented to person, place, and time.  Psychiatric: He has a normal mood and affect. His behavior is normal.          Assessment & Plan:   Left serous otitis media.  Patient has completed antibiotic therapy. Will observe  at this point.  When necessary decongestants Consider ENT referral if symptoms persist

## 2015-06-19 ENCOUNTER — Other Ambulatory Visit: Payer: Self-pay | Admitting: Internal Medicine

## 2015-06-19 ENCOUNTER — Encounter: Payer: Self-pay | Admitting: Internal Medicine

## 2015-06-19 DIAGNOSIS — J324 Chronic pansinusitis: Secondary | ICD-10-CM

## 2015-06-19 MED ORDER — DOXYCYCLINE HYCLATE 100 MG PO TABS: 100.0000 mg | ORAL_TABLET | Freq: Two times a day (BID) | ORAL | Status: DC

## 2015-06-28 ENCOUNTER — Other Ambulatory Visit: Payer: Self-pay | Admitting: Internal Medicine

## 2015-06-29 ENCOUNTER — Ambulatory Visit (INDEPENDENT_AMBULATORY_CARE_PROVIDER_SITE_OTHER)
Admission: RE | Admit: 2015-06-29 | Discharge: 2015-06-29 | Disposition: A | Discharge status: Home / Self Care | Payer: Managed Care, Other (non HMO) | Source: Ambulatory Visit | Attending: Adult Health | Admitting: Adult Health

## 2015-06-29 ENCOUNTER — Ambulatory Visit (INDEPENDENT_AMBULATORY_CARE_PROVIDER_SITE_OTHER): Payer: Managed Care, Other (non HMO) | Admitting: Adult Health

## 2015-06-29 ENCOUNTER — Encounter: Payer: Self-pay | Admitting: Adult Health

## 2015-06-29 VITALS — BP 118/70 | Temp 98.1°F | Wt 224.0 lb

## 2015-06-29 DIAGNOSIS — R350 Frequency of micturition: Secondary | ICD-10-CM | POA: Diagnosis not present

## 2015-06-29 DIAGNOSIS — J069 Acute upper respiratory infection, unspecified: Secondary | ICD-10-CM

## 2015-06-29 LAB — POCT URINALYSIS DIPSTICK
Bilirubin, UA: NEGATIVE
Blood, UA: NEGATIVE
Glucose, UA: NEGATIVE
Ketones, UA: NEGATIVE
LEUKOCYTES UA: NEGATIVE
NITRITE UA: NEGATIVE
PH UA: 6
Spec Grav, UA: 1.025
UROBILINOGEN UA: 0.2

## 2015-06-29 MED ORDER — HYDROCODONE-HOMATROPINE 5-1.5 MG/5ML PO SYRP
5.0000 mL | ORAL_SOLUTION | Freq: Three times a day (TID) | ORAL | Status: DC | PRN
Start: 2015-06-29 — End: 2015-09-12

## 2015-06-29 MED ORDER — DUTASTERIDE 0.5 MG PO CAPS: 0.5000 mg | ORAL_CAPSULE | Freq: Every day | ORAL | Status: DC

## 2015-06-29 NOTE — Patient Instructions (Addendum)
It was great seeing you today!  I have sent in a prescription for Avodart. Take this instead of Flomax  I will follow up with you regarding your chest x ray and then we can decide what to do

## 2015-06-29 NOTE — Progress Notes (Addendum)
Subjective:    Patient ID: Dylan [name], male    DOB: [birth year], 65 y.o.   MRN: [id number]  HPI  65 year old male who presents to the office today with two complaints.   1) For the last four days he has been having urinary frequency. This lasts all day and all night. He does have a history of BPH and is taking Flomax and Proscar. He does endorces decreased stream. He does not feel like he is emptying his bladder all the way.   2) URI type symptoms. His symptoms started about 4 weeks ago with ear pain and the " feeling like it was going to rupture" He also endorsed having PND and rhinorrhea. Now he feels like he is congested in the chest, has a productive cough, fatigue and thick brown mucus production. He has been treated with Azithromycin and Doxycyline. Was seen in the ER on 06/08/2015 and x ray showed possible pneumonia.  He has two days of doxycycline left. He has not had any improvement since starting doxycycline.   He denies fevers, nausea, vomiting, diarrhea.    Review of Systems  Constitutional: Positive for activity change and fatigue. Negative for fever, chills and diaphoresis.  HENT: Positive for congestion, postnasal drip, rhinorrhea and sinus pressure. Negative for ear discharge, ear pain and sore throat.   Respiratory: Positive for cough and shortness of breath. Negative for wheezing.   Cardiovascular: Negative.   Neurological: Negative.   Psychiatric/Behavioral: Positive for sleep disturbance.  All other systems reviewed and are negative.  Past Medical History  Diagnosis Date  . Allergy     allergic rhinitis  . Hyperlipidemia   . History of ulcerative colitis     non recent flares  . History of melanoma 15 yrs ago    face and forehead-- previously followed by Derm in [place]  . Diabetes mellitus   . Hypertension   . Sleep apnea     last sleep study 10 yrs ago,CPAP  . Complication of anesthesia     trouble waking up with succycholine, does not have enzyme to  break down succycholine  . Hepatitis age 69    history of infections Hepatitis type a  . Headache(784.0)   . Cancer ([place])     melonoma  . Dizziness     MRI brain unremarkable 02/05/2015.    Social History   Social History  . Marital Status: Married    Spouse Name: N/A  . Number of Children: N/A  . Years of Education: N/A   Occupational History  . Not on file.   Social History Main Topics  . Smoking status: Never Smoker   . Smokeless tobacco: Former Systems developer    Quit date: 02/26/2003  . Alcohol Use: No  . Drug Use: No  . Sexual Activity: Not on file   Other Topics Concern  . Not on file   Social History Narrative   Wife works at cath lab at [employer] advanced breast cancer    Past Surgical History  Procedure Laterality Date  . Tonsillectomy  1969  . Knee surgery  yrs ago    right knee  . Anterior cervical decomp/discectomy fusion  10/15/2011    Procedure: ANTERIOR CERVICAL DECOMPRESSION/DISCECTOMY FUSION 1 LEVEL;  Surgeon: [name], MD;  Location: [facility];  Service: Neurosurgery;  Laterality: N/A;  Cervical four-five Anterior cervical decompression/diskectomy/fusion  . Acl and mensicus repair reconstruct Right 1980's  . Knee arthroscopy Left last done oct 2013  x 3  . Appendectomy  1969  . 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 Cancer Brother     brain tumor  . Diabetes Other   . Cancer Other     ovarian    Allergies  Allergen Reactions  . Vancomycin Hives    Ran off a pump. So developed redman's . Running too fast  . Heparin     REACTION: breathing problems  . Penicillins     REACTION: Rash  . Succinylcholine Chloride     REACTION: does not wake up from anesthetic    Current Outpatient Prescriptions on File Prior to Visit  Medication Sig Dispense Refill  .  ALPRAZolam (XANAX) 0.25 MG tablet TAKE 1 TABLET BY MOUTH TWICE A DAY AS NEEDED FOR ANXIETY 60 tablet 2  . amLODipine (NORVASC) 5 MG tablet TAKE 1 TABLET BY MOUTH DAILY 90 tablet 3  . aspirin EC 81 MG tablet Take 1 tablet (81 mg total) by mouth daily.    . citalopram (CELEXA) 20 MG tablet TAKE 1 TABLET (20 MG TOTAL) BY MOUTH DAILY. 90 tablet 1  . co-enzyme Q-10 30 MG capsule Take 30 mg by mouth daily.    Marland Kitchen esomeprazole (NEXIUM) 40 MG capsule TAKE 1 CAPSULE (40 MG TOTAL) BY MOUTH DAILY BEFORE BREAKFAST. 90 capsule 0  . finasteride (PROSCAR) 5 MG tablet Take 1 tablet (5 mg total) by mouth daily. 90 tablet 0  . Melatonin 5 MG TABS Take by mouth.    . metFORMIN (GLUCOPHAGE) 500 MG tablet TAKE 1 TABLET BY MOUTH TWICE A DAY WITH A MEAL 180 tablet 1  . oxyCODONE-acetaminophen (PERCOCET) 10-325 MG tablet     . pravastatin (PRAVACHOL) 20 MG tablet TAKE 1 TABLET (20 MG TOTAL) BY MOUTH DAILY. 90 tablet 0  . tamsulosin (FLOMAX) 0.4 MG CAPS capsule TAKE 1 CAPSULE (0.4 MG TOTAL) BY MOUTH DAILY. 90 capsule 3  . traMADol (ULTRAM) 50 MG tablet Take 50 mg by mouth every 6 (six) hours as needed.     . valsartan (DIOVAN) 160 MG tablet TAKE 1 TABLET BY MOUTH EVERY DAY 90 tablet 1  . zolpidem (AMBIEN) 10 MG tablet TAKE 1 TABLET BY MOUTH AT BEDTIME 30 tablet 0   No current facility-administered medications on file prior to visit.    BP 118/70 mmHg  Temp(Src) 98.1 F (36.7 C) (Oral)  Wt 224 lb (101.606 kg)       Objective:   Physical Exam  Constitutional: He is oriented to person, place, and time. He appears well-developed and well-nourished. No distress.  Neck: Normal range of motion. Neck supple.  Cardiovascular: Normal rate, regular rhythm, normal heart sounds and intact distal pulses.  Exam reveals no gallop and no friction rub.   No murmur heard. Pulmonary/Chest: Effort normal. No respiratory distress. He has rhonchi in the right upper field and the left upper field. He has no rales. He exhibits no  tenderness.  Lymphadenopathy:    He has no cervical adenopathy.  Neurological: He is alert and oriented to person, place, and time. He displays normal reflexes. No cranial nerve deficit. He exhibits normal muscle tone. Coordination normal.  Skin: Skin is warm and dry. No rash noted. He is not diaphoretic. No erythema. No pallor.  Psychiatric: He has a normal mood and affect. His  behavior is normal. Judgment and thought content normal.  Nursing note and vitals reviewed.     Assessment & Plan:  1. Urinary frequency - POCT Urinalysis Dipstick- Negative  - dutasteride (AVODART) 0.5 MG capsule; Take 1 capsule (0.5 mg total) by mouth daily.  Dispense: 90 capsule; Refill: 1  2. URI (upper respiratory infection) - He had possible pneumonia less than a month ago. Will get another chest xray  - HYDROcodone-homatropine (HYCODAN) 5-1.5 MG/5ML syrup; Take 5 mLs by mouth every 8 (eight) hours as needed for cough.  Dispense: 120 mL; Refill: 0 - DG Chest 2 View; Future - Reluctant on starting another antibiotic due to risk of c.diff   Dorothyann Peng, NP

## 2015-06-30 ENCOUNTER — Encounter: Payer: Self-pay | Admitting: Adult Health

## 2015-06-30 MED ORDER — PREDNISONE 10 MG PO TABS: ORAL_TABLET | ORAL | Status: DC

## 2015-06-30 NOTE — Telephone Encounter (Signed)
Spoke to Dylan [name] and informed him of his x ray results. He continues to have a cough. I do not think that antibiotics will help him. Will trial a course of prednisone.

## 2015-07-01 ENCOUNTER — Encounter: Payer: Self-pay | Admitting: Family

## 2015-07-05 ENCOUNTER — Encounter: Payer: Managed Care, Other (non HMO) | Admitting: Family

## 2015-07-19 ENCOUNTER — Encounter: Payer: Self-pay | Admitting: Adult Health

## 2015-07-26 ENCOUNTER — Other Ambulatory Visit: Payer: Self-pay | Admitting: Internal Medicine

## 2015-07-27 ENCOUNTER — Other Ambulatory Visit: Payer: Self-pay | Admitting: Internal Medicine

## 2015-07-27 ENCOUNTER — Ambulatory Visit (INDEPENDENT_AMBULATORY_CARE_PROVIDER_SITE_OTHER): Payer: Managed Care, Other (non HMO) | Admitting: Adult Health

## 2015-07-27 ENCOUNTER — Encounter: Payer: Self-pay | Admitting: Adult Health

## 2015-07-27 VITALS — BP 104/58 | Temp 98.2°F | Wt 225.0 lb

## 2015-07-27 DIAGNOSIS — G4700 Insomnia, unspecified: Secondary | ICD-10-CM | POA: Diagnosis not present

## 2015-07-27 DIAGNOSIS — F411 Generalized anxiety disorder: Secondary | ICD-10-CM | POA: Diagnosis not present

## 2015-07-27 DIAGNOSIS — M545 Low back pain, unspecified: Secondary | ICD-10-CM

## 2015-07-27 MED ORDER — ALPRAZOLAM 0.25 MG PO TABS: ORAL_TABLET | ORAL | Status: DC

## 2015-07-27 MED ORDER — ZOLPIDEM TARTRATE 10 MG PO TABS: 10.0000 mg | ORAL_TABLET | Freq: Every day | ORAL | Status: DC

## 2015-07-27 NOTE — Progress Notes (Signed)
Subjective:    Patient ID: Dylan [name], male    DOB: 1950-12-20, 65 y.o.   MRN: [id number]  HPI  65 year old male, patient of Dr. Shawna Orleans. Who presents to the office today for lower back pain with numbness in his right leg that is now spreading to his left leg. He does have a history of spinal stenosis. He does have an appointment with in June with Dr. Valorie Roosevelt at [facility]. He denies any issues with bowel or bladder. He is taking Percocet for pain and feels as though this is helping  He also needs xanax and ambien refilled   Review of Systems  Constitutional: Positive for activity change.  Respiratory: Negative.   Cardiovascular: Negative.   Musculoskeletal: Positive for back pain and gait problem. Negative for myalgias, neck pain and neck stiffness.  Skin: Negative.   Neurological: Positive for numbness.   Past Medical History  Diagnosis Date  . Allergy     allergic rhinitis  . Hyperlipidemia   . History of ulcerative colitis     non recent flares  . History of melanoma 15 yrs ago    face and forehead-- previously followed by Derm in [place]  . Diabetes mellitus   . Hypertension   . Sleep apnea     last sleep study 10 yrs ago,CPAP  . Complication of anesthesia     trouble waking up with succycholine, does not have enzyme to break down succycholine  . Hepatitis age 89    history of infections Hepatitis type a  . Headache(784.0)   . Cancer ([place])     melonoma  . Dizziness     MRI brain unremarkable 02/05/2015.    Social History   Social History  . Marital Status: Married    Spouse Name: N/A  . Number of Children: N/A  . Years of Education: N/A   Occupational History  . Not on file.   Social History Main Topics  . Smoking status: Never Smoker   . Smokeless tobacco: Former Systems developer    Quit date: 02/26/2003  . Alcohol Use: No  . Drug Use: No  . Sexual Activity: Not on file   Other Topics Concern  . Not on file   Social History Narrative   Wife works at  cath lab at [employer] advanced breast cancer    Past Surgical History  Procedure Laterality Date  . Tonsillectomy  1969  . Knee surgery  yrs ago    right knee  . Anterior cervical decomp/discectomy fusion  10/15/2011    Procedure: ANTERIOR CERVICAL DECOMPRESSION/DISCECTOMY FUSION 1 LEVEL;  Surgeon: [name], MD;  Location: [facility];  Service: Neurosurgery;  Laterality: N/A;  Cervical four-five Anterior cervical decompression/diskectomy/fusion  . Acl and mensicus repair reconstruct Right 1980's  . Knee arthroscopy Left last done oct 2013    x 3  . Appendectomy  1969  . 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 Cancer Brother     brain tumor  . Diabetes Other   . Cancer Other     ovarian    Allergies  Allergen Reactions  . Vancomycin Hives    Ran off a pump. So developed redman's . Running too fast  . Heparin     REACTION: breathing problems  . Penicillins  REACTION: Rash  . Succinylcholine Chloride     REACTION: does not wake up from anesthetic    Current Outpatient Prescriptions on File Prior to Visit  Medication Sig Dispense Refill  . amLODipine (NORVASC) 5 MG tablet TAKE 1 TABLET BY MOUTH DAILY 90 tablet 3  . aspirin EC 81 MG tablet Take 1 tablet (81 mg total) by mouth daily.    . citalopram (CELEXA) 20 MG tablet TAKE 1 TABLET (20 MG TOTAL) BY MOUTH DAILY. 90 tablet 1  . co-enzyme Q-10 30 MG capsule Take 30 mg by mouth daily.    Marland Kitchen dutasteride (AVODART) 0.5 MG capsule Take 1 capsule (0.5 mg total) by mouth daily. 90 capsule 1  . esomeprazole (NEXIUM) 40 MG capsule TAKE 1 CAPSULE (40 MG TOTAL) BY MOUTH DAILY BEFORE BREAKFAST. 90 capsule 0  . finasteride (PROSCAR) 5 MG tablet Take 1 tablet (5 mg total) by mouth daily. 90 tablet 0  . HYDROcodone-homatropine (HYCODAN) 5-1.5 MG/5ML syrup  Take 5 mLs by mouth every 8 (eight) hours as needed for cough. 120 mL 0  . Melatonin 5 MG TABS Take by mouth.    . metFORMIN (GLUCOPHAGE) 500 MG tablet TAKE 1 TABLET BY MOUTH TWICE A DAY WITH A MEAL 180 tablet 1  . oxyCODONE-acetaminophen (PERCOCET) 10-325 MG tablet     . pravastatin (PRAVACHOL) 20 MG tablet TAKE 1 TABLET (20 MG TOTAL) BY MOUTH DAILY. 90 tablet 0  . predniSONE (DELTASONE) 10 MG tablet 40 mg x 3 days, 20 mg x 3 days, 10 mg x 3 days 21 tablet 0  . tamsulosin (FLOMAX) 0.4 MG CAPS capsule TAKE 1 CAPSULE (0.4 MG TOTAL) BY MOUTH DAILY. 90 capsule 3  . traMADol (ULTRAM) 50 MG tablet Take 50 mg by mouth every 6 (six) hours as needed.     . valsartan (DIOVAN) 160 MG tablet TAKE 1 TABLET BY MOUTH EVERY DAY 90 tablet 1   No current facility-administered medications on file prior to visit.    BP 104/58 mmHg  Temp(Src) 98.2 F (36.8 C) (Oral)  Wt 225 lb (102.059 kg)       Objective:   Physical Exam  Constitutional: He is oriented to person, place, and time. He appears well-developed and well-nourished. No distress.  Cardiovascular: Normal rate, regular rhythm, normal heart sounds and intact distal pulses.  Exam reveals no gallop and no friction rub.   No murmur heard. Pulmonary/Chest: Effort normal and breath sounds normal. No respiratory distress. He has no wheezes. He has no rales. He exhibits no tenderness.  Musculoskeletal: Normal range of motion. He exhibits no edema or tenderness.  Neurological: He is alert and oriented to person, place, and time. He has normal reflexes. He displays normal reflexes. No cranial nerve deficit. He exhibits normal muscle tone. Coordination normal.  Skin: Skin is warm and dry. No rash noted. He is not diaphoretic. No erythema. No pallor.  Psychiatric: He has a normal mood and affect. His behavior is normal. Thought content normal.  Vitals reviewed.     Assessment & Plan:  1. INSOMNIA - zolpidem (AMBIEN) 10 MG tablet; Take 1 tablet (10 mg  total) by mouth at bedtime.  Dispense: 30 tablet; Refill: 0  2. Generalized anxiety disorder - ALPRAZolam (XANAX) 0.25 MG tablet; TAKE 1 TABLET BY MOUTH TWICE A DAY AS NEEDED FOR ANXIETY  Dispense: 60 tablet; Refill: 0  3. Bilateral low back pain without sciatica - Keep appointment with neurosurgery  - Take pain medication as needed - Follow up  as needed

## 2015-07-27 NOTE — Patient Instructions (Signed)
It was great seeing you again today!  Good luck on June 15th and let me know if you need anything in the meantime  Please have the front desk get you an establish care visit with me

## 2015-08-14 ENCOUNTER — Encounter: Payer: Self-pay | Admitting: Adult Health

## 2015-08-14 ENCOUNTER — Other Ambulatory Visit: Payer: Self-pay | Admitting: Internal Medicine

## 2015-08-14 MED ORDER — OXYCODONE-ACETAMINOPHEN 10-325 MG PO TABS: 1.0000 | ORAL_TABLET | Freq: Three times a day (TID) | ORAL | Status: DC | PRN

## 2015-08-14 NOTE — Telephone Encounter (Signed)
I wrote for #[id number]

## 2015-08-14 NOTE — Telephone Encounter (Signed)
done

## 2015-08-18 ENCOUNTER — Ambulatory Visit (INDEPENDENT_AMBULATORY_CARE_PROVIDER_SITE_OTHER): Payer: Managed Care, Other (non HMO) | Admitting: Adult Health

## 2015-08-18 ENCOUNTER — Encounter: Payer: Self-pay | Admitting: Adult Health

## 2015-08-18 VITALS — BP 120/78 | HR 91 | Temp 98.3°F | Ht 71.0 in | Wt 223.0 lb

## 2015-08-18 DIAGNOSIS — T300 Burn of unspecified body region, unspecified degree: Secondary | ICD-10-CM

## 2015-08-18 DIAGNOSIS — T2104XA Burn of unspecified degree of lower back, initial encounter: Secondary | ICD-10-CM | POA: Diagnosis not present

## 2015-08-18 NOTE — Progress Notes (Signed)
Subjective:    Patient ID: Dylan [name], male    DOB: [birth year], 65 y.o.   MRN: [id number]  HPI  65 year old male, patient who presents to the office today for low back pain that he sustained from a thermal burn related to an ice pack. He reports that he has been icing his low back due to chronic pain. He is supposed to be having surgery with Dr. Jacqulyn Cane.   He wants to make sure that his burns are not infected and are healing well.   Review of Systems  Constitutional: Negative.   Respiratory: Negative.   Cardiovascular: Negative.   Skin: Positive for color change and wound.  All other systems reviewed and are negative.  Past Medical History  Diagnosis Date  . Allergy     allergic rhinitis  . Hyperlipidemia   . History of ulcerative colitis     non recent flares  . History of melanoma 15 yrs ago    face and forehead-- previously followed by Derm in [place]  . Diabetes mellitus   . Hypertension   . Sleep apnea     last sleep study 10 yrs ago,CPAP  . Complication of anesthesia     trouble waking up with succycholine, does not have enzyme to break down succycholine  . Hepatitis age 29    history of infections Hepatitis type a  . Headache(784.0)   . Cancer ([place])     melonoma  . Dizziness     MRI brain unremarkable 02/05/2015.    Social History   Social History  . Marital Status: Married    Spouse Name: N/A  . Number of Children: N/A  . Years of Education: N/A   Occupational History  . Not on file.   Social History Main Topics  . Smoking status: Never Smoker   . Smokeless tobacco: Former Systems developer    Quit date: 02/26/2003  . Alcohol Use: No  . Drug Use: No  . Sexual Activity: Not on file   Other Topics Concern  . Not on file   Social History Narrative   Wife works at cath lab at [employer] advanced breast cancer    Past Surgical History  Procedure Laterality Date  . Tonsillectomy  1969  . Knee surgery  yrs ago    right knee  . Anterior cervical  decomp/discectomy fusion  10/15/2011    Procedure: ANTERIOR CERVICAL DECOMPRESSION/DISCECTOMY FUSION 1 LEVEL;  Surgeon: [name], MD;  Location: [facility];  Service: Neurosurgery;  Laterality: N/A;  Cervical four-five Anterior cervical decompression/diskectomy/fusion  . Acl and mensicus repair reconstruct Right 1980's  . Knee arthroscopy Left last done oct 2013    x 3  . Appendectomy  1969  . 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 Cancer Brother     brain tumor  . Diabetes Other   . Cancer Other     ovarian    Allergies  Allergen Reactions  . Vancomycin Hives    Ran off a pump. So developed redman's . Running too fast  . Heparin     REACTION: breathing problems  . Penicillins     REACTION: Rash  . Succinylcholine Chloride     REACTION: does not wake up from anesthetic    Current Outpatient Prescriptions on File  Prior to Visit  Medication Sig Dispense Refill  . ALPRAZolam (XANAX) 0.25 MG tablet TAKE 1 TABLET BY MOUTH TWICE A DAY AS NEEDED FOR ANXIETY 60 tablet 0  . ALPRAZolam (XANAX) 0.25 MG tablet TAKE 1 TABLET BY MOUTH TWICE A DAY AS NEEDED FOR ANXIETY 60 tablet 0  . amLODipine (NORVASC) 5 MG tablet TAKE 1 TABLET BY MOUTH DAILY 90 tablet 3  . aspirin EC 81 MG tablet Take 1 tablet (81 mg total) by mouth daily.    . citalopram (CELEXA) 20 MG tablet TAKE 1 TABLET (20 MG TOTAL) BY MOUTH DAILY. 90 tablet 1  . co-enzyme Q-10 30 MG capsule Take 30 mg by mouth daily.    Marland Kitchen dutasteride (AVODART) 0.5 MG capsule Take 1 capsule (0.5 mg total) by mouth daily. 90 capsule 1  . esomeprazole (NEXIUM) 40 MG capsule TAKE 1 CAPSULE (40 MG TOTAL) BY MOUTH DAILY BEFORE BREAKFAST. 90 capsule 0  . finasteride (PROSCAR) 5 MG tablet TAKE 1 TABLET (5 MG TOTAL) BY MOUTH DAILY. 90 tablet 1  . gabapentin (NEURONTIN)  600 MG tablet Take 600 mg by mouth 3 (three) times daily.    Marland Kitchen HYDROcodone-homatropine (HYCODAN) 5-1.5 MG/5ML syrup Take 5 mLs by mouth every 8 (eight) hours as needed for cough. 120 mL 0  . Melatonin 5 MG TABS Take by mouth.    . metFORMIN (GLUCOPHAGE) 500 MG tablet TAKE 1 TABLET BY MOUTH TWICE A DAY WITH A MEAL 180 tablet 1  . oxyCODONE-acetaminophen (PERCOCET) 10-325 MG tablet Take 1 tablet by mouth every 8 (eight) hours as needed for pain. 30 tablet 0  . pravastatin (PRAVACHOL) 20 MG tablet TAKE 1 TABLET (20 MG TOTAL) BY MOUTH DAILY. 90 tablet 0  . tamsulosin (FLOMAX) 0.4 MG CAPS capsule TAKE 1 CAPSULE (0.4 MG TOTAL) BY MOUTH DAILY. 90 capsule 3  . valsartan (DIOVAN) 160 MG tablet TAKE 1 TABLET BY MOUTH EVERY DAY 90 tablet 1  . zolpidem (AMBIEN) 10 MG tablet Take 1 tablet (10 mg total) by mouth at bedtime. 30 tablet 0   No current facility-administered medications on file prior to visit.    BP 120/78 mmHg  Pulse 91  Temp(Src) 98.3 F (36.8 C)  Ht 5\' 11"  (1.803 m)  Wt 223 lb (101.152 kg)  BMI 31.12 kg/m2  SpO2 98%       Objective:   Physical Exam  Constitutional: He is oriented to person, place, and time. He appears well-developed and well-nourished. No distress.  Cardiovascular: Normal rate, regular rhythm, normal heart sounds and intact distal pulses.  Exam reveals no gallop and no friction rub.   No murmur heard. Pulmonary/Chest: Effort normal and breath sounds normal. No respiratory distress. He has no wheezes. He has no rales.  Neurological: He is alert and oriented to person, place, and time.  Skin: Skin is warm and dry. He is not diaphoretic.  Two small areas of thermal burn on lower back  Psychiatric: He has a normal mood and affect. His behavior is normal. Judgment and thought content normal.  Nursing note and vitals reviewed.     Assessment & Plan:  1. Thermal burn - Wounds appear well healing. No blistering noted.  - Advised to place a towel between skin and ice  pack - Continue to monitor at home and follow up as needed  Dorothyann Peng, NP

## 2015-08-19 ENCOUNTER — Other Ambulatory Visit: Payer: Self-pay | Admitting: Internal Medicine

## 2015-08-19 ENCOUNTER — Encounter: Payer: Self-pay | Admitting: Adult Health

## 2015-08-21 ENCOUNTER — Encounter: Payer: Self-pay | Admitting: Adult Health

## 2015-08-21 ENCOUNTER — Other Ambulatory Visit: Payer: Self-pay | Admitting: Neurological Surgery

## 2015-08-22 ENCOUNTER — Other Ambulatory Visit: Payer: Self-pay | Admitting: Adult Health

## 2015-08-22 MED ORDER — TRAMADOL HCL 50 MG PO TABS: ORAL_TABLET | ORAL | Status: DC

## 2015-08-26 ENCOUNTER — Other Ambulatory Visit: Payer: Self-pay | Admitting: Adult Health

## 2015-08-26 ENCOUNTER — Other Ambulatory Visit: Payer: Self-pay | Admitting: Internal Medicine

## 2015-08-26 ENCOUNTER — Other Ambulatory Visit: Payer: Self-pay | Admitting: Family Medicine

## 2015-08-27 ENCOUNTER — Encounter: Payer: Self-pay | Admitting: Adult Health

## 2015-08-28 ENCOUNTER — Other Ambulatory Visit: Payer: Self-pay | Admitting: Adult Health

## 2015-08-28 ENCOUNTER — Other Ambulatory Visit: Payer: Self-pay | Admitting: Internal Medicine

## 2015-08-28 ENCOUNTER — Other Ambulatory Visit: Payer: Self-pay | Admitting: Family Medicine

## 2015-08-28 ENCOUNTER — Encounter: Payer: Self-pay | Admitting: Adult Health

## 2015-08-28 DIAGNOSIS — G4700 Insomnia, unspecified: Secondary | ICD-10-CM

## 2015-08-28 DIAGNOSIS — F411 Generalized anxiety disorder: Secondary | ICD-10-CM

## 2015-08-29 NOTE — Telephone Encounter (Signed)
Ok to refill 

## 2015-08-30 ENCOUNTER — Other Ambulatory Visit: Payer: Self-pay | Admitting: Adult Health

## 2015-08-30 ENCOUNTER — Telehealth: Payer: Self-pay | Admitting: Internal Medicine

## 2015-08-30 ENCOUNTER — Encounter: Payer: Self-pay | Admitting: Adult Health

## 2015-08-30 ENCOUNTER — Other Ambulatory Visit: Payer: Self-pay | Admitting: Neurological Surgery

## 2015-08-30 ENCOUNTER — Other Ambulatory Visit: Payer: Self-pay | Admitting: Family Medicine

## 2015-08-30 ENCOUNTER — Other Ambulatory Visit: Payer: Self-pay | Admitting: Internal Medicine

## 2015-08-30 ENCOUNTER — Telehealth: Payer: Self-pay | Admitting: Adult Health

## 2015-08-30 MED ORDER — PRAVASTATIN SODIUM 20 MG PO TABS: 20.0000 mg | ORAL_TABLET | Freq: Every day | ORAL | Status: DC

## 2015-08-30 MED ORDER — ESOMEPRAZOLE MAGNESIUM 40 MG PO CPDR: DELAYED_RELEASE_CAPSULE | ORAL | Status: DC

## 2015-08-30 MED ORDER — TRAMADOL HCL (ER BIPHASIC) 200 MG PO CP24: 200.0000 mg | ORAL_CAPSULE | Freq: Every day | ORAL | Status: DC

## 2015-08-30 NOTE — Telephone Encounter (Signed)
Pt needs new rx oxycodone and tramadol send to [facility] [place]. Pt has a cpx sch with cory on 09-21-15

## 2015-08-30 NOTE — Telephone Encounter (Signed)
Ok to refill 

## 2015-08-30 NOTE — Telephone Encounter (Signed)
Zolpidem and Ambien were refilled today. Ok to refill Tramadol?

## 2015-08-30 NOTE — Telephone Encounter (Signed)
Spoke to patient about his pain management. He reports that his pain is getting worse ( is supposed to have back surgery on 7/18). He has been using Tramadol as directed during work and " it takes the edge off". Ice appears to be the best thing for him.   He needs more tramadol. I will call in Tramadol 200mg  ER x 30, 0 refills for patient.

## 2015-08-31 ENCOUNTER — Encounter: Payer: Self-pay | Admitting: Adult Health

## 2015-08-31 ENCOUNTER — Other Ambulatory Visit: Payer: Self-pay | Admitting: Family Medicine

## 2015-09-01 ENCOUNTER — Other Ambulatory Visit: Payer: Self-pay | Admitting: Adult Health

## 2015-09-01 ENCOUNTER — Encounter: Payer: Self-pay | Admitting: Adult Health

## 2015-09-01 ENCOUNTER — Other Ambulatory Visit: Payer: Self-pay | Admitting: Family Medicine

## 2015-09-04 ENCOUNTER — Telehealth: Payer: Self-pay

## 2015-09-04 NOTE — Telephone Encounter (Signed)
Patient called and wanted to know is he could est care with. You. Dr Shawna Orleans is leaving. Please advise.

## 2015-09-06 ENCOUNTER — Encounter: Payer: Self-pay | Admitting: Adult Health

## 2015-09-06 ENCOUNTER — Encounter (HOSPITAL_COMMUNITY)
Admission: RE | Admit: 2015-09-06 | Discharge: 2015-09-06 | Disposition: A | Discharge status: Home / Self Care | Payer: Managed Care, Other (non HMO) | Source: Ambulatory Visit | Attending: Neurological Surgery | Admitting: Neurological Surgery

## 2015-09-06 ENCOUNTER — Other Ambulatory Visit: Payer: Self-pay

## 2015-09-06 ENCOUNTER — Encounter (HOSPITAL_COMMUNITY): Payer: Self-pay

## 2015-09-06 DIAGNOSIS — Z01812 Encounter for preprocedural laboratory examination: Secondary | ICD-10-CM | POA: Insufficient documentation

## 2015-09-06 HISTORY — DX: Anxiety disorder, unspecified: F41.9

## 2015-09-06 HISTORY — DX: Unspecified osteoarthritis, unspecified site: M19.90

## 2015-09-06 HISTORY — DX: Gastro-esophageal reflux disease without esophagitis: K21.9

## 2015-09-06 LAB — CBC
HCT: 43 % (ref 39.0–52.0)
Hemoglobin: 14.3 g/dL (ref 13.0–17.0)
MCH: 28.8 pg (ref 26.0–34.0)
MCHC: 33.3 g/dL (ref 30.0–36.0)
MCV: 86.5 fL (ref 78.0–100.0)
PLATELETS: 280 10*3/uL (ref 150–400)
RBC: 4.97 MIL/uL (ref 4.22–5.81)
RDW: 13.1 % (ref 11.5–15.5)
WBC: 9 10*3/uL (ref 4.0–10.5)

## 2015-09-06 LAB — BASIC METABOLIC PANEL
Anion gap: 9 (ref 5–15)
BUN: 15 mg/dL (ref 6–20)
CALCIUM: 9.5 mg/dL (ref 8.9–10.3)
CO2: 26 mmol/L (ref 22–32)
CREATININE: 0.94 mg/dL (ref 0.61–1.24)
Chloride: 103 mmol/L (ref 101–111)
Glucose, Bld: 189 mg/dL — ABNORMAL HIGH (ref 65–99)
Potassium: 3.7 mmol/L (ref 3.5–5.1)
SODIUM: 138 mmol/L (ref 135–145)

## 2015-09-06 LAB — SURGICAL PCR SCREEN
MRSA, PCR: POSITIVE — AB
STAPHYLOCOCCUS AUREUS: POSITIVE — AB

## 2015-09-06 LAB — GLUCOSE, CAPILLARY: GLUCOSE-CAPILLARY: 109 mg/dL — AB (ref 65–99)

## 2015-09-06 MED ORDER — CHLORHEXIDINE GLUCONATE CLOTH 2 % EX PADS: 6.0000 | MEDICATED_PAD | Freq: Once | CUTANEOUS | Status: DC

## 2015-09-06 MED ORDER — TRAMADOL HCL 50 MG PO TABS: ORAL_TABLET | ORAL | Status: DC

## 2015-09-06 NOTE — Progress Notes (Signed)
Mupirocin Ointment called into [facility] in [place] for positive PCR of MRSA and Staph. Pt notified and voiced understanding.

## 2015-09-06 NOTE — Telephone Encounter (Signed)
Very sorry, I am unable at this time, thanks

## 2015-09-06 NOTE — Progress Notes (Signed)
Pt. Followed by Velora Heckler PCP, but also reports that he had a cardiology visit with Dr. Harrington Challenger in 2013 pre TKR- no stress test done at that time. Pt. Reports that his Succycholine adverse reaction was post sinus surgery in the 1980's

## 2015-09-06 NOTE — Pre-Procedure Instructions (Addendum)
Dylan [name]  09/06/2015      [id number] - [place], [place] - [address] [place] [address] Phone: [phone] Fax: [phone]    Your procedure is scheduled on 09/12/2015.  Report to [facility] Admitting at 10:30 A.M.  Call this number if you have problems the morning of surgery:  [phone]   Remember:  Do not eat food or drink liquids after midnight.  On Monday   Take these medicines the morning of surgery with A SIP OF WATER : xanax if needed, in addition take AMLODIPINE, CITALOPRAM, NEXIUM, GABAPENTIN, TRAMADOL   Do not wear jewelry   Do not wear lotions, powders, or perfumes.  You may wear deoderant.   Men may shave face and neck.   Do not bring valuables to the hospital.   [facility] is not responsible for any belongings or valuables.  Contacts, dentures or bridgework may not be worn into surgery.  Leave your suitcase in the car.  After surgery it may be brought to your room.  For patients admitted to the hospital, discharge time will be determined by your treatment team.  Patients discharged the day of surgery will not be allowed to drive home.   Name and phone number of your driver:   With family  Special instructions:  Special Instructions: [place] - Preparing for Surgery  Before surgery, you can play an important role.  Because skin is not sterile, your skin needs to be as free of germs as possible.  You can reduce the number of germs on you skin by washing with CHG (chlorahexidine gluconate) soap before surgery.  CHG is an antiseptic cleaner which kills germs and bonds with the skin to continue killing germs even after washing.  Please DO NOT use if you have an allergy to CHG or antibacterial soaps.  If your skin becomes reddened/irritated stop using the CHG and inform your nurse when you arrive at Short Stay.  Do not shave (including legs and underarms) for at  least 48 hours prior to the first CHG shower.  You may shave your face.  Please follow these instructions carefully:   1.  Shower with CHG Soap the night before surgery and the  morning of Surgery.  2.  If you choose to wash your hair, wash your hair first as usual with your  normal shampoo.  3.  After you shampoo, rinse your hair and body thoroughly to remove the  Shampoo.  4.  Use CHG as you would any other liquid soap.  You can apply chg directly to the skin and wash gently with scrungie or a clean washcloth.  5.  Apply the CHG Soap to your body ONLY FROM THE NECK DOWN.    Do not use on open wounds or open sores.  Avoid contact with your eyes, ears, mouth and genitals (private parts).  Wash genitals (private parts)   with your normal soap.  6.  Wash thoroughly, paying special attention to the area where your surgery will be performed.  7.  Thoroughly rinse your body with warm water from the neck down.  8.  DO NOT shower/wash with your normal soap after using and rinsing off   the CHG Soap.  9.  Pat yourself dry with a clean towel.            10.  Wear clean pajamas.  11.  Place clean sheets on your bed the night of your first shower and do not sleep with pets.  Day of Surgery  Do not apply any lotions/deodorants the morning of surgery.  Please wear clean clothes to the hospital/surgery center.  Please read over the following fact sheets that you were given. Pain Booklet, Coughing and Deep Breathing, MRSA Information and Surgical Site Infection Prevention        How to Manage Your Diabetes Before and After Surgery  Why is it important to control my blood sugar before and after surgery? . Improving blood sugar levels before and after surgery helps healing and can limit problems. . A way of improving blood sugar control is eating a healthy diet by: o  Eating less sugar and carbohydrates o  Increasing activity/exercise o  Talking with your doctor about reaching your blood  sugar goals . High blood sugars (greater than 180 mg/dL) can raise your risk of infections and slow your recovery, so you will need to focus on controlling your diabetes during the weeks before surgery. . Make sure that the doctor who takes care of your diabetes knows about your planned surgery including the date and location.  How do I manage my blood sugar before surgery? . Check your blood sugar at least 4 times a day, starting 2 days before surgery, to make sure that the level is not too high or low. o Check your blood sugar the morning of your surgery when you wake up and every 2 hours until you get to the Short Stay unit. . If your blood sugar is less than 70 mg/dL, you will need to treat for low blood sugar: o Do not take insulin. o Treat a low blood sugar (less than 70 mg/dL) with  cup of clear juice (cranberry or apple), 4 glucose tablets, OR glucose gel. o Recheck blood sugar in 15 minutes after treatment (to make sure it is greater than 70 mg/dL). If your blood sugar is not greater than 70 mg/dL on recheck, call [phone] for further instructions. . Report your blood sugar to the short stay nurse when you get to Short Stay.  . If you are admitted to the hospital after surgery: o Your blood sugar will be checked by the staff and you will probably be given insulin after surgery (instead of oral diabetes medicines) to make sure you have good blood sugar levels. o The goal for blood sugar control after surgery is 80-180 mg/dL.              WHAT DO I DO ABOUT MY DIABETES MEDICATION?   Marland Kitchen Do not take oral diabetes medicines (pills) the morning of surgery.  .     .    .   Other Instructions:          Patient Signature:  Date:   Nurse Signature:  Date:   Reviewed and Endorsed by [facility] Patient Education Committee, August 2015

## 2015-09-06 NOTE — Telephone Encounter (Signed)
Please advise 

## 2015-09-07 ENCOUNTER — Encounter: Payer: Self-pay | Admitting: Adult Health

## 2015-09-07 LAB — HEMOGLOBIN A1C
Hgb A1c MFr Bld: 6.2 % — ABNORMAL HIGH (ref 4.8–5.6)
Mean Plasma Glucose: 131 mg/dL

## 2015-09-07 NOTE — Progress Notes (Signed)
Anesthesia Chart Review:   Pt is a 65 year old male scheduled for L3-4, L4-5 laminectomy with coflex on 09/12/2015 with [name], MD.   PMH includes:  HTN, DM, hyperlipidemia, OSA, melanoma, hepatitis (age 58), GERD. Never smoker. BMI 31. 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 S/p ACDF 10/15/11.   Anesthesia history: Pt is allergic to succinylcholine.   Medications include: amlodipine, ASA, nexium, metformin, pravastatin, valsartan  Preoperative labs reviewed.  HgbA1c 6.2, glucose 189  Chest x-ray 06/29/15 reviewed. No active cardiopulmonary disease  EKG 01/30/15: Sinus  Rhythm. Incomplete RBBB and LAFB.   If no changes, I anticipate pt can proceed with surgery as scheduled.   Willeen Cass, FNP-BC [facility] Stay [facility]/Anesthesiology Phone: [phone] 09/07/2015 11:27 AM

## 2015-09-07 NOTE — Telephone Encounter (Signed)
Called patient and informed that Dr. Jenny Reichmann can not take on anymore patients at this time.

## 2015-09-11 ENCOUNTER — Other Ambulatory Visit: Payer: Self-pay | Admitting: Family Medicine

## 2015-09-11 ENCOUNTER — Encounter: Payer: Self-pay | Admitting: Adult Health

## 2015-09-12 ENCOUNTER — Inpatient Hospital Stay (HOSPITAL_COMMUNITY)
Admission: RE | Admit: 2015-09-12 | Payer: Managed Care, Other (non HMO) | Source: Ambulatory Visit | Admitting: Neurological Surgery

## 2015-09-12 ENCOUNTER — Encounter (HOSPITAL_COMMUNITY): Admission: RE | Payer: Self-pay | Source: Ambulatory Visit

## 2015-09-12 ENCOUNTER — Encounter (HOSPITAL_COMMUNITY): Payer: Self-pay | Admitting: Surgery

## 2015-09-12 ENCOUNTER — Ambulatory Visit (HOSPITAL_COMMUNITY): Payer: Managed Care, Other (non HMO)

## 2015-09-12 ENCOUNTER — Other Ambulatory Visit: Payer: Self-pay | Admitting: Adult Health

## 2015-09-12 ENCOUNTER — Ambulatory Visit (HOSPITAL_COMMUNITY): Payer: Managed Care, Other (non HMO) | Admitting: Emergency Medicine

## 2015-09-12 ENCOUNTER — Observation Stay (HOSPITAL_COMMUNITY)
Admission: RE | Admit: 2015-09-12 | Discharge: 2015-09-13 | Disposition: A | Discharge status: Home / Self Care | Payer: Managed Care, Other (non HMO) | Source: Ambulatory Visit | Attending: Neurological Surgery | Admitting: Neurological Surgery

## 2015-09-12 ENCOUNTER — Encounter (HOSPITAL_COMMUNITY)
Admission: RE | Disposition: A | Discharge status: Home / Self Care | Payer: Self-pay | Source: Ambulatory Visit | Attending: Neurological Surgery

## 2015-09-12 DIAGNOSIS — K219 Gastro-esophageal reflux disease without esophagitis: Secondary | ICD-10-CM | POA: Insufficient documentation

## 2015-09-12 DIAGNOSIS — Z79899 Other long term (current) drug therapy: Secondary | ICD-10-CM | POA: Diagnosis not present

## 2015-09-12 DIAGNOSIS — Z7982 Long term (current) use of aspirin: Secondary | ICD-10-CM | POA: Insufficient documentation

## 2015-09-12 DIAGNOSIS — Z96652 Presence of left artificial knee joint: Secondary | ICD-10-CM | POA: Diagnosis not present

## 2015-09-12 DIAGNOSIS — E785 Hyperlipidemia, unspecified: Secondary | ICD-10-CM | POA: Diagnosis not present

## 2015-09-12 DIAGNOSIS — F419 Anxiety disorder, unspecified: Secondary | ICD-10-CM | POA: Diagnosis not present

## 2015-09-12 DIAGNOSIS — M5116 Intervertebral disc disorders with radiculopathy, lumbar region: Secondary | ICD-10-CM | POA: Diagnosis not present

## 2015-09-12 DIAGNOSIS — Z7984 Long term (current) use of oral hypoglycemic drugs: Secondary | ICD-10-CM | POA: Insufficient documentation

## 2015-09-12 DIAGNOSIS — I10 Essential (primary) hypertension: Secondary | ICD-10-CM | POA: Diagnosis not present

## 2015-09-12 DIAGNOSIS — G4733 Obstructive sleep apnea (adult) (pediatric): Secondary | ICD-10-CM | POA: Insufficient documentation

## 2015-09-12 DIAGNOSIS — M4806 Spinal stenosis, lumbar region: Principal | ICD-10-CM | POA: Insufficient documentation

## 2015-09-12 DIAGNOSIS — M4726 Other spondylosis with radiculopathy, lumbar region: Secondary | ICD-10-CM | POA: Diagnosis not present

## 2015-09-12 DIAGNOSIS — E119 Type 2 diabetes mellitus without complications: Secondary | ICD-10-CM | POA: Diagnosis not present

## 2015-09-12 DIAGNOSIS — M48062 Spinal stenosis, lumbar region with neurogenic claudication: Secondary | ICD-10-CM | POA: Diagnosis present

## 2015-09-12 DIAGNOSIS — Z419 Encounter for procedure for purposes other than remedying health state, unspecified: Secondary | ICD-10-CM

## 2015-09-12 DIAGNOSIS — Z981 Arthrodesis status: Secondary | ICD-10-CM | POA: Insufficient documentation

## 2015-09-12 HISTORY — PX: LUMBAR LAMINECTOMY WITH COFLEX 2 LEVEL: SHX6515

## 2015-09-12 LAB — GLUCOSE, CAPILLARY
GLUCOSE-CAPILLARY: 215 mg/dL — AB (ref 65–99)
Glucose-Capillary: 117 mg/dL — ABNORMAL HIGH (ref 65–99)
Glucose-Capillary: 123 mg/dL — ABNORMAL HIGH (ref 65–99)

## 2015-09-12 SURGERY — LUMBAR LAMINECTOMY WITH COFLEX 2 LEVEL
Anesthesia: General | Site: Back

## 2015-09-12 MED ORDER — FENTANYL CITRATE (PF) 250 MCG/5ML IJ SOLN
INTRAMUSCULAR | Status: AC
  Filled 2015-09-12: qty 5

## 2015-09-12 MED ORDER — CITALOPRAM HYDROBROMIDE 20 MG PO TABS
20.0000 mg | ORAL_TABLET | Freq: Every day | ORAL | Status: DC
  Administered 2015-09-13: 20 mg via ORAL
  Filled 2015-09-12: qty 1

## 2015-09-12 MED ORDER — OXYCODONE-ACETAMINOPHEN 10-325 MG PO TABS: 1.0000 | ORAL_TABLET | Freq: Three times a day (TID) | ORAL | Status: DC | PRN

## 2015-09-12 MED ORDER — LIDOCAINE HCL (CARDIAC) 20 MG/ML IV SOLN
INTRAVENOUS | Status: DC | PRN
  Administered 2015-09-12: 50 mg via INTRAVENOUS

## 2015-09-12 MED ORDER — IRBESARTAN 150 MG PO TABS
150.0000 mg | ORAL_TABLET | Freq: Every day | ORAL | Status: DC
  Administered 2015-09-13: 150 mg via ORAL
  Filled 2015-09-12: qty 1

## 2015-09-12 MED ORDER — SODIUM CHLORIDE 0.9% FLUSH
3.0000 mL | Freq: Two times a day (BID) | INTRAVENOUS | Status: DC
  Administered 2015-09-12: 3 mL via INTRAVENOUS

## 2015-09-12 MED ORDER — SODIUM CHLORIDE 0.9 % IV SOLN: 250.0000 mL | INTRAVENOUS | Status: DC

## 2015-09-12 MED ORDER — FINASTERIDE 5 MG PO TABS
5.0000 mg | ORAL_TABLET | Freq: Every day | ORAL | Status: DC
  Administered 2015-09-12 – 2015-09-13 (×2): 5 mg via ORAL
  Filled 2015-09-12 (×2): qty 1

## 2015-09-12 MED ORDER — PROMETHAZINE HCL 25 MG/ML IJ SOLN: 6.2500 mg | INTRAMUSCULAR | Status: DC | PRN

## 2015-09-12 MED ORDER — VANCOMYCIN HCL IN DEXTROSE 1-5 GM/200ML-% IV SOLN
INTRAVENOUS | Status: AC
  Administered 2015-09-12: 1000 mg via INTRAVENOUS
  Filled 2015-09-12: qty 200

## 2015-09-12 MED ORDER — ALPRAZOLAM 0.25 MG PO TABS: 0.2500 mg | ORAL_TABLET | Freq: Two times a day (BID) | ORAL | Status: DC | PRN

## 2015-09-12 MED ORDER — FENTANYL CITRATE (PF) 100 MCG/2ML IJ SOLN
INTRAMUSCULAR | Status: DC | PRN
  Administered 2015-09-12 (×2): 50 ug via INTRAVENOUS
  Administered 2015-09-12: 100 ug via INTRAVENOUS
  Administered 2015-09-12: 150 ug via INTRAVENOUS

## 2015-09-12 MED ORDER — LACTATED RINGERS IV SOLN
INTRAVENOUS | Status: DC | PRN
  Administered 2015-09-12 (×3): via INTRAVENOUS

## 2015-09-12 MED ORDER — LIDOCAINE-EPINEPHRINE 1 %-1:100000 IJ SOLN
INTRAMUSCULAR | Status: DC | PRN
  Administered 2015-09-12: 4 mL

## 2015-09-12 MED ORDER — PHENOL 1.4 % MT LIQD
1.0000 | OROMUCOSAL | Status: DC | PRN
Start: 2015-09-12 — End: 2015-09-13

## 2015-09-12 MED ORDER — ACETAMINOPHEN 325 MG PO TABS: 650.0000 mg | ORAL_TABLET | ORAL | Status: DC | PRN

## 2015-09-12 MED ORDER — ZOLPIDEM TARTRATE 5 MG PO TABS
10.0000 mg | ORAL_TABLET | Freq: Every day | ORAL | Status: DC
  Administered 2015-09-12: 10 mg via ORAL
  Filled 2015-09-12: qty 2

## 2015-09-12 MED ORDER — AMLODIPINE BESYLATE 5 MG PO TABS
5.0000 mg | ORAL_TABLET | Freq: Every day | ORAL | Status: DC
  Administered 2015-09-13: 5 mg via ORAL
  Filled 2015-09-12: qty 1

## 2015-09-12 MED ORDER — ONDANSETRON HCL 4 MG/2ML IJ SOLN: 4.0000 mg | INTRAMUSCULAR | Status: DC | PRN

## 2015-09-12 MED ORDER — LIDOCAINE 2% (20 MG/ML) 5 ML SYRINGE
INTRAMUSCULAR | Status: AC
  Filled 2015-09-12: qty 5

## 2015-09-12 MED ORDER — BUPIVACAINE HCL (PF) 0.5 % IJ SOLN
INTRAMUSCULAR | Status: DC | PRN
  Administered 2015-09-12: 4 mL

## 2015-09-12 MED ORDER — BISACODYL 10 MG RE SUPP: 10.0000 mg | Freq: Every day | RECTAL | Status: DC | PRN

## 2015-09-12 MED ORDER — THROMBIN 5000 UNITS EX SOLR
CUTANEOUS | Status: DC | PRN
  Administered 2015-09-12 (×2): 5000 [IU] via TOPICAL

## 2015-09-12 MED ORDER — SODIUM CHLORIDE 0.9% FLUSH: 3.0000 mL | INTRAVENOUS | Status: DC | PRN

## 2015-09-12 MED ORDER — PHENYLEPHRINE 40 MCG/ML (10ML) SYRINGE FOR IV PUSH (FOR BLOOD PRESSURE SUPPORT)
PREFILLED_SYRINGE | INTRAVENOUS | Status: AC
  Filled 2015-09-12: qty 10

## 2015-09-12 MED ORDER — GABAPENTIN 600 MG PO TABS
600.0000 mg | ORAL_TABLET | Freq: Three times a day (TID) | ORAL | Status: DC
  Administered 2015-09-12 – 2015-09-13 (×2): 600 mg via ORAL
  Filled 2015-09-12 (×2): qty 1

## 2015-09-12 MED ORDER — PROPOFOL 10 MG/ML IV BOLUS
INTRAVENOUS | Status: AC
  Filled 2015-09-12: qty 40

## 2015-09-12 MED ORDER — OXYCODONE-ACETAMINOPHEN 5-325 MG PO TABS
1.0000 | ORAL_TABLET | ORAL | Status: DC | PRN
  Administered 2015-09-12 – 2015-09-13 (×4): 2 via ORAL
  Filled 2015-09-12 (×4): qty 2

## 2015-09-12 MED ORDER — PROPOFOL 10 MG/ML IV BOLUS
INTRAVENOUS | Status: DC | PRN
Start: 2015-09-12 — End: 2015-09-12
  Administered 2015-09-12: 170 mg via INTRAVENOUS

## 2015-09-12 MED ORDER — ROCURONIUM BROMIDE 50 MG/5ML IV SOLN
INTRAVENOUS | Status: AC
  Filled 2015-09-12: qty 2

## 2015-09-12 MED ORDER — HYDROMORPHONE HCL 1 MG/ML IJ SOLN
INTRAMUSCULAR | Status: AC
  Filled 2015-09-12: qty 1

## 2015-09-12 MED ORDER — CHLORHEXIDINE GLUCONATE CLOTH 2 % EX PADS: 6.0000 | MEDICATED_PAD | Freq: Once | CUTANEOUS | Status: DC

## 2015-09-12 MED ORDER — METFORMIN HCL 500 MG PO TABS
500.0000 mg | ORAL_TABLET | Freq: Two times a day (BID) | ORAL | Status: DC
  Administered 2015-09-13: 500 mg via ORAL
  Filled 2015-09-12: qty 1

## 2015-09-12 MED ORDER — DOCUSATE SODIUM 100 MG PO CAPS
100.0000 mg | ORAL_CAPSULE | Freq: Two times a day (BID) | ORAL | Status: DC
  Administered 2015-09-12 – 2015-09-13 (×2): 100 mg via ORAL
  Filled 2015-09-12 (×2): qty 1

## 2015-09-12 MED ORDER — EPHEDRINE 5 MG/ML INJ
INTRAVENOUS | Status: AC
  Filled 2015-09-12: qty 10

## 2015-09-12 MED ORDER — ONDANSETRON HCL 4 MG/2ML IJ SOLN
INTRAMUSCULAR | Status: DC | PRN
  Administered 2015-09-12: 4 mg via INTRAVENOUS

## 2015-09-12 MED ORDER — DUTASTERIDE 0.5 MG PO CAPS
0.5000 mg | ORAL_CAPSULE | Freq: Every day | ORAL | Status: DC
  Administered 2015-09-12: 0.5 mg via ORAL
  Filled 2015-09-12: qty 1

## 2015-09-12 MED ORDER — MENTHOL 3 MG MT LOZG
1.0000 | LOZENGE | OROMUCOSAL | Status: DC | PRN
Start: 2015-09-12 — End: 2015-09-13

## 2015-09-12 MED ORDER — ALUM & MAG HYDROXIDE-SIMETH 200-200-20 MG/5ML PO SUSP: 30.0000 mL | Freq: Four times a day (QID) | ORAL | Status: DC | PRN

## 2015-09-12 MED ORDER — PRAVASTATIN SODIUM 40 MG PO TABS
20.0000 mg | ORAL_TABLET | Freq: Every day | ORAL | Status: DC
  Administered 2015-09-13: 20 mg via ORAL
  Filled 2015-09-12: qty 1

## 2015-09-12 MED ORDER — SENNA 8.6 MG PO TABS
1.0000 | ORAL_TABLET | Freq: Two times a day (BID) | ORAL | Status: DC
  Administered 2015-09-12 – 2015-09-13 (×2): 8.6 mg via ORAL
  Filled 2015-09-12 (×2): qty 1

## 2015-09-12 MED ORDER — METHOCARBAMOL 1000 MG/10ML IJ SOLN
500.0000 mg | Freq: Four times a day (QID) | INTRAVENOUS | Status: DC | PRN
  Filled 2015-09-12: qty 5

## 2015-09-12 MED ORDER — KETOROLAC TROMETHAMINE 15 MG/ML IJ SOLN
15.0000 mg | Freq: Four times a day (QID) | INTRAMUSCULAR | Status: DC
Start: 2015-09-12 — End: 2015-09-13
  Administered 2015-09-12 – 2015-09-13 (×3): 15 mg via INTRAVENOUS
  Filled 2015-09-12: qty 1

## 2015-09-12 MED ORDER — EPHEDRINE SULFATE 50 MG/ML IJ SOLN
INTRAMUSCULAR | Status: DC | PRN
  Administered 2015-09-12: 10 mg via INTRAVENOUS

## 2015-09-12 MED ORDER — LACTATED RINGERS IV SOLN
INTRAVENOUS | Status: DC
  Administered 2015-09-12: 11:00:00 via INTRAVENOUS

## 2015-09-12 MED ORDER — MIDAZOLAM HCL 5 MG/5ML IJ SOLN
INTRAMUSCULAR | Status: DC | PRN
  Administered 2015-09-12: 2 mg via INTRAVENOUS

## 2015-09-12 MED ORDER — HEMOSTATIC AGENTS (NO CHARGE) OPTIME
TOPICAL | Status: DC | PRN
  Administered 2015-09-12: 1 via TOPICAL

## 2015-09-12 MED ORDER — HYDROMORPHONE HCL 1 MG/ML IJ SOLN
0.2500 mg | INTRAMUSCULAR | Status: DC | PRN
  Administered 2015-09-12 (×4): 0.5 mg via INTRAVENOUS

## 2015-09-12 MED ORDER — ROCURONIUM BROMIDE 100 MG/10ML IV SOLN
INTRAVENOUS | Status: DC | PRN
Start: 2015-09-12 — End: 2015-09-12
  Administered 2015-09-12: 50 mg via INTRAVENOUS
  Administered 2015-09-12 (×2): 10 mg via INTRAVENOUS

## 2015-09-12 MED ORDER — PANTOPRAZOLE SODIUM 40 MG PO TBEC
40.0000 mg | DELAYED_RELEASE_TABLET | Freq: Every day | ORAL | Status: DC
  Administered 2015-09-13: 40 mg via ORAL
  Filled 2015-09-12: qty 1

## 2015-09-12 MED ORDER — MIDAZOLAM HCL 2 MG/2ML IJ SOLN
INTRAMUSCULAR | Status: AC
  Filled 2015-09-12: qty 2

## 2015-09-12 MED ORDER — BACITRACIN 50000 UNITS IM SOLR
INTRAMUSCULAR | Status: DC | PRN
  Administered 2015-09-12: 500 mL

## 2015-09-12 MED ORDER — POLYETHYLENE GLYCOL 3350 17 G PO PACK: 17.0000 g | PACK | Freq: Every day | ORAL | Status: DC | PRN

## 2015-09-12 MED ORDER — MORPHINE SULFATE (PF) 2 MG/ML IV SOLN: 1.0000 mg | INTRAVENOUS | Status: DC | PRN

## 2015-09-12 MED ORDER — SUGAMMADEX SODIUM 200 MG/2ML IV SOLN
INTRAVENOUS | Status: DC | PRN
  Administered 2015-09-12: 201.4 mg via INTRAVENOUS

## 2015-09-12 MED ORDER — FLEET ENEMA 7-19 GM/118ML RE ENEM: 1.0000 | ENEMA | Freq: Once | RECTAL | Status: DC | PRN

## 2015-09-12 MED ORDER — 0.9 % SODIUM CHLORIDE (POUR BTL) OPTIME
TOPICAL | Status: DC | PRN
  Administered 2015-09-12: 1000 mL

## 2015-09-12 MED ORDER — METHOCARBAMOL 500 MG PO TABS
500.0000 mg | ORAL_TABLET | Freq: Four times a day (QID) | ORAL | Status: DC | PRN
  Administered 2015-09-12 – 2015-09-13 (×2): 500 mg via ORAL
  Filled 2015-09-12: qty 1

## 2015-09-12 MED ORDER — ACETAMINOPHEN 650 MG RE SUPP: 650.0000 mg | RECTAL | Status: DC | PRN

## 2015-09-12 SURGICAL SUPPLY — 56 items
BAG DECANTER FOR FLEXI CONT (MISCELLANEOUS) ×3 IMPLANT
BLADE CLIPPER SURG (BLADE) IMPLANT
BUR ACORN 6.0 (BURR) IMPLANT
BUR ACORN 6.0MM (BURR)
BUR MATCHSTICK NEURO 3.0 LAGG (BURR) ×3 IMPLANT
CANISTER SUCT 3000ML PPV (MISCELLANEOUS) ×3 IMPLANT
DECANTER SPIKE VIAL GLASS SM (MISCELLANEOUS) ×3 IMPLANT
DERMABOND ADVANCED (GAUZE/BANDAGES/DRESSINGS) ×2
DERMABOND ADVANCED .7 DNX12 (GAUZE/BANDAGES/DRESSINGS) ×1 IMPLANT
DEVICE COFLEX STABLIZATION 10M (Neuro Prosthesis/Implant) ×3 IMPLANT
DEVICE COFLEX STABLIZATION 8MM (Neuro Prosthesis/Implant) ×3 IMPLANT
DEVICE DISSECT PLASMABLAD 3.0S (MISCELLANEOUS) ×1 IMPLANT
DRAPE C-ARM 42X72 X-RAY (DRAPES) ×6 IMPLANT
DRAPE LAPAROTOMY T 102X78X121 (DRAPES) ×3 IMPLANT
DRAPE MICROSCOPE LEICA (MISCELLANEOUS) IMPLANT
DRAPE POUCH INSTRU U-SHP 10X18 (DRAPES) ×3 IMPLANT
DRAPE PROXIMA HALF (DRAPES) IMPLANT
DURAPREP 26ML APPLICATOR (WOUND CARE) ×3 IMPLANT
ELECT REM PT RETURN 9FT ADLT (ELECTROSURGICAL) ×3
ELECTRODE REM PT RTRN 9FT ADLT (ELECTROSURGICAL) ×1 IMPLANT
GAUZE SPONGE 4X4 12PLY STRL (GAUZE/BANDAGES/DRESSINGS) ×3 IMPLANT
GAUZE SPONGE 4X4 16PLY XRAY LF (GAUZE/BANDAGES/DRESSINGS) IMPLANT
GLOVE BIOGEL PI IND STRL 7.5 (GLOVE) ×1 IMPLANT
GLOVE BIOGEL PI IND STRL 8.5 (GLOVE) ×1 IMPLANT
GLOVE BIOGEL PI INDICATOR 7.5 (GLOVE) ×2
GLOVE BIOGEL PI INDICATOR 8.5 (GLOVE) ×2
GLOVE ECLIPSE 8.5 STRL (GLOVE) ×3 IMPLANT
GLOVE EXAM NITRILE LRG STRL (GLOVE) IMPLANT
GLOVE EXAM NITRILE MD LF STRL (GLOVE) IMPLANT
GLOVE EXAM NITRILE XL STR (GLOVE) IMPLANT
GLOVE EXAM NITRILE XS STR PU (GLOVE) IMPLANT
GLOVE SS BIOGEL STRL SZ 7 (GLOVE) ×1 IMPLANT
GLOVE SUPERSENSE BIOGEL SZ 7 (GLOVE) ×2
GOWN STRL REUS W/ TWL LRG LVL3 (GOWN DISPOSABLE) IMPLANT
GOWN STRL REUS W/ TWL XL LVL3 (GOWN DISPOSABLE) IMPLANT
GOWN STRL REUS W/TWL 2XL LVL3 (GOWN DISPOSABLE) ×3 IMPLANT
GOWN STRL REUS W/TWL LRG LVL3 (GOWN DISPOSABLE)
GOWN STRL REUS W/TWL XL LVL3 (GOWN DISPOSABLE)
KIT BASIN OR (CUSTOM PROCEDURE TRAY) ×3 IMPLANT
KIT ROOM TURNOVER OR (KITS) ×3 IMPLANT
NEEDLE HYPO 22GX1.5 SAFETY (NEEDLE) ×3 IMPLANT
NEEDLE SPNL 20GX3.5 QUINCKE YW (NEEDLE) IMPLANT
NS IRRIG 1000ML POUR BTL (IV SOLUTION) ×3 IMPLANT
PACK LAMINECTOMY NEURO (CUSTOM PROCEDURE TRAY) ×3 IMPLANT
PAD ARMBOARD 7.5X6 YLW CONV (MISCELLANEOUS) ×9 IMPLANT
PATTIES SURGICAL .5 X1 (DISPOSABLE) ×3 IMPLANT
PLASMABLADE 3.0S (MISCELLANEOUS) ×3
RUBBERBAND STERILE (MISCELLANEOUS) IMPLANT
SPONGE SURGIFOAM ABS GEL SZ50 (HEMOSTASIS) ×3 IMPLANT
SUT VIC AB 1 CT1 18XBRD ANBCTR (SUTURE) ×1 IMPLANT
SUT VIC AB 1 CT1 8-18 (SUTURE) ×2
SUT VIC AB 2-0 CP2 18 (SUTURE) ×3 IMPLANT
SUT VIC AB 3-0 SH 8-18 (SUTURE) ×3 IMPLANT
TOWEL OR 17X24 6PK STRL BLUE (TOWEL DISPOSABLE) ×3 IMPLANT
TOWEL OR 17X26 10 PK STRL BLUE (TOWEL DISPOSABLE) ×3 IMPLANT
WATER STERILE IRR 1000ML POUR (IV SOLUTION) ×3 IMPLANT

## 2015-09-12 NOTE — H&P (Signed)
CHIEF COMPLAINT:                                          Back and bilateral lower extremity pain and discomfort for over a year.  HISTORY OF PRESENT ILLNESS:                     Dylan [name] is a 65 year old, right-handed, individual whom I have seen in the past. 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 He notes that for a year's period of time, he has been having a progressive deterioration in his ability to walk. He has had increasing pain in his back and in his legs, both. He has been seen and treated by Dr. Suella Broad, and he has had some epidural steroid injections, which at most have given him relief for about a week's period of time. Nonetheless, he has had continued problems with worsening pain, and over time, he has developed some reliance on narcotic analgesics in the form of Percocet. He notes that he has given up golf because of the degree of discomfort that he experiences into both lower extremities. He notes typically that the symptoms started mostly in his right leg at first but then gradually tended to involve both, and he notes that both of his legs are involved now. He has had previous knee surgery with a total knee replacement on the left and a meniscus repair years ago on the right side. Nonetheless, he is getting pain and stiffness into both his lower extremities that is quite severe and unrelenting. He had an MRI of his lumbar spine that was performed, one in 2016, and a newer study in April of this year. These studies are reviewed in the office and they demonstrate that Dylan [name] has evidence of significant lateral recess stenosis in the subarticular zone at both levels of L3-4 and L4-5. L4-5, in fact, appears to be somewhat more involved than L3-4. The alignment of his spine on plain x-rays that were performed in March of this year demonstrate that he has collapse of the disc space at the L4-5 level, but he has no evidence of a  listhesis between flexion and extension radiographs. He does have some straightening of the lumbar spine likely in an effort to maintain the integrity of his spinal canal. I reviewed the MRI findings with the patient and his daughter and noted the findings that he has.   REVIEW OF SYSTEMS:                                    His systems review is notable for some inability to smell and sinus problems, leg pain while walking, nausea, leg pain, joint pain, swelling, in addition to some difficulty with coordination in the arms and the legs.  PAST MEDICAL HISTORY:                                  . Current Medical Conditions:  His Past Medical History reveals that his general health has been fair. He does have some borderline diabetes, and he is taking metformin. It is believed that his last A1C is about 6.  . Medications and Allergies:  His current medications include Amlodipine, a Baby Aspirin, Citalopram, Coenzyme Q10, Esomeprazole or Nexium, Metformin, Pravastatin, Valsartan. In the evenings, he takes some Finasteride and Melatonin, in addition to the Metformin, and he uses some Alprazolam as-needed and more recently he has been using Oxycodone 10/ 325 as frequently as every four hours as-needed for pain control.   PHYSICAL EXAMINATION:                                On Physical Examination, I note that he stands straight and erect. He does have a bit of a mechanical quality to his gait, and he has some increased tone in the major groups, including the iliopsoas, the quadriceps, tibialis anterior, and the gastrocs. Deep tendon reflexes are 2+ in the patellae and 1+ in both Achilles. The reflex is hard to get on that left knee secondary to a total knee replacement, but he does have one on the more proximal muscle tendon. Straight leg raising reproduces back pain and pain in the hips at 60 degrees in either lower extremity. Patrick maneuver is negative, bilaterally.   IMPRESSION AND PLAN:                                  The patient has evidence of significant spondylytic disease in the lower lumbar spine, most notably at L3-4 and L4-5, with significant subarticular lateral recess stenosis at each of this levels. I indicated that ultimately he may need to consider surgical decompression at these levels. This would involve opening up the joint and undercutting the facet joint. The problem that we see frequently is that patients will tend to lose the decompression over a period of time and may require further surgery, sometimes very quickly and other times within five or perhaps seven years. I did note that I believe that he would benefit from the use of a coflex device to hold the interspinous space open at L3-4 and L4-5. I discussed the use of this device, and I believe it would be a good adjunct for him. At this time, we will plan on proceeding with surgical intervention, decompress and use the coflex stabilization device to help hold the spine open at L3-4 and L4-5. I noted that typically the hospital stay requires an overnight. Afterwards, he would require an external corset for a period of about four to six weeks, and thereafter, we would unlimit his activity as he tolerates. I hope that ultimately he gets good relief of the worst of his symptoms. I did note that the underlying processes are arthritic in nature, and unfortunately, the surgery does not render the patient a new spine, but decompressing the lateral recesses should give him some significant relief of symptoms.

## 2015-09-12 NOTE — Anesthesia Procedure Notes (Signed)
Procedure Name: Intubation Date/Time: 09/12/2015 1:38 PM Performed by: Eligha Bridegroom Pre-anesthesia Checklist: Patient identified, Emergency Drugs available, Suction available and Timeout performed Patient Re-evaluated:Patient Re-evaluated prior to inductionOxygen Delivery Method: Circle system utilized Preoxygenation: Pre-oxygenation with 100% oxygen Intubation Type: IV induction Ventilation: Oral airway inserted - appropriate to patient size Laryngoscope Size: Miller and 3 Grade View: Grade III Tube type: Oral Tube size: 7.5 mm Number of attempts: 1 Airway Equipment and Method: Stylet Placement Confirmation: positive ETCO2,  CO2 detector and breath sounds checked- equal and bilateral Secured at: 22 cm Tube secured with: Tape Dental Injury: Teeth and Oropharynx as per pre-operative assessment  Difficulty Due To: Difficult Airway- due to reduced neck mobility and Difficult Airway- due to anterior larynx

## 2015-09-12 NOTE — Transfer of Care (Signed)
Immediate Anesthesia Transfer of Care Note  Patient: Dylan [name]  Procedure(s) Performed: Procedure(s) with comments: L3-4 L4-5 Laminectomy with coflex (N/A) - L3-4 L4-5 Laminectomy with coflex  Patient Location: PACU  Anesthesia Type:General  Level of Consciousness: awake, alert  and oriented  Airway & Oxygen Therapy: Patient Spontanous Breathing and Patient connected to nasal cannula oxygen  Post-op Assessment: Report given to RN and Post -op Vital signs reviewed and stable  Post vital signs: Reviewed and stable  Last Vitals:  Filed Vitals:   09/12/15 1035 09/12/15 1036  BP: 134/91   Pulse: 73   Temp:  36.6 C  Resp: 20     Last Pain:  Filed Vitals:   09/12/15 1605  PainSc: 8       Patients Stated Pain Goal: 2 ([id number])  Complications: No apparent anesthesia complications

## 2015-09-12 NOTE — Anesthesia Preprocedure Evaluation (Addendum)
Anesthesia Evaluation  Patient identified by MRN, date of birth, ID band Patient awake    Reviewed: Allergy & Precautions, NPO status , Patient's Chart, lab work & pertinent test results  Airway Mallampati: II  TM Distance: >3 FB Neck ROM: Full    Dental   Pulmonary sleep apnea ,    breath sounds clear to auscultation       Cardiovascular hypertension,  Rhythm:Regular Rate:Normal     Neuro/Psych  Headaches, Anxiety  Neuromuscular disease    GI/Hepatic GERD  ,(+) Hepatitis -  Endo/Other  diabetes  Renal/GU      Musculoskeletal  (+) Arthritis ,   Abdominal   Peds  Hematology   Anesthesia Other Findings   Reproductive/Obstetrics                            Anesthesia Physical Anesthesia Plan  ASA: III  Anesthesia Plan: General   Post-op Pain Management:    Induction: Intravenous  Airway Management Planned: Oral ETT  Additional Equipment:   Intra-op Plan:   Post-operative Plan: Extubation in OR  Informed Consent: I have reviewed the patients History and Physical, chart, labs and discussed the procedure including the risks, benefits and alternatives for the proposed anesthesia with the patient or authorized representative who has indicated his/her understanding and acceptance.   Dental advisory given  Plan Discussed with: CRNA and Anesthesiologist  Anesthesia Plan Comments:         Anesthesia Quick Evaluation

## 2015-09-12 NOTE — Op Note (Signed)
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 Herniated nucleus pulposus L3-4 Procedure: Bilateral laminotomies and decompression L3-4 and L4-5 with decompression of L3-L4 and L5 nerve roots. Bilateral microdiscectomy L3-4.  Surgeon: [name] First assistant: Cyndy Freeze M.D. Anesthesia: Gen. endotracheal Indications: Dylan [name] is a 65 year old individual who has significant spinal stenosis at L3-4 and L4-5. Had problems with chronic lumbar radiculopathy that has been refractory to conservative treatment. After evaluation and review of his films I discussed with him consideration of surgical decompression at L3-4 and L4-5 via bilateral laminotomies and foraminotomies. Also discuss and plan on doing stabilization with Coflex device to maintain the integrity the interspinous space. He is now being taken to the operating room for the procedure.  Procedure: Patient was brought to the operating room supine on a stretcher. After the smooth induction of general endotracheal anesthesia, he was turned prone. The back was prepped with alcohol DuraPrep and draped in sterile fashion. A midline incision was created and carried down to the lumbar dorsal fascia which was opened on either side of midline. First identifiable interspace was known to be that of L4-L5. Then by placing retractors to expose both L3-4 and L4-5 laminotomies were created removing the margin of the lamina of L4 out to the mesial wall the facet. The decompression was performed on the left side first root moving a substantial amount of the yellow ligament to allow decompression of the dura centrally and then the takeoff of the L4 nerve root was decompressed superiorly and the L5 nerve root was decompressed inferiorly using a 2 mm Kerrison punch along with a high-speed  drill. Attention was turned to L3-4 for similar decompression was performed.  Once this decompression was accomplished the right side had laminotomies and foraminotomies created in a similar fashion.  No spinal fluid leaks were noted.   Coflex device is were then sized and placed at L4-5 and L3-4. At L4-5 and 10 mm Coflex fit well and allowed for interspinous distraction at L3-4 and 38mm Coflex device was placed and the wings were clamped to the spinous process. Final verification was obtained radiographically and when all was well the area was inspected carefully to make sure that the L3-L4 and the L5 nerve roots were well decompressed. Hemostasis in the soft tissues obtained meticulously and then the lumbar dorsal fascia was closed with #1 Vicryl in interrupted fashion. 20 mL of half percent Marcaine was injected into the paraspinous fascia. 2-0 Vicryl was used in the subcutaneous anus tissues and 3-0 Vicryl was used to close subcuticular skin. Dermabond was applied on the skin. Blood loss for the procedure was estimated at less than 100 mL.

## 2015-09-12 NOTE — Anesthesia Postprocedure Evaluation (Signed)
Anesthesia Post Note  Patient: Dylan [name]  Procedure(s) Performed: Procedure(s) (LRB): L3-4 L4-5 Laminectomy with coflex (N/A)  Patient location during evaluation: PACU Anesthesia Type: General Level of consciousness: awake and alert Pain management: pain level controlled Vital Signs Assessment: post-procedure vital signs reviewed and stable Respiratory status: spontaneous breathing, nonlabored ventilation, respiratory function stable and patient connected to nasal cannula oxygen Cardiovascular status: blood pressure returned to baseline and stable Postop Assessment: no signs of nausea or vomiting Anesthetic complications: no    Last Vitals:  Filed Vitals:   09/12/15 1604 09/12/15 1620  BP: 132/81 125/76  Pulse: 83 79  Temp:    Resp: 18 21    Last Pain:  Filed Vitals:   09/12/15 1624  PainSc: 8                  [name] DAVID

## 2015-09-13 ENCOUNTER — Encounter (HOSPITAL_COMMUNITY): Payer: Self-pay | Admitting: Neurological Surgery

## 2015-09-13 DIAGNOSIS — M4806 Spinal stenosis, lumbar region: Secondary | ICD-10-CM | POA: Diagnosis not present

## 2015-09-13 LAB — GLUCOSE, CAPILLARY: Glucose-Capillary: 105 mg/dL — ABNORMAL HIGH (ref 65–99)

## 2015-09-13 MED ORDER — DIAZEPAM 5 MG PO TABS: 5.0000 mg | ORAL_TABLET | Freq: Four times a day (QID) | ORAL | Status: DC | PRN

## 2015-09-13 MED ORDER — MUPIROCIN 2 % EX OINT
1.0000 "application " | TOPICAL_OINTMENT | Freq: Two times a day (BID) | CUTANEOUS | Status: DC
  Administered 2015-09-13: 1 via NASAL
  Filled 2015-09-13: qty 22

## 2015-09-13 MED ORDER — OXYCODONE-ACETAMINOPHEN 5-325 MG PO TABS: 1.0000 | ORAL_TABLET | ORAL | Status: DC | PRN

## 2015-09-13 MED ORDER — CHLORHEXIDINE GLUCONATE CLOTH 2 % EX PADS
6.0000 | MEDICATED_PAD | Freq: Every day | CUTANEOUS | Status: DC
  Administered 2015-09-13: 6 via TOPICAL

## 2015-09-13 NOTE — Evaluation (Signed)
Physical Therapy Evaluation and Discharge Patient Details Name: Dylan [name] MRN: [id number] DOB: Aug 23, 1950 Today's Date: 09/13/2015   History of Present Illness  Pt is a 65 y/o male who presents s/p L3-L5 laminectomy with coflex on 09/12/15.  Clinical Impression  Patient evaluated by Physical Therapy with no further acute PT needs identified. All education has been completed and the patient has no further questions. At the time of PT eval pt was able to perform transfers and ambulation with modified independence and no AD. Reviewed walking program, precautions, and general safety for home. See below for any follow-up Physical Therapy or equipment needs. PT is signing off. Thank you for this referral.     Follow Up Recommendations Outpatient PT;Supervision - Intermittent    Equipment Recommendations  None recommended by PT    Recommendations for Other Services       Precautions / Restrictions Precautions Precautions: Fall;Back Precaution Booklet Issued: Yes (comment) Precaution Comments: OT provided handout. PT reinforced precautions and cued pt during functional mobility.  Required Braces or Orthoses: Spinal Brace Spinal Brace: Lumbar corset;Applied in sitting position Restrictions Weight Bearing Restrictions: No      Mobility  Bed Mobility               General bed mobility comments: Pt received walking in the hall with OT  Transfers Overall transfer level: Modified independent Equipment used: None Transfers: Sit to/from Stand           General transfer comment: No assistance required. Pt was able to perform stand>sit with proper hand placement on seated surface for safety and controlled descent to chair.   Ambulation/Gait Ambulation/Gait assistance: Modified independent (Device/Increase time) Ambulation Distance (Feet): 400 Feet Assistive device: None Gait Pattern/deviations: Step-through pattern;Decreased stride length Gait velocity: Decreased Gait  velocity interpretation: Below normal speed for age/gender General Gait Details: VC's for maintenance of back precautions. Pt mobilizing well and did not demonstrate any unsteadiness or LOB.   Stairs Stairs: Yes Stairs assistance: Modified independent (Device/Increase time) Stair Management: One rail Left;Alternating pattern;Forwards Number of Stairs: 10 General stair comments: No unsteadiness or LOB noted. No assistance required.   Wheelchair Mobility    Modified Rankin (Stroke Patients Only)       Balance Overall balance assessment: No apparent balance deficits (not formally assessed)                                           Pertinent Vitals/Pain Pain Assessment: 0-10 Pain Score: 5  Pain Location: Incision site Pain Descriptors / Indicators: Operative site guarding;Sore Pain Intervention(s): Limited activity within patient's tolerance;Monitored during session;Repositioned    Home Living Family/patient expects to be discharged to:: Private residence Living Arrangements: Spouse/significant other Available Help at Discharge: Family Type of Home: House Home Access: Stairs to enter Entrance Stairs-Rails: Left Entrance Stairs-Number of Steps: 5 Home Layout: Two level Home Equipment: Environmental consultant - 2 wheels;Bedside commode;Cane - single point      Prior Function Level of Independence: Independent               Hand Dominance        Extremity/Trunk Assessment   Upper Extremity Assessment: Defer to OT evaluation           Lower Extremity Assessment: Overall WFL for tasks assessed      Cervical / Trunk Assessment: Other exceptions  Communication   Communication: No difficulties  Cognition Arousal/Alertness: Awake/alert Behavior During Therapy: WFL for tasks assessed/performed Overall Cognitive Status: Within Functional Limits for tasks assessed                      General Comments      Exercises        Assessment/Plan     PT Assessment Patent does not need any further PT services  PT Diagnosis Acute pain   PT Problem List    PT Treatment Interventions     PT Goals (Current goals can be found in the Care Plan section) Acute Rehab PT Goals Patient Stated Goal: Home today PT Goal Formulation: All assessment and education complete, DC therapy Potential to Achieve Goals: Good    Frequency     Barriers to discharge        Co-evaluation               End of Session Equipment Utilized During Treatment: Back brace Activity Tolerance: Patient tolerated treatment well Patient left: in chair;with call bell/phone within reach Nurse Communication: Mobility status    Functional Assessment Tool Used: Clinical judgement Functional Limitation: Mobility: Walking and moving around Mobility: Walking and Moving Around Current Status [id number]): At least 1 percent but less than 20 percent impaired, limited or restricted Mobility: Walking and Moving Around Goal Status [phone]): At least 1 percent but less than 20 percent impaired, limited or restricted Mobility: Walking and Moving Around Discharge Status [phone]): At least 1 percent but less than 20 percent impaired, limited or restricted    Time: 0816-0824 PT Time Calculation (min) (ACUTE ONLY): 8 min   Charges:   PT Evaluation $PT Eval Moderate Complexity: 1 Procedure     PT G Codes:   PT G-Codes **NOT FOR INPATIENT CLASS** Functional Assessment Tool Used: Clinical judgement Functional Limitation: Mobility: Walking and moving around Mobility: Walking and Moving Around Current Status [id number]): At least 1 percent but less than 20 percent impaired, limited or restricted Mobility: Walking and Moving Around Goal Status [phone]): At least 1 percent but less than 20 percent impaired, limited or restricted Mobility: Walking and Moving Around Discharge Status [phone]): At least 1 percent but less than 20 percent impaired, limited or restricted    Rolinda Roan 09/13/2015, 8:49 AM   Rolinda Roan, PT, DPT Acute Rehabilitation Services Pager: [phone]

## 2015-09-13 NOTE — Progress Notes (Signed)
Patient alert and oriented, mae's well, voiding adequate amount of urine, swallowing without difficulty, c/o mild pain and medication given prior to discharged.. Patient discharged home with family. Script and discharged instructions given to patient. Patient and family stated understanding of d/c instructions given and has an appointment with MD. 

## 2015-09-13 NOTE — Evaluation (Signed)
Occupational Therapy Evaluation Patient Details Name: Dylan [name] MRN: [id number] DOB: June 08, 1950 Today's Date: 09/13/2015    History of Present Illness 65 y/o male who presents s/p L3-L5 laminectomy with coflex on 09/12/15.   Clinical Impression   Patient evaluated by Occupational Therapy with no further acute OT needs identified. All education has been completed and the patient has no further questions. See below for any follow-up Occupational Therapy or equipment needs. OT to sign off. Thank you for referral.      Follow Up Recommendations  No OT follow up    Equipment Recommendations  None recommended by OT    Recommendations for Other Services       Precautions / Restrictions Precautions Precautions: Fall;Back Precaution Booklet Issued: Yes (comment) Precaution Comments: provided handout and reviewed adls with back precautions Required Braces or Orthoses: Spinal Brace Spinal Brace: Lumbar corset;Applied in sitting position Restrictions Weight Bearing Restrictions: No      Mobility Bed Mobility Overal bed mobility: Independent             General bed mobility comments: educated on sequence and able to return demo. pt states "that was easy"   Transfers Overall transfer level: Modified independent Equipment used: None Transfers: Sit to/from Stand           General transfer comment: No assistance required. Pt was able to perform stand>sit with proper hand placement on seated surface for safety and controlled descent to chair.     Balance Overall balance assessment: No apparent balance deficits (not formally assessed)                                          ADL Overall ADL's : Independent Pt educated on back handout and precautions during ADLs. Pt educated on setting an alarm at night for medications, avoiding washing directly on incision site, don/ doff brace, correct positioning chair for short periods of time, lifting  precautions, and home setup to avoid breaking precautions. Pt educated on sleeping positions and avoiding sleeping supine on stomach at this time.                                       General ADL Comments: educated on adls with precautions, pt able to cross bil le supine in the bed, able to complete shower transfer. and able to don brace. pt without further quesitons.      Vision     Perception     Praxis      Pertinent Vitals/Pain Pain Assessment: 0-10 Pain Score: 5  Pain Location: incision Pain Descriptors / Indicators: Operative site guarding Pain Intervention(s): Monitored during session;Premedicated before session;Repositioned     Hand Dominance Right   Extremity/Trunk Assessment Upper Extremity Assessment Upper Extremity Assessment: Overall WFL for tasks assessed   Lower Extremity Assessment Lower Extremity Assessment: Defer to PT evaluation   Cervical / Trunk Assessment Cervical / Trunk Assessment: Other exceptions Cervical / Trunk Exceptions: Lumbar surgical incision noted   Communication Communication Communication: No difficulties   Cognition Arousal/Alertness: Awake/alert Behavior During Therapy: WFL for tasks assessed/performed Overall Cognitive Status: Within Functional Limits for tasks assessed                     General Comments       Exercises  Shoulder Instructions      Home Living Family/patient expects to be discharged to:: Private residence Living Arrangements: Spouse/significant other Available Help at Discharge: Family Type of Home: House Home Access: Stairs to enter Technical brewer of Steps: 5 Entrance Stairs-Rails: Left Home Layout: Two level Alternate Level Stairs-Number of Steps: 14 (divided up by landings) Alternate Level Stairs-Rails: Right Bathroom Shower/Tub: Occupational psychologist: Handicapped height Bathroom Accessibility: Yes   Home Equipment: Environmental consultant - 2 wheels;Bedside  commode;Cane - single point   Additional Comments: will have wife and daughter (A) both are RNs      Prior Functioning/Environment Level of Independence: Independent             OT Diagnosis: Acute pain   OT Problem List:     OT Treatment/Interventions:      OT Goals(Current goals can be found in the care plan section) Acute Rehab OT Goals Patient Stated Goal: Home today  OT Frequency:     Barriers to D/C:            Co-evaluation              End of Session Equipment Utilized During Treatment: Back brace Nurse Communication: Mobility status;Precautions  Activity Tolerance: Patient tolerated treatment well Patient left: Other (comment) (with PT laura)   Time: [id number] OT Time Calculation (min): 27 min Charges:  OT General Charges $OT Visit: 1 Procedure OT Evaluation $OT Eval Moderate Complexity: 1 Procedure OT Treatments $Self Care/Home Management : 8-22 mins G-Codes: OT G-codes **NOT FOR INPATIENT CLASS** Functional Assessment Tool Used: clinical judgement Functional Limitation: Self care Self Care Current Status [id number]): 0 percent impaired, limited or restricted Self Care Goal Status [id number]): 0 percent impaired, limited or restricted Self Care Discharge Status [id number]): 0 percent impaired, limited or restricted  Parke Poisson B 09/13/2015, 9:59 AM

## 2015-09-13 NOTE — Discharge Summary (Signed)
Physician Discharge Summary  Patient ID: Dylan [name] MRN: [id number] DOB/AGE: Dec 03, 1950 65 y.o.  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 Active Problems:   Lumbar stenosis with neurogenic claudication   Discharged Condition: good  Hospital Course: Patient was admitted to undergo surgical decompression at L3-4 and L4-5 which she tolerated well. Had good relief of his leg pain.  Consults: None  Significant Diagnostic Studies: None  Treatments: surgery: Bilateral laminotomies and foraminotomies L3-4 and L4-5. Placement of Coflex  Discharge Exam: Blood pressure 125/65, pulse 81, temperature 97.7 F (36.5 C), temperature source Oral, resp. rate 20, weight 100.699 kg (222 lb), SpO2 95 %. Incision is clean and dry motor function is intact in lower extremities.  Disposition: 01-Home or Self Care  Discharge Instructions    Call MD for:  redness, tenderness, or signs of infection (pain, swelling, redness, odor or green/yellow discharge around incision site)    Complete by:  As directed      Call MD for:  severe uncontrolled pain    Complete by:  As directed      Call MD for:  temperature >100.4    Complete by:  As directed      Diet - low sodium heart healthy    Complete by:  As directed      Discharge instructions    Complete by:  As directed   Okay to shower. Do not apply salves or appointments to incision. No heavy lifting with the upper extremities greater than 15 pounds. May resume driving when not requiring pain medication and patient feels comfortable with doing so.     Increase activity slowly    Complete by:  As directed             Medication List    TAKE these medications        ALPRAZolam 0.25 MG tablet  Commonly known as:  XANAX   TAKE 1 TABLET BY MOUTH TWICE A DAY AS NEEDED FOR ANXIETY     amLODipine 5 MG tablet  Commonly known as:  NORVASC  TAKE 1 TABLET BY MOUTH DAILY     aspirin EC 81 MG tablet  Take 1 tablet (81 mg total) by mouth daily.     citalopram 20 MG tablet  Commonly known as:  CELEXA  TAKE 1 TABLET (20 MG TOTAL) BY MOUTH DAILY.     co-enzyme Q-10 30 MG capsule  Take 30 mg by mouth daily.     diazepam 5 MG tablet  Commonly known as:  VALIUM  Take 1 tablet (5 mg total) by mouth every 6 (six) hours as needed for muscle spasms.     dutasteride 0.5 MG capsule  Commonly known as:  AVODART  Take 1 capsule (0.5 mg total) by mouth daily.     esomeprazole 40 MG capsule  Commonly known as:  NEXIUM  TAKE 1 CAPSULE (40 MG TOTAL) BY MOUTH DAILY BEFORE BREAKFAST.     finasteride 5 MG tablet  Commonly known as:  PROSCAR  TAKE 1 TABLET (5 MG TOTAL) BY MOUTH DAILY.     gabapentin 600 MG tablet  Commonly known as:  NEURONTIN  Take 600 mg by mouth 3 (three) times daily.     Melatonin 5 MG Tabs  Take 1 tablet by mouth at bedtime.     metFORMIN 500 MG tablet  Commonly known as:  GLUCOPHAGE  TAKE 1 TABLET BY MOUTH TWICE A DAY WITH A MEAL     oxyCODONE-acetaminophen 10-325 MG tablet  Commonly known as:  PERCOCET  Take 1 tablet by mouth every 8 (eight) hours as needed for pain.     oxyCODONE-acetaminophen 5-325 MG tablet  Commonly known as:  PERCOCET/ROXICET  Take 1-2 tablets by mouth every 4 (four) hours as needed for moderate pain.     pravastatin 20 MG tablet  Commonly known as:  PRAVACHOL  Take 1 tablet (20 mg total) by mouth daily.     traMADol 50 MG tablet  Commonly known as:  ULTRAM  Take 1 to 2 tablets every 8 hours as needed.     valsartan 160 MG tablet  Commonly known as:  DIOVAN  TAKE 1 TABLET BY MOUTH EVERY DAY     zolpidem 10 MG tablet  Commonly known as:  AMBIEN  TAKE 1 TABLET BY MOUTH AT BEDTIME         Signed: Earleen Newport 09/13/2015, 9:27 AM

## 2015-09-14 ENCOUNTER — Other Ambulatory Visit: Payer: Managed Care, Other (non HMO)

## 2015-09-18 ENCOUNTER — Encounter (HOSPITAL_COMMUNITY): Payer: Self-pay | Admitting: Emergency Medicine

## 2015-09-18 DIAGNOSIS — E119 Type 2 diabetes mellitus without complications: Secondary | ICD-10-CM | POA: Insufficient documentation

## 2015-09-18 DIAGNOSIS — Z96652 Presence of left artificial knee joint: Secondary | ICD-10-CM | POA: Diagnosis not present

## 2015-09-18 DIAGNOSIS — G8918 Other acute postprocedural pain: Secondary | ICD-10-CM | POA: Diagnosis not present

## 2015-09-18 DIAGNOSIS — Z79899 Other long term (current) drug therapy: Secondary | ICD-10-CM | POA: Diagnosis not present

## 2015-09-18 DIAGNOSIS — Z7984 Long term (current) use of oral hypoglycemic drugs: Secondary | ICD-10-CM | POA: Insufficient documentation

## 2015-09-18 DIAGNOSIS — I10 Essential (primary) hypertension: Secondary | ICD-10-CM | POA: Insufficient documentation

## 2015-09-18 DIAGNOSIS — Z7982 Long term (current) use of aspirin: Secondary | ICD-10-CM | POA: Insufficient documentation

## 2015-09-18 DIAGNOSIS — M545 Low back pain: Secondary | ICD-10-CM | POA: Insufficient documentation

## 2015-09-18 MED ORDER — OXYCODONE-ACETAMINOPHEN 5-325 MG PO TABS
ORAL_TABLET | ORAL | Status: AC
  Filled 2015-09-18: qty 1

## 2015-09-18 MED ORDER — OXYCODONE-ACETAMINOPHEN 5-325 MG PO TABS
1.0000 | ORAL_TABLET | ORAL | Status: DC | PRN
  Administered 2015-09-18: 1 via ORAL

## 2015-09-18 NOTE — ED Triage Notes (Signed)
Pt sts lower back pain with radiation down leg after having sx recently; pt sts out of pain meds

## 2015-09-19 ENCOUNTER — Emergency Department (HOSPITAL_COMMUNITY)
Admission: EM | Admit: 2015-09-19 | Discharge: 2015-09-19 | Disposition: A | Discharge status: Home / Self Care | Payer: Managed Care, Other (non HMO) | Attending: Emergency Medicine | Admitting: Emergency Medicine

## 2015-09-19 ENCOUNTER — Emergency Department (HOSPITAL_COMMUNITY): Payer: Managed Care, Other (non HMO)

## 2015-09-19 DIAGNOSIS — G8918 Other acute postprocedural pain: Secondary | ICD-10-CM

## 2015-09-19 LAB — CBC WITH DIFFERENTIAL/PLATELET
BASOS ABS: 0 10*3/uL (ref 0.0–0.1)
Basophils Relative: 0 %
EOS ABS: 0.4 10*3/uL (ref 0.0–0.7)
EOS PCT: 4 %
HCT: 35.4 % — ABNORMAL LOW (ref 39.0–52.0)
Hemoglobin: 11.7 g/dL — ABNORMAL LOW (ref 13.0–17.0)
LYMPHS PCT: 44 %
Lymphs Abs: 3.6 10*3/uL (ref 0.7–4.0)
MCH: 28.4 pg (ref 26.0–34.0)
MCHC: 33.1 g/dL (ref 30.0–36.0)
MCV: 85.9 fL (ref 78.0–100.0)
MONO ABS: 0.9 10*3/uL (ref 0.1–1.0)
Monocytes Relative: 11 %
Neutro Abs: 3.3 10*3/uL (ref 1.7–7.7)
Neutrophils Relative %: 40 %
PLATELETS: 290 10*3/uL (ref 150–400)
RBC: 4.12 MIL/uL — ABNORMAL LOW (ref 4.22–5.81)
RDW: 12.9 % (ref 11.5–15.5)
WBC: 8.2 10*3/uL (ref 4.0–10.5)

## 2015-09-19 LAB — BASIC METABOLIC PANEL
Anion gap: 6 (ref 5–15)
BUN: 18 mg/dL (ref 6–20)
CALCIUM: 9.5 mg/dL (ref 8.9–10.3)
CO2: 28 mmol/L (ref 22–32)
CREATININE: 0.91 mg/dL (ref 0.61–1.24)
Chloride: 101 mmol/L (ref 101–111)
GFR calc Af Amer: >60 mL/min (ref >60)
GLUCOSE: 124 mg/dL — AB (ref 65–99)
POTASSIUM: 3.5 mmol/L (ref 3.5–5.1)
Sodium: 135 mmol/L (ref 135–145)

## 2015-09-19 LAB — URINALYSIS, ROUTINE W REFLEX MICROSCOPIC
Bilirubin Urine: NEGATIVE
GLUCOSE, UA: 250 mg/dL — AB
HGB URINE DIPSTICK: NEGATIVE
KETONES UR: NEGATIVE mg/dL
LEUKOCYTES UA: NEGATIVE
Nitrite: NEGATIVE
PROTEIN: NEGATIVE mg/dL
Specific Gravity, Urine: 1.015 (ref 1.005–1.030)
pH: 6 (ref 5.0–8.0)

## 2015-09-19 MED ORDER — METHOCARBAMOL 1000 MG/10ML IJ SOLN
1000.0000 mg | Freq: Once | INTRAVENOUS | Status: AC
  Administered 2015-09-19: 1000 mg via INTRAVENOUS
  Filled 2015-09-19: qty 10

## 2015-09-19 MED ORDER — NAPROXEN 500 MG PO TABS: 500.0000 mg | ORAL_TABLET | Freq: Two times a day (BID) | ORAL | 0 refills | Status: DC

## 2015-09-19 MED ORDER — HYDROMORPHONE HCL 1 MG/ML IJ SOLN
1.0000 mg | Freq: Once | INTRAMUSCULAR | Status: AC
  Administered 2015-09-19: 1 mg via INTRAVENOUS
  Filled 2015-09-19: qty 1

## 2015-09-19 MED ORDER — METHOCARBAMOL 1000 MG/10ML IJ SOLN
500.0000 mg | Freq: Once | INTRAVENOUS | Status: DC
  Filled 2015-09-19: qty 5

## 2015-09-19 MED ORDER — OXYCODONE-ACETAMINOPHEN 10-325 MG PO TABS: 1.0000 | ORAL_TABLET | ORAL | 0 refills | Status: DC | PRN

## 2015-09-19 MED ORDER — CYCLOBENZAPRINE HCL 10 MG PO TABS: 10.0000 mg | ORAL_TABLET | Freq: Three times a day (TID) | ORAL | 0 refills | Status: DC | PRN

## 2015-09-19 MED ORDER — ONDANSETRON HCL 4 MG/2ML IJ SOLN
4.0000 mg | Freq: Once | INTRAMUSCULAR | Status: AC
  Administered 2015-09-19: 4 mg via INTRAVENOUS
  Filled 2015-09-19: qty 2

## 2015-09-19 NOTE — Discharge Instructions (Signed)
Use naproxen instead of ibuprofen. It is okay to apply ice to the area for 20 minutes at a time, 3-4 times a day. Follow-up with your neurosurgeon.

## 2015-09-19 NOTE — ED Notes (Signed)
Patient transported to MRI 

## 2015-09-19 NOTE — ED Provider Notes (Signed)
[facility] DEPT Provider Note   CSN: [id number] Arrival date & time: 09/18/15  1700  First Provider Contact:  First MD Initiated Contact with Patient 09/19/15 0038     By signing my name below, I, [name], attest that this documentation has been prepared under the direction and in the presence of Dylan Fuel, MD. Electronically Signed: [name], ED Scribe. 09/19/15. 12:57 AM    History   Chief Complaint Chief Complaint  Patient presents with  . Back Pain    HPI  The history is provided by the patient. No language interpreter was used.   Dylan [name] is a 65 y.o. male 1 week s/p L3-L4, L4-L5 laminectomy with coflex who presents to the Emergency Department complaining of constant, 9/10 in severity lower back pain with onset 2 days ago. The pain radiates down his legs. Pt and family state that he was given a 5 day supply of Percocet after surgery and he ran out 2 days ago.Ibuprofen every 4 hours has provided insufficient pain relief at home. He stopped taking the Valium that he was prescribed because it made him undesirably drowsy. Associated symptoms include new incontinence of urine over the last 2 days. Pt denies numbness, tingling, and incontinence of stool. He is scheduled to follow up with his surgeon in 10 days.   Past Medical History:  Diagnosis Date  . Allergy    allergic rhinitis  . Anxiety    xanax as needed   . Arthritis    R knee, hands , back   . Cancer ([place])    melonoma- on face, treated with excision   . Complication of anesthesia    trouble waking up with succycholine, does not have enzyme to break down succycholine  . Diabetes mellitus   . Dizziness    MRI brain unremarkable 02/05/2015.  Marland Kitchen GERD (gastroesophageal reflux disease)   . Headache(784.0)   . Hepatitis age 31   history of infections Hepatitis type a  . History of melanoma 15 yrs ago   face and forehead-- previously followed by Derm in [place]  . History of ulcerative colitis     non recent flares  . Hyperlipidemia   . Hypertension   . Sleep apnea    last sleep study 10 yrs ago,CPAP- intermittent use, not every night      Patient Active Problem List   Diagnosis Date Noted  . Lumbar stenosis with neurogenic claudication 09/12/2015  . Generalized anxiety disorder 07/27/2015  . Otitis externa of right ear 08/26/2014  . Nausea without vomiting 06/03/2014  . Chest pain 04/27/2014  . Paronychia of finger of left hand 04/14/2013  . Hand eczema 04/14/2013  . Left knee pain 10/22/2012  . Osteoarthritis of left knee 08/06/2012  . Neck pain 05/10/2011  . Hypertension 05/10/2011  . BPH (benign prostatic hyperplasia) 08/18/2010  . VENTRAL HERNIA 02/20/2010  . DYSPLASTIC NEVUS 01/12/2010  . ARTHRITIS, GENERALIZED 11/15/2009  . KNEE PAIN 11/15/2009  . CHANGE IN BOWELS 11/15/2009  . CARPAL TUNNEL SYNDROME 08/07/2009  . SLEEP APNEA, OBSTRUCTIVE, MODERATE 03/22/2009  . INSOMNIA 03/22/2009  . CRAMP OF LIMB 02/03/2009  . BLURRED VISION 01/17/2009  . MEMORY LOSS 01/17/2009  . Diabetes mellitus without complication ([place]) [id number]  . MELANOMA 11/17/2008  . HYPERLIPIDEMIA 11/17/2008  . Adjustment disorder with mixed anxiety and depressed mood 11/17/2008  . ALLERGIC RHINITIS 11/17/2008  . HEADACHE 11/17/2008    Past Surgical History:  Procedure Laterality Date  . acl and mensicus repair reconstruct Right 1980's  .  ANTERIOR CERVICAL DECOMP/DISCECTOMY FUSION  10/15/2011   Procedure: ANTERIOR CERVICAL DECOMPRESSION/DISCECTOMY FUSION 1 LEVEL;  Surgeon: [name], MD;  Location: [facility];  Service: Neurosurgery;  Laterality: N/A;  Cervical four-five Anterior cervical decompression/diskectomy/fusion  . APPENDECTOMY  1969  . HERNIA REPAIR     inguinal / abdominal - repaired as an infant   . JOINT REPLACEMENT    . KNEE ARTHROSCOPY Left last done oct 2013   x 3  . KNEE SURGERY  yrs ago   right knee  . LUMBAR LAMINECTOMY WITH COFLEX 2 LEVEL N/A 09/12/2015    Procedure: L3-4 L4-5 Laminectomy with coflex;  Surgeon: [name], MD;  Location: [facility];  Service: Neurosurgery;  Laterality: N/A;  L3-4 L4-5 Laminectomy with coflex  . NASAL SINUS SURGERY     multiple sinus surgery - 1980's   . TONSILLECTOMY  1969  . TOTAL KNEE ARTHROPLASTY Left 10/22/2012   Procedure: TOTAL KNEE ARTHROPLASTY;  Surgeon: [name], MD;  Location: [facility];  Service: Orthopedics;  Laterality: Left;       Home Medications    Prior to Admission medications   Medication Sig Start Date End Date Taking? Authorizing Provider  ALPRAZolam (XANAX) 0.25 MG tablet TAKE 1 TABLET BY MOUTH TWICE A DAY AS NEEDED FOR ANXIETY 07/27/15   Dorothyann Peng, NP  amLODipine (NORVASC) 5 MG tablet TAKE 1 TABLET BY MOUTH DAILY Patient taking differently: TAKE 1 TABLET BY MOUTH DAILY    takes in the a.m. 02/06/15   Doe-Hyun R Shawna Orleans, DO  aspirin EC 81 MG tablet Take 1 tablet (81 mg total) by mouth daily. 10/24/12   [name], [name]  citalopram (CELEXA) 20 MG tablet TAKE 1 TABLET (20 MG TOTAL) BY MOUTH DAILY. Patient taking differently: TAKE 1 TABLET (20 MG TOTAL) BY MOUTH DAILY.    takes in a.,m. 09/01/15   Dorothyann Peng, NP  co-enzyme Q-10 30 MG capsule Take 30 mg by mouth daily.    Historical Provider, MD  diazepam (VALIUM) 5 MG tablet Take 1 tablet (5 mg total) by mouth every 6 (six) hours as needed for muscle spasms. 09/13/15   [name], MD  dutasteride (AVODART) 0.5 MG capsule Take 1 capsule (0.5 mg total) by mouth daily. Patient taking differently: Take 0.5 mg by mouth daily after supper.  06/29/15   Dorothyann Peng, NP  esomeprazole (NEXIUM) 40 MG capsule TAKE 1 CAPSULE (40 MG TOTAL) BY MOUTH DAILY BEFORE BREAKFAST. 08/30/15   Dorothyann Peng, NP  finasteride (PROSCAR) 5 MG tablet TAKE 1 TABLET (5 MG TOTAL) BY MOUTH DAILY. Patient taking differently: TAKE 1 TABLET (5 MG TOTAL) BY MOUTH DAILY.    takes after dinner 08/14/15   Dorothyann Peng, NP  gabapentin (NEURONTIN) 600 MG tablet Take 600 mg  by mouth 3 (three) times daily.    Historical Provider, MD  Melatonin 5 MG TABS Take 1 tablet by mouth at bedtime.     Historical Provider, MD  metFORMIN (GLUCOPHAGE) 500 MG tablet TAKE 1 TABLET BY MOUTH TWICE A DAY WITH A MEAL 09/01/15   Dorothyann Peng, NP  oxyCODONE-acetaminophen (PERCOCET) 10-325 MG tablet Take 1 tablet by mouth every 8 (eight) hours as needed for pain. 09/12/15   Dorothyann Peng, NP  oxyCODONE-acetaminophen (PERCOCET/ROXICET) 5-325 MG tablet Take 1-2 tablets by mouth every 4 (four) hours as needed for moderate pain. 09/13/15   [name], MD  pravastatin (PRAVACHOL) 20 MG tablet Take 1 tablet (20 mg total) by mouth daily. Patient taking differently: Take  20 mg by mouth daily after breakfast.  08/30/15   Dorothyann Peng, NP  traMADol (ULTRAM) 50 MG tablet Take 1 to 2 tablets every 8 hours as needed. 09/06/15   Dorothyann Peng, NP  valsartan (DIOVAN) 160 MG tablet TAKE 1 TABLET BY MOUTH EVERY DAY Patient taking differently: TAKE 1 TABLET BY MOUTH EVERY DAY      takes in a.m. 06/15/15   Doe-Hyun R Shawna Orleans, DO  zolpidem (AMBIEN) 10 MG tablet TAKE 1 TABLET BY MOUTH AT BEDTIME 08/30/15   Dorothyann Peng, NP    Family History Family History  Problem Relation Age of Onset  . Cancer Brother     brain tumor  . Diabetes Other   . Cancer Other     ovarian    Social History Social History  Substance Use Topics  . Smoking status: Never Smoker  . Smokeless tobacco: Former Systems developer    Quit date: 02/26/2003  . Alcohol use No     Allergies   Heparin; Succinylcholine chloride; Vancomycin; and Penicillins   Review of Systems Review of Systems  Genitourinary:       Incontinence of urine   Musculoskeletal: Positive for back pain.  Neurological: Negative for weakness and numbness.  All other systems reviewed and are negative.    Physical Exam Updated Vital Signs BP 129/76 (BP Location: Right Arm)   Pulse 86   Temp 97.6 F (36.4 C) (Oral)   Resp 16   SpO2 95%   Physical Exam    Constitutional: He is oriented to person, place, and time. He appears well-developed and well-nourished.  HENT:  Head: Normocephalic and atraumatic.  Eyes: EOM are normal. Pupils are equal, round, and reactive to light.  Neck: Normal range of motion. Neck supple. No JVD present.  Cardiovascular: Normal rate, regular rhythm, normal heart sounds and intact distal pulses.   No murmur heard. Pulmonary/Chest: Effort normal and breath sounds normal. He has no wheezes. He has no rales. He exhibits no tenderness.  Abdominal: Soft. He exhibits no distension and no mass. There is no tenderness.  Musculoskeletal: He exhibits tenderness. He exhibits no edema.  Tender throughout lumbar spine  Surgical incision healing appropriately +SLR bilaterally at 30 degrees   Lymphadenopathy:    He has no cervical adenopathy.  Neurological: He is alert and oriented to person, place, and time. He has normal reflexes. No cranial nerve deficit. He exhibits normal muscle tone. Coordination normal.  Skin: Skin is warm and dry. No rash noted.  Psychiatric: He has a normal mood and affect. His behavior is normal. Judgment and thought content normal.  Nursing note and vitals reviewed.    ED Treatments / Results  Labs (all labs ordered are listed, but only abnormal results are displayed) Labs Reviewed  BASIC METABOLIC PANEL  CBC WITH DIFFERENTIAL/PLATELET  URINALYSIS, ROUTINE W REFLEX MICROSCOPIC (NOT AT [facility])    EKG  EKG Interpretation None       Radiology No results found.  Procedures Procedures (including critical care time)  Medications Ordered in ED Medications  oxyCODONE-acetaminophen (PERCOCET/ROXICET) 5-325 MG per tablet 1 tablet (1 tablet Oral Given 09/18/15 1734)  oxyCODONE-acetaminophen (PERCOCET/ROXICET) 5-325 MG per tablet (not administered)  HYDROmorphone (DILAUDID) injection 1 mg (not administered)  ondansetron (ZOFRAN) injection 4 mg (not administered)  methocarbamol (ROBAXIN) 1,000  mg in dextrose 5 % 50 mL IVPB (not administered)     Initial Impression / Assessment and Plan / ED Course  I have reviewed the triage vital signs and the nursing  notes.  COORDINATION OF CARE: 12:49 AM Discussed treatment plan which includes lab work, MRI, and pain medication with pt at bedside and pt agreed to plan.  Pertinent labs & imaging results that were available during my care of the patient were reviewed by me and considered in my medical decision making (see chart for details).  Clinical Course    Postoperative low back pain. Patient was requiring fairly high doses of oxycodone and had run out of his medication. New-onset of urinary incontinence is worrisome for potential spinal cord or cauda equina injury. He is sent for MRI which showed postoperative changes but no evidence of significant spinal cord are cauda equina compression. He was given intravenous methocarbamol and hydromorphone with good relief of pain. Old records were reviewed confirming recent lumbar laminectomy and microdiscectomy. He states that cyclobenzaprine has been effective for him as a muscle relaxer in the past. He is discharged with prescriptions for cyclobenzaprine and oxycodone-acetaminophen. Also, since he was not getting relief with ibuprofen, he is given a prescription for naproxen to see if that will give him better relief. Follow-up with his neurosurgeon in the next week.  Final Clinical Impressions(s) / ED Diagnoses   Final diagnoses:  Post-operative pain    New Prescriptions New Prescriptions   CYCLOBENZAPRINE (FLEXERIL) 10 MG TABLET    Take 1 tablet (10 mg total) by mouth 3 (three) times daily as needed for muscle spasms.   NAPROXEN (NAPROSYN) 500 MG TABLET    Take 1 tablet (500 mg total) by mouth 2 (two) times daily.   OXYCODONE-ACETAMINOPHEN (PERCOCET) 10-325 MG TABLET    Take 1 tablet by mouth every 4 (four) hours as needed for pain.  I personally performed the services described in this  documentation, which was scribed in my presence. The recorded information has been reviewed and is accurate.       Dylan Fuel, MD [id number]

## 2015-09-20 ENCOUNTER — Encounter: Payer: Self-pay | Admitting: Adult Health

## 2015-09-21 ENCOUNTER — Encounter: Payer: Managed Care, Other (non HMO) | Admitting: Adult Health

## 2015-09-26 ENCOUNTER — Encounter: Payer: Self-pay | Admitting: Adult Health

## 2015-09-26 ENCOUNTER — Other Ambulatory Visit: Payer: Self-pay | Admitting: Adult Health

## 2015-09-26 NOTE — Telephone Encounter (Signed)
Ok to refill [place] for a month.   He needs to follow up with orthopedics for any other pain medications

## 2015-09-26 NOTE — Telephone Encounter (Signed)
Last refilled 08/30/15 - #30 with 0 refills. Ok to refill?

## 2015-10-18 ENCOUNTER — Encounter: Payer: Self-pay | Admitting: Adult Health

## 2015-10-19 ENCOUNTER — Encounter: Payer: Self-pay | Admitting: Gastroenterology

## 2015-12-08 ENCOUNTER — Other Ambulatory Visit: Payer: Self-pay | Admitting: Internal Medicine

## 2015-12-22 ENCOUNTER — Other Ambulatory Visit: Payer: Self-pay | Admitting: Adult Health

## 2015-12-22 DIAGNOSIS — R350 Frequency of micturition: Secondary | ICD-10-CM

## 2015-12-29 ENCOUNTER — Ambulatory Visit: Payer: Managed Care, Other (non HMO) | Admitting: Gastroenterology

## 2015-12-29 ENCOUNTER — Encounter: Payer: Self-pay | Admitting: Gastroenterology

## 2016-01-17 ENCOUNTER — Ambulatory Visit: Payer: Managed Care, Other (non HMO)

## 2016-01-24 ENCOUNTER — Ambulatory Visit: Payer: Managed Care, Other (non HMO)

## 2016-01-26 ENCOUNTER — Encounter: Payer: Managed Care, Other (non HMO) | Admitting: Physical Therapy

## 2016-01-26 ENCOUNTER — Other Ambulatory Visit: Payer: Self-pay | Admitting: Internal Medicine

## 2016-01-31 ENCOUNTER — Encounter: Payer: Managed Care, Other (non HMO) | Admitting: Physical Therapy

## 2016-01-31 NOTE — Telephone Encounter (Signed)
Please advise 

## 2016-01-31 NOTE — Telephone Encounter (Signed)
Establish care with Dorothyann Peng, NP 08/2015. However, Amlodipine was not refilled. Please follow up.

## 2016-01-31 NOTE — Telephone Encounter (Signed)
Ok to refill for one year  

## 2016-02-02 ENCOUNTER — Encounter: Payer: Managed Care, Other (non HMO) | Admitting: Physical Therapy

## 2016-02-05 ENCOUNTER — Encounter: Payer: Managed Care, Other (non HMO) | Admitting: Physical Therapy

## 2016-02-05 ENCOUNTER — Telehealth: Payer: Self-pay | Admitting: Internal Medicine

## 2016-02-05 NOTE — Telephone Encounter (Signed)
yes

## 2016-02-05 NOTE — Telephone Encounter (Signed)
Patient was a patient of Dr. Shawna Orleans.  He would like to know if Dr. Ronnald Ramp would take him on as a patient?

## 2016-02-07 ENCOUNTER — Encounter: Payer: Managed Care, Other (non HMO) | Admitting: Physical Therapy

## 2016-02-07 NOTE — Telephone Encounter (Signed)
Got patient scheduled

## 2016-02-12 ENCOUNTER — Encounter: Payer: Managed Care, Other (non HMO) | Admitting: Physical Therapy

## 2016-02-28 ENCOUNTER — Ambulatory Visit: Payer: Managed Care, Other (non HMO) | Admitting: Internal Medicine

## 2016-03-05 ENCOUNTER — Ambulatory Visit (INDEPENDENT_AMBULATORY_CARE_PROVIDER_SITE_OTHER): Payer: Commercial Managed Care - PPO | Admitting: Internal Medicine

## 2016-03-05 ENCOUNTER — Other Ambulatory Visit (INDEPENDENT_AMBULATORY_CARE_PROVIDER_SITE_OTHER): Payer: Commercial Managed Care - PPO

## 2016-03-05 ENCOUNTER — Encounter: Payer: Self-pay | Admitting: Internal Medicine

## 2016-03-05 VITALS — BP 138/88 | HR 94 | Temp 98.1°F | Resp 16 | Ht 71.0 in | Wt 233.5 lb

## 2016-03-05 DIAGNOSIS — M150 Primary generalized (osteo)arthritis: Secondary | ICD-10-CM

## 2016-03-05 DIAGNOSIS — E119 Type 2 diabetes mellitus without complications: Secondary | ICD-10-CM | POA: Diagnosis not present

## 2016-03-05 DIAGNOSIS — R1312 Dysphagia, oropharyngeal phase: Secondary | ICD-10-CM

## 2016-03-05 DIAGNOSIS — I10 Essential (primary) hypertension: Secondary | ICD-10-CM | POA: Diagnosis not present

## 2016-03-05 DIAGNOSIS — Z23 Encounter for immunization: Secondary | ICD-10-CM | POA: Diagnosis not present

## 2016-03-05 DIAGNOSIS — D539 Nutritional anemia, unspecified: Secondary | ICD-10-CM | POA: Insufficient documentation

## 2016-03-05 DIAGNOSIS — M159 Polyosteoarthritis, unspecified: Secondary | ICD-10-CM

## 2016-03-05 DIAGNOSIS — Z1211 Encounter for screening for malignant neoplasm of colon: Secondary | ICD-10-CM

## 2016-03-05 DIAGNOSIS — Z1159 Encounter for screening for other viral diseases: Secondary | ICD-10-CM | POA: Insufficient documentation

## 2016-03-05 DIAGNOSIS — K439 Ventral hernia without obstruction or gangrene: Secondary | ICD-10-CM

## 2016-03-05 DIAGNOSIS — R7303 Prediabetes: Secondary | ICD-10-CM | POA: Insufficient documentation

## 2016-03-05 LAB — CBC WITH DIFFERENTIAL/PLATELET
Basophils Absolute: 0.1 10*3/uL (ref 0.0–0.1)
Basophils Relative: 0.8 % (ref 0.0–3.0)
EOS PCT: 2.6 % (ref 0.0–5.0)
Eosinophils Absolute: 0.2 10*3/uL (ref 0.0–0.7)
HCT: 40.1 % (ref 39.0–52.0)
Hemoglobin: 13.8 g/dL (ref 13.0–17.0)
LYMPHS ABS: 3.7 10*3/uL (ref 0.7–4.0)
Lymphocytes Relative: 43.8 % (ref 12.0–46.0)
MCHC: 34.3 g/dL (ref 30.0–36.0)
MCV: 84.3 fl (ref 78.0–100.0)
MONOS PCT: 8.7 % (ref 3.0–12.0)
Monocytes Absolute: 0.7 10*3/uL (ref 0.1–1.0)
NEUTROS ABS: 3.7 10*3/uL (ref 1.4–7.7)
Neutrophils Relative %: 44.1 % (ref 43.0–77.0)
PLATELETS: 283 10*3/uL (ref 150.0–400.0)
RBC: 4.75 Mil/uL (ref 4.22–5.81)
RDW: 14.3 % (ref 11.5–15.5)
WBC: 8.5 10*3/uL (ref 4.0–10.5)

## 2016-03-05 LAB — MICROALBUMIN / CREATININE URINE RATIO
Creatinine,U: 80.9 mg/dL
Microalb Creat Ratio: 0.9 mg/g (ref 0.0–30.0)
Microalb, Ur: <0.7 mg/dL (ref 0.0–1.9)

## 2016-03-05 LAB — IBC PANEL
Iron: 80 ug/dL (ref 42–165)
Saturation Ratios: 18 % — ABNORMAL LOW (ref 20.0–50.0)
Transferrin: 318 mg/dL (ref 212.0–360.0)

## 2016-03-05 LAB — FERRITIN: Ferritin: 20.9 ng/mL — ABNORMAL LOW (ref 22.0–322.0)

## 2016-03-05 LAB — FOLATE: Folate: >23.4 ng/mL (ref >5.9)

## 2016-03-05 LAB — VITAMIN B12: Vitamin B-12: 487 pg/mL (ref 211–911)

## 2016-03-05 MED ORDER — TRAMADOL HCL 50 MG PO TABS: 50.0000 mg | ORAL_TABLET | Freq: Four times a day (QID) | ORAL | 5 refills | Status: DC | PRN

## 2016-03-05 NOTE — Patient Instructions (Signed)
Anemia, Nonspecific Anemia is a condition in which the concentration of red blood cells or hemoglobin in the blood is below normal. Hemoglobin is a substance in red blood cells that carries oxygen to the tissues of the body. Anemia results in not enough oxygen reaching these tissues. What are the causes? Common causes of anemia include:  Excessive bleeding. Bleeding may be internal or external. This includes excessive bleeding from periods (in women) or from the intestine.  Poor nutrition.  Chronic kidney, thyroid, and liver disease.  Bone marrow disorders that decrease red blood cell production.  Cancer and treatments for cancer.  HIV, AIDS, and their treatments.  Spleen problems that increase red blood cell destruction.  Blood disorders.  Excess destruction of red blood cells due to infection, medicines, and autoimmune disorders. What are the signs or symptoms?  Minor weakness.  Dizziness.  Headache.  Palpitations.  Shortness of breath, especially with exercise.  Paleness.  Cold sensitivity.  Indigestion.  Nausea.  Difficulty sleeping.  Difficulty concentrating. Symptoms may occur suddenly or they may develop slowly. How is this diagnosed? Additional blood tests are often needed. These help your health care provider determine the best treatment. Your health care provider will check your stool for blood and look for other causes of blood loss. How is this treated? Treatment varies depending on the cause of the anemia. Treatment can include:  Supplements of iron, vitamin B12, or folic acid.  Hormone medicines.  A blood transfusion. This may be needed if blood loss is severe.  Hospitalization. This may be needed if there is significant continual blood loss.  Dietary changes.  Spleen removal. Follow these instructions at home: Keep all follow-up appointments. It often takes many weeks to correct anemia, and having your health care provider check on your  condition and your response to treatment is very important. Get help right away if:  You develop extreme weakness, shortness of breath, or chest pain.  You become dizzy or have trouble concentrating.  You develop heavy vaginal bleeding.  You develop a rash.  You have bloody or black, tarry stools.  You faint.  You vomit up blood.  You vomit repeatedly.  You have abdominal pain.  You have a fever or persistent symptoms for more than 2-3 days.  You have a fever and your symptoms suddenly get worse.  You are dehydrated. This information is not intended to replace advice given to you by your health care provider. Make sure you discuss any questions you have with your health care provider. Document Released: 03/21/2004 Document Revised: 07/26/2015 Document Reviewed: 08/07/2012 Elsevier Interactive Patient Education  2017 [employer]  

## 2016-03-05 NOTE — Progress Notes (Signed)
Pre visit review using our clinic review tool, if applicable. No additional management support is needed unless otherwise documented below in the visit note. 

## 2016-03-05 NOTE — Progress Notes (Signed)
Subjective:  Patient ID: Dylan [name], male    DOB: 28-Jun-1950  Age: 66 y.o. MRN: [id number]  CC: Anemia; Osteoarthritis; and Back Pain   HPI Dylan [name] presents for Multiple concerns.  He has a symptomatic ventral hernia that he wants to see a general surgeon about. He complains of difficulty swallowing for the last few months - he complains that food and sometimes pills get stuck in his upper throat. He says he doesn't have a history of GERD but he also takes a proton pump inhibitor which helps with his symptoms. He does have a history of anemia. He has had no recent episodes of loss of appetite or weight loss.  He also complains of chronic low back pain and osteoarthritis pain. He has taken tramadol before and got symptom relief. He requests a refill.  History Dylan [name] has a past medical history of Allergy; Anxiety; Arthritis; Cancer ([place]); Complication of anesthesia; Diabetes mellitus; Dizziness; GERD (gastroesophageal reflux disease); Headache(784.0); Hepatitis (age 78); History of melanoma (15 yrs ago); History of ulcerative colitis; Hyperlipidemia; Hypertension; and Sleep apnea.   He has a past surgical history that includes Tonsillectomy (1969); Knee surgery (yrs ago); Anterior cervical decomp/discectomy fusion (10/15/2011); acl and mensicus repair reconstruct (Right, 1980's); Knee arthroscopy (Left, last done oct 2013); Appendectomy (1969); Total knee arthroplasty (Left, 10/22/2012); Joint replacement; Hernia repair; Nasal sinus surgery; and Lumbar laminectomy with coflex 2 level (N/A, 09/12/2015).   His family history includes Cancer in his brother and other; Diabetes in his other.He reports that he has never smoked. He quit smokeless tobacco use about 13 years ago. He reports that he does not drink alcohol or use drugs.  Outpatient Medications Prior to Visit  Medication Sig Dispense Refill  . ALPRAZolam (XANAX) 0.25 MG tablet TAKE 1 TABLET BY MOUTH TWICE A DAY AS NEEDED FOR  ANXIETY 60 tablet 0  . amLODipine (NORVASC) 5 MG tablet TAKE 1 TABLET BY MOUTH DAILY 90 tablet 3  . aspirin EC 81 MG tablet Take 1 tablet (81 mg total) by mouth daily.    Marland Kitchen co-enzyme Q-10 30 MG capsule Take 30 mg by mouth daily.    . cyclobenzaprine (FLEXERIL) 10 MG tablet Take 1 tablet (10 mg total) by mouth 3 (three) times daily as needed for muscle spasms. 30 tablet 0  . dutasteride (AVODART) 0.5 MG capsule TAKE 1 CAPSULE (0.5 MG TOTAL) BY MOUTH DAILY. 90 capsule 0  . esomeprazole (NEXIUM) 40 MG capsule TAKE 1 CAPSULE (40 MG TOTAL) BY MOUTH DAILY BEFORE BREAKFAST. 90 capsule 1  . gabapentin (NEURONTIN) 600 MG tablet Take 600 mg by mouth 3 (three) times daily.    . Melatonin 5 MG TABS Take 1 tablet by mouth at bedtime.     . metFORMIN (GLUCOPHAGE) 500 MG tablet TAKE 1 TABLET BY MOUTH TWICE A DAY WITH A MEAL 180 tablet 1  . oxyCODONE-acetaminophen (PERCOCET) 10-325 MG tablet Take 1 tablet by mouth every 4 (four) hours as needed for pain. 30 tablet 0  . pravastatin (PRAVACHOL) 20 MG tablet Take 1 tablet (20 mg total) by mouth daily. (Patient taking differently: Take 20 mg by mouth daily after breakfast. ) 90 tablet 1  . valsartan (DIOVAN) 160 MG tablet TAKE 1 TABLET BY MOUTH EVERY DAY 90 tablet 1  . zolpidem (AMBIEN) 10 MG tablet TAKE 1 TABLET BY MOUTH AT BEDTIME 30 tablet 0  . citalopram (CELEXA) 20 MG tablet TAKE 1 TABLET (20 MG TOTAL) BY MOUTH DAILY. (Patient taking differently: TAKE  1 TABLET (20 MG TOTAL) BY MOUTH DAILY.    takes in a.,m.) 90 tablet 1  . finasteride (PROSCAR) 5 MG tablet TAKE 1 TABLET (5 MG TOTAL) BY MOUTH DAILY. (Patient taking differently: TAKE 1 TABLET (5 MG TOTAL) BY MOUTH DAILY.    takes after dinner) 90 tablet 1  . naproxen (NAPROSYN) 500 MG tablet Take 1 tablet (500 mg total) by mouth 2 (two) times daily. 30 tablet 0  . oxyCODONE-acetaminophen (PERCOCET) 10-325 MG tablet Take 1 tablet by mouth every 8 (eight) hours as needed for pain. 10 tablet 0  . traMADol (ULTRAM) 50  MG tablet Take 1 to 2 tablets every 8 hours as needed. 45 tablet 0   No facility-administered medications prior to visit.     ROS Review of Systems  Constitutional: Negative for activity change, appetite change, chills, diaphoresis, fatigue and unexpected weight change.  HENT: Positive for trouble swallowing. Negative for facial swelling, sore throat and voice change.   Eyes: Negative for visual disturbance.  Respiratory: Negative for cough, chest tightness, shortness of breath, wheezing and stridor.   Cardiovascular: Negative for chest pain, palpitations and leg swelling.  Gastrointestinal: Negative for abdominal pain, blood in stool, constipation, diarrhea, nausea and vomiting.  Endocrine: Negative for cold intolerance, heat intolerance, polydipsia, polyphagia and polyuria.  Genitourinary: Negative.  Negative for dysuria, hematuria and urgency.  Musculoskeletal: Positive for arthralgias and back pain. Negative for joint swelling and myalgias.  Skin: Negative.   Allergic/Immunologic: Negative.   Neurological: Negative.  Negative for dizziness, weakness and numbness.  Hematological: Negative for adenopathy. Does not bruise/bleed easily.  Psychiatric/Behavioral: Negative.     Objective:  BP 138/88 (BP Location: Left Arm, Patient Position: Sitting, Cuff Size: Normal)   Pulse 94   Temp 98.1 F (36.7 C) (Oral)   Resp 16   Ht 5\' 11"  (1.803 m)   Wt 233 lb 8 oz (105.9 kg)   SpO2 94%   BMI 32.57 kg/m   Physical Exam  Constitutional: He is oriented to person, place, and time. No distress.  HENT:  Mouth/Throat: Oropharynx is clear and moist. No oropharyngeal exudate.  Eyes: Conjunctivae are normal. Right eye exhibits no discharge. Left eye exhibits no discharge. No scleral icterus.  Neck: Normal range of motion. Neck supple. No JVD present. No tracheal deviation present. No thyromegaly present.  Cardiovascular: Normal rate, regular rhythm, normal heart sounds and intact distal pulses.   Exam reveals no gallop and no friction rub.   No murmur heard. Pulmonary/Chest: Effort normal and breath sounds normal. No stridor. No respiratory distress. He has no wheezes. He has no rales. He exhibits no tenderness.  Abdominal: Soft. Bowel sounds are normal. He exhibits no distension and no mass. There is no tenderness. There is no rebound and no guarding.  Musculoskeletal: Normal range of motion. He exhibits no edema, tenderness or deformity.  Lymphadenopathy:    He has no cervical adenopathy.  Neurological: He is oriented to person, place, and time.  Skin: Skin is warm and dry. No rash noted. He is not diaphoretic. No erythema. No pallor.  Vitals reviewed.   Lab Results  Component Value Date   WBC 8.5 03/05/2016   HGB 13.8 03/05/2016   HCT 40.1 03/05/2016   PLT 283.0 03/05/2016   GLUCOSE 124 (H) 09/19/2015   CHOL 150 02/15/2014   TRIG 105.0 02/15/2014   HDL 40.00 02/15/2014   LDLCALC 89 02/15/2014   ALT 29 01/30/2015   AST 15 01/30/2015   NA 135 09/19/2015  K 3.5 09/19/2015   CL 101 09/19/2015   CREATININE 0.91 09/19/2015   BUN 18 09/19/2015   CO2 28 09/19/2015   TSH 1.691 01/30/2015   PSA 1.26 08/16/2010   INR 1.02 10/16/2012   HGBA1C 6.2 (H) 09/06/2015   MICROALBUR <0.7 03/05/2016    Assessment & Plan:   Jacaree was seen today for anemia, osteoarthritis and back pain.  Diagnoses and all orders for this visit:  Essential hypertension- his blood pressure is well-controlled, electrolytes and renal function are normal.  Deficiency anemia -     IBC panel; Future -     CBC with Differential/Platelet; Future -     Ferritin; Future -     Folate; Future -     Vitamin B12; Future  Prediabetes  Diabetes mellitus without complication ([place])- his blood sugars adequately well controlled. -     Microalbumin / creatinine urine ratio; Future  Primary osteoarthritis involving multiple joints -     traMADol (ULTRAM) 50 MG tablet; Take 1 tablet (50 mg total) by mouth  every 6 (six) hours as needed.  Oropharyngeal dysphagia- I've asked him to see GI to see if he needs upper endoscopy to for mass, cancer, esophagitis, stricture or rings. -     Ambulatory referral to Gastroenterology  Screen for colon cancer -     Ambulatory referral to Gastroenterology  Ventral hernia without obstruction or gangrene -     Ambulatory referral to General Surgery  Need for prophylactic vaccination against Streptococcus pneumoniae (pneumococcus) -     Pneumococcal conjugate vaccine 13-valent  Need for prophylactic vaccination and inoculation against influenza  Need for prophylactic vaccination with combined diphtheria-tetanus-pertussis (DTP) vaccine -     Tdap vaccine greater than or equal to 7yo IM   I have discontinued Mr. Serratore finasteride, citalopram, traMADol, and naproxen. I am also having him start on traMADol. Additionally, I am having him maintain his aspirin EC, Melatonin, co-enzyme Q-10, gabapentin, ALPRAZolam, metFORMIN, esomeprazole, pravastatin, oxyCODONE-acetaminophen, cyclobenzaprine, zolpidem, valsartan, dutasteride, amLODipine, and tamsulosin.  Meds ordered this encounter  Medications  . tamsulosin (FLOMAX) 0.4 MG CAPS capsule    Sig: TAKE 1 CAPSULE (0.4 MG TOTAL) BY MOUTH DAILY.    Refill:  3  . traMADol (ULTRAM) 50 MG tablet    Sig: Take 1 tablet (50 mg total) by mouth every 6 (six) hours as needed.    Dispense:  90 tablet    Refill:  5   Lab Results  Component Value Date   WBC 8.5 03/05/2016   HGB 13.8 03/05/2016   HCT 40.1 03/05/2016   PLT 283.0 03/05/2016   GLUCOSE 124 (H) 09/19/2015   CHOL 150 02/15/2014   TRIG 105.0 02/15/2014   HDL 40.00 02/15/2014   LDLCALC 89 02/15/2014   ALT 29 01/30/2015   AST 15 01/30/2015   NA 135 09/19/2015   K 3.5 09/19/2015   CL 101 09/19/2015   CREATININE 0.91 09/19/2015   BUN 18 09/19/2015   CO2 28 09/19/2015   TSH 1.691 01/30/2015   PSA 1.26 08/16/2010   INR 1.02 10/16/2012   HGBA1C 6.2 (H)  09/06/2015   MICROALBUR <0.7 03/05/2016    Follow-up: Return in about 2 months (around 05/03/2016).  Scarlette Calico, MD

## 2016-03-12 ENCOUNTER — Encounter: Payer: Self-pay | Admitting: Gastroenterology

## 2016-03-15 ENCOUNTER — Other Ambulatory Visit: Payer: Self-pay | Admitting: Family Medicine

## 2016-03-18 ENCOUNTER — Other Ambulatory Visit: Payer: Self-pay

## 2016-03-18 MED ORDER — PRAVASTATIN SODIUM 20 MG PO TABS: 20.0000 mg | ORAL_TABLET | Freq: Every day | ORAL | 1 refills | Status: DC

## 2016-03-22 ENCOUNTER — Other Ambulatory Visit: Payer: Self-pay | Admitting: Adult Health

## 2016-03-22 ENCOUNTER — Encounter: Payer: Self-pay | Admitting: Internal Medicine

## 2016-03-22 DIAGNOSIS — R350 Frequency of micturition: Secondary | ICD-10-CM

## 2016-03-22 LAB — HM DIABETES EYE EXAM

## 2016-03-22 MED ORDER — DUTASTERIDE 0.5 MG PO CAPS: 0.5000 mg | ORAL_CAPSULE | Freq: Every day | ORAL | 3 refills | Status: DC

## 2016-03-23 ENCOUNTER — Other Ambulatory Visit: Payer: Self-pay | Admitting: Adult Health

## 2016-03-23 DIAGNOSIS — R350 Frequency of micturition: Secondary | ICD-10-CM

## 2016-03-25 NOTE — Telephone Encounter (Signed)
Ok to refill for 6 months 

## 2016-04-04 ENCOUNTER — Encounter: Payer: Self-pay | Admitting: Internal Medicine

## 2016-04-04 NOTE — Progress Notes (Signed)
Result abstracted 

## 2016-04-15 ENCOUNTER — Ambulatory Visit: Payer: Self-pay | Admitting: Internal Medicine

## 2016-04-15 ENCOUNTER — Encounter: Payer: Self-pay | Admitting: Internal Medicine

## 2016-04-16 ENCOUNTER — Other Ambulatory Visit (INDEPENDENT_AMBULATORY_CARE_PROVIDER_SITE_OTHER): Payer: Commercial Managed Care - PPO

## 2016-04-16 ENCOUNTER — Ambulatory Visit (INDEPENDENT_AMBULATORY_CARE_PROVIDER_SITE_OTHER)
Admission: RE | Admit: 2016-04-16 | Discharge: 2016-04-16 | Disposition: A | Discharge status: Home / Self Care | Payer: Commercial Managed Care - PPO | Source: Ambulatory Visit | Attending: Internal Medicine | Admitting: Internal Medicine

## 2016-04-16 ENCOUNTER — Ambulatory Visit (INDEPENDENT_AMBULATORY_CARE_PROVIDER_SITE_OTHER): Payer: Commercial Managed Care - PPO | Admitting: Internal Medicine

## 2016-04-16 ENCOUNTER — Encounter: Payer: Self-pay | Admitting: Internal Medicine

## 2016-04-16 ENCOUNTER — Other Ambulatory Visit: Payer: Self-pay | Admitting: Internal Medicine

## 2016-04-16 VITALS — BP 118/70 | HR 105 | Temp 98.3°F | Resp 16 | Ht 71.0 in | Wt 230.0 lb

## 2016-04-16 DIAGNOSIS — E119 Type 2 diabetes mellitus without complications: Secondary | ICD-10-CM | POA: Diagnosis not present

## 2016-04-16 DIAGNOSIS — R110 Nausea: Secondary | ICD-10-CM

## 2016-04-16 DIAGNOSIS — I10 Essential (primary) hypertension: Secondary | ICD-10-CM | POA: Diagnosis not present

## 2016-04-16 LAB — COMPREHENSIVE METABOLIC PANEL
ALT: 22 U/L (ref 0–53)
AST: 16 U/L (ref 0–37)
Albumin: 4.4 g/dL (ref 3.5–5.2)
Alkaline Phosphatase: 57 U/L (ref 39–117)
BUN: 14 mg/dL (ref 6–23)
CHLORIDE: 104 meq/L (ref 96–112)
CO2: 30 mEq/L (ref 19–32)
Calcium: 9.3 mg/dL (ref 8.4–10.5)
Creatinine, Ser: 0.84 mg/dL (ref 0.40–1.50)
GFR: 97.7 mL/min (ref >60.00)
GLUCOSE: 87 mg/dL (ref 70–99)
POTASSIUM: 3.7 meq/L (ref 3.5–5.1)
Sodium: 139 mEq/L (ref 135–145)
Total Bilirubin: 0.3 mg/dL (ref 0.2–1.2)
Total Protein: 7 g/dL (ref 6.0–8.3)

## 2016-04-16 LAB — HEMOGLOBIN A1C: Hgb A1c MFr Bld: 6.2 % (ref 4.6–6.5)

## 2016-04-16 LAB — CBC WITH DIFFERENTIAL/PLATELET
BASOS PCT: 0.6 % (ref 0.0–3.0)
Basophils Absolute: 0.1 10*3/uL (ref 0.0–0.1)
EOS PCT: 3 % (ref 0.0–5.0)
Eosinophils Absolute: 0.3 10*3/uL (ref 0.0–0.7)
HCT: 41.8 % (ref 39.0–52.0)
Hemoglobin: 14.2 g/dL (ref 13.0–17.0)
LYMPHS ABS: 3.5 10*3/uL (ref 0.7–4.0)
Lymphocytes Relative: 41.4 % (ref 12.0–46.0)
MCHC: 33.9 g/dL (ref 30.0–36.0)
MCV: 85.6 fl (ref 78.0–100.0)
MONOS PCT: 7.9 % (ref 3.0–12.0)
Monocytes Absolute: 0.7 10*3/uL (ref 0.1–1.0)
NEUTROS ABS: 4 10*3/uL (ref 1.4–7.7)
NEUTROS PCT: 47.1 % (ref 43.0–77.0)
Platelets: 256 10*3/uL (ref 150.0–400.0)
RBC: 4.88 Mil/uL (ref 4.22–5.81)
RDW: 13.7 % (ref 11.5–15.5)
WBC: 8.5 10*3/uL (ref 4.0–10.5)

## 2016-04-16 LAB — LIPASE: Lipase: 38 U/L (ref 11.0–59.0)

## 2016-04-16 LAB — AMYLASE: Amylase: 35 U/L (ref 27–131)

## 2016-04-16 MED ORDER — ONDANSETRON HCL 4 MG PO TABS: 4.0000 mg | ORAL_TABLET | Freq: Three times a day (TID) | ORAL | 0 refills | Status: DC | PRN

## 2016-04-16 NOTE — Patient Instructions (Signed)
Nausea and Vomiting, Adult Nausea is the feeling that you have an upset stomach or have to vomit. As nausea gets worse, it can lead to vomiting. Vomiting occurs when stomach contents are thrown up and out of the mouth. Vomiting can make you feel weak and cause you to become dehydrated. Dehydration can make you tired and thirsty, cause you to have a dry mouth, and decrease how often you urinate. Older adults and people with other diseases or a weak immune system are at higher risk for dehydration. It is important to treat your nausea and vomiting as told by your health care provider. Follow these instructions at home: Follow instructions from your health care provider about how to care for yourself at home. Eating and drinking Follow these recommendations as told by your health care provider:  Take an oral rehydration solution (ORS). This is a drink that is sold at pharmacies and retail stores.  Drink clear fluids in small amounts as you are able. Clear fluids include water, ice chips, diluted fruit juice, and low-calorie sports drinks.  Eat bland, easy-to-digest foods in small amounts as you are able. These foods include bananas, applesauce, rice, lean meats, toast, and crackers.  Avoid fluids that contain a lot of sugar or caffeine, such as energy drinks, sports drinks, and soda.  Avoid alcohol.  Avoid spicy or fatty foods.  General instructions  Drink enough fluid to keep your urine clear or pale yellow.  Wash your hands often. If soap and water are not available, use hand sanitizer.  Make sure that all people in your household wash their hands well and often.  Take over-the-counter and prescription medicines only as told by your health care provider.  Rest at home while you recover.  Watch your condition for any changes.  Breathe slowly and deeply when you feel nauseated.  Keep all follow-up visits as told by your health care provider. This is important. Contact a health care  provider if:  You have a fever.  You cannot keep fluids down.  Your symptoms get worse.  You have new symptoms.  Your nausea does not go away after two days.  You feel light-headed or dizzy.  You have a headache.  You have muscle cramps. Get help right away if:  You have pain in your chest, neck, arm, or jaw.  You feel extremely weak or you faint.  You have persistent vomiting.  You see blood in your vomit.  Your vomit looks like black coffee grounds.  You have bloody or black stools or stools that look like tar.  You have a severe headache, a stiff neck, or both.  You have a rash.  You have severe pain, cramping, or bloating in your abdomen.  You have trouble breathing or you are breathing very quickly.  Your heart is beating very quickly.  Your skin feels cold and clammy.  You feel confused.  You have pain when you urinate.  You have signs of dehydration, such as: ? Dark urine, very little urine, or no urine. ? Cracked lips. ? Dry mouth. ? Sunken eyes. ? Sleepiness. ? Weakness. These symptoms may represent a serious problem that is an emergency. Do not wait to see if the symptoms will go away. Get medical help right away. Call your local emergency services (911 in the [place] Do not drive yourself to the hospital. This information is not intended to replace advice given to you by your health care provider. Make sure you discuss any questions you   have with your health care provider. Document Released: 02/11/2005 Document Revised: 07/17/2015 Document Reviewed: 10/18/2014 [employer] Interactive Patient Education  2017 [employer]  

## 2016-04-16 NOTE — Progress Notes (Signed)
Pre visit review using our clinic review tool, if applicable. No additional management support is needed unless otherwise documented below in the visit note. 

## 2016-04-16 NOTE — Progress Notes (Signed)
Subjective:  Patient ID: Dylan [name], male    DOB: January 29, 1951  Age: 66 y.o. MRN: [id number]  CC: Nausea   HPI Dylan [name] presents for A several month history of nausea and dry heaves that has worsened over the last 3 weeks. He has also had a few episodes of odynophagia and dysphagia. He has been scheduled to see GI later this week. He has taken his wife's supply of Zofran and that has provided some symptom relief. He has had a mild decrease in appetite and weight loss but he denies vomiting, hemoptysis, hematemesis, abdominal pain, diarrhea, or constipation. He has mild heartburn.  Outpatient Medications Prior to Visit  Medication Sig Dispense Refill  . amLODipine (NORVASC) 5 MG tablet TAKE 1 TABLET BY MOUTH DAILY 90 tablet 3  . aspirin EC 81 MG tablet Take 1 tablet (81 mg total) by mouth daily.    Marland Kitchen esomeprazole (NEXIUM) 40 MG capsule TAKE 1 CAPSULE (40 MG TOTAL) BY MOUTH DAILY BEFORE BREAKFAST. 90 capsule 1  . Melatonin 5 MG TABS Take 1 tablet by mouth at bedtime.     . metFORMIN (GLUCOPHAGE) 500 MG tablet TAKE 1 TABLET BY MOUTH TWICE A DAY WITH A MEAL 180 tablet 1  . pravastatin (PRAVACHOL) 20 MG tablet Take 1 tablet (20 mg total) by mouth daily. 90 tablet 1  . tamsulosin (FLOMAX) 0.4 MG CAPS capsule TAKE 1 CAPSULE (0.4 MG TOTAL) BY MOUTH DAILY.  3  . traMADol (ULTRAM) 50 MG tablet Take 1 tablet (50 mg total) by mouth every 6 (six) hours as needed. 90 tablet 5  . valsartan (DIOVAN) 160 MG tablet TAKE 1 TABLET BY MOUTH EVERY DAY 90 tablet 1  . co-enzyme Q-10 30 MG capsule Take 30 mg by mouth daily.    Marland Kitchen zolpidem (AMBIEN) 10 MG tablet TAKE 1 TABLET BY MOUTH AT BEDTIME 30 tablet 0  . gabapentin (NEURONTIN) 600 MG tablet Take 600 mg by mouth 3 (three) times daily.    Marland Kitchen ALPRAZolam (XANAX) 0.25 MG tablet TAKE 1 TABLET BY MOUTH TWICE A DAY AS NEEDED FOR ANXIETY (Patient not taking: Reported on 04/16/2016) 60 tablet 0  . cyclobenzaprine (FLEXERIL) 10 MG tablet Take 1 tablet (10 mg total)  by mouth 3 (three) times daily as needed for muscle spasms. (Patient not taking: Reported on 04/16/2016) 30 tablet 0  . dutasteride (AVODART) 0.5 MG capsule Take 1 capsule (0.5 mg total) by mouth daily. (Patient not taking: Reported on 04/16/2016) 90 capsule 3  . dutasteride (AVODART) 0.5 MG capsule TAKE 1 CAPSULE (0.5 MG TOTAL) BY MOUTH DAILY. (Patient not taking: Reported on 04/16/2016) 90 capsule 2  . oxyCODONE-acetaminophen (PERCOCET) 10-325 MG tablet Take 1 tablet by mouth every 4 (four) hours as needed for pain. (Patient not taking: Reported on 04/16/2016) 30 tablet 0   No facility-administered medications prior to visit.     ROS Review of Systems  Constitutional: Positive for unexpected weight change. Negative for appetite change, chills, diaphoresis, fatigue and fever.  HENT: Positive for trouble swallowing. Negative for sinus pressure and voice change.   Eyes: Negative for visual disturbance.  Respiratory: Negative for cough, chest tightness, shortness of breath, wheezing and stridor.   Cardiovascular: Negative.  Negative for chest pain, palpitations and leg swelling.  Gastrointestinal: Positive for nausea. Negative for abdominal pain, blood in stool, diarrhea and vomiting.  Endocrine: Negative.   Genitourinary: Negative.  Negative for difficulty urinating, flank pain, frequency and hematuria.  Musculoskeletal: Negative.  Negative for back  pain and neck pain.  Skin: Negative.   Allergic/Immunologic: Negative.   Neurological: Negative.  Negative for dizziness.  Hematological: Negative.  Negative for adenopathy. Does not bruise/bleed easily.  Psychiatric/Behavioral: Negative.     Objective:  BP 118/70 (BP Location: Left Arm, Patient Position: Sitting, Cuff Size: Normal)   Pulse (!) 105   Temp 98.3 F (36.8 C) (Oral)   Resp 16   Ht 5\' 11"  (1.803 m)   Wt 230 lb 0.6 oz (104.3 kg)   SpO2 94%   BMI 32.08 kg/m   BP Readings from Last 3 Encounters:  04/16/16 118/70  03/05/16  138/88  09/19/15 107/69    Wt Readings from Last 3 Encounters:  04/16/16 230 lb 0.6 oz (104.3 kg)  03/05/16 233 lb 8 oz (105.9 kg)  09/12/15 222 lb (100.7 kg)    Physical Exam  Constitutional: He is oriented to person, place, and time. No distress.  HENT:  Mouth/Throat: Oropharynx is clear and moist. No oropharyngeal exudate.  Eyes: Conjunctivae are normal. Right eye exhibits no discharge. Left eye exhibits no discharge. No scleral icterus.  Neck: Normal range of motion. Neck supple. No JVD present. No tracheal deviation present. No thyromegaly present.  Cardiovascular: Normal rate, regular rhythm, normal heart sounds and intact distal pulses.  Exam reveals no gallop and no friction rub.   No murmur heard. Pulmonary/Chest: Effort normal and breath sounds normal. No stridor. No respiratory distress. He has no wheezes. He has no rales. He exhibits no tenderness.  Abdominal: Soft. Normal appearance. He exhibits no distension and no mass. Bowel sounds are decreased. There is no hepatosplenomegaly. There is no tenderness. There is no rebound, no guarding and no CVA tenderness.  Musculoskeletal: Normal range of motion. He exhibits no edema, tenderness or deformity.  Lymphadenopathy:    He has no cervical adenopathy.  Neurological: He is oriented to person, place, and time.  Skin: Skin is warm and dry. No rash noted. He is not diaphoretic. No erythema. No pallor.  Vitals reviewed.   Lab Results  Component Value Date   WBC 8.5 04/16/2016   HGB 14.2 04/16/2016   HCT 41.8 04/16/2016   PLT 256.0 04/16/2016   GLUCOSE 87 04/16/2016   CHOL 150 02/15/2014   TRIG 105.0 02/15/2014   HDL 40.00 02/15/2014   LDLCALC 89 02/15/2014   ALT 22 04/16/2016   AST 16 04/16/2016   NA 139 04/16/2016   K 3.7 04/16/2016   CL 104 04/16/2016   CREATININE 0.84 04/16/2016   BUN 14 04/16/2016   CO2 30 04/16/2016   TSH 1.691 01/30/2015   PSA 1.26 08/16/2010   INR 1.02 10/16/2012   HGBA1C 6.2 04/16/2016    MICROALBUR <0.7 03/05/2016    Mr Lumbar Spine Wo Contrast  Result Date: 09/19/2015 CLINICAL DATA:  Initial evaluation for low back pain with urinary incontinence. Recent spinal surgery. EXAM: MRI LUMBAR SPINE WITHOUT CONTRAST TECHNIQUE: Multiplanar, multisequence MR imaging of the lumbar spine was performed. No intravenous contrast was administered. COMPARISON:  Prior MRI from 06/15/2015. FINDINGS: Segmentation: Normal segmentation. Lowest well-formed disc is presumed to be the L5-S1 level. Alignment: Alignment is stable with preservation of the normal lumbar lordosis. No listhesis or subluxation. Vertebrae: Vertebral body heights maintained. No acute or chronic fracture. Reactive endplate changes about the L4-5 interspace are similar to prior. No worrisome osseous lesions. Conus medullaris: Extends to the L1 level and appears normal. The nerve roots of the cauda equina extending from the L1-2 level through the L3-4 level  are slightly lifted and displaced anteriorly (series 6, image 14). A possible thin subdural collection present within the posterior margin of the thecal sac extending from L1-2 through L3-4 (series 3, image 6) may be present. This does not appear epidural to be in location as the epidural fat is preserved. This follows CSF signal intensity on all pulse sequences, with no intrinsic T1 signal intensity to suggest hemorrhage. This may reflect simple postoperative effusion. Pseudomeningocele not excluded. Paraspinal and other soft tissues: Postoperative changes from recent decompressive laminectomy at the L3-4 and L4-5 levels. Coflex devices in place within the interspinous regions at L3-4 and L4-5. No significant collection at the laminectomy site. No made of a small postoperative collection along the incisional wound within the subcutaneous fat at the midline without significant subfascial extension. Paraspinous soft tissues demonstrate no other acute abnormality. No retroperitoneal adenopathy.  Small T2 hyperintense cyst noted within the right kidney. Partially visualized bladder is moderately distended. Mild irregularity along the partially visualized posterior bladder wall likely related to bladder rugae. Intra-abdominal aorta is mildly tortuous and measures up to 2.7 cm. Disc levels: T12-L1:  Negative. L1-2: No disc bulge or disc protrusion. Bilateral facet arthrosis. No significant canal or foraminal stenosis. L2-3: Minimal annular disc bulge without focal disc herniation. Mild bilateral facet arthrosis. Short pedicles. No significant canal stenosis. Mild bilateral foraminal narrowing related of facet disease and short pedicles. L3-4: Status post wide bilateral laminectomy. Stable shallow broad-based posterior disc bulge. No new focal disc herniation. Short pedicles with residual facet disease. Persistent canal narrowing, improved relative to preoperative exam. Lateral recess stenosis also improved. Mild bilateral foraminal stenosis, left greater than right, similar to previous. L4-5: Status post decompressive laminectomy. Stable diffuse degenerative disc bulge. Overall, there is persistent moderate canal stenosis at this level with improved lateral recess narrowing. Stable residual facet disease. Mild to moderate bilateral foraminal stenosis, stable. Bulging disc contacts the L4 nerve roots bilaterally as the course that of the neural foramina, right slightly worse than left. No frank neural impingement. L5-S1: Shallow posterior disc protrusion minimally indents the ventral thecal sac. Associated annular fissure. Mild bilateral facet arthrosis. No significant canal or foraminal stenosis. IMPRESSION: 1. Postoperative sequelae of recent L3-4 and L4-5 wide decompressive laminectomy with Coflex device placement. Overall, there is improved lateral recess stenosis at these levels with improved canal narrowing at L3-4. Central canal stenosis at L4-5 is similar at this time. 2. Question small collection  within the posterior aspect of the thecal sac extending from approximately the L1-2 level inferiorly through the L3-4 level, possibly subdural in location. There is mild mass effect on the nerve roots of the cauda equina which are displaced anteriorly at these levels. 3. 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 4. Moderate dilatation of the partially visualized bladder. 5. Mild dilatation of the intra abdominal aorta up to 2.7 cm. Ectatic abdominal aorta at risk for aneurysm development. Recommend followup by ultrasound in 5 years. This recommendation follows ACR consensus guidelines: White Paper of the ACR Incidental Findings Committee II on Vascular Findings. J Am Coll Radiol 2013; 10:789-794. Electronically Signed   By: Jeannine Boga M.D.   On: 09/19/2015 03:17   Assessment & Plan:   Giann was seen today for nausea.  Diagnoses and all orders for this visit:  Essential hypertension- his blood pressure is adequately well controlled, electrolytes and renal function are normal. -     Comprehensive metabolic panel; Future -  CBC with Differential/Platelet; Future  Diabetes mellitus without complication ([place])- his [id number] is down to 6.3%. His blood sugars are adequately well-controlled. -     Hemoglobin A1c; Future  Nausea without vomiting- his labs are normal, his plain films are also unremarkable. I think he has diabetic gastroparesis. He will have a complete workup later this week with GI. In the meantime will control his symptoms with Zofran as needed. -     Comprehensive metabolic panel; Future -     Lipase; Future -     Amylase; Future -     ondansetron (ZOFRAN) 4 MG tablet; Take 1 tablet (4 mg total) by mouth every 8 (eight) hours as needed for nausea or vomiting. -     DG Abd Acute W/Chest; Future   I have discontinued Mr. Bruckman co-enzyme Q-10, ALPRAZolam, oxyCODONE-acetaminophen, cyclobenzaprine,  dutasteride, and dutasteride. I am also having him start on ondansetron. Additionally, I am having him maintain his aspirin EC, Melatonin, gabapentin, metFORMIN, esomeprazole, valsartan, amLODipine, tamsulosin, traMADol, and pravastatin.  Meds ordered this encounter  Medications  . ondansetron (ZOFRAN) 4 MG tablet    Sig: Take 1 tablet (4 mg total) by mouth every 8 (eight) hours as needed for nausea or vomiting.    Dispense:  35 tablet    Refill:  0     Follow-up: Return if symptoms worsen or fail to improve.  Scarlette Calico, MD

## 2016-04-17 ENCOUNTER — Encounter: Payer: Self-pay | Admitting: Internal Medicine

## 2016-04-17 NOTE — Telephone Encounter (Signed)
rx faxed to pharmacy

## 2016-04-19 ENCOUNTER — Ambulatory Visit (INDEPENDENT_AMBULATORY_CARE_PROVIDER_SITE_OTHER): Payer: Commercial Managed Care - PPO | Admitting: Gastroenterology

## 2016-04-19 ENCOUNTER — Encounter: Payer: Self-pay | Admitting: Gastroenterology

## 2016-04-19 VITALS — BP 130/84 | HR 80 | Ht 71.0 in | Wt 227.0 lb

## 2016-04-19 DIAGNOSIS — Z1211 Encounter for screening for malignant neoplasm of colon: Secondary | ICD-10-CM | POA: Diagnosis not present

## 2016-04-19 DIAGNOSIS — R1310 Dysphagia, unspecified: Secondary | ICD-10-CM | POA: Diagnosis not present

## 2016-04-19 MED ORDER — NA SULFATE-K SULFATE-MG SULF 17.5-3.13-1.6 GM/177ML PO SOLN: 1.0000 | Freq: Once | ORAL | 0 refills | Status: AC

## 2016-04-19 NOTE — Patient Instructions (Signed)
Start OTC omeprazole 20mg  pill, one pill 20-30 min before BF. You will be set up for an upper endoscopy for nausea, dysphagia. You will be set up for a colonoscopy for routine cancer screening.

## 2016-04-19 NOTE — Progress Notes (Signed)
HPI: This is a   very pleasant 66 year old Dylan [name]  who was referred to me by Dylan Lima, MD  to evaluate  dysphasia, routine risk for colon cancer .    Chief complaint is dysphagia, routine risk for colon cancer  Old Data Reviewed: None available   He has had  trouble swallowing, worse in  the past  4 weeks.  Scratchy sensation in throat with just about every swallow.  This scratchy sensation can happen even with liquids area  Dysphagia with solids caught about 1/2 way down.  Has been present 6-8 weeks, getting worse.     No pyrosis.  He does have rather chronic nausea and has even taken some of his wife's prescription Zofran.  He has soreness in perumbilical region. He has a clear bulging with felt solid in his midabdomen. He was artery seen by a surgeon who does not believe he has an abdominal wall hernia  Weight down slightly (in his usual range).  No antibiotics recently.  He has not had colon cancer screening in 20 years at least.  Had colonoscopy 20 years ago in [place], believes he had polyps  No colon cancer in family   Review of systems: Pertinent positive and negative review of systems were noted in the above HPI section. Complete review of systems was performed and was otherwise normal.   Past Medical History:  Diagnosis Date  . Allergy    allergic rhinitis  . Anxiety    xanax as needed   . Arthritis    R knee, hands , back   . Cancer ([place])    melonoma- on face, treated with excision   . Complication of anesthesia    trouble waking up with succycholine, does not have enzyme to break down succycholine  . Diabetes mellitus   . Dizziness    MRI brain unremarkable 02/05/2015.  Dylan Dylan [name] GERD (gastroesophageal reflux disease)   . Headache(784.0)   . Hepatitis age 70   history of infections Hepatitis type a  . History of melanoma 15 yrs ago   face and forehead-- previously followed by Derm in [place]  . History of ulcerative colitis    non recent flares  .  Hyperlipidemia   . Hypertension   . Sleep apnea    last sleep study 10 yrs ago,CPAP- intermittent use, not every night      Past Surgical History:  Procedure Laterality Date  . acl and mensicus repair reconstruct Right 1980's  . ANTERIOR CERVICAL DECOMP/DISCECTOMY FUSION  10/15/2011   Procedure: ANTERIOR CERVICAL DECOMPRESSION/DISCECTOMY FUSION 1 LEVEL;  Surgeon: [name], MD;  Location: [facility];  Service: Neurosurgery;  Laterality: N/A;  Cervical four-five Anterior cervical decompression/diskectomy/fusion  . APPENDECTOMY  1969  . HERNIA REPAIR     inguinal / abdominal - repaired as an infant   . JOINT REPLACEMENT    . KNEE ARTHROSCOPY Left last done oct 2013   x 3  . KNEE SURGERY  yrs ago   right knee  . LUMBAR LAMINECTOMY WITH COFLEX 2 LEVEL N/A 09/12/2015   Procedure: L3-4 L4-5 Laminectomy with coflex;  Surgeon: [name], MD;  Location: [facility];  Service: Neurosurgery;  Laterality: N/A;  L3-4 L4-5 Laminectomy with coflex  . NASAL SINUS SURGERY     multiple sinus surgery - 1980's   . TONSILLECTOMY  1969  . TOTAL KNEE ARTHROPLASTY Left 10/22/2012   Procedure: TOTAL KNEE ARTHROPLASTY;  Surgeon: [name], MD;  Location: [facility];  Service:  Orthopedics;  Laterality: Left;    Current Outpatient Prescriptions  Medication Sig Dispense Refill  . amLODipine (NORVASC) 5 MG tablet TAKE 1 TABLET BY MOUTH DAILY 90 tablet 3  . aspirin EC 81 MG tablet Take 1 tablet (81 mg total) by mouth daily.    Dylan Dylan [name] dutasteride (AVODART) 0.5 MG capsule daily.    Dylan Dylan [name] esomeprazole (NEXIUM) 40 MG capsule TAKE 1 CAPSULE (40 MG TOTAL) BY MOUTH DAILY BEFORE BREAKFAST. 90 capsule 1  . gabapentin (NEURONTIN) 600 MG tablet Take 600 mg by mouth 3 (three) times daily.    . Melatonin 5 MG TABS Take 1 tablet by mouth at bedtime.     . metFORMIN (GLUCOPHAGE) 500 MG tablet TAKE 1 TABLET BY MOUTH TWICE A DAY WITH A MEAL 180 tablet 1  . ondansetron (ZOFRAN) 4 MG tablet Take 1 tablet (4 mg total) by mouth  every 8 (eight) hours as needed for nausea or vomiting. 35 tablet 0  . pravastatin (PRAVACHOL) 20 MG tablet Take 1 tablet (20 mg total) by mouth daily. 90 tablet 1  . tamsulosin (FLOMAX) 0.4 MG CAPS capsule TAKE 1 CAPSULE (0.4 MG TOTAL) BY MOUTH DAILY.  3  . traMADol (ULTRAM) 50 MG tablet Take 1 tablet (50 mg total) by mouth every 6 (six) hours as needed. 90 tablet 5  . valsartan (DIOVAN) 160 MG tablet TAKE 1 TABLET BY MOUTH EVERY DAY 90 tablet 1  . zolpidem (AMBIEN) 10 MG tablet TAKE 1 TABLET AT BEDTIME AS NEEDED 30 tablet 2   No current facility-administered medications for this visit.     Allergies as of 04/19/2016 - Review Complete 04/19/2016  Allergen Reaction Noted  . Heparin Anaphylaxis   . Succinylcholine chloride Other (See Comments)   . Vancomycin Hives and Other (See Comments) 10/15/2011  . Penicillins Rash     Family History  Problem Relation Age of Onset  . Cancer Brother     brain tumor  . Ovarian cancer Mother   . Diabetes Father   . Colon cancer Neg Hx   . Stomach cancer Neg Hx   . Rectal cancer Neg Hx   . Esophageal cancer Neg Hx   . Liver cancer Neg Hx     Social History   Social History  . Marital status: Married    Spouse name: N/A  . Number of children: 3  . Years of education: N/A   Occupational History  . Materials engineer    Social History Main Topics  . Smoking status: Never Smoker  . Smokeless tobacco: Former Systems developer    Quit date: 02/26/2003  . Alcohol use No  . Drug use: No  . Sexual activity: Not on file   Other Topics Concern  . Not on file   Social History Narrative   Wife works at cath lab at [employer] advanced breast cancer     Physical Exam: BP 130/84   Pulse 80   Ht 5\' 11"  (1.803 m)   Wt 227 lb (103 kg)   BMI 31.66 kg/m  Constitutional: generally well-appearing Psychiatric: alert and oriented x3 Eyes: extraocular movements intact Mouth: oral pharynx moist, no lesions Neck: supple no lymphadenopathy Cardiovascular: heart  regular rate and rhythm Lungs: clear to auscultation bilaterally Abdomen: Clear bulging midline ; not an overt hernia defect however. soft, nontender, nondistended, no obvious ascites, no peritoneal signs, normal bowel sounds Extremities: no lower extremity edema bilaterally Skin: no lesions on visible extremities   Assessment and plan: 66 y.o. male with  chronic  nausea, dysphagia, routine risk for colon cancer  First he has not had colon cancer screening for at least 20 years and I recommended we proceed with colonoscopy at his soonest convenience. At the same time I will proceed with upper endoscopy to evaluate his nausea, dysphagia. I suspect these are GERD related. He does not have classic pyrosis but he certainly could have acid causing intermittent nausea, causing swelling, edema in the esophagus that can lead to dysphagia. Other etiologies include peptic stricturing, Schatzki's ring, neoplasm which I think is unlikely. He will start over-the-counter proton pump inhibitor once daily until the time of his upper endoscopy.   Please see the "Patient Instructions" section for addition details about the plan.   Owens Loffler, MD [facility] 04/19/2016, 9:29 AM  Cc: Dylan Lima, MD

## 2016-06-04 ENCOUNTER — Encounter: Payer: Self-pay | Admitting: Gastroenterology

## 2016-06-06 ENCOUNTER — Encounter: Payer: Self-pay | Admitting: Internal Medicine

## 2016-06-07 ENCOUNTER — Ambulatory Visit (INDEPENDENT_AMBULATORY_CARE_PROVIDER_SITE_OTHER): Payer: Commercial Managed Care - PPO | Admitting: Nurse Practitioner

## 2016-06-07 ENCOUNTER — Encounter: Payer: Self-pay | Admitting: Nurse Practitioner

## 2016-06-07 VITALS — BP 138/76 | HR 102 | Temp 98.1°F | Ht 71.0 in | Wt 234.0 lb

## 2016-06-07 DIAGNOSIS — J0140 Acute pansinusitis, unspecified: Secondary | ICD-10-CM

## 2016-06-07 MED ORDER — METHYLPREDNISOLONE ACETATE 80 MG/ML IJ SUSP
80.0000 mg | Freq: Once | INTRAMUSCULAR | Status: AC
  Administered 2016-06-07: 80 mg via INTRAMUSCULAR

## 2016-06-07 MED ORDER — DOXYCYCLINE HYCLATE 100 MG PO TABS: 100.0000 mg | ORAL_TABLET | Freq: Two times a day (BID) | ORAL | 0 refills | Status: AC

## 2016-06-07 MED ORDER — SALINE SPRAY 0.65 % NA SOLN: 1.0000 | NASAL | 0 refills | Status: DC | PRN

## 2016-06-07 MED ORDER — FLUTICASONE PROPIONATE 50 MCG/ACT NA SUSP: 2.0000 | Freq: Every day | NASAL | 0 refills | Status: DC

## 2016-06-07 NOTE — Patient Instructions (Signed)
[facility] Instructions: Flonase and Afrin use: apply 1spray of afrin in each nare, wait 5mins, then apply 2sprays of flonase in each nare. Use both nasal spray consecutively x 3days, then flonase only for at least 14days.  Encourage adequate oral hydration.  Use over-the-counter  "cold" medicines  such as "Tylenol cold" , "Advil cold",  "Mucinex" or" Mucinex D"  for cough and congestion.  Avoid decongestants if you have high blood pressure. Use" Delsym" or" Robitussin" cough syrup varietis for cough.  You can use plain "Tylenol" or "Advi"l for fever, chills and achyness.   "Common cold" symptoms are usually triggered by a virus.  The antibiotics are usually not necessary. On average, a" viral cold" illness would take 4-7 days to resolve. Please, make an appointment if you are not better or if you're worse.  

## 2016-06-07 NOTE — Progress Notes (Signed)
Subjective:  Patient ID: Dylan [name], male    DOB: 05/04/1950  Age: 66 y.o. MRN: [id number]  CC: Nasal Congestion (congestion,couging yellow mucus going on for 3 wks. )   Sinusitis  This is a new problem. The current episode started 1 to 4 weeks ago. The problem has been waxing and waning since onset. Associated symptoms include congestion, coughing, ear pain, headaches, a hoarse voice, sinus pressure, sneezing and a sore throat. Pertinent negatives include no diaphoresis, neck pain, shortness of breath or swollen glands. Past treatments include saline sprays. The treatment provided no relief.    Outpatient Medications Prior to Visit  Medication Sig Dispense Refill  . amLODipine (NORVASC) 5 MG tablet TAKE 1 TABLET BY MOUTH DAILY 90 tablet 3  . aspirin EC 81 MG tablet Take 1 tablet (81 mg total) by mouth daily.    Marland Kitchen dutasteride (AVODART) 0.5 MG capsule daily.    Marland Kitchen esomeprazole (NEXIUM) 40 MG capsule TAKE 1 CAPSULE (40 MG TOTAL) BY MOUTH DAILY BEFORE BREAKFAST. 90 capsule 1  . gabapentin (NEURONTIN) 600 MG tablet Take 600 mg by mouth 3 (three) times daily.    . Melatonin 5 MG TABS Take 1 tablet by mouth at bedtime.     . metFORMIN (GLUCOPHAGE) 500 MG tablet TAKE 1 TABLET BY MOUTH TWICE A DAY WITH A MEAL 180 tablet 1  . ondansetron (ZOFRAN) 4 MG tablet Take 1 tablet (4 mg total) by mouth every 8 (eight) hours as needed for nausea or vomiting. 35 tablet 0  . pravastatin (PRAVACHOL) 20 MG tablet Take 1 tablet (20 mg total) by mouth daily. 90 tablet 1  . tamsulosin (FLOMAX) 0.4 MG CAPS capsule TAKE 1 CAPSULE (0.4 MG TOTAL) BY MOUTH DAILY.  3  . traMADol (ULTRAM) 50 MG tablet Take 1 tablet (50 mg total) by mouth every 6 (six) hours as needed. 90 tablet 5  . valsartan (DIOVAN) 160 MG tablet TAKE 1 TABLET BY MOUTH EVERY DAY 90 tablet 1  . zolpidem (AMBIEN) 10 MG tablet TAKE 1 TABLET AT BEDTIME AS NEEDED 30 tablet 2   No facility-administered medications prior to visit.     ROS See  HPI  Objective:  BP 138/76   Pulse (!) 102   Temp 98.1 F (36.7 C)   Ht 5\' 11"  (1.803 m)   Wt 234 lb (106.1 kg)   SpO2 98%   BMI 32.64 kg/m   BP Readings from Last 3 Encounters:  06/07/16 138/76  04/19/16 130/84  04/16/16 118/70    Wt Readings from Last 3 Encounters:  06/07/16 234 lb (106.1 kg)  04/19/16 227 lb (103 kg)  04/16/16 230 lb 0.6 oz (104.3 kg)    Physical Exam  Constitutional: He is oriented to person, place, and time. No distress.  HENT:  Right Ear: Tympanic membrane, external ear and ear canal normal.  Left Ear: Tympanic membrane and ear canal normal.  Nose: Mucosal edema and rhinorrhea present. Right sinus exhibits maxillary sinus tenderness and frontal sinus tenderness. Left sinus exhibits maxillary sinus tenderness and frontal sinus tenderness.  Mouth/Throat: Uvula is midline. Posterior oropharyngeal erythema present. No oropharyngeal exudate.  Eyes: No scleral icterus.  Neck: Normal range of motion. Neck supple.  Cardiovascular: Normal rate and regular rhythm.   Pulmonary/Chest: Effort normal and breath sounds normal.  Lymphadenopathy:    He has no cervical adenopathy.  Neurological: He is alert and oriented to person, place, and time.  Vitals reviewed.   Lab Results  Component Value Date  WBC 8.5 04/16/2016   HGB 14.2 04/16/2016   HCT 41.8 04/16/2016   PLT 256.0 04/16/2016   GLUCOSE 87 04/16/2016   CHOL 150 02/15/2014   TRIG 105.0 02/15/2014   HDL 40.00 02/15/2014   LDLCALC 89 02/15/2014   ALT 22 04/16/2016   AST 16 04/16/2016   NA 139 04/16/2016   K 3.7 04/16/2016   CL 104 04/16/2016   CREATININE 0.84 04/16/2016   BUN 14 04/16/2016   CO2 30 04/16/2016   TSH 1.691 01/30/2015   PSA 1.26 08/16/2010   INR 1.02 10/16/2012   HGBA1C 6.2 04/16/2016   MICROALBUR <0.7 03/05/2016    Dg Abd Acute W/chest  Result Date: 04/16/2016 CLINICAL DATA:  66 year old male with a history of nausea for 6 weeks EXAM: DG ABDOMEN ACUTE W/ 1V CHEST  COMPARISON:  09/28/2015, 06/08/2015, 10/16/2012 FINDINGS: Chest: Cardiomediastinal silhouette unchanged in size and contour. No evidence of central vascular congestion or interlobular septal thickening. No pneumothorax. No large pleural effusion. No confluent airspace disease. Abdomen: Surgical changes overlying L4 on L5, similar to comparison studies. Gas within stomach, small bowel, colon, without abnormal distention. Small volume of gas in the rectum. No unexpected radiopaque foreign body. No unexpected calcifications. No displaced fracture. IMPRESSION: Chest: No radiographic evidence of acute cardiopulmonary disease. Abdomen: Nonobstructive bowel gas pattern with mild stool burden. Surgical changes of L4 and L5 Signed, Dulcy Fanny. Earleen Newport, DO [facility] Electronically Signed   By: Corrie Mckusick D.O.   On: 04/16/2016 17:25    Assessment & Plan:   Dylan [name] was seen today for nasal congestion.  Diagnoses and all orders for this visit:  Acute non-recurrent pansinusitis -     fluticasone (FLONASE) 50 MCG/ACT nasal spray; Place 2 sprays into both nostrils daily. -     sodium chloride (OCEAN) 0.65 % SOLN nasal spray; Place 1 spray into both nostrils as needed for congestion. -     methylPREDNISolone acetate (DEPO-MEDROL) injection 80 mg; Inject 1 mL (80 mg total) into the muscle once. -     doxycycline (VIBRA-TABS) 100 MG tablet; Take 1 tablet (100 mg total) by mouth 2 (two) times daily.   I am having Dylan [name] start on fluticasone, sodium chloride, and doxycycline. I am also having him maintain his aspirin EC, Melatonin, gabapentin, metFORMIN, esomeprazole, valsartan, amLODipine, tamsulosin, traMADol, pravastatin, ondansetron, zolpidem, and dutasteride. We will continue to administer methylPREDNISolone acetate.  Meds ordered this encounter  Medications  . fluticasone (FLONASE) 50 MCG/ACT nasal spray    Sig: Place 2 sprays into both nostrils  daily.    Dispense:  16 g    Refill:  0    Order Specific Question:   Supervising Provider    Answer:   Cassandria Anger [[id number]]  . sodium chloride (OCEAN) 0.65 % SOLN nasal spray    Sig: Place 1 spray into both nostrils as needed for congestion.    Dispense:  15 mL    Refill:  0    Order Specific Question:   Supervising Provider    Answer:   Cassandria Anger [[id number]]  . methylPREDNISolone acetate (DEPO-MEDROL) injection 80 mg  . doxycycline (VIBRA-TABS) 100 MG tablet    Sig: Take 1 tablet (100 mg total) by mouth 2 (two) times daily.    Dispense:  14 tablet    Refill:  0    Order Specific Question:   Supervising Provider    Answer:   Cassandria Anger [[id number]]  Follow-up: No Follow-up on file.  Wilfred Lacy, NP

## 2016-06-07 NOTE — Progress Notes (Signed)
Pre visit review using our clinic review tool, if applicable. No additional management support is needed unless otherwise documented below in the visit note. 

## 2016-06-18 ENCOUNTER — Ambulatory Visit (AMBULATORY_SURGERY_CENTER): Payer: Commercial Managed Care - PPO | Admitting: Gastroenterology

## 2016-06-18 ENCOUNTER — Encounter: Payer: Self-pay | Admitting: Gastroenterology

## 2016-06-18 VITALS — BP 120/70 | HR 68 | Temp 99.1°F | Resp 18 | Ht 71.0 in | Wt 227.0 lb

## 2016-06-18 DIAGNOSIS — D120 Benign neoplasm of cecum: Secondary | ICD-10-CM | POA: Diagnosis not present

## 2016-06-18 DIAGNOSIS — K229 Disease of esophagus, unspecified: Secondary | ICD-10-CM

## 2016-06-18 DIAGNOSIS — K5730 Diverticulosis of large intestine without perforation or abscess without bleeding: Secondary | ICD-10-CM | POA: Diagnosis not present

## 2016-06-18 DIAGNOSIS — Z1211 Encounter for screening for malignant neoplasm of colon: Secondary | ICD-10-CM

## 2016-06-18 DIAGNOSIS — D122 Benign neoplasm of ascending colon: Secondary | ICD-10-CM

## 2016-06-18 DIAGNOSIS — R1310 Dysphagia, unspecified: Secondary | ICD-10-CM | POA: Diagnosis not present

## 2016-06-18 DIAGNOSIS — Z1212 Encounter for screening for malignant neoplasm of rectum: Secondary | ICD-10-CM

## 2016-06-18 MED ORDER — SODIUM CHLORIDE 0.9 % IV SOLN: 500.0000 mL | INTRAVENOUS | Status: DC

## 2016-06-18 NOTE — Patient Instructions (Signed)
Impression/recommendations:  Polyps (handout given) Diverticulosis (handout given) High Fiber diet (handout given)  Hiatal hernia (handout given)  YOU HAD AN ENDOSCOPIC PROCEDURE TODAY AT [facility]:   Refer to the procedure report that was given to you for any specific questions about what was found during the examination.  If the procedure report does not answer your questions, please call your gastroenterologist to clarify.  If you requested that your care partner not be given the details of your procedure findings, then the procedure report has been included in a sealed envelope for you to review at your convenience later.  YOU SHOULD EXPECT: Some feelings of bloating in the abdomen. Passage of more gas than usual.  Walking can help get rid of the air that was put into your GI tract during the procedure and reduce the bloating. If you had a lower endoscopy (such as a colonoscopy or flexible sigmoidoscopy) you may notice spotting of blood in your stool or on the toilet paper. If you underwent a bowel prep for your procedure, you may not have a normal bowel movement for a few days.  Please Note:  You might notice some irritation and congestion in your nose or some drainage.  This is from the oxygen used during your procedure.  There is no need for concern and it should clear up in a day or so.  SYMPTOMS TO REPORT IMMEDIATELY:   Following lower endoscopy (colonoscopy or flexible sigmoidoscopy):  Excessive amounts of blood in the stool  Significant tenderness or worsening of abdominal pains  Swelling of the abdomen that is new, acute  Fever of 100F or higher   Following upper endoscopy (EGD)  Vomiting of blood or coffee ground material  New chest pain or pain under the shoulder blades  Painful or persistently difficult swallowing  New shortness of breath  Fever of 100F or higher  Black, tarry-looking stools  For urgent or emergent issues, a gastroenterologist can  be reached at any hour by calling [phone].   DIET:  We do recommend a small meal at first, but then you may proceed to your regular diet.  Drink plenty of fluids but you should avoid alcoholic beverages for 24 hours.  ACTIVITY:  You should plan to take it easy for the rest of today and you should NOT DRIVE or use heavy machinery until tomorrow (because of the sedation medicines used during the test).    FOLLOW UP: Our staff will call the number listed on your records the next business day following your procedure to check on you and address any questions or concerns that you may have regarding the information given to you following your procedure. If we do not reach you, we will leave a message.  However, if you are feeling well and you are not experiencing any problems, there is no need to return our call.  We will assume that you have returned to your regular daily activities without incident.  If any biopsies were taken you will be contacted by phone or by letter within the next 1-3 weeks.  Please call us at [phone] if you have not heard about the biopsies in 3 weeks.    SIGNATURES/CONFIDENTIALITY: You and/or your care partner have signed paperwork which will be entered into your electronic medical record.  These signatures attest to the fact that that the information above on your After Visit Summary has been reviewed and is understood.  Full responsibility of the confidentiality of this discharge  information lies with you and/or your care-partner. 

## 2016-06-18 NOTE — Progress Notes (Signed)
Report to PACU, RN, vss, BBS= Clear.  

## 2016-06-18 NOTE — Progress Notes (Signed)
Called to room to assist during endoscopic procedure.  Patient ID and intended procedure confirmed with present staff. Received instructions for my participation in the procedure from the performing physician.  

## 2016-06-18 NOTE — Op Note (Signed)
[name] Procedure Date: 06/18/2016 2:55 PM MRN: [id number] Endoscopist: Milus Banister , MD Age: 66 Referring MD:  Date of Birth: Aug 16, 1950 Gender: Male Account #: [id number] Procedure:                Upper GI endoscopy Indications:              Dysphagia Medicines:                Monitored Anesthesia Care Procedure:                Pre-Anesthesia Assessment:                           - Prior to the procedure, a History and Physical                            was performed, and patient medications and                            allergies were reviewed. The patient's tolerance of                            previous anesthesia was also reviewed. The risks                            and benefits of the procedure and the sedation                            options and risks were discussed with the patient.                            All questions were answered, and informed consent                            was obtained. Prior Anticoagulants: The patient has                            taken no previous anticoagulant or antiplatelet                            agents. ASA Grade Assessment: II - A patient with                            mild systemic disease. After reviewing the risks                            and benefits, the patient was deemed in                            satisfactory condition to undergo the procedure.                           After obtaining informed consent, the endoscope was  passed under direct vision. Throughout the                            procedure, the patient's blood pressure, pulse, and                            oxygen saturations were monitored continuously. The                            Model GIF-HQ190 [id number]) scope was introduced                            through the mouth, and advanced to the second part                            of duodenum. The upper GI endoscopy was                         accomplished without difficulty. The patient                            tolerated the procedure well. Scope In: Scope Out: Findings:                 The anatomy of throat was slightly abnormal,                            twisted just above the vocal cords. See pics.                           Mild mucosal changes characterized by granularity                            were found in the entire esophagus. Biopsies were                            taken with a cold forceps for histology.                           A small hiatal hernia was present.                           The exam was otherwise without abnormality. Complications:            No immediate complications. Estimated blood loss:                            None. Estimated Blood Loss:     Estimated blood loss: none. Impression:               - Abnormal, somewhat twisted appearance to the                            throat just proximal to the vocal cords. No masses  noted.                           - Granular mucosa in the esophagus. Biopsied to                            check for Eosinophilic Esophagitis (distal and                            proximal).                           - Small hiatal hernia.                           - The examination was otherwise normal. Recommendation:           - Patient has a contact number available for                            emergencies. The signs and symptoms of potential                            delayed complications were discussed with the                            patient. Return to normal activities tomorrow.                            Written discharge instructions were provided to the                            patient.                           - Resume previous diet.                           - Continue present medications.                           - Await pathology results.                           - My office will arrange ENT  referral to the                            abnormal, twisted appearance just above your vocal                            cords. Milus Banister, MD 06/18/2016 3:27:46 PM This report has been signed electronically.

## 2016-06-18 NOTE — Op Note (Signed)
[facility] Patient Name: Dylan [name] Procedure Date: 06/18/2016 2:56 PM MRN: [id number] Endoscopist: Milus Banister , MD Age: 66 Referring MD:  Date of Birth: 1951-02-12 Gender: Male Account #: [id number] Procedure:                Colonoscopy Indications:              Screening for colorectal malignant neoplasm Medicines:                Monitored Anesthesia Care Procedure:                Pre-Anesthesia Assessment:                           - Prior to the procedure, a History and Physical                            was performed, and patient medications and                            allergies were reviewed. The patient's tolerance of                            previous anesthesia was also reviewed. The risks                            and benefits of the procedure and the sedation                            options and risks were discussed with the patient.                            All questions were answered, and informed consent                            was obtained. Prior Anticoagulants: The patient has                            taken no previous anticoagulant or antiplatelet                            agents. ASA Grade Assessment: II - A patient with                            mild systemic disease. After reviewing the risks                            and benefits, the patient was deemed in                            satisfactory condition to undergo the procedure.                           After obtaining informed consent, the colonoscope  was passed under direct vision. Throughout the                            procedure, the patient's blood pressure, pulse, and                            oxygen saturations were monitored continuously. The                            Model CF-HQ190L [id number]) scope was introduced                            through the anus and advanced to the the cecum,                            identified by  appendiceal orifice and ileocecal                            valve. The colonoscopy was performed without                            difficulty. The patient tolerated the procedure                            well. The quality of the bowel preparation was                            good. The ileocecal valve, appendiceal orifice, and                            rectum were photographed. Scope In: 2:59:30 PM Scope Out: 3:11:04 PM Scope Withdrawal Time: 0 hours 8 minutes 40 seconds  Total Procedure Duration: 0 hours 11 minutes 34 seconds  Findings:                 Two sessile polyps were found in the ascending                            colon and cecum. The polyps were 2 to 3 mm in size.                            These polyps were removed with a cold snare.                            Resection and retrieval were complete.                           Multiple small and large-mouthed diverticula were                            found in the left colon.                           The exam was otherwise without abnormality on  direct and retroflexion views. Complications:            No immediate complications. Estimated blood loss:                            None. Estimated Blood Loss:     Estimated blood loss: none. Impression:               - Two 2 to 3 mm polyps in the ascending colon and                            in the cecum, removed with a cold snare. Resected                            and retrieved.                           - Diverticulosis in the left colon.                           - The examination was otherwise normal on direct                            and retroflexion views. Recommendation:           - Patient has a contact number available for                            emergencies. The signs and symptoms of potential                            delayed complications were discussed with the                            patient. Return to normal activities  tomorrow.                            Written discharge instructions were provided to the                            patient.                           - Resume previous diet.                           - Continue present medications.                           You will receive a letter within 2-3 weeks with the                            pathology results and my final recommendations.                           If the polyp(s) is proven to be 'pre-cancerous' on  pathology, you will need repeat colonoscopy in 5                            years. If the polyp(s) is NOT 'precancerous' on                            pathology then you should repeat colon cancer                            screening in 10 years with colonoscopy without need                            for colon cancer screening by any method prior to                            then (including stool testing). Milus Banister, MD 06/18/2016 3:20:11 PM This report has been signed electronically.

## 2016-06-19 ENCOUNTER — Telehealth: Payer: Self-pay | Admitting: *Deleted

## 2016-06-19 NOTE — Telephone Encounter (Signed)
  Follow up Call-  Call back number 06/18/2016  Post procedure Call Back phone  # [phone]  Permission to leave phone message Yes  Some recent data might be hidden     Patient questions:  Do you have a fever, pain , or abdominal swelling? No. Pain Score  0 *  Have you tolerated food without any problems? Yes.    Have you been able to return to your normal activities? Yes.    Do you have any questions about your discharge instructions: Diet   No. Medications  No. Follow up visit  No.  Do you have questions or concerns about your Care? No.  Actions: * If pain score is 4 or above: No action needed, pain <4.

## 2016-06-20 ENCOUNTER — Telehealth: Payer: Self-pay

## 2016-06-20 NOTE — Telephone Encounter (Signed)
Appt 07/15/16 arrive at 10 am for a 10:10 am appt with Dr Wilburn Cornelia.  Left message on machine to call back   [facility], [place] [address] [place], [place] [address] Phone [phone] Fax [phone]  Records faxed to [facility] at [phone]

## 2016-06-20 NOTE — Telephone Encounter (Signed)
Per EGD report pt needs ENT for abnormal appearance above vocal cords.

## 2016-06-21 NOTE — Telephone Encounter (Signed)
Left message on machine to call back at both home and mobile numbers

## 2016-06-23 ENCOUNTER — Other Ambulatory Visit: Payer: Self-pay | Admitting: Adult Health

## 2016-06-24 NOTE — Telephone Encounter (Signed)
Pt has been advised of the appt at ENT he was given the appt info and the phone number and address.

## 2016-06-25 LAB — HM COLONOSCOPY

## 2016-06-27 ENCOUNTER — Other Ambulatory Visit: Payer: Self-pay | Admitting: Internal Medicine

## 2016-06-27 ENCOUNTER — Telehealth: Payer: Self-pay | Admitting: *Deleted

## 2016-06-27 ENCOUNTER — Other Ambulatory Visit: Payer: Self-pay | Admitting: Adult Health

## 2016-06-27 NOTE — Telephone Encounter (Signed)
Ok to refill for 6 months 

## 2016-06-27 NOTE — Telephone Encounter (Signed)
Called patient and informed him of colonoscopy pathology report as requested by Dr. Ardis Hughs. Patient verbalized understanding and agreement with 5 year recall.

## 2016-06-27 NOTE — Telephone Encounter (Signed)
-----   Message from Milus Banister, MD sent at 06/27/2016 10:15 AM EDT ----- Regarding: RE: path letter Just wrote a results note but forgot to comment on his colon polyps.  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  Please convert this to phone note for documentation, thanks  dj  ----- Message ----- From: Kimber Relic, RN Sent: 06/27/2016   9:11 AM To: Milus Banister, MD Subject: path letter                                    Hey Dr. Ardis Hughs, Path. is back on this patient. This is the only remaining patient of 4/24 without a path. Letter. Thanks, KT

## 2016-07-01 ENCOUNTER — Other Ambulatory Visit: Payer: Self-pay | Admitting: Adult Health

## 2016-07-02 NOTE — Telephone Encounter (Signed)
Pt already has refill apri 30 2018

## 2016-07-07 ENCOUNTER — Other Ambulatory Visit: Payer: Self-pay | Admitting: Internal Medicine

## 2016-07-11 ENCOUNTER — Other Ambulatory Visit: Payer: Self-pay | Admitting: Internal Medicine

## 2016-07-11 ENCOUNTER — Encounter: Payer: Self-pay | Admitting: Internal Medicine

## 2016-07-12 ENCOUNTER — Other Ambulatory Visit: Payer: Self-pay | Admitting: Internal Medicine

## 2016-07-12 ENCOUNTER — Other Ambulatory Visit: Payer: Self-pay | Admitting: Adult Health

## 2016-07-12 DIAGNOSIS — J0140 Acute pansinusitis, unspecified: Secondary | ICD-10-CM

## 2016-07-12 NOTE — Telephone Encounter (Signed)
_Pt called requesting status on refill . Inform MD is out of office pt is wanting refill today due to being out of medication. Pls advise on refill...Dylan [name]

## 2016-07-12 NOTE — Telephone Encounter (Signed)
Faxed script back to [facility].../lmb 

## 2016-07-12 NOTE — Telephone Encounter (Signed)
Done hardcopy to [facility]

## 2016-07-13 ENCOUNTER — Other Ambulatory Visit: Payer: Self-pay | Admitting: Internal Medicine

## 2016-07-13 DIAGNOSIS — M159 Polyosteoarthritis, unspecified: Secondary | ICD-10-CM

## 2016-07-13 DIAGNOSIS — G4730 Sleep apnea, unspecified: Principal | ICD-10-CM

## 2016-07-13 DIAGNOSIS — M150 Primary generalized (osteo)arthritis: Principal | ICD-10-CM

## 2016-07-13 DIAGNOSIS — G4700 Insomnia, unspecified: Secondary | ICD-10-CM

## 2016-07-13 MED ORDER — ZOLPIDEM TARTRATE 10 MG PO TABS: 10.0000 mg | ORAL_TABLET | Freq: Every evening | ORAL | 3 refills | Status: DC | PRN

## 2016-07-13 NOTE — Telephone Encounter (Signed)
Ok to refill 

## 2016-07-15 ENCOUNTER — Other Ambulatory Visit: Payer: Self-pay | Admitting: Internal Medicine

## 2016-07-15 ENCOUNTER — Encounter: Payer: Self-pay | Admitting: Internal Medicine

## 2016-07-15 DIAGNOSIS — M150 Primary generalized (osteo)arthritis: Principal | ICD-10-CM

## 2016-07-15 DIAGNOSIS — M159 Polyosteoarthritis, unspecified: Secondary | ICD-10-CM

## 2016-07-15 DIAGNOSIS — G4730 Sleep apnea, unspecified: Principal | ICD-10-CM

## 2016-07-15 DIAGNOSIS — G4700 Insomnia, unspecified: Secondary | ICD-10-CM

## 2016-07-15 NOTE — Telephone Encounter (Signed)
This has been faxed to [facility]

## 2016-08-06 ENCOUNTER — Encounter: Payer: Self-pay | Admitting: Internal Medicine

## 2016-08-06 ENCOUNTER — Other Ambulatory Visit: Payer: Self-pay | Admitting: Internal Medicine

## 2016-08-19 ENCOUNTER — Other Ambulatory Visit: Payer: Self-pay | Admitting: Adult Health

## 2016-08-19 DIAGNOSIS — J0140 Acute pansinusitis, unspecified: Secondary | ICD-10-CM

## 2016-08-20 NOTE — Telephone Encounter (Signed)
Dr. Jones patient.   ° °

## 2016-09-08 ENCOUNTER — Other Ambulatory Visit: Payer: Self-pay | Admitting: Internal Medicine

## 2016-09-23 ENCOUNTER — Other Ambulatory Visit: Payer: Self-pay | Admitting: Adult Health

## 2016-10-08 ENCOUNTER — Encounter: Payer: Self-pay | Admitting: Family Medicine

## 2016-10-21 ENCOUNTER — Encounter: Payer: Self-pay | Admitting: Internal Medicine

## 2016-10-21 ENCOUNTER — Other Ambulatory Visit: Payer: Self-pay | Admitting: Internal Medicine

## 2016-10-21 DIAGNOSIS — I10 Essential (primary) hypertension: Secondary | ICD-10-CM

## 2016-10-21 DIAGNOSIS — E119 Type 2 diabetes mellitus without complications: Secondary | ICD-10-CM

## 2016-10-21 MED ORDER — IRBESARTAN 75 MG PO TABS: 75.0000 mg | ORAL_TABLET | Freq: Every day | ORAL | 1 refills | Status: DC

## 2016-10-31 ENCOUNTER — Encounter: Payer: Self-pay | Admitting: Internal Medicine

## 2016-10-31 ENCOUNTER — Other Ambulatory Visit: Payer: Self-pay | Admitting: Internal Medicine

## 2016-10-31 DIAGNOSIS — M150 Primary generalized (osteo)arthritis: Principal | ICD-10-CM

## 2016-10-31 DIAGNOSIS — M159 Polyosteoarthritis, unspecified: Secondary | ICD-10-CM

## 2016-11-01 ENCOUNTER — Encounter: Payer: Self-pay | Admitting: Internal Medicine

## 2016-11-01 ENCOUNTER — Other Ambulatory Visit: Payer: Self-pay | Admitting: Internal Medicine

## 2016-11-11 ENCOUNTER — Other Ambulatory Visit (INDEPENDENT_AMBULATORY_CARE_PROVIDER_SITE_OTHER): Payer: Commercial Managed Care - PPO

## 2016-11-11 ENCOUNTER — Encounter: Payer: Self-pay | Admitting: Internal Medicine

## 2016-11-11 ENCOUNTER — Ambulatory Visit (INDEPENDENT_AMBULATORY_CARE_PROVIDER_SITE_OTHER): Payer: Commercial Managed Care - PPO | Admitting: Internal Medicine

## 2016-11-11 ENCOUNTER — Ambulatory Visit: Payer: Self-pay | Admitting: Internal Medicine

## 2016-11-11 VITALS — BP 134/74 | HR 96 | Temp 98.3°F | Resp 16 | Ht 71.0 in | Wt 222.8 lb

## 2016-11-11 DIAGNOSIS — M159 Polyosteoarthritis, unspecified: Secondary | ICD-10-CM

## 2016-11-11 DIAGNOSIS — E119 Type 2 diabetes mellitus without complications: Secondary | ICD-10-CM

## 2016-11-11 DIAGNOSIS — E785 Hyperlipidemia, unspecified: Secondary | ICD-10-CM

## 2016-11-11 DIAGNOSIS — M150 Primary generalized (osteo)arthritis: Secondary | ICD-10-CM | POA: Diagnosis not present

## 2016-11-11 DIAGNOSIS — N400 Enlarged prostate without lower urinary tract symptoms: Secondary | ICD-10-CM

## 2016-11-11 DIAGNOSIS — I10 Essential (primary) hypertension: Secondary | ICD-10-CM

## 2016-11-11 DIAGNOSIS — Z0000 Encounter for general adult medical examination without abnormal findings: Secondary | ICD-10-CM

## 2016-11-11 DIAGNOSIS — Z1159 Encounter for screening for other viral diseases: Secondary | ICD-10-CM

## 2016-11-11 LAB — BASIC METABOLIC PANEL
BUN: 19 mg/dL (ref 6–23)
CHLORIDE: 105 meq/L (ref 96–112)
CO2: 26 meq/L (ref 19–32)
Calcium: 9.7 mg/dL (ref 8.4–10.5)
Creatinine, Ser: 1.03 mg/dL (ref 0.40–1.50)
GFR: 77.07 mL/min (ref >60.00)
Glucose, Bld: 128 mg/dL — ABNORMAL HIGH (ref 70–99)
POTASSIUM: 4.5 meq/L (ref 3.5–5.1)
Sodium: 140 mEq/L (ref 135–145)

## 2016-11-11 LAB — LDL CHOLESTEROL, DIRECT: LDL DIRECT: 142 mg/dL

## 2016-11-11 LAB — LIPID PANEL
CHOLESTEROL: 254 mg/dL — AB (ref 0–200)
HDL: 62.5 mg/dL (ref >39.00)
NonHDL: 191.27
Total CHOL/HDL Ratio: 4
Triglycerides: 350 mg/dL — ABNORMAL HIGH (ref 0.0–149.0)
VLDL: 70 mg/dL — AB (ref 0.0–40.0)

## 2016-11-11 LAB — HEMOGLOBIN A1C: Hgb A1c MFr Bld: 5.9 % (ref 4.6–6.5)

## 2016-11-11 LAB — PSA: PSA: 1.35 ng/mL (ref 0.10–4.00)

## 2016-11-11 MED ORDER — TRAMADOL HCL 50 MG PO TABS: 50.0000 mg | ORAL_TABLET | Freq: Four times a day (QID) | ORAL | 5 refills | Status: DC | PRN

## 2016-11-11 NOTE — Patient Instructions (Signed)

## 2016-11-11 NOTE — Progress Notes (Signed)
Subjective:  Patient ID: Dylan [name], male    DOB: 05-27-1950  Age: 65 y.o. MRN: [id number]  CC: Annual Exam; Diabetes; Hypertension; Osteoarthritis; and Hyperlipidemia   HPI Dylan [name] presents for a CPX.  He complains of chronic pain in his low back and large joints and wants a refill of tramadol. He has not been taking the statin recently. He tells me when he did take it it didn't cause any symptoms like muscle aches. He plays golf and walks his dog and denies any recent episodes of DOE, CP, palpitations, edema, or fatigue.  Outpatient Medications Prior to Visit  Medication Sig Dispense Refill  . amLODipine (NORVASC) 5 MG tablet TAKE 1 TABLET BY MOUTH DAILY 90 tablet 3  . aspirin EC 81 MG tablet Take 1 tablet (81 mg total) by mouth daily.    Marland Kitchen dutasteride (AVODART) 0.5 MG capsule daily.    Marland Kitchen esomeprazole (NEXIUM) 40 MG capsule TAKE ONE CAPSULE BY MOUTH EVERY DAY BEFORE BREAKFAST 90 capsule 1  . irbesartan (AVAPRO) 75 MG tablet Take 1 tablet (75 mg total) by mouth daily. 90 tablet 1  . Melatonin 5 MG TABS Take 1 tablet by mouth at bedtime.     . tamsulosin (FLOMAX) 0.4 MG CAPS capsule TAKE 1 CAPSULE (0.4 MG TOTAL) BY MOUTH DAILY. 90 capsule 3  . zolpidem (AMBIEN) 10 MG tablet Take 1 tablet (10 mg total) by mouth at bedtime as needed. 30 tablet 3  . metFORMIN (GLUCOPHAGE) 500 MG tablet TAKE 1 TABLET BY MOUTH TWICE A DAY 180 tablet 0  . pravastatin (PRAVACHOL) 20 MG tablet Take 1 tablet (20 mg total) by mouth daily. 90 tablet 1  . traMADol (ULTRAM) 50 MG tablet TAKE 1 TABLET BY MOUTH EVERY 6 HOURS AS NEEDED 90 tablet 4  . fluticasone (FLONASE) 50 MCG/ACT nasal spray SPRAY 2 SPRAYS INTO EACH NOSTRIL EVERY DAY 16 g 11  . gabapentin (NEURONTIN) 600 MG tablet Take 600 mg by mouth 3 (three) times daily.     No facility-administered medications prior to visit.     ROS Review of Systems  Constitutional: Negative.  Negative for chills, diaphoresis, fatigue and unexpected weight  change.  HENT: Negative.  Negative for trouble swallowing.   Eyes: Negative.   Respiratory: Negative for apnea, cough, shortness of breath and wheezing.   Cardiovascular: Negative for chest pain, palpitations and leg swelling.  Gastrointestinal: Negative for abdominal pain, constipation, diarrhea, nausea and vomiting.  Endocrine: Negative.   Genitourinary: Negative.  Negative for difficulty urinating, dysuria, hematuria, penile swelling, scrotal swelling and urgency.  Musculoskeletal: Positive for arthralgias and back pain. Negative for myalgias and neck pain.  Skin: Negative.   Allergic/Immunologic: Negative.   Neurological: Negative.  Negative for dizziness, weakness and numbness.  Hematological: Negative for adenopathy. Does not bruise/bleed easily.  Psychiatric/Behavioral: Negative.     Objective:  BP 134/74 (BP Location: Left Arm, Patient Position: Sitting, Cuff Size: Normal)   Pulse 96   Temp 98.3 F (36.8 C) (Oral)   Resp 16   Ht 5\' 11"  (1.803 m)   Wt 222 lb 12 oz (101 kg)   SpO2 98%   BMI 31.07 kg/m   BP Readings from Last 3 Encounters:  11/11/16 134/74  06/18/16 120/70  06/07/16 138/76    Wt Readings from Last 3 Encounters:  11/11/16 222 lb 12 oz (101 kg)  06/18/16 227 lb (103 kg)  06/07/16 234 lb (106.1 kg)    Physical Exam  Constitutional: He is  oriented to person, place, and time. No distress.  HENT:  Mouth/Throat: Oropharynx is clear and moist. No oropharyngeal exudate.  Eyes: Conjunctivae are normal. Right eye exhibits no discharge. Left eye exhibits no discharge. No scleral icterus.  Neck: Normal range of motion. Neck supple. No JVD present. No thyromegaly present.  Cardiovascular: Normal rate, regular rhythm and intact distal pulses.  Exam reveals no gallop and no friction rub.   No murmur heard. EKG -  Sinus  Rhythm  -Incomplete right bundle branch block and left axis -anterior fascicular block.   ABNORMAL - no change from prior EKG 01/2015    Pulmonary/Chest: Effort normal and breath sounds normal. No respiratory distress. He has no wheezes. He has no rales. He exhibits no tenderness.  Abdominal: Soft. Bowel sounds are normal. He exhibits no distension and no mass. There is no tenderness. There is no rebound and no guarding. Hernia confirmed negative in the right inguinal area and confirmed negative in the left inguinal area.  Genitourinary: Rectum normal, prostate normal, testes normal and penis normal. Rectal exam shows no external hemorrhoid, no internal hemorrhoid, no fissure, no mass, no tenderness, anal tone normal and guaiac negative stool. Prostate is not enlarged and not tender. Right testis shows no mass, no swelling and no tenderness. Right testis is descended. Left testis shows no mass, no swelling and no tenderness. Left testis is descended. Circumcised. No penile erythema or penile tenderness. No discharge found.  Musculoskeletal: Normal range of motion. He exhibits no edema, tenderness or deformity.  Lymphadenopathy:    He has no cervical adenopathy.       Right: No inguinal adenopathy present.       Left: No inguinal adenopathy present.  Neurological: He is alert and oriented to person, place, and time.  Skin: Skin is warm and dry. No rash noted. He is not diaphoretic. No erythema. No pallor.  Psychiatric: He has a normal mood and affect. His behavior is normal. Judgment and thought content normal.  Vitals reviewed.   Lab Results  Component Value Date   WBC 8.5 04/16/2016   HGB 14.2 04/16/2016   HCT 41.8 04/16/2016   PLT 256.0 04/16/2016   GLUCOSE 128 (H) 11/11/2016   CHOL 254 (H) 11/11/2016   TRIG 350.0 (H) 11/11/2016   HDL 62.50 11/11/2016   LDLDIRECT 142.0 11/11/2016   LDLCALC 89 02/15/2014   ALT 22 04/16/2016   AST 16 04/16/2016   NA 140 11/11/2016   K 4.5 11/11/2016   CL 105 11/11/2016   CREATININE 1.03 11/11/2016   BUN 19 11/11/2016   CO2 26 11/11/2016   TSH 1.691 01/30/2015   PSA 1.35 11/11/2016    INR 1.02 10/16/2012   HGBA1C 5.9 11/11/2016   MICROALBUR <0.7 03/05/2016    Dg Abd Acute W/chest  Result Date: 04/16/2016 CLINICAL DATA:  66 year old male with a history of nausea for 6 weeks EXAM: DG ABDOMEN ACUTE W/ 1V CHEST COMPARISON:  09/28/2015, 06/08/2015, 10/16/2012 FINDINGS: Chest: Cardiomediastinal silhouette unchanged in size and contour. No evidence of central vascular congestion or interlobular septal thickening. No pneumothorax. No large pleural effusion. No confluent airspace disease. Abdomen: Surgical changes overlying L4 on L5, similar to comparison studies. Gas within stomach, small bowel, colon, without abnormal distention. Small volume of gas in the rectum. No unexpected radiopaque foreign body. No unexpected calcifications. No displaced fracture. IMPRESSION: Chest: No radiographic evidence of acute cardiopulmonary disease. Abdomen: Nonobstructive bowel gas pattern with mild stool burden. Surgical changes of L4 and L5 Signed, York Cerise  Pasty Arch, DO [facility] Electronically Signed   By: Corrie Mckusick D.O.   On: 04/16/2016 17:25    Assessment & Plan:   Italo was seen today for annual exam, diabetes, hypertension, osteoarthritis and hyperlipidemia.  Diagnoses and all orders for this visit:  Diabetes mellitus without complication ([place])- his W4Y is down to 5.9%, will d'c metformin -     Hemoglobin A1c; Future -     Basic metabolic panel; Future  Essential hypertension- his BP is well controlled, lytes and renal fxn are normal. His EKG is neg for LVH or ischemia. -     Basic metabolic panel; Future -     EKG 12-Lead  Benign prostatic hyperplasia without lower urinary tract symptoms- his PSA remains low so I am not concerned about prostate cancer, he has no sx's that need to be treated  Routine general medical examination at a health care facility -     Lipid panel; Future -     PSA; Future -     HIV antibody;  Future  Need for hepatitis C screening test -     Hepatitis C antibody; Future  Primary osteoarthritis involving multiple joints -     traMADol (ULTRAM) 50 MG tablet; Take 1 tablet (50 mg total) by mouth every 6 (six) hours as needed.  Hyperlipidemia LDL goal <100- he has an elevated ASCVD risk score, will restart with a more potent statin -     rosuvastatin (CRESTOR) 10 MG tablet; Take 1 tablet (10 mg total) by mouth daily.   I have discontinued Mr. Delman gabapentin, pravastatin, fluticasone, and metFORMIN. I have also changed his traMADol. Additionally, I am having him start on rosuvastatin. Lastly, I am having him maintain his aspirin EC, Melatonin, amLODipine, dutasteride, tamsulosin, esomeprazole, zolpidem, and irbesartan.  Meds ordered this encounter  Medications  . traMADol (ULTRAM) 50 MG tablet    Sig: Take 1 tablet (50 mg total) by mouth every 6 (six) hours as needed.    Dispense:  90 tablet    Refill:  5    This request is for a new prescription for a controlled substance as required by Federal/State law.  . rosuvastatin (CRESTOR) 10 MG tablet    Sig: Take 1 tablet (10 mg total) by mouth daily.    Dispense:  90 tablet    Refill:  1     Follow-up: Return in about 6 months (around 05/11/2017).  Scarlette Calico, MD

## 2016-11-12 ENCOUNTER — Encounter: Payer: Self-pay | Admitting: Internal Medicine

## 2016-11-12 LAB — HEPATITIS C ANTIBODY
Hepatitis C Ab: NONREACTIVE
SIGNAL TO CUT-OFF: 0.01 (ref <1.00)

## 2016-11-12 LAB — HIV ANTIBODY (ROUTINE TESTING W REFLEX): HIV 1&2 Ab, 4th Generation: NONREACTIVE

## 2016-11-12 MED ORDER — ROSUVASTATIN CALCIUM 10 MG PO TABS: 10.0000 mg | ORAL_TABLET | Freq: Every day | ORAL | 1 refills | Status: DC

## 2016-11-28 ENCOUNTER — Other Ambulatory Visit: Payer: Self-pay | Admitting: Internal Medicine

## 2016-11-28 DIAGNOSIS — G4730 Sleep apnea, unspecified: Principal | ICD-10-CM

## 2016-11-28 DIAGNOSIS — G4700 Insomnia, unspecified: Secondary | ICD-10-CM

## 2016-11-29 NOTE — Telephone Encounter (Signed)
Can you advise in PCP absence.  

## 2016-11-30 NOTE — Telephone Encounter (Signed)
controlled substance database checked.  Ok to fill medication. rx printed 

## 2016-12-02 NOTE — Telephone Encounter (Signed)
RX faxed to [facility] 

## 2016-12-04 ENCOUNTER — Other Ambulatory Visit: Payer: Self-pay | Admitting: Adult Health

## 2016-12-05 ENCOUNTER — Other Ambulatory Visit: Payer: Self-pay | Admitting: Internal Medicine

## 2016-12-13 ENCOUNTER — Other Ambulatory Visit: Payer: Self-pay | Admitting: Internal Medicine

## 2017-01-24 ENCOUNTER — Other Ambulatory Visit: Payer: Self-pay | Admitting: Adult Health

## 2017-01-24 ENCOUNTER — Other Ambulatory Visit: Payer: Self-pay | Admitting: Internal Medicine

## 2017-01-24 DIAGNOSIS — G4700 Insomnia, unspecified: Secondary | ICD-10-CM

## 2017-01-24 DIAGNOSIS — G4730 Sleep apnea, unspecified: Principal | ICD-10-CM

## 2017-01-24 NOTE — Telephone Encounter (Signed)
Dr. Jones patient.   ° °

## 2017-02-25 HISTORY — PX: ELBOW SURGERY: SHX618

## 2017-03-02 IMAGING — DX DG ABDOMEN ACUTE W/ 1V CHEST
3 series · 3 of 3 positions shown · non-contrast
Comparison: 09/28/2015, 06/08/2015, 10/16/2012

CLINICAL DATA: 65-year-old male with a history of nausea for 6
weeks

EXAM:
DG ABDOMEN ACUTE W/ 1V CHEST

[chest pa]
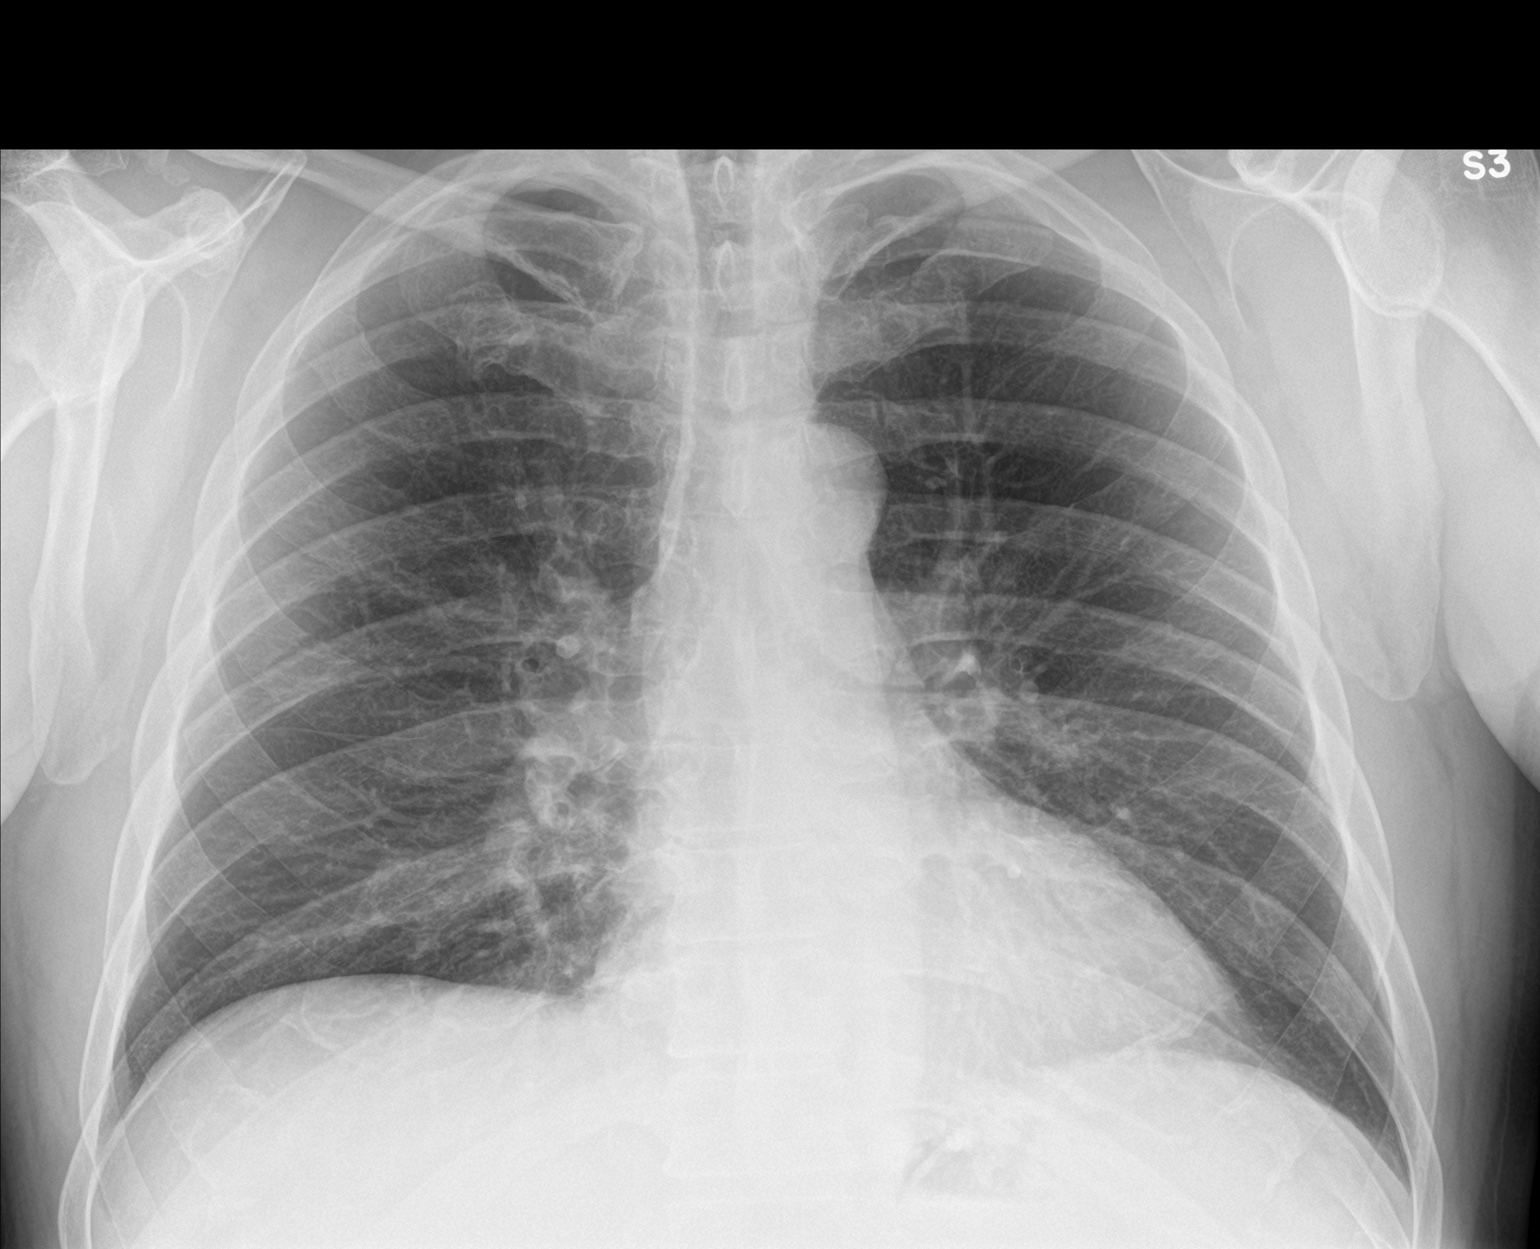

[abdomen erect]
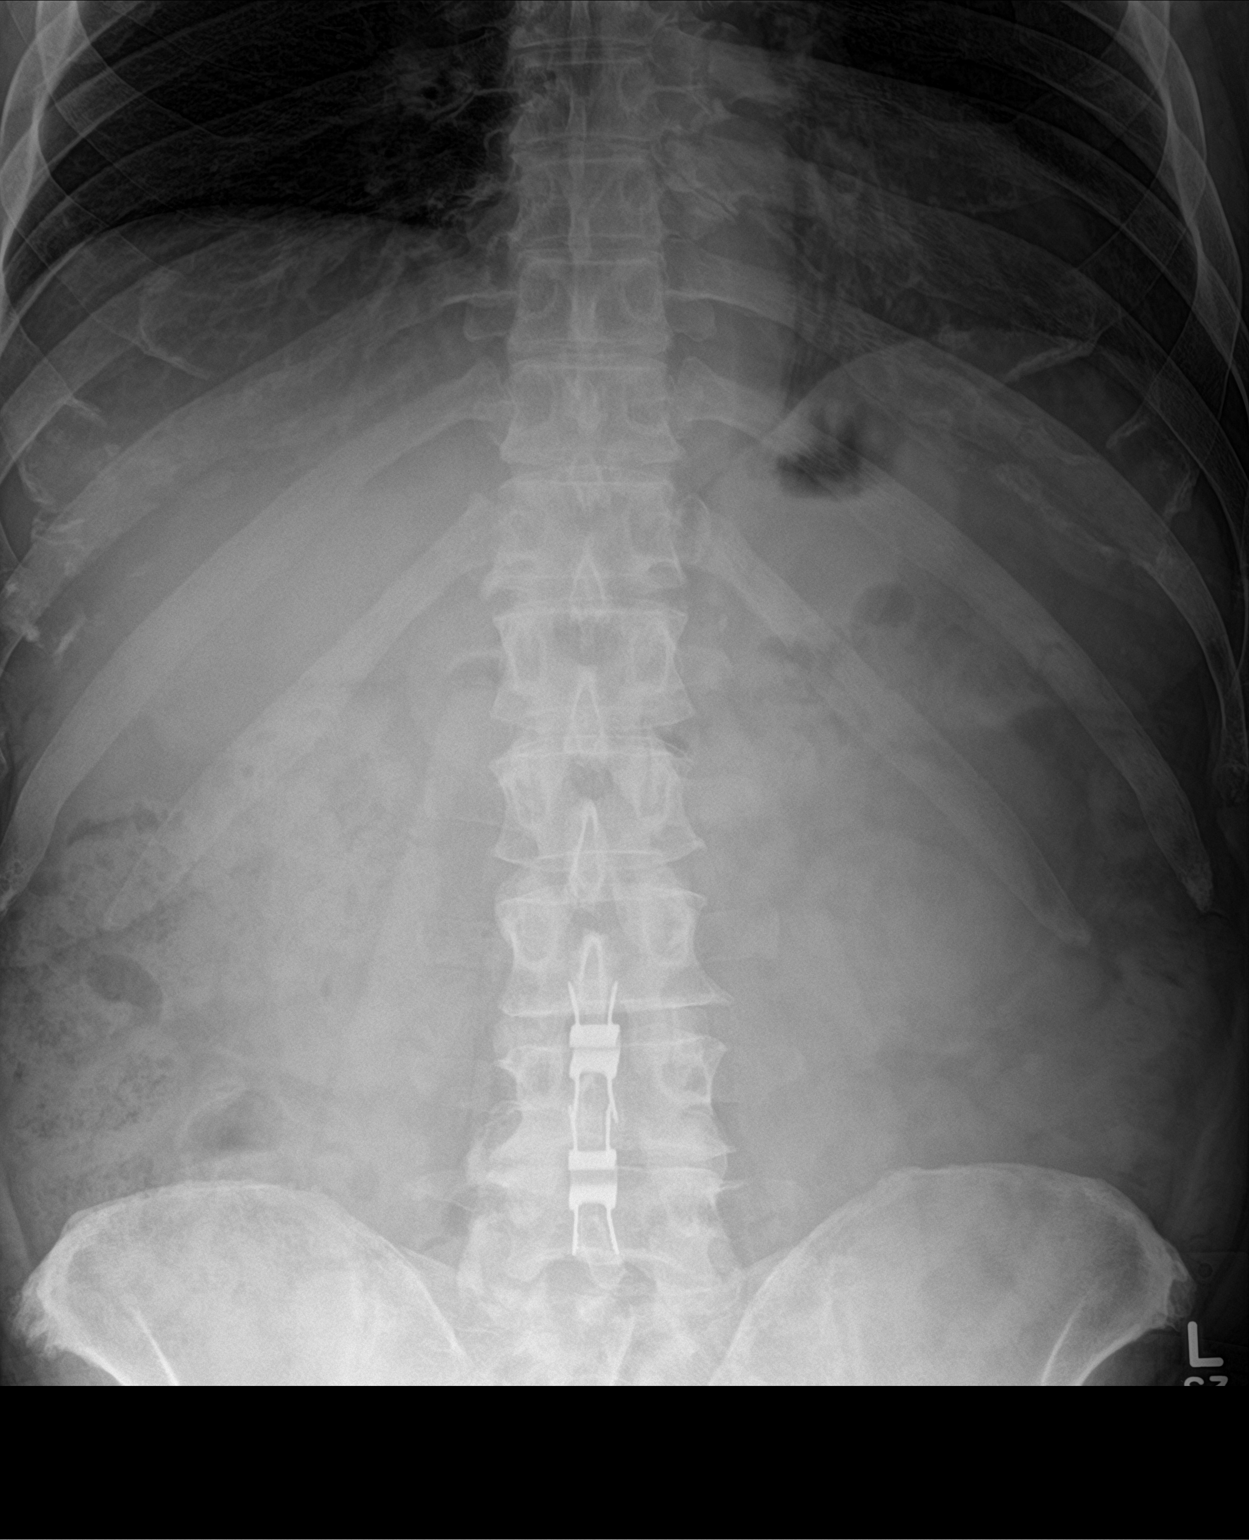

[abdomen supine]
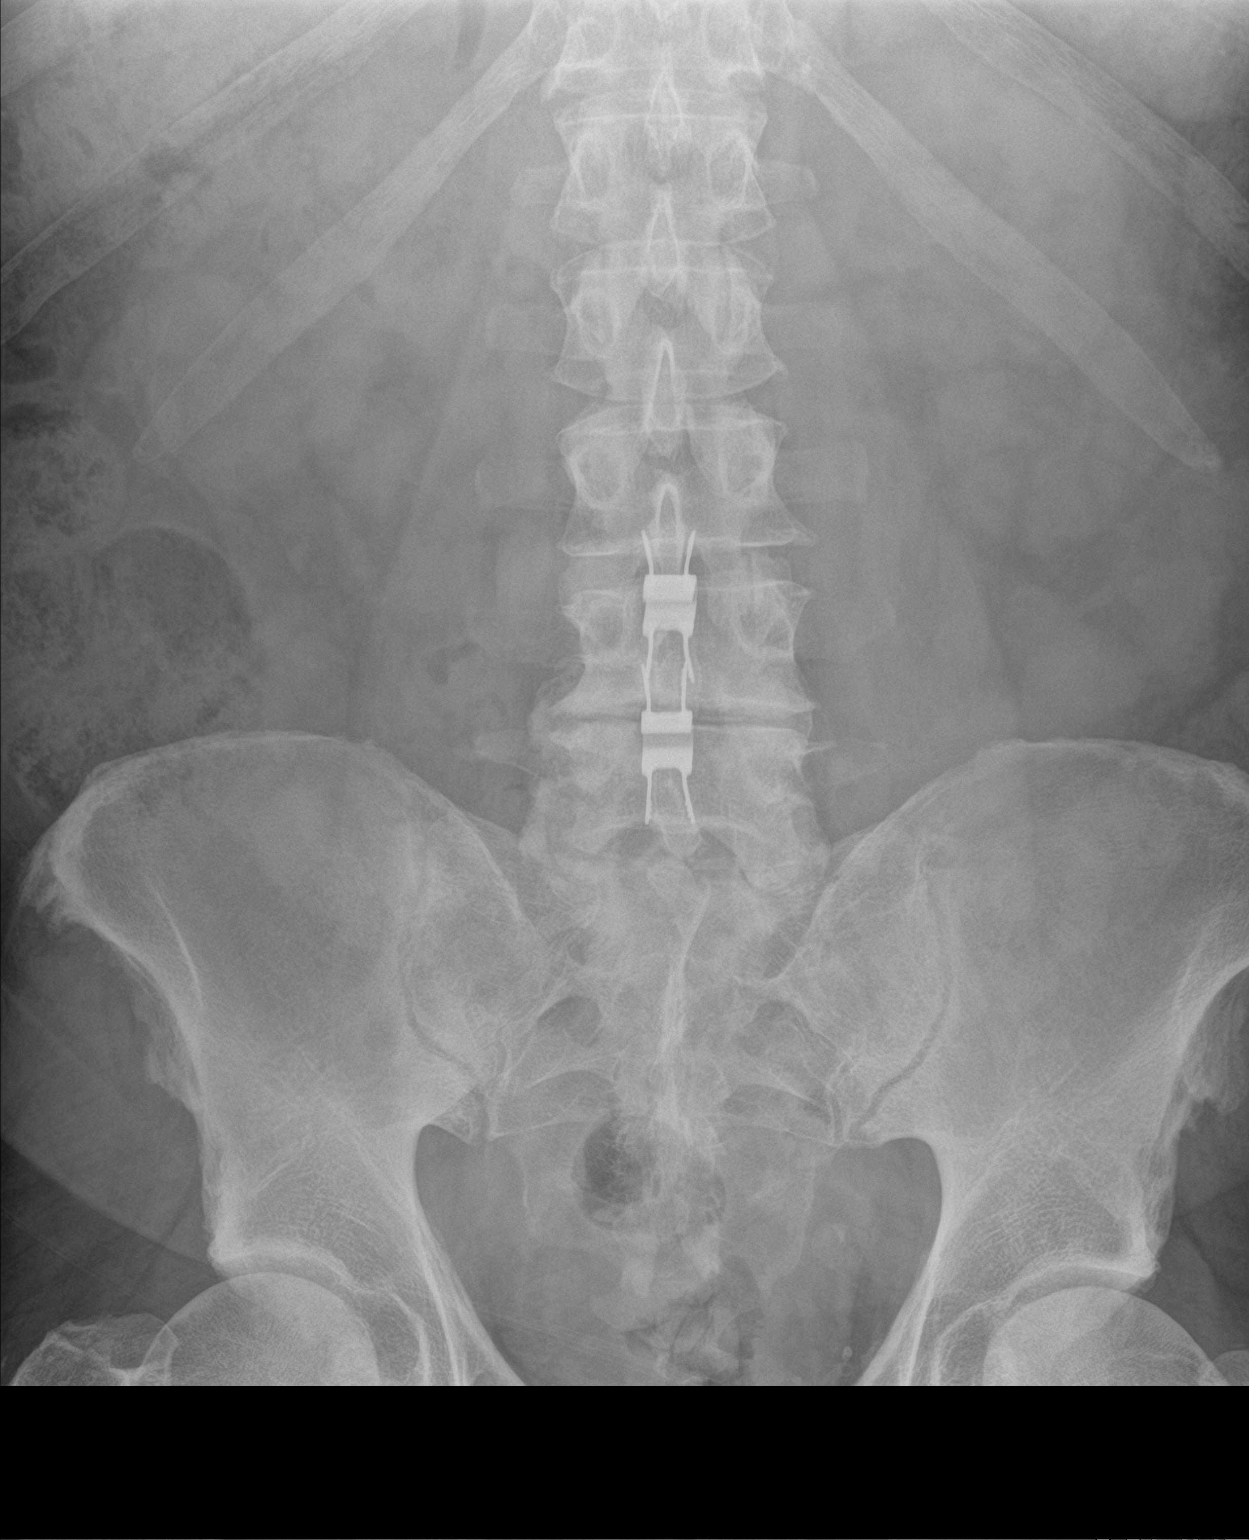

[3 of 3 positions shown; findings below may reference images not displayed]

FINDINGS: Chest:

Cardiomediastinal silhouette unchanged in size and contour. No
evidence of central vascular congestion or interlobular septal
thickening.

No pneumothorax. No large pleural effusion. No confluent airspace
disease.

Abdomen:

Surgical changes overlying L4 on L5, similar to comparison studies.

Gas within stomach, small bowel, colon, without abnormal distention.
Small volume of gas in the rectum.

No unexpected radiopaque foreign body. No unexpected calcifications.

No displaced fracture.
IMPRESSION: Chest:

No radiographic evidence of acute cardiopulmonary disease.

Abdomen:

Nonobstructive bowel gas pattern with mild stool burden.

Surgical changes of L4 and L5

## 2017-03-12 ENCOUNTER — Ambulatory Visit (INDEPENDENT_AMBULATORY_CARE_PROVIDER_SITE_OTHER): Payer: Medicare HMO | Admitting: Internal Medicine

## 2017-03-12 ENCOUNTER — Encounter: Payer: Self-pay | Admitting: Internal Medicine

## 2017-03-12 VITALS — BP 150/90 | HR 87 | Temp 97.8°F | Ht 71.0 in | Wt 240.0 lb

## 2017-03-12 DIAGNOSIS — M5412 Radiculopathy, cervical region: Secondary | ICD-10-CM

## 2017-03-12 DIAGNOSIS — M48062 Spinal stenosis, lumbar region with neurogenic claudication: Secondary | ICD-10-CM

## 2017-03-12 DIAGNOSIS — G5601 Carpal tunnel syndrome, right upper limb: Secondary | ICD-10-CM | POA: Diagnosis not present

## 2017-03-12 DIAGNOSIS — M4692 Unspecified inflammatory spondylopathy, cervical region: Secondary | ICD-10-CM

## 2017-03-12 DIAGNOSIS — M150 Primary generalized (osteo)arthritis: Secondary | ICD-10-CM

## 2017-03-12 DIAGNOSIS — M159 Polyosteoarthritis, unspecified: Secondary | ICD-10-CM

## 2017-03-12 MED ORDER — HYDROCODONE-ACETAMINOPHEN 10-325 MG PO TABS
1.0000 | ORAL_TABLET | Freq: Four times a day (QID) | ORAL | 0 refills | Status: DC | PRN
Start: 2017-03-12 — End: 2017-03-31

## 2017-03-12 NOTE — Patient Instructions (Signed)

## 2017-03-12 NOTE — Progress Notes (Signed)
Subjective:  Patient ID: Dylan [name], male    DOB: 04-29-1950  Age: 67 y.o. MRN: [id number]  CC: Arm Pain (Pain in both arms states L arm hurts more. Pt states it's been over a year. Pain starts in neck and "shoots down to arm" )   HPI TYTUS STRAHLE presents for concerns about worsening musculoskeletal pain.  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  He also complains of a throbbing pain in both arms.  This is predominantly in the left elbow and in the right forearm.  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  He tells me he has an appointment with a hand surgeon in 10 days.  He is trying to control the pain with motrin but this is causing stomach upset.  He describes the neck pain as an intermittent jabbing sensation.  He tells me the pain keeps him awake at night and interferes with his ability to take care of his wife who is disabled with a chronic medical illness.  Outpatient Medications Prior to Visit  Medication Sig Dispense Refill  . amLODipine (NORVASC) 5 MG tablet TAKE 1 TABLET BY MOUTH DAILY 90 tablet 0  . aspirin EC 81 MG tablet Take 1 tablet (81 mg total) by mouth daily.    Marland Kitchen dutasteride (AVODART) 0.5 MG capsule daily.    Marland Kitchen esomeprazole (NEXIUM) 40 MG capsule TAKE ONE CAPSULE BY MOUTH EVERY DAY BEFORE BREAKFAST 90 capsule 1  . irbesartan (AVAPRO) 75 MG tablet Take 1 tablet (75 mg total) by mouth daily. 90 tablet 1  . Melatonin 5 MG TABS Take 1 tablet by mouth at bedtime.     . metFORMIN (GLUCOPHAGE) 500 MG tablet TAKE 1 TABLET BY MOUTH TWICE A DAY 180 tablet 0  . rosuvastatin (CRESTOR) 10 MG tablet Take 1 tablet (10 mg total) by mouth daily. 90 tablet 1  . tamsulosin (FLOMAX) 0.4 MG CAPS capsule TAKE 1 CAPSULE (0.4 MG TOTAL) BY MOUTH DAILY. 90 capsule 3  . zolpidem (AMBIEN) 10 MG tablet TAKE 1 TABLET BY MOUTH AT BEDTIME AS NEEDED 30 tablet 1  . traMADol (ULTRAM) 50  MG tablet Take 1 tablet (50 mg total) by mouth every 6 (six) hours as needed. 90 tablet 5   No facility-administered medications prior to visit.     ROS Review of Systems  Constitutional: Negative for chills, fatigue and fever.  HENT: Negative.  Negative for trouble swallowing.   Eyes: Negative.  Negative for visual disturbance.  Respiratory: Negative for cough, chest tightness, shortness of breath and wheezing.   Cardiovascular: Negative for chest pain, palpitations and leg swelling.  Gastrointestinal: Negative for abdominal pain, blood in stool, diarrhea, nausea and vomiting.  Endocrine: Negative.  Negative for cold intolerance.  Genitourinary: Negative.  Negative for difficulty urinating.  Musculoskeletal: Positive for arthralgias, back pain, neck pain and neck stiffness. Negative for gait problem, joint swelling and myalgias.  Skin: Negative.  Negative for color change.  Neurological: Negative for dizziness, weakness, light-headedness, numbness and headaches.       He complains of tingling in both forearms.  Hematological: Negative for adenopathy. Does not bruise/bleed easily.  Psychiatric/Behavioral: Negative.     Objective:  BP (!) 150/90   Pulse 87   Temp 97.8 F (36.6 C) (Oral)   Ht 5\' 11"  (1.803 m)   Wt 240 lb (108.9  kg)   SpO2 97%   BMI 33.47 kg/m   BP Readings from Last 3 Encounters:  03/12/17 (!) 150/90  11/11/16 134/74  06/18/16 120/70    Wt Readings from Last 3 Encounters:  03/12/17 240 lb (108.9 kg)  11/11/16 222 lb 12 oz (101 kg)  06/18/16 227 lb (103 kg)    Physical Exam  Constitutional: He is oriented to person, place, and time. No distress.  HENT:  Mouth/Throat: Oropharynx is clear and moist. No oropharyngeal exudate.  Eyes: Conjunctivae are normal. Left eye exhibits no discharge.  Neck: Normal range of motion. Neck supple. No JVD present. No thyromegaly present.  Cardiovascular: Normal rate, regular rhythm and normal heart sounds. Exam reveals  no gallop.  No murmur heard. Pulmonary/Chest: Effort normal and breath sounds normal. No respiratory distress. He has no wheezes. He has no rales.  Abdominal: Soft. Bowel sounds are normal. He exhibits no mass. There is no tenderness.  Musculoskeletal: Normal range of motion. He exhibits no edema, tenderness or deformity.       Cervical back: Normal. He exhibits normal range of motion, no tenderness, no bony tenderness, no swelling, no edema, no deformity and no spasm.  Lymphadenopathy:    He has no cervical adenopathy.  Neurological: He is alert and oriented to person, place, and time. He has normal strength. He displays no atrophy, no tremor and normal reflexes. No cranial nerve deficit or sensory deficit. He exhibits normal muscle tone. He displays no seizure activity. Coordination and gait normal.  Reflex Scores:      Tricep reflexes are 1+ on the right side and 1+ on the left side.      Bicep reflexes are 1+ on the right side and 1+ on the left side.      Brachioradialis reflexes are 1+ on the right side and 1+ on the left side.      Patellar reflexes are 1+ on the right side and 1+ on the left side.      Achilles reflexes are 0 on the right side and 0 on the left side. Skin: Skin is warm. No rash noted. He is not diaphoretic. No erythema. No pallor.    Lab Results  Component Value Date   WBC 8.5 04/16/2016   HGB 14.2 04/16/2016   HCT 41.8 04/16/2016   PLT 256.0 04/16/2016   GLUCOSE 128 (H) 11/11/2016   CHOL 254 (H) 11/11/2016   TRIG 350.0 (H) 11/11/2016   HDL 62.50 11/11/2016   LDLDIRECT 142.0 11/11/2016   LDLCALC 89 02/15/2014   ALT 22 04/16/2016   AST 16 04/16/2016   NA 140 11/11/2016   K 4.5 11/11/2016   CL 105 11/11/2016   CREATININE 1.03 11/11/2016   BUN 19 11/11/2016   CO2 26 11/11/2016   TSH 1.691 01/30/2015   PSA 1.35 11/11/2016   INR 1.02 10/16/2012   HGBA1C 5.9 11/11/2016   MICROALBUR <0.7 03/05/2016    Dg Abd Acute W/chest  Result Date:  04/16/2016 CLINICAL DATA:  67 year old male with a history of nausea for 6 weeks EXAM: DG ABDOMEN ACUTE W/ 1V CHEST COMPARISON:  09/28/2015, 06/08/2015, 10/16/2012 FINDINGS: Chest: Cardiomediastinal silhouette unchanged in size and contour. No evidence of central vascular congestion or interlobular septal thickening. No pneumothorax. No large pleural effusion. No confluent airspace disease. Abdomen: Surgical changes overlying L4 on L5, similar to comparison studies. Gas within stomach, small bowel, colon, without abnormal distention. Small volume of gas in the rectum. No unexpected radiopaque foreign body. No unexpected  calcifications. No displaced fracture. IMPRESSION: Chest: No radiographic evidence of acute cardiopulmonary disease. Abdomen: Nonobstructive bowel gas pattern with mild stool burden. Surgical changes of L4 and L5 Signed, Dulcy Fanny. Earleen Newport, DO [facility] Electronically Signed   By: Corrie Mckusick D.O.   On: 04/16/2016 17:25    Assessment & Plan:   Donshay was seen today for arm pain.  Diagnoses and all orders for this visit:  Primary osteoarthritis involving multiple joints -     HYDROcodone-acetaminophen (NORCO) 10-325 MG tablet; Take 1 tablet by mouth every 6 (six) hours as needed for severe pain.  Carpal tunnel syndrome of right wrist -     HYDROcodone-acetaminophen (NORCO) 10-325 MG tablet; Take 1 tablet by mouth every 6 (six) hours as needed for severe pain.  Lumbar stenosis with neurogenic claudication -     HYDROcodone-acetaminophen (NORCO) 10-325 MG tablet; Take 1 tablet by mouth every 6 (six) hours as needed for severe pain.  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  He has neurological symptoms in his upper extremities but his neuro exam today is normal.  He will see a hand surgeon in 10 days to see if there  is a complication with the carpal tunnel syndrome.  For now I have asked him to upgrade his pain relief to hydrocodone and acetaminophen. -     HYDROcodone-acetaminophen (NORCO) 10-325 MG tablet; Take 1 tablet by mouth every 6 (six) hours as needed for severe pain.   I have discontinued Elyse Jarvis. Herst's traMADol. I am also having him start on HYDROcodone-acetaminophen. Additionally, I am having him maintain his aspirin EC, Melatonin, dutasteride, tamsulosin, irbesartan, rosuvastatin, esomeprazole, metFORMIN, amLODipine, and zolpidem.  Meds ordered this encounter  Medications  . HYDROcodone-acetaminophen (NORCO) 10-325 MG tablet    Sig: Take 1 tablet by mouth every 6 (six) hours as needed for severe pain.    Dispense:  90 tablet    Refill:  0     Follow-up: Return in about 3 months (around 06/10/2017).  Scarlette Calico, MD

## 2017-03-13 ENCOUNTER — Encounter: Payer: Self-pay | Admitting: Internal Medicine

## 2017-03-21 DIAGNOSIS — G5601 Carpal tunnel syndrome, right upper limb: Secondary | ICD-10-CM | POA: Diagnosis not present

## 2017-03-21 DIAGNOSIS — M7712 Lateral epicondylitis, left elbow: Secondary | ICD-10-CM | POA: Diagnosis not present

## 2017-03-24 ENCOUNTER — Encounter: Payer: Self-pay | Admitting: Internal Medicine

## 2017-03-25 ENCOUNTER — Other Ambulatory Visit: Payer: Self-pay | Admitting: Internal Medicine

## 2017-03-25 DIAGNOSIS — G4700 Insomnia, unspecified: Secondary | ICD-10-CM

## 2017-03-25 DIAGNOSIS — G4730 Sleep apnea, unspecified: Principal | ICD-10-CM

## 2017-03-29 ENCOUNTER — Other Ambulatory Visit: Payer: Self-pay | Admitting: Internal Medicine

## 2017-03-29 DIAGNOSIS — E119 Type 2 diabetes mellitus without complications: Secondary | ICD-10-CM

## 2017-03-29 DIAGNOSIS — I10 Essential (primary) hypertension: Secondary | ICD-10-CM

## 2017-03-31 ENCOUNTER — Other Ambulatory Visit: Payer: Self-pay | Admitting: Internal Medicine

## 2017-03-31 ENCOUNTER — Encounter: Payer: Self-pay | Admitting: Internal Medicine

## 2017-03-31 DIAGNOSIS — M150 Primary generalized (osteo)arthritis: Principal | ICD-10-CM

## 2017-03-31 DIAGNOSIS — G5601 Carpal tunnel syndrome, right upper limb: Secondary | ICD-10-CM

## 2017-03-31 DIAGNOSIS — M48062 Spinal stenosis, lumbar region with neurogenic claudication: Secondary | ICD-10-CM

## 2017-03-31 DIAGNOSIS — M4692 Unspecified inflammatory spondylopathy, cervical region: Secondary | ICD-10-CM

## 2017-03-31 DIAGNOSIS — M159 Polyosteoarthritis, unspecified: Secondary | ICD-10-CM

## 2017-03-31 DIAGNOSIS — M5412 Radiculopathy, cervical region: Secondary | ICD-10-CM

## 2017-03-31 MED ORDER — HYDROCODONE-ACETAMINOPHEN 10-325 MG PO TABS: 1.0000 | ORAL_TABLET | Freq: Four times a day (QID) | ORAL | 0 refills | Status: DC | PRN

## 2017-03-31 NOTE — Telephone Encounter (Signed)
Error

## 2017-04-21 ENCOUNTER — Other Ambulatory Visit: Payer: Self-pay | Admitting: Internal Medicine

## 2017-04-22 ENCOUNTER — Encounter: Payer: Self-pay | Admitting: Internal Medicine

## 2017-04-22 ENCOUNTER — Other Ambulatory Visit: Payer: Self-pay | Admitting: Internal Medicine

## 2017-04-22 DIAGNOSIS — M5412 Radiculopathy, cervical region: Secondary | ICD-10-CM

## 2017-04-22 DIAGNOSIS — M150 Primary generalized (osteo)arthritis: Principal | ICD-10-CM

## 2017-04-22 DIAGNOSIS — G5601 Carpal tunnel syndrome, right upper limb: Secondary | ICD-10-CM

## 2017-04-22 DIAGNOSIS — M48062 Spinal stenosis, lumbar region with neurogenic claudication: Secondary | ICD-10-CM

## 2017-04-22 DIAGNOSIS — M4692 Unspecified inflammatory spondylopathy, cervical region: Secondary | ICD-10-CM

## 2017-04-22 DIAGNOSIS — M159 Polyosteoarthritis, unspecified: Secondary | ICD-10-CM

## 2017-04-22 MED ORDER — HYDROCODONE-ACETAMINOPHEN 10-325 MG PO TABS: 1.0000 | ORAL_TABLET | Freq: Four times a day (QID) | ORAL | 0 refills | Status: DC | PRN

## 2017-04-25 DIAGNOSIS — M79642 Pain in left hand: Secondary | ICD-10-CM | POA: Diagnosis not present

## 2017-04-25 DIAGNOSIS — M7712 Lateral epicondylitis, left elbow: Secondary | ICD-10-CM | POA: Diagnosis not present

## 2017-04-25 DIAGNOSIS — G5601 Carpal tunnel syndrome, right upper limb: Secondary | ICD-10-CM | POA: Diagnosis not present

## 2017-04-27 ENCOUNTER — Other Ambulatory Visit: Payer: Self-pay | Admitting: Internal Medicine

## 2017-04-28 ENCOUNTER — Encounter: Payer: Self-pay | Admitting: Internal Medicine

## 2017-04-30 ENCOUNTER — Telehealth: Payer: Self-pay

## 2017-04-30 NOTE — Telephone Encounter (Signed)
Key: [facility] [place]

## 2017-05-01 ENCOUNTER — Encounter: Payer: Self-pay | Admitting: Internal Medicine

## 2017-05-03 ENCOUNTER — Other Ambulatory Visit: Payer: Self-pay | Admitting: Internal Medicine

## 2017-05-03 DIAGNOSIS — M48062 Spinal stenosis, lumbar region with neurogenic claudication: Secondary | ICD-10-CM

## 2017-05-06 ENCOUNTER — Ambulatory Visit: Payer: Medicare HMO | Attending: Internal Medicine | Admitting: Physical Therapy

## 2017-05-06 ENCOUNTER — Encounter: Payer: Self-pay | Admitting: Physical Therapy

## 2017-05-06 ENCOUNTER — Other Ambulatory Visit: Payer: Self-pay

## 2017-05-06 DIAGNOSIS — R293 Abnormal posture: Secondary | ICD-10-CM | POA: Diagnosis not present

## 2017-05-06 DIAGNOSIS — M545 Low back pain: Secondary | ICD-10-CM | POA: Diagnosis not present

## 2017-05-06 DIAGNOSIS — M62830 Muscle spasm of back: Secondary | ICD-10-CM

## 2017-05-06 DIAGNOSIS — R29898 Other symptoms and signs involving the musculoskeletal system: Secondary | ICD-10-CM

## 2017-05-06 NOTE — Patient Instructions (Signed)
   Sidelying Thoracic Rotation  Lying on side, hips and knees at 90 degrees. Rotate hand and head backwards until stretch is felt in mid back area. x15 reps each      LUMBAR ROLL  Use a small lumbar roll in chairs you sit at frequently. Place the roll at the lower curve of your back. This will help you maintain better posture.    [facility] [address], [place], [place] [address] Phone # [phone] Fax [phone]

## 2017-05-06 NOTE — Therapy (Signed)
[facility] [address] [place], [place], [address] Phone: [phone]   Fax:  [phone]  Physical Therapy Evaluation  Patient Details  Name: Dylan [name] MRN: [id number] Date of Birth: [birth year] Referring Provider: Scarlette Calico, MD    Encounter Date: 05/06/2017  PT End of Session - 05/06/17 1035    Visit Number  1    Date for PT Re-Evaluation  06/17/17    Authorization Type  [employer]     Authorization Time Period  05/06/17 to 06/17/17    PT Start Time  0931    PT Stop Time  1015    PT Time Calculation (min)  44 min    Activity Tolerance  Patient tolerated treatment well;No increased pain    Behavior During Therapy  WFL for tasks assessed/performed       Past Medical History:  Diagnosis Date  . Allergy    allergic rhinitis  . Anxiety    xanax as needed   . Arthritis    R knee, hands , back   . Cancer ([place])    melonoma- on face, treated with excision   . Complication of anesthesia    trouble waking up with succycholine, does not have enzyme to break down succycholine  . Diabetes mellitus   . Dizziness    MRI brain unremarkable 02/05/2015.  Marland Kitchen GERD (gastroesophageal reflux disease)   . Headache(784.0)   . Hepatitis age 71   history of infections Hepatitis type a  . History of melanoma 15 yrs ago   face and forehead-- previously followed by Derm in [place]  . History of ulcerative colitis    non recent flares  . Hyperlipidemia   . Hypertension   . Sleep apnea    last sleep study 10 yrs ago,CPAP- intermittent use, not every night      Past Surgical History:  Procedure Laterality Date  . acl and mensicus repair reconstruct Right 1980's  . ANTERIOR CERVICAL DECOMP/DISCECTOMY FUSION  10/15/2011   Procedure: ANTERIOR CERVICAL DECOMPRESSION/DISCECTOMY FUSION 1 LEVEL;  Surgeon: [name], MD;  Location: [facility];  Service: Neurosurgery;  Laterality: N/A;  Cervical four-five Anterior  cervical decompression/diskectomy/fusion  . APPENDECTOMY  1969  . COLONOSCOPY    . HERNIA REPAIR     inguinal / abdominal - repaired as an infant   . JOINT REPLACEMENT    . KNEE ARTHROSCOPY Left last done oct 2013   x 3  . KNEE SURGERY  yrs ago   right knee  . LUMBAR LAMINECTOMY WITH COFLEX 2 LEVEL N/A 09/12/2015   Procedure: L3-4 L4-5 Laminectomy with coflex;  Surgeon: [name], MD;  Location: [facility];  Service: Neurosurgery;  Laterality: N/A;  L3-4 L4-5 Laminectomy with coflex  . NASAL SINUS SURGERY     multiple sinus surgery - 1980's   . TONSILLECTOMY  1969  . TOTAL KNEE ARTHROPLASTY Left 10/22/2012   Procedure: TOTAL KNEE ARTHROPLASTY;  Surgeon: [name], MD;  Location: [facility];  Service: Orthopedics;  Laterality: Left;    There were no vitals filed for this visit.   Subjective Assessment - 05/06/17 0944    Subjective  Pt reports having back pain that began about 3 weeks ago out of nowhere. He says the pain started just above where he has had pain previously and traveled up towards the shoulder blades. Since then, his pain has not eased up. He is trying to use icy/hot and his back brace to see  if it could help with more support. He typically plays golf 3x/week but the other day he was only able to complete 6 holes. He does note some numbness in the feet and legs which was there prior to his back starting to hurt.     Pertinent History  Cervical ACDF, prior lumbar (L3/L4, L4/L5) surgery, headaches, anxiety, Lt TKA    Limitations  Sitting;House hold activities    How long can you sit comfortably?  10 min    Diagnostic tests  not recently    Patient Stated Goals  decrease pain    Currently in Pain?  Yes    Pain Score  6     Pain Location  Back    Pain Orientation  Right;Mid    Pain Descriptors / Indicators  Aching    Pain Type  Other (Comment) acute on chronic    Pain Radiating Towards  none     Pain Onset  1 to 4 weeks ago    Pain Frequency  Intermittent     Aggravating Factors   sitting for too long, alot of twisting    Pain Relieving Factors  icy/hot, massage, ice    Effect of Pain on Daily Activities  moderate limitation         [facility] PT Assessment - 05/06/17 0001      Assessment   Medical Diagnosis  Lumbar stenosis with neurogenic claudication    Referring Provider  Scarlette Calico, MD     Onset Date/Surgical Date  -- acute onset 3 weeks ago    Prior Therapy  none for this       Precautions   Precautions  None      Balance Screen   Has the patient fallen in the past 6 months  No    Has the patient had a decrease in activity level because of a fear of falling?   No    Is the patient reluctant to leave their home because of a fear of falling?   No      Prior Function   Level of Independence  Independent    Vocation Requirements  retired, but helps take his wife to several doctors appointments       Cognition   Overall Cognitive Status  Within Functional Limits for tasks assessed      Observation/Other Assessments   Focus on Therapeutic Outcomes (FOTO)   60% limitation      Sensation   Additional Comments  Pt reports LE numbness resulting from prior lumbar surgery       Posture/Postural Control   Posture Comments  forward head, rounded shoulders, decreased lumbar lordosis       ROM / Strength   AROM / PROM / Strength  AROM;Strength      AROM   Overall AROM Comments  thoracic ROM: Rotation Rt 30 deg, Lt 20 deg    AROM Assessment Site  Lumbar    Lumbar Flexion  75% limited, pulling stretch    Lumbar Extension  75% limited, Rt rotation noted, pain end range    Lumbar - Right Side Bend  no pain    Lumbar - Left Side Bend  pain Rt side    Lumbar - Right Rotation  25% limited, minimal pain    Lumbar - Left Rotation  50% limited, pain Rt side      Strength   Strength Assessment Site  Hip;Knee;Ankle    Right/Left Hip  Right;Left    Right Hip Flexion  5/5    Right Hip Extension  5/5    Right Hip ABduction  4/5    Left Hip  Flexion  5/5    Left Hip Extension  5/5    Left Hip ABduction  4/5    Right/Left Knee  Right;Left    Right Knee Flexion  5/5    Right Knee Extension  5/5    Left Knee Flexion  5/5    Left Knee Extension  5/5    Right/Left Ankle  Right;Left    Right Ankle Dorsiflexion  5/5    Left Ankle Dorsiflexion  5/5      Flexibility   Soft Tissue Assessment /Muscle Length  yes    Hamstrings  90/90 testing, Lt lacking 60 deg, Rt lacking 50 deg     Quadriceps  90 deg bilaterally       Palpation   Spinal mobility  hypomobile and tender thoracic spine     Palpation comment  muscle spasm thoracic and lumbar multifidi, latissimus, lumbothoracic paraspinals       Special Tests   Other special tests  passive straight leg raise (-) Lt and Rt       Transfers   Five time sit to stand comments   16 sec, pain with standing              Objective measurements completed on examination: See above findings.      [facility] Adult PT Treatment/Exercise - 05/06/17 0001      Self-Care   Self-Care  Posture    Posture  set up and use of lumbar roll       Exercises   Exercises  Lumbar      Lumbar Exercises: Sidelying   Other Sidelying Lumbar Exercises  thoracic rotation x5 reps Lt and Rt with LE on bolster      Manual Therapy   Manual Therapy  Soft tissue mobilization    Soft tissue mobilization  soft tissue mobilization along Rt thoracic and lumbar paraspinals              PT Education - 05/06/17 1033    Education provided  Yes    Education Details  eval findings/POC; use of towel roll for posture awareness; implemented HEP and reviewed technique; benefits of improving hip/spine mobility for carry over with golf; discussed continued work on golf swing at 25-50% effort only; use of back support brace only with heavy activity to prevent dependence    Person(s) Educated  Patient    Methods  Explanation;Handout;Verbal cues    Comprehension  Verbalized understanding;Returned demonstration        PT Short Term Goals - 05/06/17 1044      PT SHORT TERM GOAL #1   Title  Pt will demo consistency and independence with his HEP to improve posture and decrease pain with activity.    Time  2    Period  Weeks    Status  New    Target Date  05/20/17      PT SHORT TERM GOAL #2   Title  Pt will report atleast 30% improvement in his pain from the start of therapy, to improve his activity completion throughout the day.     Time  3    Period  Weeks    Status  New    Target Date  05/20/17      PT SHORT TERM GOAL #3   Title  Pt will demo proper set up and use of the  lumbar roll for additional low back support, to allow him to sit atleast 20 minutes without significant pain during his wife's doctor's appointments.     Time  3    Period  Weeks    Status  New        PT Long Term Goals - 05/06/17 1046      PT LONG TERM GOAL #1   Title  Pt will demo improved hip strength to atleast 5/5 MMT which will increase his stability with golfing activity.     Time  6    Period  Weeks    Status  New    Target Date  06/17/17      PT LONG TERM GOAL #2   Title  Pt will demo improved thoracic rotation to atleast 45 deg each direction which will allow him to complete his golf swing without increase in back pain.     Time  6    Period  Weeks    Status  New      PT LONG TERM GOAL #3   Title  Pt will demo improved hamstring flexibility to lacking no more than 30 deg bilaterally, which will improve his sitting and standing alignment throughout the day.     Time  6    Period  Weeks    Status  New      PT LONG TERM GOAL #4   Title  Pt will report improvements in pain, strength and mobility evident by his ability to complete 18 holes of golf without stopping.     Time  6    Period  Weeks    Status  New      PT LONG TERM GOAL #5   Title  Pt will report atleast 60% improvement in his back pain from the start of therapy, to improve his quality of life and decrease the need for surgical intervention  in the future.     Time  6    Period  Weeks    Status  New             Plan - 05/06/17 1037    Clinical Impression Statement  Pt is a pleasant 67 y.o M referred to OPPT with complaints of acute on chronic mid to low back pain, onset insidiously 3 weeks ago. Pt demonstrates poor trunk strength and hip abductor weakness primarily, in addition to significantly restricted thoracic and lumbar spine mobility and ROM. There is palpable increase in muscle spasm throughout the upper lumbar and lower thoracic spine. Pt typically plays golf 3 days a week and has been unable to complete more than 6 holes recently. He is also frequently accompanying his wife to her doctor's appointments, and his pain is limiting his ability to remain sitting for more than 10 minutes at a time. He would benefit from skilled PT to address his limitations in flexibility, strength and improve his stability with trunk rotation to allow his return to his daily and leisure activity.     History and Personal Factors relevant to plan of care:  history of lumbar pain with L3-L4 laminectomy; Cervical ACDF    Clinical Presentation  Stable    Clinical Presentation due to:  not changed since recent onset     Clinical Decision Making  Low    Rehab Potential  Good    PT Frequency  2x / week    PT Duration  6 weeks    PT Treatment/Interventions  ADLs/Self Care Home Management;Cryotherapy;Dealer  Stimulation;Moist Heat;Traction;Functional mobility training;Therapeutic activities;Therapeutic exercise;Patient/family education;Neuromuscular re-education;Manual techniques;Taping;Dry needling;Passive range of motion    PT Next Visit Plan  continue to address lumbar/thoracic ROM/flexibility, f/u on use of towel roll, manual techniques to decrease muscle spasm, introduce trunk stability/strengthening     PT Home Exercise Plan  lumbar towel roll, sidelying thoracic rotation    Consulted and Agree with Plan of Care  Patient       Patient  will benefit from skilled therapeutic intervention in order to improve the following deficits and impairments:  Decreased activity tolerance, Impaired flexibility, Hypomobility, Decreased strength, Decreased range of motion, Decreased endurance, Decreased mobility, Increased muscle spasms, Postural dysfunction, Pain, Improper body mechanics  Visit Diagnosis: Right low back pain, unspecified chronicity, with sciatica presence unspecified  Muscle spasm of back  Other symptoms and signs involving the musculoskeletal system  Abnormal posture     Problem List Patient Active Problem List   Diagnosis Date Noted  . 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 Routine general medical examination at a health care facility 11/11/2016  . Oropharyngeal dysphagia 03/05/2016  . Lumbar stenosis with neurogenic claudication 09/12/2015  . Hypertension 05/10/2011  . BPH (benign prostatic hyperplasia) 08/18/2010  . OA (osteoarthritis) 11/15/2009  . CARPAL TUNNEL SYNDROME 08/07/2009  . SLEEP APNEA, OBSTRUCTIVE, MODERATE 03/22/2009  . Insomnia w/ sleep apnea 03/22/2009  . Diabetes mellitus without complication ([place]) 57/32/2025  . MELANOMA 11/17/2008  . Hyperlipidemia LDL goal <100 11/17/2008  . ALLERGIC RHINITIS 11/17/2008    10:53 AM,05/06/17 Sherol Dade PT, DPT [facility]  [facility] [address] [place], [place], [address] Phone: [phone]   Fax:  [phone]  Name: Dylan [name] MRN: [id number] Date of Birth: June 15, 1950

## 2017-05-07 NOTE — Telephone Encounter (Signed)
PA has been approved, can you contact pt and inform of same.

## 2017-05-08 ENCOUNTER — Ambulatory Visit: Payer: Medicare HMO

## 2017-05-08 DIAGNOSIS — R29898 Other symptoms and signs involving the musculoskeletal system: Secondary | ICD-10-CM | POA: Diagnosis not present

## 2017-05-08 DIAGNOSIS — R293 Abnormal posture: Secondary | ICD-10-CM

## 2017-05-08 DIAGNOSIS — M62830 Muscle spasm of back: Secondary | ICD-10-CM

## 2017-05-08 DIAGNOSIS — M545 Low back pain: Secondary | ICD-10-CM | POA: Diagnosis not present

## 2017-05-08 NOTE — Patient Instructions (Addendum)
Piriformis Stretch, Sitting    Sit, one ankle on opposite knee, same-side hand on crossed knee. Push down on knee, keeping spine straight. Lean torso forward, with flat back, until tension is felt in hamstrings and gluteals of crossed-leg side. Hold _20__ seconds.  Repeat _3__ times per session. Do _3__ sessions per day.  Copyright  VHI. All rights reserved.  HIP: Hamstrings - Short Sitting    Rest leg on raised surface. Keep knee straight. Lift chest. Hold _20__ seconds. _3__ reps per set, __3_ sets per day, ___ days per week  Copyright  VHI. All rights reserved.  Cervico-Thoracic: Extension / Rotation (Sitting)    Reach across body with left arm and grasp back of chair. Gently look over right side shoulder. Hold __20__ seconds. Relax. Repeat __3__ times per set. Do _2-3___ sets per session. Do ____ sessions per day.  http://orth.exer.us/980   Hip Flexor Stretch    Lying on back near edge of bed, bend one leg, foot flat. Hang other leg over edge, relaxed, thigh resting entirely on bed for ____ minutes. Repeat ____ times. Do ____ sessions per day. Advanced Exercise: Bend knee back keeping thigh in contact with bed.  Abduction: Clam (Eccentric) - Side-Lying    Lie on side with knees bent. Lift top knee, keeping feet together. Keep trunk steady. Slowly lower for 3-5 seconds. _10__ reps per set, _2__ sets per day   [facility] [address] [place], [place] [address] Phone # [phone] Fax [phone]

## 2017-05-08 NOTE — Therapy (Signed)
[facility] [address] [place], [place], [address] Phone: [phone]   Fax:  [phone]  Physical Therapy Treatment  Patient Details  Name: Dylan [name] MRN: [id number] Date of Birth: 1950-08-20 Referring Provider: Scarlette Calico, MD    Encounter Date: 05/08/2017  PT End of Session - 05/08/17 1013    Visit Number  2    Date for PT Re-Evaluation  06/17/17    Authorization Type  [employer]     Authorization Time Period  05/06/17 to 06/17/17    PT Start Time  0931    PT Stop Time  1013    PT Time Calculation (min)  42 min    Activity Tolerance  Patient tolerated treatment well    Behavior During Therapy  [facility] for tasks assessed/performed       Past Medical History:  Diagnosis Date  . Allergy    allergic rhinitis  . Anxiety    xanax as needed   . Arthritis    R knee, hands , back   . Cancer ([place])    melonoma- on face, treated with excision   . Complication of anesthesia    trouble waking up with succycholine, does not have enzyme to break down succycholine  . Diabetes mellitus   . Dizziness    MRI brain unremarkable 02/05/2015.  Marland Kitchen GERD (gastroesophageal reflux disease)   . Headache(784.0)   . Hepatitis age 83   history of infections Hepatitis type a  . History of melanoma 15 yrs ago   face and forehead-- previously followed by Derm in [place]  . History of ulcerative colitis    non recent flares  . Hyperlipidemia   . Hypertension   . Sleep apnea    last sleep study 10 yrs ago,CPAP- intermittent use, not every night      Past Surgical History:  Procedure Laterality Date  . acl and mensicus repair reconstruct Right 1980's  . ANTERIOR CERVICAL DECOMP/DISCECTOMY FUSION  10/15/2011   Procedure: ANTERIOR CERVICAL DECOMPRESSION/DISCECTOMY FUSION 1 LEVEL;  Surgeon: [name], MD;  Location: [facility];  Service: Neurosurgery;  Laterality: N/A;  Cervical four-five Anterior cervical  decompression/diskectomy/fusion  . APPENDECTOMY  1969  . COLONOSCOPY    . HERNIA REPAIR     inguinal / abdominal - repaired as an infant   . JOINT REPLACEMENT    . KNEE ARTHROSCOPY Left last done oct 2013   x 3  . KNEE SURGERY  yrs ago   right knee  . LUMBAR LAMINECTOMY WITH COFLEX 2 LEVEL N/A 09/12/2015   Procedure: L3-4 L4-5 Laminectomy with coflex;  Surgeon: [name], MD;  Location: [facility];  Service: Neurosurgery;  Laterality: N/A;  L3-4 L4-5 Laminectomy with coflex  . NASAL SINUS SURGERY     multiple sinus surgery - 1980's   . TONSILLECTOMY  1969  . TOTAL KNEE ARTHROPLASTY Left 10/22/2012   Procedure: TOTAL KNEE ARTHROPLASTY;  Surgeon: [name], MD;  [facility];  Service: Orthopedics;  Laterality: Left;    There were no vitals filed for this visit.  Subjective Assessment - 05/08/17 0931    Subjective  PT reviewed exercises that were a part of his program prior to his increased pain.  Pt with weak core, stiffness in the thoracic/lumbar spine and hip weakness.  PT provided tactile and verbal cues for all exercise today for modifications and correct technique .  Pt will continue to benefit from skilled PT for core strength,  endurance, manual and modalities.      Pertinent History  Cervical ACDF, prior lumbar (L3/L4, L4/L5) surgery, headaches, anxiety, Lt TKA    Currently in Pain?  Yes    Pain Score  4     Pain Location  Back    Pain Orientation  Right;Mid    Pain Descriptors / Indicators  Aching                      [facility] Adult PT Treatment/Exercise - 05/08/17 0001      Lumbar Exercises: Stretches   Active Hamstring Stretch  3 reps;20 seconds;Left;Right    Hip Flexor Stretch  Left;Right;3 reps;20 seconds    Figure 4 Stretch  3 reps;20 seconds      Lumbar Exercises: Aerobic   Nustep  Level 2 x 8 minutes PT present to discuss progress and HEP with pt      Lumbar Exercises: Standing   Row  Strengthening;Both;20 reps;Theraband    Theraband  Level (Row)  Level 4 (Blue)      Lumbar Exercises: Seated   Other Seated Lumbar Exercises  thoracolumbar stretch: 3x20 seconds      Lumbar Exercises: Sidelying   Clam  Both;20 reps    Other Sidelying Lumbar Exercises  thoracic rotation x5 reps Lt and Rt with LE on bolster             PT Education - 05/08/17 0949    Education provided  Yes    Education Details  piriformis, hamstring and lumbar rotation stretch in sitting    Person(s) Educated  Patient    Methods  Demonstration;Handout;Explanation    Comprehension  Verbalized understanding;Returned demonstration       PT Short Term Goals - 05/06/17 1044      PT SHORT TERM GOAL #1   Title  Pt will demo consistency and independence with his HEP to improve posture and decrease pain with activity.    Time  2    Period  Weeks    Status  New    Target Date  05/20/17      PT SHORT TERM GOAL #2   Title  Pt will report atleast 30% improvement in his pain from the start of therapy, to improve his activity completion throughout the day.     Time  3    Period  Weeks    Status  New    Target Date  05/20/17      PT SHORT TERM GOAL #3   Title  Pt will demo proper set up and use of the lumbar roll for additional low back support, to allow him to sit atleast 20 minutes without significant pain during his wife's doctor's appointments.     Time  3    Period  Weeks    Status  New        PT Long Term Goals - 05/06/17 1046      PT LONG TERM GOAL #1   Title  Pt will demo improved hip strength to atleast 5/5 MMT which will increase his stability with golfing activity.     Time  6    Period  Weeks    Status  New    Target Date  06/17/17      PT LONG TERM GOAL #2   Title  Pt will demo improved thoracic rotation to atleast 45 deg each direction which will allow him to complete his golf swing without increase in back pain.  Time  6    Period  Weeks    Status  New      PT LONG TERM GOAL #3   Title  Pt will demo improved  hamstring flexibility to lacking no more than 30 deg bilaterally, which will improve his sitting and standing alignment throughout the day.     Time  6    Period  Weeks    Status  New      PT LONG TERM GOAL #4   Title  Pt will report improvements in pain, strength and mobility evident by his ability to complete 18 holes of golf without stopping.     Time  6    Period  Weeks    Status  New      PT LONG TERM GOAL #5   Title  Pt will report atleast 60% improvement in his back pain from the start of therapy, to improve his quality of life and decrease the need for surgical intervention in the future.     Time  6    Period  Weeks    Status  New              Patient will benefit from skilled therapeutic intervention in order to improve the following deficits and impairments:     Visit Diagnosis: Right low back pain, unspecified chronicity, with sciatica presence unspecified  Muscle spasm of back  Other symptoms and signs involving the musculoskeletal system  Abnormal posture     Problem List Patient Active Problem List   Diagnosis Date Noted  . 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 Routine general medical examination at a health care facility 11/11/2016  . Oropharyngeal dysphagia 03/05/2016  . Lumbar stenosis with neurogenic claudication 09/12/2015  . Hypertension 05/10/2011  . BPH (benign prostatic hyperplasia) 08/18/2010  . OA (osteoarthritis) 11/15/2009  . CARPAL TUNNEL SYNDROME 08/07/2009  . SLEEP APNEA, OBSTRUCTIVE, MODERATE 03/22/2009  . Insomnia w/ sleep apnea 03/22/2009  . Diabetes mellitus without complication ([place]) 47/82/9562  . MELANOMA 11/17/2008  . Hyperlipidemia LDL goal <100 11/17/2008  . ALLERGIC RHINITIS 11/17/2008     Sigurd Sos, PT 05/08/17 10:15 AM  [facility] [address], [place], [place], [address] Phone: [phone]   Fax:  [phone]  Name:  AVERIE MEINER MRN: [id number] Date of Birth: February 24, 1951

## 2017-05-09 NOTE — Telephone Encounter (Signed)
LVM leting patient know

## 2017-05-11 ENCOUNTER — Other Ambulatory Visit: Payer: Self-pay | Admitting: Internal Medicine

## 2017-05-11 DIAGNOSIS — E785 Hyperlipidemia, unspecified: Secondary | ICD-10-CM

## 2017-05-13 ENCOUNTER — Encounter: Payer: Self-pay | Admitting: Internal Medicine

## 2017-05-13 ENCOUNTER — Other Ambulatory Visit: Payer: Self-pay | Admitting: Internal Medicine

## 2017-05-13 DIAGNOSIS — M159 Polyosteoarthritis, unspecified: Secondary | ICD-10-CM

## 2017-05-13 DIAGNOSIS — M48062 Spinal stenosis, lumbar region with neurogenic claudication: Secondary | ICD-10-CM

## 2017-05-13 DIAGNOSIS — M150 Primary generalized (osteo)arthritis: Principal | ICD-10-CM

## 2017-05-13 DIAGNOSIS — G5601 Carpal tunnel syndrome, right upper limb: Secondary | ICD-10-CM

## 2017-05-13 DIAGNOSIS — M5412 Radiculopathy, cervical region: Secondary | ICD-10-CM

## 2017-05-13 DIAGNOSIS — M4692 Unspecified inflammatory spondylopathy, cervical region: Secondary | ICD-10-CM

## 2017-05-13 MED ORDER — HYDROCODONE-ACETAMINOPHEN 10-325 MG PO TABS: 1.0000 | ORAL_TABLET | Freq: Four times a day (QID) | ORAL | 0 refills | Status: DC | PRN

## 2017-05-13 NOTE — Telephone Encounter (Signed)
Check [place] registry last filled 04/22/2017..lmb  

## 2017-05-14 ENCOUNTER — Encounter: Payer: Self-pay | Admitting: Physical Therapy

## 2017-05-14 ENCOUNTER — Ambulatory Visit: Payer: Medicare HMO | Admitting: Physical Therapy

## 2017-05-14 DIAGNOSIS — M62830 Muscle spasm of back: Secondary | ICD-10-CM | POA: Diagnosis not present

## 2017-05-14 DIAGNOSIS — M545 Low back pain: Secondary | ICD-10-CM | POA: Diagnosis not present

## 2017-05-14 DIAGNOSIS — R293 Abnormal posture: Secondary | ICD-10-CM

## 2017-05-14 DIAGNOSIS — R29898 Other symptoms and signs involving the musculoskeletal system: Secondary | ICD-10-CM | POA: Diagnosis not present

## 2017-05-14 NOTE — Therapy (Signed)
[facility] [address], [place], [place], [address] Phone: [phone]   Fax:  [phone]  Physical Therapy Treatment  Patient Details  Name: Dylan [name] MRN: [id number] Date of Birth: 1950/05/16 Referring Provider: Dr. Scarlette Calico   Encounter Date: 05/14/2017  PT End of Session - 05/14/17 0927    Visit Number  3    Date for PT Re-Evaluation  06/17/17    Authorization Type  [employer]     Authorization Time Period  05/06/17 to 06/17/17    PT Start Time  0845    PT Stop Time  0925    PT Time Calculation (min)  40 min    Activity Tolerance  Patient tolerated treatment well    Behavior During Therapy  [facility] for tasks assessed/performed       Past Medical History:  Diagnosis Date  . Allergy    allergic rhinitis  . Anxiety    xanax as needed   . Arthritis    R knee, hands , back   . Cancer ([place])    melonoma- on face, treated with excision   . Complication of anesthesia    trouble waking up with succycholine, does not have enzyme to break down succycholine  . Diabetes mellitus   . Dizziness    MRI brain unremarkable 02/05/2015.  Marland Kitchen GERD (gastroesophageal reflux disease)   . Headache(784.0)   . Hepatitis age 22   history of infections Hepatitis type a  . History of melanoma 15 yrs ago   face and forehead-- previously followed by Derm in [place]  . History of ulcerative colitis    non recent flares  . Hyperlipidemia   . Hypertension   . Sleep apnea    last sleep study 10 yrs ago,CPAP- intermittent use, not every night      Past Surgical History:  Procedure Laterality Date  . acl and mensicus repair reconstruct Right 1980's  . ANTERIOR CERVICAL DECOMP/DISCECTOMY FUSION  10/15/2011   Procedure: ANTERIOR CERVICAL DECOMPRESSION/DISCECTOMY FUSION 1 LEVEL;  Surgeon: [name], MD;  Location: [facility];  Service: Neurosurgery;  Laterality: N/A;  Cervical four-five Anterior cervical  decompression/diskectomy/fusion  . APPENDECTOMY  1969  . COLONOSCOPY    . HERNIA REPAIR     inguinal / abdominal - repaired as an infant   . JOINT REPLACEMENT    . KNEE ARTHROSCOPY Left last done oct 2013   x 3  . KNEE SURGERY  yrs ago   right knee  . LUMBAR LAMINECTOMY WITH COFLEX 2 LEVEL N/A 09/12/2015   Procedure: L3-4 L4-5 Laminectomy with coflex;  Surgeon: [name], MD;  Location: [facility];  Service: Neurosurgery;  Laterality: N/A;  L3-4 L4-5 Laminectomy with coflex  . NASAL SINUS SURGERY     multiple sinus surgery - 1980's   . TONSILLECTOMY  1969  . TOTAL KNEE ARTHROPLASTY Left 10/22/2012   Procedure: TOTAL KNEE ARTHROPLASTY;  Surgeon: [name], MD;  Location: [facility];  Service: Orthopedics;  Laterality: Left;    There were no vitals filed for this visit.  Subjective Assessment - 05/14/17 0849    Subjective  Patient reports he was cutting the tree down and a limb hit lateral left lower leg.  The next moring the patient missed the step and went down 4 steps and landed on the back and left hip.  The back is feeling better and I want to be discharged so I can have my left leg and  hip evaluated by the doctor.     Pertinent History  Cervical ACDF, prior lumbar (L3/L4, L4/L5) surgery, headaches, anxiety, Lt TKA    Limitations  Sitting;House hold activities    How long can you sit comfortably?  10 min    Diagnostic tests  not recently    Patient Stated Goals  decrease pain    Currently in Pain?  Yes    Pain Score  3     Pain Location  Back    Pain Orientation  Right;Mid    Pain Descriptors / Indicators  Aching    Pain Type  Other (Comment) acute on chronic    Pain Onset  1 to 4 weeks ago    Pain Frequency  Intermittent    Aggravating Factors   sitting too long, alot of twisting    Pain Relieving Factors  icy/hot,massage , ice         [facility] PT Assessment - 05/14/17 0001      Assessment   Medical Diagnosis  Lumbar stenosis with neurogenic claudication     Referring Provider  Dr. Scarlette Calico    Onset Date/Surgical Date  -- acute onset 3 weeks ago    Prior Therapy  none for this       Precautions   Precautions  None      Restrictions   Weight Bearing Restrictions  No      Balance Screen   Has the patient fallen in the past 6 months  Yes    How many times?  1 Saturday down 4 steps    Has the patient had a decrease in activity level because of a fear of falling?   No    Is the patient reluctant to leave their home because of a fear of falling?   No      Home Film/video editor residence      Prior Function   Level of Independence  Independent    Vocation Requirements  retired, but helps take his wife to several doctors appointments       Cognition   Overall Cognitive Status  Within Functional Limits for tasks assessed      Observation/Other Assessments   Focus on Therapeutic Outcomes (FOTO)   42% limitation      Palpation   SI assessment   Sacrum is rotated left    Palpation comment  tenderness located in the Left piriformis, left SI joint, left lumbar sacral area. left lumbar sacral area                  [facility] Adult PT Treatment/Exercise - 05/14/17 0001      Lumbar Exercises: Stretches   Active Hamstring Stretch  3 reps;20 seconds;Left;Right    Hip Flexor Stretch  Left;Right;3 reps;20 seconds    Figure 4 Stretch  3 reps;20 seconds    Other Lumbar Stretch Exercise  left hip adductor stretch manually      Lumbar Exercises: Sidelying   Clam  Left;10 reps verbal cues to abdominal brace andbreath      Manual Therapy   Manual Therapy  Soft tissue mobilization;Joint mobilization    Joint Mobilization  correct sacral mobilization    Soft tissue mobilization  left piriformis, left SI joint, left lumbar sacral area               PT Short Term Goals - 05/14/17 0932      PT SHORT TERM GOAL #1   Title  Pt will demo consistency and independence with his HEP to improve posture and  decrease pain with activity.    Time  2    Period  Weeks    Status  Achieved      PT SHORT TERM GOAL #2   Title  Pt will report atleast 30% improvement in his pain from the start of therapy, to improve his activity completion throughout the day.     Time  3    Period  Weeks    Status  On-going      PT SHORT TERM GOAL #3   Title  Pt will demo proper set up and use of the lumbar roll for additional low back support, to allow him to sit atleast 20 minutes without significant pain during his wife's doctor's appointments.     Time  3    Period  Weeks    Status  On-going        PT Long Term Goals - 05/06/17 1046      PT LONG TERM GOAL #1   Title  Pt will demo improved hip strength to atleast 5/5 MMT which will increase his stability with golfing activity.     Time  6    Period  Weeks    Status  New    Target Date  06/17/17      PT LONG TERM GOAL #2   Title  Pt will demo improved thoracic rotation to atleast 45 deg each direction which will allow him to complete his golf swing without increase in back pain.     Time  6    Period  Weeks    Status  New      PT LONG TERM GOAL #3   Title  Pt will demo improved hamstring flexibility to lacking no more than 30 deg bilaterally, which will improve his sitting and standing alignment throughout the day.     Time  6    Period  Weeks    Status  New      PT LONG TERM GOAL #4   Title  Pt will report improvements in pain, strength and mobility evident by his ability to complete 18 holes of golf without stopping.     Time  6    Period  Weeks    Status  New      PT LONG TERM GOAL #5   Title  Pt will report atleast 60% improvement in his back pain from the start of therapy, to improve his quality of life and decrease the need for surgical intervention in the future.     Time  6    Period  Weeks    Status  New            Plan - 05/14/17 3220    Clinical Impression Statement  Patient was cutting down a tree and limb hit him in the  lateral left lower leg then on Saturday he feel down the steps onto his left hip.  Patient pain has increased in the low back, left hip and leg to 3/10. Patient is ambulaitng with a limp on the left and decreased left hip ROM. Sacrum is rotated left.  Patient feels his upper back is 70% better. We will continue physical therapy due to the lower back pain and left hip.  Patient will be going to the doctor for his left hip and lower leg to be evaluated.  Patient will benefit from skilled therapy to reduce lower back  pain and left hip pain to improve daily activities.     Rehab Potential  Good    PT Frequency  2x / week    PT Duration  6 weeks    PT Treatment/Interventions  ADLs/Self Care Home Management;Cryotherapy;Electrical Stimulation;Moist Heat;Traction;Functional mobility training;Therapeutic activities;Therapeutic exercise;Patient/family education;Neuromuscular re-education;Manual techniques;Taping;Dry needling;Passive range of motion    PT Next Visit Plan  continue to address lumbar/thoracic ROM/flexibility, f/u on use of towel roll, manual techniques to decrease muscle spasm, introduce trunk stability/strengthening     PT Home Exercise Plan  lumbar towel roll, sidelying thoracic rotation    Recommended Other Services  MD signed the intial eval    Consulted and Agree with Plan of Care  Patient       Patient will benefit from skilled therapeutic intervention in order to improve the following deficits and impairments:  Decreased activity tolerance, Impaired flexibility, Hypomobility, Decreased strength, Decreased range of motion, Decreased endurance, Decreased mobility, Increased muscle spasms, Postural dysfunction, Pain, Improper body mechanics  Visit Diagnosis: Right low back pain, unspecified chronicity, with sciatica presence unspecified  Muscle spasm of back  Other symptoms and signs involving the musculoskeletal system  Abnormal posture     Problem List Patient Active Problem List    Diagnosis Date Noted  . 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 Routine general medical examination at a health care facility 11/11/2016  . Oropharyngeal dysphagia 03/05/2016  . Lumbar stenosis with neurogenic claudication 09/12/2015  . Hypertension 05/10/2011  . BPH (benign prostatic hyperplasia) 08/18/2010  . OA (osteoarthritis) 11/15/2009  . CARPAL TUNNEL SYNDROME 08/07/2009  . SLEEP APNEA, OBSTRUCTIVE, MODERATE 03/22/2009  . Insomnia w/ sleep apnea 03/22/2009  . Diabetes mellitus without complication ([place]) 37/16/9678  . MELANOMA 11/17/2008  . Hyperlipidemia LDL goal <100 11/17/2008  . ALLERGIC RHINITIS 11/17/2008    Earlie Counts, PT 05/14/17 9:33 AM   [facility] [address] [place], [place], [address] Phone: [phone]   Fax:  [phone]  Name: Dylan [name] MRN: [id number] Date of Birth: Jan 21, 1951

## 2017-05-16 ENCOUNTER — Encounter: Payer: Medicare HMO | Admitting: Physical Therapy

## 2017-05-19 ENCOUNTER — Other Ambulatory Visit: Payer: Self-pay | Admitting: Internal Medicine

## 2017-05-21 ENCOUNTER — Encounter: Payer: Self-pay | Admitting: Physical Therapy

## 2017-05-21 ENCOUNTER — Ambulatory Visit: Payer: Medicare HMO | Admitting: Physical Therapy

## 2017-05-21 DIAGNOSIS — R293 Abnormal posture: Secondary | ICD-10-CM

## 2017-05-21 DIAGNOSIS — M545 Low back pain: Secondary | ICD-10-CM

## 2017-05-21 DIAGNOSIS — M62830 Muscle spasm of back: Secondary | ICD-10-CM | POA: Diagnosis not present

## 2017-05-21 DIAGNOSIS — R29898 Other symptoms and signs involving the musculoskeletal system: Secondary | ICD-10-CM | POA: Diagnosis not present

## 2017-05-21 NOTE — Therapy (Signed)
[facility] [address] [place], [place], [address] Phone: [phone]   Fax:  [phone]  Physical Therapy Treatment  Patient Details  Name: Dylan [name] MRN: [id number] Date of Birth: [birth year] Referring Provider: Dr. Scarlette Calico   Encounter Date: 05/21/2017  PT End of Session - 05/21/17 1009    Visit Number  4    Date for PT Re-Evaluation  06/17/17    Authorization Type  [employer]     Authorization Time Period  05/06/17 to 06/17/17    PT Start Time  0930    PT Stop Time  1012    PT Time Calculation (min)  42 min    Activity Tolerance  Patient tolerated treatment well    Behavior During Therapy  [facility] for tasks assessed/performed       Past Medical History:  Diagnosis Date  . Allergy    allergic rhinitis  . Anxiety    xanax as needed   . Arthritis    R knee, hands , back   . Cancer ([place])    melonoma- on face, treated with excision   . Complication of anesthesia    trouble waking up with succycholine, does not have enzyme to break down succycholine  . Diabetes mellitus   . Dizziness    MRI brain unremarkable 02/05/2015.  Marland Kitchen GERD (gastroesophageal reflux disease)   . Headache(784.0)   . Hepatitis age 27   history of infections Hepatitis type a  . History of melanoma 15 yrs ago   face and forehead-- previously followed by Derm in [place]  . History of ulcerative colitis    non recent flares  . Hyperlipidemia   . Hypertension   . Sleep apnea    last sleep study 10 yrs ago,CPAP- intermittent use, not every night      Past Surgical History:  Procedure Laterality Date  . acl and mensicus repair reconstruct Right 1980's  . ANTERIOR CERVICAL DECOMP/DISCECTOMY FUSION  10/15/2011   Procedure: ANTERIOR CERVICAL DECOMPRESSION/DISCECTOMY FUSION 1 LEVEL;  Surgeon: [name], MD;  Location: [facility];  Service: Neurosurgery;  Laterality: N/A;  Cervical four-five Anterior cervical  decompression/diskectomy/fusion  . APPENDECTOMY  1969  . COLONOSCOPY    . HERNIA REPAIR     inguinal / abdominal - repaired as an infant   . JOINT REPLACEMENT    . KNEE ARTHROSCOPY Left last done oct 2013   x 3  . KNEE SURGERY  yrs ago   right knee  . LUMBAR LAMINECTOMY WITH COFLEX 2 LEVEL N/A 09/12/2015   Procedure: L3-4 L4-5 Laminectomy with coflex;  Surgeon: [name], MD;  Location: [facility];  Service: Neurosurgery;  Laterality: N/A;  L3-4 L4-5 Laminectomy with coflex  . NASAL SINUS SURGERY     multiple sinus surgery - 1980's   . TONSILLECTOMY  1969  . TOTAL KNEE ARTHROPLASTY Left 10/22/2012   Procedure: TOTAL KNEE ARTHROPLASTY;  Surgeon: [name], MD;  Location: [facility];  Service: Orthopedics;  Laterality: Left;    There were no vitals filed for this visit.  Subjective Assessment - 05/21/17 0932    Subjective  Feels Lt knee has been "locking up more recently" and still a little sore on Lt knee and hip from fall, but improving.  upper back "feels real good." pain now in lower back/buttock on Lt side    Patient Stated Goals  decrease pain    Currently in Pain?  Yes    Pain  Score  4     Pain Location  Back    Pain Orientation  Left;Lower    Pain Descriptors / Indicators  Aching    Pain Onset  1 to 4 weeks ago    Pain Frequency  Intermittent    Aggravating Factors   sitting too long, a lot of twisting    Pain Relieving Factors  icy/hot, massage, ice                No data recorded       [facility] Adult PT Treatment/Exercise - 05/21/17 0934      Lumbar Exercises: Stretches   ITB Stretch  Left;3 reps;30 seconds    Piriformis Stretch  Right;Left;3 reps;30 seconds KTOS      Lumbar Exercises: Aerobic   Nustep  Level 4 x 8 minutes      Lumbar Exercises: Standing   Heel Raises  20 reps    Other Standing Lumbar Exercises  hip abdct/ext x 20 reps bil      Lumbar Exercises: Supine   Bridge  20 reps;5 seconds    Straight Leg Raise  20 reps      Lumbar  Exercises: Sidelying   Clam  Both;20 reps               PT Short Term Goals - 05/14/17 0932      PT SHORT TERM GOAL #1   Title  Pt will demo consistency and independence with his HEP to improve posture and decrease pain with activity.    Time  2    Period  Weeks    Status  Achieved      PT SHORT TERM GOAL #2   Title  Pt will report atleast 30% improvement in his pain from the start of therapy, to improve his activity completion throughout the day.     Time  3    Period  Weeks    Status  On-going      PT SHORT TERM GOAL #3   Title  Pt will demo proper set up and use of the lumbar roll for additional low back support, to allow him to sit atleast 20 minutes without significant pain during his wife's doctor's appointments.     Time  3    Period  Weeks    Status  On-going        PT Long Term Goals - 05/06/17 1046      PT LONG TERM GOAL #1   Title  Pt will demo improved hip strength to atleast 5/5 MMT which will increase his stability with golfing activity.     Time  6    Period  Weeks    Status  New    Target Date  06/17/17      PT LONG TERM GOAL #2   Title  Pt will demo improved thoracic rotation to atleast 45 deg each direction which will allow him to complete his golf swing without increase in back pain.     Time  6    Period  Weeks    Status  New      PT LONG TERM GOAL #3   Title  Pt will demo improved hamstring flexibility to lacking no more than 30 deg bilaterally, which will improve his sitting and standing alignment throughout the day.     Time  6    Period  Weeks    Status  New      PT LONG TERM GOAL #  4   Title  Pt will report improvements in pain, strength and mobility evident by his ability to complete 18 holes of golf without stopping.     Time  6    Period  Weeks    Status  New      PT LONG TERM GOAL #5   Title  Pt will report atleast 60% improvement in his back pain from the start of therapy, to improve his quality of life and decrease the need  for surgical intervention in the future.     Time  6    Period  Weeks    Status  New            Plan - 05/21/17 1009    Clinical Impression Statement  Pt with increased episodes of Lt knee locking since tree limb hit pt in leg.  Recommend he follow up with MD as it's limiting what exercises can be performed as well as a change in status of the knee.  Overall progressing well with decreased pain in low back.    Rehab Potential  Good    PT Frequency  2x / week    PT Duration  6 weeks    PT Treatment/Interventions  ADLs/Self Care Home Management;Cryotherapy;Electrical Stimulation;Moist Heat;Traction;Functional mobility training;Therapeutic activities;Therapeutic exercise;Patient/family education;Neuromuscular re-education;Manual techniques;Taping;Dry needling;Passive range of motion    PT Next Visit Plan  continue to address lumbar/thoracic ROM/flexibility, f/u on use of towel roll, manual techniques to decrease muscle spasm, introduce trunk stability/strengthening, monitor knee pain PRN    PT Home Exercise Plan  lumbar towel roll, sidelying thoracic rotation    Consulted and Agree with Plan of Care  Patient       Patient will benefit from skilled therapeutic intervention in order to improve the following deficits and impairments:  Decreased activity tolerance, Impaired flexibility, Hypomobility, Decreased strength, Decreased range of motion, Decreased endurance, Decreased mobility, Increased muscle spasms, Postural dysfunction, Pain, Improper body mechanics  Visit Diagnosis: Right low back pain, unspecified chronicity, with sciatica presence unspecified  Muscle spasm of back  Other symptoms and signs involving the musculoskeletal system  Abnormal posture     Problem List Patient Active Problem List   Diagnosis Date Noted  . 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 Routine general medical examination at a health care facility 11/11/2016  . Oropharyngeal  dysphagia 03/05/2016  . Lumbar stenosis with neurogenic claudication 09/12/2015  . Hypertension 05/10/2011  . BPH (benign prostatic hyperplasia) 08/18/2010  . OA (osteoarthritis) 11/15/2009  . CARPAL TUNNEL SYNDROME 08/07/2009  . SLEEP APNEA, OBSTRUCTIVE, MODERATE 03/22/2009  . Insomnia w/ sleep apnea 03/22/2009  . Diabetes mellitus without complication ([place]) 35/46/5681  . MELANOMA 11/17/2008  . Hyperlipidemia LDL goal <100 11/17/2008  . ALLERGIC RHINITIS 11/17/2008      Laureen Abrahams, PT, DPT 05/21/17 10:12 AM    [facility] [address] [place], [place], [address] Phone: [phone]   Fax:  [phone]  Name: Dylan [name] MRN: [id number] Date of Birth: 1950-11-15

## 2017-05-23 ENCOUNTER — Encounter: Payer: Self-pay | Admitting: Physical Therapy

## 2017-05-23 ENCOUNTER — Ambulatory Visit: Payer: Medicare HMO | Admitting: Physical Therapy

## 2017-05-23 DIAGNOSIS — R293 Abnormal posture: Secondary | ICD-10-CM

## 2017-05-23 DIAGNOSIS — M545 Low back pain: Secondary | ICD-10-CM | POA: Diagnosis not present

## 2017-05-23 DIAGNOSIS — R29898 Other symptoms and signs involving the musculoskeletal system: Secondary | ICD-10-CM | POA: Diagnosis not present

## 2017-05-23 DIAGNOSIS — M62830 Muscle spasm of back: Secondary | ICD-10-CM

## 2017-05-23 NOTE — Therapy (Signed)
[facility] [address] [place], [place], [address] Phone: [phone]   Fax:  [phone]  Physical Therapy Treatment  Patient Details  Name: Dylan [name] MRN: [id number] Date of Birth: 05-31-1950 Referring Provider: Dr. Scarlette Calico   Encounter Date: 05/23/2017  PT End of Session - 05/23/17 0936    Visit Number  5    Date for PT Re-Evaluation  06/17/17    Authorization Type  [employer]     Authorization Time Period  05/06/17 to 06/17/17    PT Start Time  0931    PT Stop Time  1013    PT Time Calculation (min)  42 min    Activity Tolerance  Patient tolerated treatment well    Behavior During Therapy  [facility] for tasks assessed/performed       Past Medical History:  Diagnosis Date  . Allergy    allergic rhinitis  . Anxiety    xanax as needed   . Arthritis    R knee, hands , back   . Cancer ([place])    melonoma- on face, treated with excision   . Complication of anesthesia    trouble waking up with succycholine, does not have enzyme to break down succycholine  . Diabetes mellitus   . Dizziness    MRI brain unremarkable 02/05/2015.  Marland Kitchen GERD (gastroesophageal reflux disease)   . Headache(784.0)   . Hepatitis age 72   history of infections Hepatitis type a  . History of melanoma 15 yrs ago   face and forehead-- previously followed by Derm in [place]  . History of ulcerative colitis    non recent flares  . Hyperlipidemia   . Hypertension   . Sleep apnea    last sleep study 10 yrs ago,CPAP- intermittent use, not every night      Past Surgical History:  Procedure Laterality Date  . acl and mensicus repair reconstruct Right 1980's  . ANTERIOR CERVICAL DECOMP/DISCECTOMY FUSION  10/15/2011   Procedure: ANTERIOR CERVICAL DECOMPRESSION/DISCECTOMY FUSION 1 LEVEL;  Surgeon: [name], MD;  Location: [place] [facility];  Service: Neurosurgery;  Laterality: N/A;  Cervical four-five Anterior cervical  decompression/diskectomy/fusion  . APPENDECTOMY  1969  . COLONOSCOPY    . HERNIA REPAIR     inguinal / abdominal - repaired as an infant   . JOINT REPLACEMENT    . KNEE ARTHROSCOPY Left last done oct 2013   x 3  . KNEE SURGERY  yrs ago   right knee  . LUMBAR LAMINECTOMY WITH COFLEX 2 LEVEL N/A 09/12/2015   Procedure: L3-4 L4-5 Laminectomy with coflex;  Surgeon: [name], MD;  Location: [place] [facility];  Service: Neurosurgery;  Laterality: N/A;  L3-4 L4-5 Laminectomy with coflex  . NASAL SINUS SURGERY     multiple sinus surgery - 1980's   . TONSILLECTOMY  1969  . TOTAL KNEE ARTHROPLASTY Left 10/22/2012   Procedure: TOTAL KNEE ARTHROPLASTY;  Surgeon: [name], MD;  Location: [facility];  Service: Orthopedics;  Laterality: Left;    There were no vitals filed for this visit.  Subjective Assessment - 05/23/17 0933    Subjective  Back and left hip is still sore, but upper back where I was having pain is almost completely gone.  The towel roll helped when sitting for a long time.    Pertinent History  Cervical ACDF, prior lumbar (L3/L4, L4/L5) surgery, headaches, anxiety, Lt TKA    Limitations  Sitting;House hold activities  How long can you sit comfortably?  10 min    Patient Stated Goals  decrease pain    Currently in Pain?  Yes    Pain Score  6     Pain Location  Back    Pain Orientation  Left;Lower    Pain Descriptors / Indicators  Aching    Pain Type  -- acute on chronic    Pain Onset  1 to 4 weeks ago    Pain Frequency  Intermittent    Aggravating Factors   golf     Multiple Pain Sites  No                No data recorded       [facility] Adult PT Treatment/Exercise - 05/23/17 0001      Neuro Re-ed    Neuro Re-ed Details   TrA activation and posture with standing and supine exercises      Lumbar Exercises: Stretches   Hip Flexor Stretch  --    Figure 4 Stretch  3 reps;20 seconds      Lumbar Exercises: Aerobic   Nustep  Level 4 x 8 minutes      Lumbar  Exercises: Standing   Other Standing Lumbar Exercises  walking with sports cord - cues to brace and activate TrA side stepping 15 lb; fwd/back 20lb - 10 laps each      Lumbar Exercises: Supine   Clam  20 reps green band - single leg    Bridge  20 reps;5 seconds green band for glute med - cues to engage TrA    Large Ball Abdominal Isometric  20 reps roll out with red ball    Large Ball Oblique Isometric  10 reps low trunk rotation with red ball - cues for breathing               PT Short Term Goals - 05/23/17 1057      PT SHORT TERM GOAL #2   Title  Pt will report atleast 30% improvement in his pain from the start of therapy, to improve his activity completion throughout the day.     Time  3    Period  Weeks    Status  On-going      PT SHORT TERM GOAL #3   Title  Pt will demo proper set up and use of the lumbar roll for additional low back support, to allow him to sit atleast 20 minutes without significant pain during his wife's doctor's appointments.     Time  3    Period  Weeks    Status  Achieved        PT Long Term Goals - 05/06/17 1046      PT LONG TERM GOAL #1   Title  Pt will demo improved hip strength to atleast 5/5 MMT which will increase his stability with golfing activity.     Time  6    Period  Weeks    Status  New    Target Date  06/17/17      PT LONG TERM GOAL #2   Title  Pt will demo improved thoracic rotation to atleast 45 deg each direction which will allow him to complete his golf swing without increase in back pain.     Time  6    Period  Weeks    Status  New      PT LONG TERM GOAL #3   Title  Pt will demo improved hamstring  flexibility to lacking no more than 30 deg bilaterally, which will improve his sitting and standing alignment throughout the day.     Time  6    Period  Weeks    Status  New      PT LONG TERM GOAL #4   Title  Pt will report improvements in pain, strength and mobility evident by his ability to complete 18 holes of golf  without stopping.     Time  6    Period  Weeks    Status  New      PT LONG TERM GOAL #5   Title  Pt will report atleast 60% improvement in his back pain from the start of therapy, to improve his quality of life and decrease the need for surgical intervention in the future.     Time  6    Period  Weeks    Status  New            Plan - 05/23/17 1016    Clinical Impression Statement  Pt did well with exercises today.  He reported some increased knee pain with piriformis stretch but was able to perform without pain when posture was corrected.  Pt able to progress to more standing exercises.  He needs cues to brace and reduce lumbar lordosis throughout.  he will benefit from skilled PT to progress postural and core  strength and increase hip and lumbar ROM.      Rehab Potential  Good    PT Treatment/Interventions  ADLs/Self Care Home Management;Cryotherapy;Electrical Stimulation;Moist Heat;Traction;Functional mobility training;Therapeutic activities;Therapeutic exercise;Patient/family education;Neuromuscular re-education;Manual techniques;Taping;Dry needling;Passive range of motion    PT Next Visit Plan  continue to address lumbar/thoracic ROM/flexibility, manual techniques to decrease muscle spasm, progress trunk stability/strengthening, monitor knee pain PRN    Consulted and Agree with Plan of Care  Patient       Patient will benefit from skilled therapeutic intervention in order to improve the following deficits and impairments:  Decreased activity tolerance, Impaired flexibility, Hypomobility, Decreased strength, Decreased range of motion, Decreased endurance, Decreased mobility, Increased muscle spasms, Postural dysfunction, Pain, Improper body mechanics  Visit Diagnosis: Right low back pain, unspecified chronicity, with sciatica presence unspecified  Muscle spasm of back  Other symptoms and signs involving the musculoskeletal system  Abnormal posture     Problem  List Patient Active Problem List   Diagnosis Date Noted  . 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 Routine general medical examination at a health care facility 11/11/2016  . Oropharyngeal dysphagia 03/05/2016  . Lumbar stenosis with neurogenic claudication 09/12/2015  . Hypertension 05/10/2011  . BPH (benign prostatic hyperplasia) 08/18/2010  . OA (osteoarthritis) 11/15/2009  . CARPAL TUNNEL SYNDROME 08/07/2009  . SLEEP APNEA, OBSTRUCTIVE, MODERATE 03/22/2009  . Insomnia w/ sleep apnea 03/22/2009  . Diabetes mellitus without complication ([place]) 33/29/5188  . MELANOMA 11/17/2008  . Hyperlipidemia LDL goal <100 11/17/2008  . ALLERGIC RHINITIS 11/17/2008    Zannie Cove, PT 05/23/2017, 10:59 AM  [facility] [address], [place], [place], [address] Phone: [phone]   Fax:  [phone]  Name: Dylan [name] MRN: [id number] Date of Birth: Jul 14, 1950

## 2017-05-28 ENCOUNTER — Ambulatory Visit: Payer: Medicare HMO | Attending: Internal Medicine | Admitting: Physical Therapy

## 2017-05-28 ENCOUNTER — Encounter: Payer: Self-pay | Admitting: Physical Therapy

## 2017-05-28 DIAGNOSIS — R293 Abnormal posture: Secondary | ICD-10-CM | POA: Insufficient documentation

## 2017-05-28 DIAGNOSIS — M62830 Muscle spasm of back: Secondary | ICD-10-CM | POA: Diagnosis not present

## 2017-05-28 DIAGNOSIS — M545 Low back pain: Secondary | ICD-10-CM | POA: Insufficient documentation

## 2017-05-28 DIAGNOSIS — R29898 Other symptoms and signs involving the musculoskeletal system: Secondary | ICD-10-CM | POA: Insufficient documentation

## 2017-05-28 NOTE — Therapy (Signed)
[facility] [address] [place], [place], [address] Phone: [phone]   Fax:  [phone]  Physical Therapy Treatment/Discharge  Patient Details  Name: Dylan [name] MRN: [id number] Date of Birth: [birth year] Referring Provider: Dr. Scarlette Calico   Encounter Date: 05/28/2017  PT End of Session - 05/28/17 1003    Visit Number  6    Date for PT Re-Evaluation  06/17/17    Authorization Type  [employer]     Authorization Time Period  05/06/17 to 06/17/17    PT Start Time  0930    PT Stop Time  1000 d/c visit    PT Time Calculation (min)  30 min    Activity Tolerance  Patient tolerated treatment well    Behavior During Therapy  [facility] for tasks assessed/performed       Past Medical History:  Diagnosis Date  . Allergy    allergic rhinitis  . Anxiety    xanax as needed   . Arthritis    R knee, hands , back   . Cancer ([place])    melonoma- on face, treated with excision   . Complication of anesthesia    trouble waking up with succycholine, does not have enzyme to break down succycholine  . Diabetes mellitus   . Dizziness    MRI brain unremarkable 02/05/2015.  Marland Kitchen GERD (gastroesophageal reflux disease)   . Headache(784.0)   . Hepatitis age 32   history of infections Hepatitis type a  . History of melanoma 15 yrs ago   face and forehead-- previously followed by Derm in [place]  . History of ulcerative colitis    non recent flares  . Hyperlipidemia   . Hypertension   . Sleep apnea    last sleep study 10 yrs ago,CPAP- intermittent use, not every night      Past Surgical History:  Procedure Laterality Date  . acl and mensicus repair reconstruct Right 1980's  . ANTERIOR CERVICAL DECOMP/DISCECTOMY FUSION  10/15/2011   Procedure: ANTERIOR CERVICAL DECOMPRESSION/DISCECTOMY FUSION 1 LEVEL;  Surgeon: [name], MD;  Location: [place] [facility];  Service: Neurosurgery;  Laterality: N/A;  Cervical four-five Anterior  cervical decompression/diskectomy/fusion  . APPENDECTOMY  1969  . COLONOSCOPY    . HERNIA REPAIR     inguinal / abdominal - repaired as an infant   . JOINT REPLACEMENT    . KNEE ARTHROSCOPY Left last done oct 2013   x 3  . KNEE SURGERY  yrs ago   right knee  . LUMBAR LAMINECTOMY WITH COFLEX 2 LEVEL N/A 09/12/2015   Procedure: L3-4 L4-5 Laminectomy with coflex;  Surgeon: [name], MD;  Location: [place] [facility];  Service: Neurosurgery;  Laterality: N/A;  L3-4 L4-5 Laminectomy with coflex  . NASAL SINUS SURGERY     multiple sinus surgery - 1980's   . TONSILLECTOMY  1969  . TOTAL KNEE ARTHROPLASTY Left 10/22/2012   Procedure: TOTAL KNEE ARTHROPLASTY;  Surgeon: [name], MD;  Location: [facility];  Service: Orthopedics;  Laterality: Left;    There were no vitals filed for this visit.  Subjective Assessment - 05/28/17 0930    Subjective  cleaned house and did yardwork all weekend, knee and bil UEs fro elbow to lateral fingers "are giving me a fit."  has been trying to get scheduled with ortho MD, but unable to get appt scheduled.  otherwise reason for coming to PT seems to be resolved.  swinging clubs without difficulty with upper back  and feels upper back is 100% improved    Patient Stated Goals  decrease pain    Currently in Pain?  -- no pain in upper back; see subjective    Pain Score  0-No pain         [facility] PT Assessment - 05/28/17 0938      Observation/Other Assessments   Focus on Therapeutic Outcomes (FOTO)   94 (6% limitation)      Strength   Right Hip Flexion  5/5    Right Hip Extension  5/5    Right Hip ABduction  5/5    Left Hip Flexion  5/5    Left Hip Extension  5/5    Left Hip ABduction  5/5      Flexibility   Hamstrings  90/90 testing: 30 degrees on Lt; 22 degrees on Rt                   [facility] Adult PT Treatment/Exercise - 05/28/17 0934      Lumbar Exercises: Stretches   Active Hamstring Stretch  Right;Left;2 reps;30 seconds    Hip Flexor  Stretch  Left;Right;20 seconds;2 reps    Figure 4 Stretch  2 reps;30 seconds;Seated;With overpressure    Other Lumbar Stretch Exercise  seated trunk rotation in chair 2x20 sec bil    Other Lumbar Stretch Exercise  book openings 2x30 sec bil      Lumbar Exercises: Aerobic   Nustep  Level 5 x 8 minutes; LEs only      Lumbar Exercises: Sidelying   Clam  Both;20 reps               PT Short Term Goals - 05/28/17 1003      PT SHORT TERM GOAL #1   Title  Pt will demo consistency and independence with his HEP to improve posture and decrease pain with activity.    Time  2    Period  Weeks    Status  Achieved      PT SHORT TERM GOAL #2   Title  Pt will report atleast 30% improvement in his pain from the start of therapy, to improve his activity completion throughout the day.     Time  3    Period  Weeks    Status  Achieved      PT SHORT TERM GOAL #3   Title  Pt will demo proper set up and use of the lumbar roll for additional low back support, to allow him to sit atleast 20 minutes without significant pain during his wife's doctor's appointments.     Time  3    Period  Weeks    Status  Achieved        PT Long Term Goals - 05/28/17 1004      PT LONG TERM GOAL #1   Title  Pt will demo improved hip strength to atleast 5/5 MMT which will increase his stability with golfing activity.     Time  6    Period  Weeks    Status  Achieved      PT LONG TERM GOAL #2   Title  Pt will demo improved thoracic rotation to atleast 45 deg each direction which will allow him to complete his golf swing without increase in back pain.     Time  6    Period  Weeks    Status  Achieved      PT LONG TERM GOAL #3   Title  Pt will demo improved hamstring flexibility to lacking no more than 30 deg bilaterally, which will improve his sitting and standing alignment throughout the day.     Time  6    Period  Weeks    Status  Achieved      PT LONG TERM GOAL #4   Title  Pt will report improvements  in pain, strength and mobility evident by his ability to complete 18 holes of golf without stopping.     Baseline  4/3: able to swing clubs without pain; currently dealing with new onset of Lt knee pain/locking and bil UE ulnar nerve symptoms that affect his ability to play golf    Time  6    Period  Weeks    Status  Partially Met      PT LONG TERM GOAL #5   Title  Pt will report atleast 60% improvement in his back pain from the start of therapy, to improve his quality of life and decrease the need for surgical intervention in the future.     Time  6    Period  Weeks    Status  Achieved            Plan - 05/28/17 1004    Clinical Impression Statement  Pt has met all goals except LTG #4 due to other new issues from tree limb falling hitting Lt knee.  Pt to d/c at this time, and recommend he return if needed once he follows up with MD.    Rehab Potential  Good    PT Treatment/Interventions  ADLs/Self Care Home Management;Cryotherapy;Electrical Stimulation;Moist Heat;Traction;Functional mobility training;Therapeutic activities;Therapeutic exercise;Patient/family education;Neuromuscular re-education;Manual techniques;Taping;Dry needling;Passive range of motion    PT Next Visit Plan  d/c PT today    Consulted and Agree with Plan of Care  Patient       Patient will benefit from skilled therapeutic intervention in order to improve the following deficits and impairments:  Decreased activity tolerance, Impaired flexibility, Hypomobility, Decreased strength, Decreased range of motion, Decreased endurance, Decreased mobility, Increased muscle spasms, Postural dysfunction, Pain, Improper body mechanics  Visit Diagnosis: Right low back pain, unspecified chronicity, with sciatica presence unspecified  Muscle spasm of back  Other symptoms and signs involving the musculoskeletal system  Abnormal posture     Problem List Patient Active Problem List   Diagnosis Date Noted  . Spondylitis  with radiculitis, cervical ([place]) 03/12/2017  . Routine general medical examination at a health care facility 11/11/2016  . Oropharyngeal dysphagia 03/05/2016  . Lumbar stenosis with neurogenic claudication 09/12/2015  . Hypertension 05/10/2011  . BPH (benign prostatic hyperplasia) 08/18/2010  . OA (osteoarthritis) 11/15/2009  . CARPAL TUNNEL SYNDROME 08/07/2009  . SLEEP APNEA, OBSTRUCTIVE, MODERATE 03/22/2009  . Insomnia w/ sleep apnea 03/22/2009  . Diabetes mellitus without complication ([place]) 88/91/6945  . MELANOMA 11/17/2008  . Hyperlipidemia LDL goal <100 11/17/2008  . ALLERGIC RHINITIS 11/17/2008      Laureen Abrahams, PT, DPT 05/28/17 10:07 AM     [facility] [address], [place], [place], [address] Phone: [phone]   Fax:  [phone]  Name: Dylan [name] MRN: [id number] Date of Birth: 1950-07-01     PHYSICAL THERAPY DISCHARGE SUMMARY  Visits from Start of Care: 6  Current functional level related to goals / functional outcomes: See above   Remaining deficits: See above   Education / Equipment: HEP, posture/body mechanics  Plan: Patient agrees to discharge.  Patient goals were met.  Patient is being discharged due to meeting the stated rehab goals.  ?????    Laureen Abrahams, PT, DPT 05/28/17 10:08 AM  [facility] [place], [place] [address]  [phone] (office) [phone] (fax)

## 2017-05-30 ENCOUNTER — Encounter: Payer: Medicare HMO | Admitting: Physical Therapy

## 2017-05-31 ENCOUNTER — Encounter: Payer: Self-pay | Admitting: Internal Medicine

## 2017-05-31 ENCOUNTER — Other Ambulatory Visit: Payer: Self-pay | Admitting: Internal Medicine

## 2017-05-31 DIAGNOSIS — M159 Polyosteoarthritis, unspecified: Secondary | ICD-10-CM

## 2017-05-31 DIAGNOSIS — G5601 Carpal tunnel syndrome, right upper limb: Secondary | ICD-10-CM

## 2017-05-31 DIAGNOSIS — M79642 Pain in left hand: Secondary | ICD-10-CM | POA: Diagnosis not present

## 2017-05-31 DIAGNOSIS — M4692 Unspecified inflammatory spondylopathy, cervical region: Secondary | ICD-10-CM

## 2017-05-31 DIAGNOSIS — M5412 Radiculopathy, cervical region: Secondary | ICD-10-CM

## 2017-05-31 DIAGNOSIS — M150 Primary generalized (osteo)arthritis: Principal | ICD-10-CM

## 2017-05-31 DIAGNOSIS — M48062 Spinal stenosis, lumbar region with neurogenic claudication: Secondary | ICD-10-CM

## 2017-06-02 ENCOUNTER — Other Ambulatory Visit: Payer: Self-pay | Admitting: Internal Medicine

## 2017-06-02 ENCOUNTER — Encounter: Payer: Self-pay | Admitting: Internal Medicine

## 2017-06-02 DIAGNOSIS — M159 Polyosteoarthritis, unspecified: Secondary | ICD-10-CM

## 2017-06-02 DIAGNOSIS — M5412 Radiculopathy, cervical region: Secondary | ICD-10-CM

## 2017-06-02 DIAGNOSIS — M48062 Spinal stenosis, lumbar region with neurogenic claudication: Secondary | ICD-10-CM

## 2017-06-02 DIAGNOSIS — G5601 Carpal tunnel syndrome, right upper limb: Secondary | ICD-10-CM

## 2017-06-02 DIAGNOSIS — M4692 Unspecified inflammatory spondylopathy, cervical region: Secondary | ICD-10-CM

## 2017-06-02 DIAGNOSIS — M150 Primary generalized (osteo)arthritis: Principal | ICD-10-CM

## 2017-06-02 MED ORDER — HYDROCODONE-ACETAMINOPHEN 10-325 MG PO TABS: 1.0000 | ORAL_TABLET | Freq: Four times a day (QID) | ORAL | 0 refills | Status: DC | PRN

## 2017-06-02 NOTE — Telephone Encounter (Signed)
Patient made an appointment on mychart for 4/22.

## 2017-06-04 ENCOUNTER — Encounter: Payer: Medicare HMO | Admitting: Physical Therapy

## 2017-06-06 ENCOUNTER — Other Ambulatory Visit: Payer: Self-pay | Admitting: Adult Health

## 2017-06-06 ENCOUNTER — Encounter: Payer: Medicare HMO | Admitting: Physical Therapy

## 2017-06-06 DIAGNOSIS — G5601 Carpal tunnel syndrome, right upper limb: Secondary | ICD-10-CM | POA: Diagnosis not present

## 2017-06-06 DIAGNOSIS — M7712 Lateral epicondylitis, left elbow: Secondary | ICD-10-CM | POA: Diagnosis not present

## 2017-06-06 DIAGNOSIS — R350 Frequency of micturition: Secondary | ICD-10-CM

## 2017-06-11 ENCOUNTER — Encounter: Payer: Medicare HMO | Admitting: Physical Therapy

## 2017-06-16 ENCOUNTER — Encounter: Payer: Self-pay | Admitting: Internal Medicine

## 2017-06-16 ENCOUNTER — Other Ambulatory Visit (INDEPENDENT_AMBULATORY_CARE_PROVIDER_SITE_OTHER): Payer: Medicare HMO

## 2017-06-16 ENCOUNTER — Ambulatory Visit (INDEPENDENT_AMBULATORY_CARE_PROVIDER_SITE_OTHER): Payer: Medicare HMO | Admitting: Internal Medicine

## 2017-06-16 ENCOUNTER — Other Ambulatory Visit: Payer: Self-pay | Admitting: Internal Medicine

## 2017-06-16 VITALS — BP 134/82 | HR 81 | Temp 98.2°F | Resp 16 | Wt 233.0 lb

## 2017-06-16 DIAGNOSIS — E119 Type 2 diabetes mellitus without complications: Secondary | ICD-10-CM

## 2017-06-16 DIAGNOSIS — R202 Paresthesia of skin: Secondary | ICD-10-CM

## 2017-06-16 DIAGNOSIS — I10 Essential (primary) hypertension: Secondary | ICD-10-CM

## 2017-06-16 DIAGNOSIS — G4700 Insomnia, unspecified: Secondary | ICD-10-CM

## 2017-06-16 DIAGNOSIS — R200 Anesthesia of skin: Secondary | ICD-10-CM

## 2017-06-16 DIAGNOSIS — G4730 Sleep apnea, unspecified: Principal | ICD-10-CM

## 2017-06-16 DIAGNOSIS — Z79891 Long term (current) use of opiate analgesic: Secondary | ICD-10-CM | POA: Insufficient documentation

## 2017-06-16 LAB — BASIC METABOLIC PANEL
BUN: 12 mg/dL (ref 6–23)
CHLORIDE: 104 meq/L (ref 96–112)
CO2: 26 meq/L (ref 19–32)
Calcium: 9.3 mg/dL (ref 8.4–10.5)
Creatinine, Ser: 0.83 mg/dL (ref 0.40–1.50)
GFR: 98.69 mL/min (ref >60.00)
Glucose, Bld: 122 mg/dL — ABNORMAL HIGH (ref 70–99)
Potassium: 4.1 mEq/L (ref 3.5–5.1)
SODIUM: 138 meq/L (ref 135–145)

## 2017-06-16 LAB — MICROALBUMIN / CREATININE URINE RATIO
CREATININE, U: 49.7 mg/dL
MICROALB/CREAT RATIO: 1.4 mg/g (ref 0.0–30.0)
Microalb, Ur: <0.7 mg/dL (ref 0.0–1.9)

## 2017-06-16 LAB — HEMOGLOBIN A1C: HEMOGLOBIN A1C: 6.4 % (ref 4.6–6.5)

## 2017-06-16 LAB — FOLATE: Folate: 14.8 ng/mL (ref >5.9)

## 2017-06-16 LAB — VITAMIN B12: VITAMIN B 12: 374 pg/mL (ref 211–911)

## 2017-06-16 NOTE — Patient Instructions (Signed)

## 2017-06-16 NOTE — Progress Notes (Signed)
Subjective:  Patient ID: Dylan [name], male    DOB: 1950-11-10  Age: 67 y.o. MRN: [id number]  CC: Osteoarthritis   HPI Dylan [name] presents for f/up - He takes hydrocodone for management of chronic pain and he requests a refill.  Since I last saw him he has seen an orthopedist and he had steroids injected in his left elbow and his right wrist.  He says that has helped some with the joint pain but now he complains of stabbing pain in both upper extremities around the wrists and the hands as well as numbness and weakness in both hands.  He has been told that he has carpal tunnel syndrome but he has never had a NCS or EMG performed.  He denies neck pain.  Outpatient Medications Prior to Visit  Medication Sig Dispense Refill  . amLODipine (NORVASC) 5 MG tablet TAKE 1 TABLET BY MOUTH EVERY DAY 90 tablet 0  . aspirin EC 81 MG tablet Take 1 tablet (81 mg total) by mouth daily.    Marland Kitchen dutasteride (AVODART) 0.5 MG capsule daily.    Marland Kitchen esomeprazole (NEXIUM) 40 MG capsule TAKE ONE CAPSULE BY MOUTH EVERY DAY BEFORE BREAKFAST 90 capsule 1  . HYDROcodone-acetaminophen (NORCO) 10-325 MG tablet Take 1 tablet by mouth every 6 (six) hours as needed for severe pain. 90 tablet 0  . irbesartan (AVAPRO) 75 MG tablet TAKE 1 TABLET BY MOUTH EVERY DAY 90 tablet 1  . Melatonin 5 MG TABS Take 1 tablet by mouth at bedtime.     . rosuvastatin (CRESTOR) 10 MG tablet TAKE 1 TABLET BY MOUTH EVERY DAY 90 tablet 1  . tamsulosin (FLOMAX) 0.4 MG CAPS capsule TAKE 1 CAPSULE (0.4 MG TOTAL) BY MOUTH DAILY. 90 capsule 3  . zolpidem (AMBIEN) 10 MG tablet TAKE 1 TABLET BY MOUTH EVERY DAY AT BEDTIME AS NEEDED 30 tablet 2  . metFORMIN (GLUCOPHAGE) 500 MG tablet TAKE 1 TABLET BY MOUTH TWICE A DAY 180 tablet 0   No facility-administered medications prior to visit.     ROS Review of Systems  Constitutional: Negative.  Negative for diaphoresis and fatigue.  HENT: Negative.   Eyes: Negative for visual disturbance.  Respiratory:  Negative for cough, chest tightness, shortness of breath and wheezing.   Cardiovascular: Negative for chest pain, palpitations and leg swelling.  Gastrointestinal: Negative for abdominal pain, constipation, diarrhea, nausea and vomiting.  Endocrine: Negative.   Genitourinary: Negative.  Negative for difficulty urinating.  Musculoskeletal: Positive for arthralgias and myalgias. Negative for gait problem, joint swelling, neck pain and neck stiffness.  Skin: Negative for color change and rash.  Allergic/Immunologic: Negative.   Neurological: Positive for weakness and numbness. Negative for dizziness, tremors and headaches.  Hematological: Negative for adenopathy. Does not bruise/bleed easily.  Psychiatric/Behavioral: Negative.     Objective:  BP 134/82   Pulse 81   Temp 98.2 F (36.8 C) (Oral)   Resp 16   Wt 233 lb (105.7 kg)   SpO2 98%   BMI 32.50 kg/m   BP Readings from Last 3 Encounters:  06/16/17 134/82  03/12/17 (!) 150/90  11/11/16 134/74    Wt Readings from Last 3 Encounters:  06/16/17 233 lb (105.7 kg)  03/12/17 240 lb (108.9 kg)  11/11/16 222 lb 12 oz (101 kg)    Physical Exam  Constitutional: No distress.  HENT:  Mouth/Throat: Oropharynx is clear and moist. No oropharyngeal exudate.  Eyes: Conjunctivae are normal. No scleral icterus.  Neck: Normal range of motion.  Neck supple. No JVD present. No thyromegaly present.  Cardiovascular: Normal rate, regular rhythm and normal heart sounds. Exam reveals no gallop and no friction rub.  No murmur heard. Pulmonary/Chest: Effort normal and breath sounds normal. No respiratory distress. He has no wheezes. He has no rales.  Abdominal: Soft. Bowel sounds are normal. He exhibits no mass. There is no guarding.  Musculoskeletal: Normal range of motion. He exhibits no edema, tenderness or deformity.       Cervical back: Normal. He exhibits normal range of motion, no tenderness, no bony tenderness and no deformity.    Lymphadenopathy:    He has no cervical adenopathy.  Neurological: He is alert. He has normal strength. He displays no atrophy and no tremor. No cranial nerve deficit or sensory deficit. He exhibits normal muscle tone. He displays a negative Romberg sign. Coordination and gait normal.  Reflex Scores:      Tricep reflexes are 1+ on the right side and 1+ on the left side.      Bicep reflexes are 1+ on the right side and 1+ on the left side.      Brachioradialis reflexes are 1+ on the right side and 1+ on the left side.      Patellar reflexes are 1+ on the right side and 1+ on the left side.      Achilles reflexes are 1+ on the right side and 1+ on the left side. Skin: Skin is warm and dry. He is not diaphoretic.  Vitals reviewed.   Lab Results  Component Value Date   WBC 8.5 04/16/2016   HGB 14.2 04/16/2016   HCT 41.8 04/16/2016   PLT 256.0 04/16/2016   GLUCOSE 122 (H) 06/16/2017   CHOL 254 (H) 11/11/2016   TRIG 350.0 (H) 11/11/2016   HDL 62.50 11/11/2016   LDLDIRECT 142.0 11/11/2016   LDLCALC 89 02/15/2014   ALT 22 04/16/2016   AST 16 04/16/2016   NA 138 06/16/2017   K 4.1 06/16/2017   CL 104 06/16/2017   CREATININE 0.83 06/16/2017   BUN 12 06/16/2017   CO2 26 06/16/2017   TSH 1.691 01/30/2015   PSA 1.35 11/11/2016   INR 1.02 10/16/2012   HGBA1C 6.4 06/16/2017   MICROALBUR <0.7 06/16/2017    Dg Abd Acute W/chest  Result Date: 04/16/2016 CLINICAL DATA:  67 year old male with a history of nausea for 6 weeks EXAM: DG ABDOMEN ACUTE W/ 1V CHEST COMPARISON:  09/28/2015, 06/08/2015, 10/16/2012 FINDINGS: Chest: Cardiomediastinal silhouette unchanged in size and contour. No evidence of central vascular congestion or interlobular septal thickening. No pneumothorax. No large pleural effusion. No confluent airspace disease. Abdomen: Surgical changes overlying L4 on L5, similar to comparison studies. Gas within stomach, small bowel, colon, without abnormal distention. Small volume of gas  in the rectum. No unexpected radiopaque foreign body. No unexpected calcifications. No displaced fracture. IMPRESSION: Chest: No radiographic evidence of acute cardiopulmonary disease. Abdomen: Nonobstructive bowel gas pattern with mild stool burden. Surgical changes of L4 and L5 Signed, Dulcy Fanny. Earleen Newport, DO [facility] Electronically Signed   By: Corrie Mckusick D.O.   On: 04/16/2016 17:25    Assessment & Plan:   Ikenna was seen today for osteoarthritis.  Diagnoses and all orders for this visit:  Numbness and tingling in both hands- His neuro exam is within normal limits.  His B12 and folate levels are normal.  I have asked him to undergo an NCS/EMG to try to identify what is causing  his symptoms and to ascertain whether or not he needs to undergo surgery for carpal tunnel syndrome. -     Ambulatory referral to Neurology -     Vitamin B12; Future -     Folate; Future  Diabetes mellitus without complication ([place])- His H9Q is at 6.4%.  His blood sugars are adequately well controlled. -     Microalbumin / creatinine urine ratio; Future -     Basic metabolic panel; Future -     Hemoglobin A1c; Future  Essential hypertension- -     Basic metabolic panel; Future-his blood pressure is well controlled.  Electrolytes and renal function are normal.  Encounter for long-term opiate analgesic use- I will check his urine drug screen to confirm compliance and to screen for substance abuse. -     Pain Mgmt, Profile 8 w/Conf, U; Future   I have discontinued Elyse Jarvis. Weekes's metFORMIN. I am also having him maintain his aspirin EC, Melatonin, dutasteride, irbesartan, amLODipine, rosuvastatin, tamsulosin, esomeprazole, HYDROcodone-acetaminophen, and zolpidem.  No orders of the defined types were placed in this encounter.    Follow-up: Return in about 4 months (around 10/16/2017).  Scarlette Calico, MD

## 2017-06-17 ENCOUNTER — Other Ambulatory Visit: Payer: Self-pay | Admitting: Adult Health

## 2017-06-17 DIAGNOSIS — R350 Frequency of micturition: Secondary | ICD-10-CM

## 2017-06-19 ENCOUNTER — Other Ambulatory Visit: Payer: Self-pay | Admitting: Internal Medicine

## 2017-06-19 ENCOUNTER — Ambulatory Visit (INDEPENDENT_AMBULATORY_CARE_PROVIDER_SITE_OTHER): Payer: Medicare HMO | Admitting: Neurology

## 2017-06-19 ENCOUNTER — Encounter: Payer: Self-pay | Admitting: Internal Medicine

## 2017-06-19 ENCOUNTER — Other Ambulatory Visit: Payer: Self-pay | Admitting: *Deleted

## 2017-06-19 DIAGNOSIS — G5623 Lesion of ulnar nerve, bilateral upper limbs: Secondary | ICD-10-CM | POA: Insufficient documentation

## 2017-06-19 DIAGNOSIS — R200 Anesthesia of skin: Secondary | ICD-10-CM | POA: Diagnosis not present

## 2017-06-19 DIAGNOSIS — G5601 Carpal tunnel syndrome, right upper limb: Secondary | ICD-10-CM

## 2017-06-19 DIAGNOSIS — G5620 Lesion of ulnar nerve, unspecified upper limb: Secondary | ICD-10-CM

## 2017-06-19 NOTE — Procedures (Signed)
[facility] Neu[facility]  [place], [place]  [place], [place] [address] Tel: [phone] Fax:  [phone] Test Date:  06/19/2017  Patient: Dylan [name] DOB: Jul 16, 1950 Physician: Narda Amber, DO  Sex: Male Height: 5\' 11"  Ref Phys: Scarlette Calico, M.D.  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  NCV & EMG Findings: Extensive electrodiagnostic testing of the right upper extremity and additional studies of the left shows:  1. Right median sensory response is absent. Left median and bilateral ulnar sensory responses are within normal limits. 2. Right median motor response shows prolonged latency (5.4 ms). Left median motor responses within normal limits. Bilateral ulnar motor response shows slowed conduction velocity across the elbow (A Elbow-B Elbow, R45, L48 m/s).   3. Chronic motor axon loss changes are seen affecting the right abductor pollicis left deltoid and infraspinatus muscles. There is no evidence of active denervation.  Impression: 1. 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 2. 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 3. Chronic C5 radiculopathy affecting the left upper extremity, mild in degree electrically.    ___________________________ Narda Amber, DO    Nerve Conduction Studies Anti Sensory Summary Table   Stim Site NR Peak (ms) Norm Peak (ms) P-T Amp (V) Norm P-T Amp  Left Median Anti Sensory (2nd Digit)  Wrist    3.7 <3.8 15.0 >10  Right Median Anti Sensory (2nd Digit)  34.1C  Wrist NR  <3.8  >10  Left Ulnar Anti Sensory (5th Digit)  Wrist    2.8 <3.2 8.1 >5  Right Ulnar Anti Sensory (5th Digit)  34.1C  Wrist    2.7 <3.2 8.2 >5   Motor Summary Table   Stim  Site NR Onset (ms) Norm Onset (ms) O-P Amp (mV) Norm O-P Amp Site1 Site2 Delta-0 (ms) Dist (cm) Vel (m/s) Norm Vel (m/s)  Left Median Motor (Abd Poll Brev)  Wrist    3.2 <4.0 6.7 >5 Elbow Wrist 5.3 30.0 57 >50  Elbow    8.5  6.5         Right Median Motor (Abd Poll Brev)  34.1C  Wrist    5.4 <4.0 6.2 >5 Elbow Wrist 6.0 30.0 50 >50  Elbow    11.4  5.9         Left Ulnar Motor (Abd Dig Minimi)  34.1C  Wrist    2.3 <3.1 10.3 >7 B Elbow Wrist 4.0 25.0 63 >50  B Elbow    6.3  9.9  A Elbow B Elbow 2.1 10.0 48 >50  A Elbow    8.4  9.4         Right Ulnar Motor (Abd Dig Minimi)  34.1C  Wrist    2.4 <3.1 9.2 >7 B Elbow Wrist 3.8 23.0 61 >50  B Elbow    6.2  8.8  A Elbow B Elbow 2.2 10.0 45 >50  A Elbow    8.4  8.4          EMG   Side Muscle Ins Act Fibs Psw Fasc Number Recrt Dur Dur. Amp Amp. Poly Poly. Comment  Right 1stDorInt Nml Nml Nml Nml Nml Nml Nml Nml Nml Nml Nml Nml N/A  Right Abd Poll Brev Nml Nml Nml  Nml 1- Rapid Some 1+ Some 1+ Nml Nml N/A  Right PronatorTeres Nml Nml Nml Nml Nml Nml Nml Nml Nml Nml Nml Nml N/A  Right Biceps Nml Nml Nml Nml Nml Nml Nml Nml Nml Nml Nml Nml N/A  Right Triceps Nml Nml Nml Nml Nml Nml Nml Nml Nml Nml Nml Nml N/A  Left Deltoid Nml Nml Nml Nml 1- Rapid Some 1+ Some 1+ Nml Nml N/A  Right FlexDigProf 4,5 Nml Nml Nml Nml Nml Nml Nml Nml Nml Nml Nml Nml N/A  Left Infraspinatus Nml Nml Nml Nml 1- Rapid Some 1+ Some 1+ Nml Nml N/A  Left Abd Poll Brev Nml Nml Nml Nml Nml Nml Nml Nml Nml Nml Nml Nml N/A  Left PronatorTeres Nml Nml Nml Nml Nml Nml Nml Nml Nml Nml Nml Nml N/A  Left Biceps Nml Nml Nml Nml Nml Nml Nml Nml Nml Nml Nml Nml N/A  Left Triceps Nml Nml Nml Nml Nml Nml Nml Nml Nml Nml Nml Nml N/A  Right Deltoid Nml Nml Nml Nml Nml Nml Nml Nml Nml Nml Nml Nml N/A  Left FlexDigProf 4,5 Nml Nml Nml Nml Nml Nml Nml Nml Nml Nml Nml Nml N/A  Left 1stDorInt Nml Nml Nml Nml Nml Nml Nml Nml Nml Nml Nml Nml N/A      Waveforms:

## 2017-06-20 ENCOUNTER — Encounter: Payer: Self-pay | Admitting: Neurology

## 2017-06-20 LAB — PAIN MGMT, PROFILE 8 W/CONF, U
6 Acetylmorphine: NEGATIVE ng/mL (ref <10)
ALCOHOL METABOLITES: NEGATIVE ng/mL (ref <500)
AMPHETAMINES: NEGATIVE ng/mL (ref <500)
Benzodiazepines: NEGATIVE ng/mL (ref <100)
Buprenorphine, Urine: NEGATIVE ng/mL (ref <5)
COCAINE METABOLITE: NEGATIVE ng/mL (ref <150)
CREATININE: 49.5 mg/dL
Codeine: NEGATIVE ng/mL (ref <50)
Hydrocodone: 1237 ng/mL — ABNORMAL HIGH (ref <50)
Hydromorphone: 410 ng/mL — ABNORMAL HIGH (ref <50)
MDMA: NEGATIVE ng/mL (ref <500)
MORPHINE: NEGATIVE ng/mL (ref <50)
Marijuana Metabolite: NEGATIVE ng/mL (ref <20)
NORHYDROCODONE: 763 ng/mL — AB (ref <50)
OPIATES: POSITIVE ng/mL — AB (ref <100)
OXIDANT: NEGATIVE ug/mL (ref <200)
OXYCODONE: NEGATIVE ng/mL (ref <100)
PH: 6.44 (ref 4.5–9.0)

## 2017-06-21 ENCOUNTER — Encounter: Payer: Self-pay | Admitting: Internal Medicine

## 2017-06-21 ENCOUNTER — Other Ambulatory Visit: Payer: Self-pay | Admitting: Internal Medicine

## 2017-06-21 DIAGNOSIS — G5601 Carpal tunnel syndrome, right upper limb: Secondary | ICD-10-CM

## 2017-06-21 DIAGNOSIS — M4692 Unspecified inflammatory spondylopathy, cervical region: Secondary | ICD-10-CM

## 2017-06-21 DIAGNOSIS — M150 Primary generalized (osteo)arthritis: Principal | ICD-10-CM

## 2017-06-21 DIAGNOSIS — M48062 Spinal stenosis, lumbar region with neurogenic claudication: Secondary | ICD-10-CM

## 2017-06-21 DIAGNOSIS — M159 Polyosteoarthritis, unspecified: Secondary | ICD-10-CM

## 2017-06-21 DIAGNOSIS — M5412 Radiculopathy, cervical region: Secondary | ICD-10-CM

## 2017-06-23 ENCOUNTER — Other Ambulatory Visit: Payer: Self-pay | Admitting: Internal Medicine

## 2017-06-23 DIAGNOSIS — M4692 Unspecified inflammatory spondylopathy, cervical region: Secondary | ICD-10-CM

## 2017-06-23 DIAGNOSIS — M159 Polyosteoarthritis, unspecified: Secondary | ICD-10-CM

## 2017-06-23 DIAGNOSIS — M5412 Radiculopathy, cervical region: Secondary | ICD-10-CM

## 2017-06-23 DIAGNOSIS — M48062 Spinal stenosis, lumbar region with neurogenic claudication: Secondary | ICD-10-CM

## 2017-06-23 DIAGNOSIS — G5601 Carpal tunnel syndrome, right upper limb: Secondary | ICD-10-CM

## 2017-06-23 DIAGNOSIS — M150 Primary generalized (osteo)arthritis: Principal | ICD-10-CM

## 2017-06-23 NOTE — Telephone Encounter (Signed)
Check [place] registry last filled 06/02/2017.Marland KitchenJohny Chess

## 2017-06-24 ENCOUNTER — Encounter: Payer: Self-pay | Admitting: Internal Medicine

## 2017-06-24 NOTE — Telephone Encounter (Signed)
Ok to forward to PCP as I have never seen this patient and do not feel comfortable with this tx

## 2017-06-25 ENCOUNTER — Other Ambulatory Visit: Payer: Self-pay | Admitting: Internal Medicine

## 2017-06-25 DIAGNOSIS — M150 Primary generalized (osteo)arthritis: Principal | ICD-10-CM

## 2017-06-25 DIAGNOSIS — G5601 Carpal tunnel syndrome, right upper limb: Secondary | ICD-10-CM

## 2017-06-25 DIAGNOSIS — M159 Polyosteoarthritis, unspecified: Secondary | ICD-10-CM

## 2017-06-25 DIAGNOSIS — M5412 Radiculopathy, cervical region: Secondary | ICD-10-CM

## 2017-06-25 DIAGNOSIS — M48062 Spinal stenosis, lumbar region with neurogenic claudication: Secondary | ICD-10-CM

## 2017-06-25 DIAGNOSIS — M4692 Unspecified inflammatory spondylopathy, cervical region: Secondary | ICD-10-CM

## 2017-06-25 MED ORDER — HYDROCODONE-ACETAMINOPHEN 10-325 MG PO TABS: 1.0000 | ORAL_TABLET | Freq: Four times a day (QID) | ORAL | 0 refills | Status: DC | PRN

## 2017-07-14 ENCOUNTER — Other Ambulatory Visit: Payer: Self-pay | Admitting: Internal Medicine

## 2017-07-17 ENCOUNTER — Other Ambulatory Visit: Payer: Self-pay | Admitting: Internal Medicine

## 2017-07-17 ENCOUNTER — Encounter: Payer: Self-pay | Admitting: Internal Medicine

## 2017-07-17 DIAGNOSIS — M48062 Spinal stenosis, lumbar region with neurogenic claudication: Secondary | ICD-10-CM

## 2017-07-17 DIAGNOSIS — M5412 Radiculopathy, cervical region: Secondary | ICD-10-CM

## 2017-07-17 DIAGNOSIS — M159 Polyosteoarthritis, unspecified: Secondary | ICD-10-CM

## 2017-07-17 DIAGNOSIS — G5601 Carpal tunnel syndrome, right upper limb: Secondary | ICD-10-CM

## 2017-07-17 DIAGNOSIS — M4692 Unspecified inflammatory spondylopathy, cervical region: Secondary | ICD-10-CM

## 2017-07-17 DIAGNOSIS — M150 Primary generalized (osteo)arthritis: Principal | ICD-10-CM

## 2017-07-17 MED ORDER — AMLODIPINE BESYLATE 5 MG PO TABS: 5.0000 mg | ORAL_TABLET | Freq: Every day | ORAL | 1 refills | Status: DC

## 2017-07-18 ENCOUNTER — Other Ambulatory Visit: Payer: Self-pay | Admitting: Internal Medicine

## 2017-07-18 ENCOUNTER — Telehealth: Payer: Self-pay | Admitting: Internal Medicine

## 2017-07-18 DIAGNOSIS — M159 Polyosteoarthritis, unspecified: Secondary | ICD-10-CM

## 2017-07-18 DIAGNOSIS — M5412 Radiculopathy, cervical region: Secondary | ICD-10-CM

## 2017-07-18 DIAGNOSIS — M150 Primary generalized (osteo)arthritis: Principal | ICD-10-CM

## 2017-07-18 DIAGNOSIS — M4692 Unspecified inflammatory spondylopathy, cervical region: Secondary | ICD-10-CM

## 2017-07-18 DIAGNOSIS — G5601 Carpal tunnel syndrome, right upper limb: Secondary | ICD-10-CM

## 2017-07-18 DIAGNOSIS — M48062 Spinal stenosis, lumbar region with neurogenic claudication: Secondary | ICD-10-CM

## 2017-07-18 NOTE — Telephone Encounter (Signed)
Why is he asking for an early refill?

## 2017-07-19 NOTE — Telephone Encounter (Signed)
The patient's daughter called again to request this medication refill. [facility] was advised to let the patient know he needs to see his PCP and we are unable to refill medication on the weekends.

## 2017-07-21 ENCOUNTER — Encounter: Payer: Self-pay | Admitting: Internal Medicine

## 2017-07-22 ENCOUNTER — Other Ambulatory Visit: Payer: Self-pay | Admitting: Internal Medicine

## 2017-07-22 ENCOUNTER — Encounter: Payer: Self-pay | Admitting: Internal Medicine

## 2017-07-22 DIAGNOSIS — Z79891 Long term (current) use of opiate analgesic: Secondary | ICD-10-CM

## 2017-07-22 MED ORDER — HYDROCODONE-ACETAMINOPHEN 10-325 MG PO TABS: 1.0000 | ORAL_TABLET | Freq: Four times a day (QID) | ORAL | 0 refills | Status: DC | PRN

## 2017-07-22 MED ORDER — NALOXONE HCL 4 MG/0.1ML NA LIQD: NASAL | 3 refills | Status: DC

## 2017-07-22 NOTE — Telephone Encounter (Signed)
Son called [facility] on 5/25 as well requesting refill for patient.  Patient was advised per Dr. Yong Channel that he would need a visit with PCP to get refill.

## 2017-07-22 NOTE — Telephone Encounter (Signed)
Medication was refilled this a.m.

## 2017-07-24 ENCOUNTER — Telehealth: Payer: Self-pay | Admitting: Internal Medicine

## 2017-07-24 NOTE — Telephone Encounter (Signed)
Patient is requesting to transfer care from Dr. Ronnald Ramp at [facility] to Dr. Birdie Riddle at [facility] due to his wife and daughter being a patient of hers. Dr. Ronnald Ramp, is this okay with you? Dr. Birdie Riddle, is this okay with you?

## 2017-07-24 NOTE — Telephone Encounter (Signed)
Yes, I am okay with this 

## 2017-07-24 NOTE — Telephone Encounter (Signed)
Copied from [place] [phone]. Topic: Quick Communication - See Telephone Encounter >> Jul 24, 2017  9:12 AM Cleaster Corin, NT wrote: CRM for notification. See Telephone encounter for: 07/24/17.  Pt. Is wanting to establish care with Dr. Birdie Riddle due to his wife and daughter being pt. Of hers. Pt. Is currently a pt. Of Dr. Scarlette Calico at [facility].

## 2017-07-24 NOTE — Telephone Encounter (Signed)
Lmovm for pt to call back and schedule a TOC appointment.

## 2017-07-24 NOTE — Telephone Encounter (Signed)
Ok to transfer. 

## 2017-08-07 DIAGNOSIS — G5601 Carpal tunnel syndrome, right upper limb: Secondary | ICD-10-CM | POA: Diagnosis not present

## 2017-08-07 DIAGNOSIS — M7712 Lateral epicondylitis, left elbow: Secondary | ICD-10-CM | POA: Diagnosis not present

## 2017-08-12 ENCOUNTER — Encounter: Payer: Self-pay | Admitting: Internal Medicine

## 2017-08-12 ENCOUNTER — Other Ambulatory Visit: Payer: Self-pay | Admitting: Internal Medicine

## 2017-08-12 DIAGNOSIS — G5601 Carpal tunnel syndrome, right upper limb: Secondary | ICD-10-CM

## 2017-08-12 DIAGNOSIS — M4692 Unspecified inflammatory spondylopathy, cervical region: Secondary | ICD-10-CM

## 2017-08-12 DIAGNOSIS — M48062 Spinal stenosis, lumbar region with neurogenic claudication: Secondary | ICD-10-CM

## 2017-08-12 DIAGNOSIS — M159 Polyosteoarthritis, unspecified: Secondary | ICD-10-CM

## 2017-08-12 DIAGNOSIS — M5412 Radiculopathy, cervical region: Secondary | ICD-10-CM

## 2017-08-12 DIAGNOSIS — M150 Primary generalized (osteo)arthritis: Principal | ICD-10-CM

## 2017-08-12 NOTE — Telephone Encounter (Signed)
Pt has not yet been seen in this office and just got 100 pills on 5/28- there is no need for a refill.  Also, he needs to understand before he establishes, I do not do chronic pain management.  He will need a referral to a pain center and he will only get enough medication to get him to that appt

## 2017-08-12 NOTE — Telephone Encounter (Signed)
Called and spoke with pt. He is aware that Dr. Birdie Riddle refused to fill medication as pt has not established care with her. Pt did advise that he takes the hydrocodone every 6 hours (4  Day) and he is actually due on Thursday.    Note was routed back to [facility] off as Dr. Ronnald Ramp would have to fill if authorized.   Pt was made aware of Dr. Birdie Riddle note that she does not fill chronic pain meds. Pt said he has been thinking that he should seen pain clinic and is ok to discuss at his new patient appointment and have a referral placed.

## 2017-08-13 ENCOUNTER — Encounter: Payer: Self-pay | Admitting: Family Medicine

## 2017-08-13 NOTE — Telephone Encounter (Signed)
Patient wife called and states that they are wanting a recommendation from Dr. Ronnald Ramp  or a referral to a pain specialist . Please see below message.  If you have any question she states you can call [phone] # [phone]

## 2017-08-14 ENCOUNTER — Ambulatory Visit (INDEPENDENT_AMBULATORY_CARE_PROVIDER_SITE_OTHER): Payer: Medicare HMO | Admitting: Family Medicine

## 2017-08-14 ENCOUNTER — Other Ambulatory Visit: Payer: Self-pay

## 2017-08-14 ENCOUNTER — Encounter: Payer: Self-pay | Admitting: Family Medicine

## 2017-08-14 ENCOUNTER — Encounter: Payer: Self-pay | Admitting: General Practice

## 2017-08-14 VITALS — BP 140/86 | HR 76 | Temp 98.7°F | Resp 16 | Ht 71.0 in | Wt 230.1 lb

## 2017-08-14 DIAGNOSIS — M159 Polyosteoarthritis, unspecified: Secondary | ICD-10-CM

## 2017-08-14 DIAGNOSIS — Z79891 Long term (current) use of opiate analgesic: Secondary | ICD-10-CM | POA: Diagnosis not present

## 2017-08-14 DIAGNOSIS — M150 Primary generalized (osteo)arthritis: Secondary | ICD-10-CM | POA: Diagnosis not present

## 2017-08-14 DIAGNOSIS — M5412 Radiculopathy, cervical region: Secondary | ICD-10-CM | POA: Insufficient documentation

## 2017-08-14 DIAGNOSIS — M4692 Unspecified inflammatory spondylopathy, cervical region: Secondary | ICD-10-CM | POA: Diagnosis not present

## 2017-08-14 DIAGNOSIS — G5601 Carpal tunnel syndrome, right upper limb: Secondary | ICD-10-CM

## 2017-08-14 DIAGNOSIS — M48062 Spinal stenosis, lumbar region with neurogenic claudication: Secondary | ICD-10-CM | POA: Diagnosis not present

## 2017-08-14 MED ORDER — HYDROCODONE-ACETAMINOPHEN 10-325 MG PO TABS: 1.0000 | ORAL_TABLET | Freq: Four times a day (QID) | ORAL | 0 refills | Status: DC | PRN

## 2017-08-14 NOTE — Patient Instructions (Signed)
Follow up as scheduled We will call you with your pain management appt The refill for the hydrocodone has been sent Please consider Acupuncture to help w/ your pain Call with any questions or concerns Hang in there!

## 2017-08-14 NOTE — Progress Notes (Signed)
   Subjective:    Patient ID: Dylan [name], male    DOB: [birth year], 67 y.o.   MRN: [id number]  HPI Chronic pain meds- pt has chronic neck and back pain.  Pt has radiating pain down arms and legs.  Pt has had nerve conduction study- has f/u w/ Dr Posey Pronto in August.  Has been on Lyrica in the past but this caused severe dizziness and he was not able to tolerate.  Pt has seen Dr Nelva Bush previously- had 4 injxns but did not feel this was helpful.  Pt reports he is taking the Hydrocodone every 6 hrs round the clock b/c he is unable to sleep due to pain.  Pt reports he has been on 'really high doses of gabapentin and that didn't do anything'.  He is now having R hip pain- not interested in any more surgery.  Pt is unable to make a fist w/ either hand.   Review of Systems For ROS see HPI     Objective:   Physical Exam  Constitutional: He is oriented to person, place, and time. He appears well-developed and well-nourished.  Obviously uncomfortable  HENT:  Head: Normocephalic and atraumatic.  Musculoskeletal:  Unable to make a fist w/ either hand  Neurological: He is alert and oriented to person, place, and time.  Antalgic gait  Skin: Skin is warm and dry.  Psychiatric: He has a normal mood and affect. His behavior is normal.  Vitals reviewed.         Assessment & Plan:

## 2017-08-17 NOTE — Assessment & Plan Note (Signed)
New to provider, ongoing for pt.  He has pain radiating into both arms from neck.  Had nerve conduction study w/ Dr Posey Pronto.  Discussed injxns- he reports he had these previously w/o relief.  Discussed Lyrica or Gabapentin- Lyrica caused excessive sedation and Gabapentin was ineffective.  Pt is not interested in anymore surgery.  Pt reports pain is severe however he also says he plays golf 2x/week.  He says he is open to alternative pain management regimens rather than just taking high doses of narcotics but pt has a reason to not proceed w/ any of my recommendations.  He is open to accupuncture and seeing pain management.  Referral placed.

## 2017-08-17 NOTE — Assessment & Plan Note (Signed)
New to provider, ongoing for pt.  He has had a major escalation in type of pain medication (tramadol vs hydrocodone), dose (10mg ) and frequency of use (q6 hrs round the clock).  Unclear if this is due to worsening pain, tolerance, addiction, or all of the above.  Reviewed controlled substance database, no red flags.  Discussed that long term opiate management is not something that I do and that I will give him refills until he is able to get into pain management.  There seems to be a disconnect in him saying he wants to limit his use of narcotics but then not being open to any other treatment options due to previous 'failures'.  Will await pain management evaluation.

## 2017-08-17 NOTE — Assessment & Plan Note (Signed)
New to provider, chronic for pt.  Had recent nerve conduction study done and has f/u w/ Dr Posey Pronto in August.  Discussed that narcotics is not the appropriate tx for this type of pain and that injxns, bracing, and surgical intervention would be more appropriate.  Pt is not interested in more surgery at this time.  Will follow along.

## 2017-08-17 NOTE — Assessment & Plan Note (Signed)
New to provider, ongoing for pt.  Again discussed that narcotic is not appropriate pain management for OA.  Pt would do better on daily NSAID and tylenol regimen.  He reports this was not helpful in the past.  Refer to pain management for complete evaluation and treatment.

## 2017-08-20 ENCOUNTER — Other Ambulatory Visit: Payer: Self-pay

## 2017-08-20 ENCOUNTER — Encounter: Payer: Self-pay | Admitting: Family Medicine

## 2017-08-20 ENCOUNTER — Ambulatory Visit (INDEPENDENT_AMBULATORY_CARE_PROVIDER_SITE_OTHER): Payer: 59 | Admitting: Family Medicine

## 2017-08-20 VITALS — BP 130/81 | HR 80 | Temp 98.1°F | Resp 16 | Ht 71.0 in | Wt 230.5 lb

## 2017-08-20 DIAGNOSIS — R300 Dysuria: Secondary | ICD-10-CM | POA: Diagnosis not present

## 2017-08-20 DIAGNOSIS — I10 Essential (primary) hypertension: Secondary | ICD-10-CM

## 2017-08-20 DIAGNOSIS — E669 Obesity, unspecified: Secondary | ICD-10-CM

## 2017-08-20 DIAGNOSIS — R7303 Prediabetes: Secondary | ICD-10-CM | POA: Diagnosis not present

## 2017-08-20 DIAGNOSIS — E785 Hyperlipidemia, unspecified: Secondary | ICD-10-CM | POA: Diagnosis not present

## 2017-08-20 NOTE — Assessment & Plan Note (Signed)
New to provider, ongoing for pt x1 yr.  Pt reports every urination is painful and difficult to initiate w/ weak stream.  Refer to urology for complete evaluation and tx as pt is already on Avodart and Flomax

## 2017-08-20 NOTE — Assessment & Plan Note (Signed)
Chronic problem.  Adequate control.  Asymptomatic at this time.  Check labs.  No anticipated med changes at this time.  Will follow.

## 2017-08-20 NOTE — Assessment & Plan Note (Signed)
Ongoing issue for pt.  Stressed need for healthy diet and exercise as able.  Check labs to risk stratify.  Will follow.

## 2017-08-20 NOTE — Progress Notes (Signed)
   Subjective:    Patient ID: Dylan [name], male    DOB: 1950/03/27, 67 y.o.   MRN: [id number]  HPI HTN- chronic problem, on Amlodipine 5mg  daily, Irbesartan 75mg  daily w/ adequate control.  No CP, SOB, HAs, edema.  Hyperlipidemia- chronic problem, on Crestor 10mg  daily.  No abd pain, N/V.  Obesity- pt's BMI is 32.3.  Walking the golf course once weekly but limited in ability to exercise due to multiple orthopedic issues  Painful urination- pt reports it is very painful to urinate, 'like i'm trying to pass a football'.  Weak stream.  Taking Flomax and Avodart.  If he forgets to take a pill, 'I barely pee at all'.  sxs have been ongoing x1 yr.  Review of Systems For ROS see HPI     Objective:   Physical Exam  Constitutional: He is oriented to person, place, and time. He appears well-developed and well-nourished. No distress.  HENT:  Head: Normocephalic and atraumatic.  Eyes: Pupils are equal, round, and reactive to light. Conjunctivae and EOM are normal.  Neck: Normal range of motion. Neck supple. No thyromegaly present.  Cardiovascular: Normal rate, regular rhythm, normal heart sounds and intact distal pulses.  No murmur heard. Pulmonary/Chest: Effort normal and breath sounds normal. No respiratory distress.  Abdominal: Soft. Bowel sounds are normal. He exhibits no distension.  Musculoskeletal: He exhibits no edema.  Lymphadenopathy:    He has no cervical adenopathy.  Neurological: He is alert and oriented to person, place, and time. No cranial nerve deficit.  Skin: Skin is warm and dry.  Psychiatric: He has a normal mood and affect. His behavior is normal.  Vitals reviewed.         Assessment & Plan:

## 2017-08-20 NOTE — Patient Instructions (Addendum)
Schedule your complete physical in 6 months We'll notify you of your lab results and make any changes if needed Continue to work on healthy diet and regular exercise- you can do it! We'll call you with your Urology appt Call with any questions or concerns Have a great summer!!

## 2017-08-20 NOTE — Assessment & Plan Note (Signed)
Chronic problem.  On Crestor 10mg daily w/o difficulty.  Check labs.  Adjust meds prn  

## 2017-08-21 ENCOUNTER — Encounter: Payer: Self-pay | Admitting: General Practice

## 2017-08-21 LAB — CBC WITH DIFFERENTIAL/PLATELET
BASOS PCT: 1 % (ref 0.0–3.0)
Basophils Absolute: 0.1 10*3/uL (ref 0.0–0.1)
EOS ABS: 0.3 10*3/uL (ref 0.0–0.7)
EOS PCT: 3.4 % (ref 0.0–5.0)
HCT: 39.3 % (ref 39.0–52.0)
Hemoglobin: 13.4 g/dL (ref 13.0–17.0)
LYMPHS ABS: 3.5 10*3/uL (ref 0.7–4.0)
Lymphocytes Relative: 45.8 % (ref 12.0–46.0)
MCHC: 34 g/dL (ref 30.0–36.0)
MCV: 89.3 fl (ref 78.0–100.0)
MONO ABS: 0.7 10*3/uL (ref 0.1–1.0)
Monocytes Relative: 9.1 % (ref 3.0–12.0)
NEUTROS ABS: 3.1 10*3/uL (ref 1.4–7.7)
Neutrophils Relative %: 40.7 % — ABNORMAL LOW (ref 43.0–77.0)
PLATELETS: 296 10*3/uL (ref 150.0–400.0)
RBC: 4.4 Mil/uL (ref 4.22–5.81)
RDW: 13.5 % (ref 11.5–15.5)
WBC: 7.7 10*3/uL (ref 4.0–10.5)

## 2017-08-21 LAB — BASIC METABOLIC PANEL
BUN: 22 mg/dL (ref 6–23)
CHLORIDE: 103 meq/L (ref 96–112)
CO2: 29 mEq/L (ref 19–32)
Calcium: 9.6 mg/dL (ref 8.4–10.5)
Creatinine, Ser: 0.89 mg/dL (ref 0.40–1.50)
GFR: 91.01 mL/min (ref >60.00)
GLUCOSE: 95 mg/dL (ref 70–99)
POTASSIUM: 4.2 meq/L (ref 3.5–5.1)
SODIUM: 139 meq/L (ref 135–145)

## 2017-08-21 LAB — LIPID PANEL
CHOL/HDL RATIO: 4
CHOLESTEROL: 169 mg/dL (ref 0–200)
HDL: 47.4 mg/dL (ref >39.00)
LDL Cholesterol: 82 mg/dL (ref 0–99)
NonHDL: 121.76
Triglycerides: 198 mg/dL — ABNORMAL HIGH (ref 0.0–149.0)
VLDL: 39.6 mg/dL (ref 0.0–40.0)

## 2017-08-21 LAB — HEPATIC FUNCTION PANEL
ALK PHOS: 47 U/L (ref 39–117)
ALT: 13 U/L (ref 0–53)
AST: 14 U/L (ref 0–37)
Albumin: 4.5 g/dL (ref 3.5–5.2)
BILIRUBIN DIRECT: 0.1 mg/dL (ref 0.0–0.3)
BILIRUBIN TOTAL: 0.4 mg/dL (ref 0.2–1.2)
Total Protein: 7 g/dL (ref 6.0–8.3)

## 2017-08-21 LAB — TSH: TSH: 1.72 u[IU]/mL (ref 0.35–4.50)

## 2017-08-23 ENCOUNTER — Encounter: Payer: Self-pay | Admitting: Family Medicine

## 2017-08-25 DIAGNOSIS — M7712 Lateral epicondylitis, left elbow: Secondary | ICD-10-CM | POA: Diagnosis not present

## 2017-08-25 DIAGNOSIS — G5601 Carpal tunnel syndrome, right upper limb: Secondary | ICD-10-CM | POA: Diagnosis not present

## 2017-08-31 DIAGNOSIS — E119 Type 2 diabetes mellitus without complications: Secondary | ICD-10-CM | POA: Diagnosis not present

## 2017-08-31 DIAGNOSIS — Z0100 Encounter for examination of eyes and vision without abnormal findings: Secondary | ICD-10-CM | POA: Diagnosis not present

## 2017-09-03 ENCOUNTER — Telehealth: Payer: Self-pay | Admitting: General Practice

## 2017-09-03 DIAGNOSIS — M79676 Pain in unspecified toe(s): Secondary | ICD-10-CM

## 2017-09-03 NOTE — Telephone Encounter (Signed)
Please advise?   Copied from [place] [phone]. Topic: Referral - Request >> Sep 03, 2017 11:25 AM Oliver Pila B wrote: Reason for CRM: pt called to get a referral for Dr. Jenne Campus [facility] on [address]

## 2017-09-03 NOTE — Telephone Encounter (Signed)
Ok w/ referral.  Will need to know dx from pt in order to submit a referral

## 2017-09-03 NOTE — Telephone Encounter (Signed)
Spoke with pt. He is having toe pain and redness. Referral placed.

## 2017-09-04 ENCOUNTER — Encounter: Payer: Self-pay | Admitting: Family Medicine

## 2017-09-04 DIAGNOSIS — L600 Ingrowing nail: Secondary | ICD-10-CM | POA: Diagnosis not present

## 2017-09-04 DIAGNOSIS — M25774 Osteophyte, right foot: Secondary | ICD-10-CM | POA: Diagnosis not present

## 2017-09-10 ENCOUNTER — Encounter: Payer: Self-pay | Admitting: Family Medicine

## 2017-09-10 DIAGNOSIS — G4730 Sleep apnea, unspecified: Principal | ICD-10-CM

## 2017-09-10 DIAGNOSIS — M4692 Unspecified inflammatory spondylopathy, cervical region: Secondary | ICD-10-CM

## 2017-09-10 DIAGNOSIS — G4700 Insomnia, unspecified: Secondary | ICD-10-CM

## 2017-09-10 DIAGNOSIS — M5412 Radiculopathy, cervical region: Secondary | ICD-10-CM

## 2017-09-10 DIAGNOSIS — M48062 Spinal stenosis, lumbar region with neurogenic claudication: Secondary | ICD-10-CM

## 2017-09-10 DIAGNOSIS — G5601 Carpal tunnel syndrome, right upper limb: Secondary | ICD-10-CM

## 2017-09-10 DIAGNOSIS — M150 Primary generalized (osteo)arthritis: Principal | ICD-10-CM

## 2017-09-10 DIAGNOSIS — M159 Polyosteoarthritis, unspecified: Secondary | ICD-10-CM

## 2017-09-10 MED ORDER — ZOLPIDEM TARTRATE 10 MG PO TABS: ORAL_TABLET | ORAL | 2 refills | Status: DC

## 2017-09-10 MED ORDER — HYDROCODONE-ACETAMINOPHEN 10-325 MG PO TABS: 1.0000 | ORAL_TABLET | Freq: Four times a day (QID) | ORAL | 0 refills | Status: DC | PRN

## 2017-09-10 NOTE — Telephone Encounter (Signed)
Last OV 08/20/17 Hydrocodone last filled 08/14/17 #120 with 0

## 2017-09-12 ENCOUNTER — Other Ambulatory Visit: Payer: Self-pay | Admitting: Internal Medicine

## 2017-09-12 ENCOUNTER — Other Ambulatory Visit: Payer: Self-pay | Admitting: Family Medicine

## 2017-09-12 DIAGNOSIS — I10 Essential (primary) hypertension: Secondary | ICD-10-CM

## 2017-09-12 DIAGNOSIS — M48062 Spinal stenosis, lumbar region with neurogenic claudication: Secondary | ICD-10-CM

## 2017-09-12 DIAGNOSIS — M4692 Unspecified inflammatory spondylopathy, cervical region: Secondary | ICD-10-CM

## 2017-09-12 DIAGNOSIS — G5601 Carpal tunnel syndrome, right upper limb: Secondary | ICD-10-CM

## 2017-09-12 DIAGNOSIS — M150 Primary generalized (osteo)arthritis: Principal | ICD-10-CM

## 2017-09-12 DIAGNOSIS — M5412 Radiculopathy, cervical region: Secondary | ICD-10-CM

## 2017-09-12 DIAGNOSIS — E119 Type 2 diabetes mellitus without complications: Secondary | ICD-10-CM

## 2017-09-12 DIAGNOSIS — M159 Polyosteoarthritis, unspecified: Secondary | ICD-10-CM

## 2017-09-12 NOTE — Telephone Encounter (Addendum)
PCP was changed to [place] back in June.

## 2017-09-12 NOTE — Telephone Encounter (Signed)
Pt needs a 4 month follow up before I can send in refills. Will you call to have pt schedule.

## 2017-09-15 DIAGNOSIS — L600 Ingrowing nail: Secondary | ICD-10-CM | POA: Diagnosis not present

## 2017-09-25 DIAGNOSIS — G5601 Carpal tunnel syndrome, right upper limb: Secondary | ICD-10-CM | POA: Diagnosis not present

## 2017-09-25 DIAGNOSIS — M7711 Lateral epicondylitis, right elbow: Secondary | ICD-10-CM | POA: Diagnosis not present

## 2017-10-03 ENCOUNTER — Encounter (HOSPITAL_BASED_OUTPATIENT_CLINIC_OR_DEPARTMENT_OTHER): Payer: Self-pay | Admitting: Emergency Medicine

## 2017-10-03 ENCOUNTER — Emergency Department (HOSPITAL_BASED_OUTPATIENT_CLINIC_OR_DEPARTMENT_OTHER)
Admission: EM | Admit: 2017-10-03 | Discharge: 2017-10-03 | Disposition: A | Discharge status: Home / Self Care | Payer: Medicare HMO | Attending: Emergency Medicine | Admitting: Emergency Medicine

## 2017-10-03 ENCOUNTER — Other Ambulatory Visit: Payer: Self-pay

## 2017-10-03 DIAGNOSIS — I10 Essential (primary) hypertension: Secondary | ICD-10-CM | POA: Diagnosis not present

## 2017-10-03 DIAGNOSIS — Z85828 Personal history of other malignant neoplasm of skin: Secondary | ICD-10-CM | POA: Insufficient documentation

## 2017-10-03 DIAGNOSIS — Z87891 Personal history of nicotine dependence: Secondary | ICD-10-CM | POA: Insufficient documentation

## 2017-10-03 DIAGNOSIS — Z96652 Presence of left artificial knee joint: Secondary | ICD-10-CM | POA: Diagnosis not present

## 2017-10-03 DIAGNOSIS — L02412 Cutaneous abscess of left axilla: Secondary | ICD-10-CM | POA: Insufficient documentation

## 2017-10-03 DIAGNOSIS — Z79899 Other long term (current) drug therapy: Secondary | ICD-10-CM | POA: Diagnosis not present

## 2017-10-03 DIAGNOSIS — L0291 Cutaneous abscess, unspecified: Secondary | ICD-10-CM | POA: Diagnosis present

## 2017-10-03 DIAGNOSIS — Z7982 Long term (current) use of aspirin: Secondary | ICD-10-CM | POA: Diagnosis not present

## 2017-10-03 MED ORDER — LIDOCAINE-EPINEPHRINE (PF) 2 %-1:200000 IJ SOLN
INTRAMUSCULAR | Status: AC
  Filled 2017-10-03: qty 10

## 2017-10-03 MED ORDER — CLINDAMYCIN HCL 300 MG PO CAPS: 300.0000 mg | ORAL_CAPSULE | Freq: Three times a day (TID) | ORAL | 0 refills | Status: AC

## 2017-10-03 MED ORDER — LIDOCAINE-EPINEPHRINE (PF) 2 %-1:200000 IJ SOLN
20.0000 mL | Freq: Once | INTRAMUSCULAR | Status: DC
  Filled 2017-10-03: qty 20

## 2017-10-03 MED ORDER — LIDOCAINE-EPINEPHRINE (PF) 1 %-1:200000 IJ SOLN
INTRAMUSCULAR | Status: AC
  Filled 2017-10-03: qty 10

## 2017-10-03 NOTE — ED Notes (Signed)
ED Provider at bedside. 

## 2017-10-03 NOTE — ED Notes (Signed)
Pt. Is a very clean neat individual who reports he has never had any kind of abscess like this before.

## 2017-10-03 NOTE — Discharge Instructions (Signed)
Your abscess was completely and successfully drained today. Take the antibiotics I prescribed for the full seven (7) days. Follow-up with a medical provider if you have one or more of the following symptoms: fever; increased redness, warmth or tenderness at the wound site; pain beyond where the initial wound was; unusual discharge.  Hopefully, this does not mess up your golf swing too bad. Have a good weekend!

## 2017-10-03 NOTE — ED Provider Notes (Signed)
[facility] EMERGENCY DEPARTMENT Provider Note  CSN: [id number] Arrival date & time: 10/03/17  1542    History   Chief Complaint Chief Complaint  Patient presents with  . Abscess    HPI Dylan [name] is a 67 y.o. male with a medical history of Type 2 DM, Hepatitis A, HLD and OSA who presented to the ED for abscess. He reports a boil underneath his left armpit x1 week that has grown in size. He has been able to express pus from the site. Denies fever, arthralgias, other skin rashes/lesions or redness beyond the site. Denies recent wounds or insect/animal bites.  Past Medical History:  Diagnosis Date  . Allergy    allergic rhinitis  . Anxiety    xanax as needed   . Arthritis    R knee, hands , back   . Cancer ([place])    melonoma- on face, treated with excision   . Complication of anesthesia    trouble waking up with succycholine, does not have enzyme to break down succycholine  . Diabetes mellitus    was prediabetic  . Dizziness    MRI brain unremarkable 02/05/2015.  Marland Kitchen GERD (gastroesophageal reflux disease)   . Headache(784.0)   . Hepatitis age 72   history of infections Hepatitis type a  . History of melanoma 15 yrs ago   face and forehead-- previously followed by Derm in [place]  . History of ulcerative colitis    non recent flares  . Hyperlipidemia   . Hypertension   . Sleep apnea    last sleep study 10 yrs ago,CPAP- intermittent use, not every night      Patient Active Problem List   Diagnosis Date Noted  . Obesity (BMI 30.0-34.9) 08/20/2017  . Difficult or painful urination 08/20/2017  . Cervical radiculopathy 08/14/2017  . Chronic use of opiate for therapeutic purpose 07/22/2017  . Ulnar neuropathy of both upper extremities 06/19/2017  . Numbness and tingling in both hands 06/16/2017  . Encounter for long-term opiate analgesic use 06/16/2017  . 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 Routine general medical examination at a  health care facility 11/11/2016  . Oropharyngeal dysphagia 03/05/2016  . Lumbar stenosis with neurogenic claudication 09/12/2015  . Hypertension 05/10/2011  . BPH (benign prostatic hyperplasia) 08/18/2010  . OA (osteoarthritis) 11/15/2009  . CARPAL TUNNEL SYNDROME 08/07/2009  . SLEEP APNEA, OBSTRUCTIVE, MODERATE 03/22/2009  . Insomnia w/ sleep apnea 03/22/2009  . MELANOMA 11/17/2008  . Hyperlipidemia LDL goal <100 11/17/2008  . ALLERGIC RHINITIS 11/17/2008    Past Surgical History:  Procedure Laterality Date  . acl and mensicus repair reconstruct Right 1980's  . ANTERIOR CERVICAL DECOMP/DISCECTOMY FUSION  10/15/2011   Procedure: ANTERIOR CERVICAL DECOMPRESSION/DISCECTOMY FUSION 1 LEVEL;  Surgeon: [name], MD;  Location: [facility];  Service: Neurosurgery;  Laterality: N/A;  Cervical four-five Anterior cervical decompression/diskectomy/fusion  . APPENDECTOMY  1969  . COLONOSCOPY    . HERNIA REPAIR     inguinal / abdominal - repaired as an infant   . JOINT REPLACEMENT    . KNEE ARTHROSCOPY Left last done oct 2013   x 3  . KNEE SURGERY  yrs ago   right knee  . LUMBAR LAMINECTOMY WITH COFLEX 2 LEVEL N/A 09/12/2015   Procedure: L3-4 L4-5 Laminectomy with coflex;  Surgeon: [name], MD;  Location: [facility];  Service: Neurosurgery;  Laterality: N/A;  L3-4 L4-5 Laminectomy with coflex  . NASAL SINUS SURGERY     multiple  sinus surgery - 1980's   . TONSILLECTOMY  1969  . TOTAL KNEE ARTHROPLASTY Left 10/22/2012   Procedure: TOTAL KNEE ARTHROPLASTY;  Surgeon: [name], MD;  Location: [facility];  Service: Orthopedics;  Laterality: Left;        Home Medications    Prior to Admission medications   Medication Sig Start Date End Date Taking? Authorizing Provider  amLODipine (NORVASC) 5 MG tablet Take 1 tablet (5 mg total) by mouth daily. 07/17/17   [name], MD  aspirin EC 81 MG tablet Take 1 tablet (81 mg total) by mouth daily. 10/24/12   [name], [name]    clindamycin (CLEOCIN) 300 MG capsule Take 1 capsule (300 mg total) by mouth 3 (three) times daily for 7 days. 10/03/17 10/10/17  Kimo Bancroft, Alvie Heidelberg I, [name]  dutasteride (AVODART) 0.5 MG capsule TAKE 1 CAPSULE (0.5 MG TOTAL) BY MOUTH DAILY. 06/18/17   [name], MD  esomeprazole (NEXIUM) 40 MG capsule TAKE ONE CAPSULE BY MOUTH EVERY DAY BEFORE BREAKFAST 05/19/17   [name], MD  HYDROcodone-acetaminophen (NORCO) 10-325 MG tablet Take 1 tablet by mouth every 6 (six) hours as needed for severe pain. 09/10/17   [name], MD  irbesartan (AVAPRO) 75 MG tablet TAKE 1 TABLET BY MOUTH EVERY DAY 09/12/17   [name], MD  Melatonin 5 MG TABS Take 1 tablet by mouth at bedtime.     [provider]  naloxone [facility]) nasal spray 4 mg/0.1 mL Use as directed Patient not taking: Reported on 08/14/2017 07/22/17   [name], MD  rosuvastatin (CRESTOR) 10 MG tablet TAKE 1 TABLET BY MOUTH EVERY DAY 05/11/17   [name], MD  tamsulosin (FLOMAX) 0.4 MG CAPS capsule Take 0.4 mg by mouth.    [provider]  zolpidem (AMBIEN) 10 MG tablet TAKE 1 TABLET BY MOUTH EVERY DAY AT BEDTIME AS NEEDED 09/10/17   [name], MD    Family History Family History  Problem Relation Age of Onset  . Cancer Brother        brain tumor  . Ovarian cancer Mother   . Diabetes Father   . Colon cancer Neg Hx   . Stomach cancer Neg Hx   . Rectal cancer Neg Hx   . Esophageal cancer Neg Hx   . Liver cancer Neg Hx     Social History Social History   Tobacco Use  . Smoking status: Never Smoker  . Smokeless tobacco: Former Systems developer    Types: Chew  Substance Use Topics  . Alcohol use: No  . Drug use: No     Allergies   Heparin; Succinylcholine chloride; Vancomycin; and Penicillins   Review of Systems Review of Systems  Constitutional: Negative for chills and fever.  Musculoskeletal: Negative.   Skin: Negative for rash and wound.       Abscess under left armpit   Allergic/Immunologic: Positive for environmental allergies.  Hematological: Does not bruise/bleed easily.     Physical Exam Updated Vital Signs BP (!) 148/80 (BP Location: Right Arm)   Pulse 92   Temp 98 F (36.7 C) (Oral)   Resp 16   Ht 5\' 11"  (1.803 m)   Wt 106.1 kg   SpO2 97%   BMI 32.64 kg/m   Physical Exam  Constitutional: He appears well-developed and well-nourished.  Cardiovascular: Intact distal pulses and normal pulses.  Musculoskeletal: Normal range of motion.       Arms: Neurological: He has normal  strength. No sensory deficit.  Skin: Skin is warm and intact. Capillary refill takes less than 2 seconds.  4 cm erythematous, fluctuant abscess on proximal portion of left axilla. No surrounding erythema or streaking.  Nursing note and vitals reviewed.   ED Treatments / Results  Labs (all labs ordered are listed, but only abnormal results are displayed) Labs Reviewed - No data to display  EKG None  Radiology No results found.  Procedures .Marland KitchenIncision and Drainage Date/Time: 10/03/2017 6:31 PM Performed by: Romie Jumper, [name] Authorized by: Romie Jumper, [name]   Consent:    Consent obtained:  Verbal   Consent given by:  Patient   Risks discussed:  Bleeding, incomplete drainage, pain and infection   Alternatives discussed:  No treatment Location:    Type:  Abscess   Size:  4cm   Location:  Upper extremity   Upper extremity location: axilla. Pre-procedure details:    Skin preparation:  Betadine Anesthesia (see MAR for exact dosages):    Anesthesia method:  Local infiltration   Local anesthetic:  Lidocaine 2% WITH epi Procedure type:    Complexity:  Simple Procedure details:    Incision types:  Single straight   Incision depth:  Subcutaneous   Scalpel blade:  11   Wound management:  Probed and deloculated and irrigated with saline   Drainage:  Purulent   Drainage amount:  Scant   Wound treatment:  Wound left open   Packing materials:   None Post-procedure details:    Patient tolerance of procedure:  Tolerated well, no immediate complications   (including critical care time)  Medications Ordered in ED Medications  lidocaine-EPINEPHrine (XYLOCAINE W/EPI) 2 %-1:200000 (PF) injection 20 mL (has no administration in time range)     Initial Impression / Assessment and Plan / ED Course  Triage vital signs and the nursing notes have been reviewed.  Pertinent labs & imaging results that were available during care of the patient were reviewed and considered in medical decision making (see chart for details).   Patient presented with a left axillary abscess. There was no surrounding erythema, streaking or systemic s/s to suggest cellulitis or a more widespread infection. I&D was performed without complication. Clindamycin prescribed for outpatient treatment.  Final Clinical Impressions(s) / ED Diagnoses  1. Left Arm Abscess. I&D performed without complication. Clindamycin 300mg  TID x7 days. Education provided on wound care, follow-up and s/s of infection.  Dispo: Home. After thorough clinical evaluation, this patient is determined to be medically stable and can be safely discharged with the previously mentioned treatment and/or outpatient follow-up/referral(s). At this time, there are no other apparent medical conditions that require further screening, evaluation or treatment.   Final diagnoses:  Abscess of left axilla    ED Discharge Orders         Ordered    clindamycin (CLEOCIN) 300 MG capsule  3 times daily     10/03/17 [address], Rose I, [name] 10/03/17 Stann Ore, MD 10/03/17 [phone]

## 2017-10-03 NOTE — ED Triage Notes (Signed)
Abscess to left axilla since Sunday.  No drainage at present.

## 2017-10-10 ENCOUNTER — Ambulatory Visit (INDEPENDENT_AMBULATORY_CARE_PROVIDER_SITE_OTHER): Payer: Medicare HMO | Admitting: Family Medicine

## 2017-10-10 ENCOUNTER — Other Ambulatory Visit: Payer: Self-pay

## 2017-10-10 ENCOUNTER — Encounter: Payer: Self-pay | Admitting: Family Medicine

## 2017-10-10 VITALS — BP 134/86 | HR 89 | Temp 98.2°F | Resp 16 | Ht 71.0 in | Wt 228.1 lb

## 2017-10-10 DIAGNOSIS — M5412 Radiculopathy, cervical region: Secondary | ICD-10-CM | POA: Diagnosis not present

## 2017-10-10 DIAGNOSIS — M150 Primary generalized (osteo)arthritis: Secondary | ICD-10-CM

## 2017-10-10 DIAGNOSIS — L02419 Cutaneous abscess of limb, unspecified: Secondary | ICD-10-CM | POA: Diagnosis not present

## 2017-10-10 DIAGNOSIS — M159 Polyosteoarthritis, unspecified: Secondary | ICD-10-CM

## 2017-10-10 DIAGNOSIS — M4692 Unspecified inflammatory spondylopathy, cervical region: Secondary | ICD-10-CM | POA: Diagnosis not present

## 2017-10-10 MED ORDER — DOXYCYCLINE HYCLATE 100 MG PO TABS
100.0000 mg | ORAL_TABLET | Freq: Two times a day (BID) | ORAL | 0 refills | Status: DC
Start: 2017-10-10 — End: 2017-11-14

## 2017-10-10 MED ORDER — HYDROCODONE-ACETAMINOPHEN 10-325 MG PO TABS: 1.0000 | ORAL_TABLET | Freq: Four times a day (QID) | ORAL | 0 refills | Status: DC | PRN

## 2017-10-10 MED ORDER — MUPIROCIN 2 % EX OINT: 1.0000 "application " | TOPICAL_OINTMENT | Freq: Two times a day (BID) | CUTANEOUS | 0 refills | Status: DC

## 2017-10-10 NOTE — Patient Instructions (Signed)
Follow up as needed or as scheduled STOP the Clindamycin START the Doxycycline twice daily- w/ food- starting tonight Apply the Mupirocin twice daily Use a separate washcloth for underarms so you don't spread to the rest of the body Do not shave the area- the razor will also spread infection Avoid wearing deodorant at this time and when areas have scabbed, use a new deodorant to avoid spreading If no improvement or worsening, please let us know Hang in there!

## 2017-10-10 NOTE — Progress Notes (Signed)
   Subjective:    Patient ID: Dylan [name], male    DOB: 1950-10-23, 67 y.o.   MRN: [id number]  HPI Abscess- pt was seen in ER 8/9 for L underarm abscess and started on Clinda TID x7 days and Chlorhexidine scrub.  ER did I&D.  Pt reports 'it was healing up good' until yesterday when he developed '5 or 6 more' and now has 1 under R arm.  Pt has 3 doses of Clinda remaining.  Pt has not been using the same washcloth since I&D.  No fevers.  No hx of similar.  Pt is using same stick of deodorant.  Also shaved L armpit.  Clinda has been causing diarrhea.   Review of Systems For ROS see HPI     Objective:   Physical Exam  Constitutional: He appears well-developed and well-nourished. No distress.  HENT:  Head: Normocephalic and atraumatic.  Lymphadenopathy:       Right axillary: No pectoral and no lateral adenopathy present.       Left axillary: No pectoral and no lateral adenopathy present. Skin: Skin is warm and dry.  Pt w/ 1 firm, erythematous, indurated area under R arm and 5 similar areas under L arm w/ well healing I&D site.  L axilla also w/ overlying razor burn  Vitals reviewed.         Assessment & Plan:  Axillary abscess- new.  Pt is having diarrhea on Clinda and the infection is spreading.  Switch to Doxy.  Reviewed wound care.  Discussed the need to stop using his current deodorant and switch to a new stick once his wounds have healed.  Also encouraged him to stop shaving.  Mupirocin for topical use.  Reviewed supportive care and red flags that should prompt return.  Pt expressed understanding and is in agreement w/ plan.

## 2017-10-13 ENCOUNTER — Encounter: Payer: Self-pay | Admitting: Neurology

## 2017-10-13 ENCOUNTER — Encounter: Payer: Self-pay | Admitting: Family Medicine

## 2017-10-13 ENCOUNTER — Ambulatory Visit (INDEPENDENT_AMBULATORY_CARE_PROVIDER_SITE_OTHER): Payer: Medicare HMO | Admitting: Neurology

## 2017-10-13 VITALS — BP 144/80 | HR 78 | Ht 71.0 in | Wt 228.5 lb

## 2017-10-13 DIAGNOSIS — G5620 Lesion of ulnar nerve, unspecified upper limb: Secondary | ICD-10-CM

## 2017-10-13 DIAGNOSIS — G5601 Carpal tunnel syndrome, right upper limb: Secondary | ICD-10-CM | POA: Diagnosis not present

## 2017-10-13 MED ORDER — VALSARTAN 80 MG PO TABS: 80.0000 mg | ORAL_TABLET | Freq: Every day | ORAL | 1 refills | Status: DC

## 2017-10-13 MED ORDER — NORTRIPTYLINE HCL 10 MG PO CAPS: ORAL_CAPSULE | ORAL | 3 refills | Status: DC

## 2017-10-13 NOTE — Progress Notes (Signed)
[facility] Clinic Note - Initial Visit   Date: 10/13/17  JEWELZ KOBUS MRN: [id number] DOB: 09/29/1950   Dear Dr. Birdie Riddle:  Thank you for your kind referral of Dylan [name] for consultation of bilateral hand paresthesias. Although his history is well known to you, please allow [place] to reiterate it for the purpose of our medical record. The patient was accompanied to the clinic by self.    History of Present Illness: Dylan [name] is a 67 y.o. right-handed Caucasian male with hypertension, GERD, hyperlipidemia, and BPH presenting for evaluation of bilateral hand paresthesias.    Starting around 2018, he began having dull and throbbing pain from the elbow into the knuckles.  He is an avid golfer and initially thought symptoms were related to overuse. He had cortisone injection for lateral epicondylitis which has helped some.  Around the same time, he has numbness and tingling of the hands, which is worse at night and often wake him up from sleeping. Tingling is worse on the right hand and involves all of the fingers, on the left it is worse over the last two fingers.   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 He recently saw Dr. Caralyn Guile who administered corticosteroid injection for left CTS which has helped alleviate pain.  He will be undergoing CTS release later this fall.  He has weakness of the hands and difficulty with grip.  He also complains of shooting neck pain and has history of cervical surgery.  He does not have diabetes or thyroid problem.   Out-side paper records, electronic medical record, and images have been reviewed where available and summarized as:  NCS/EMG of the arms 06/19/2017: 1. 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 2. Mild bilateral ulnar neuropathy with slowing across the elbow, purely demyelinating in  type. 3. Chronic C5 radiculopathy affecting the left upper extremity, mild in degree electrically.   MRI lumbar spine wo contrast 09/19/2015: 1. Postoperative sequelae of recent L3-4 and L4-5 wide decompressive laminectomy with Coflex device placement. Overall, there is improved lateral recess stenosis at these levels with improved canal narrowing at L3-4. Central canal stenosis at L4-5 is similar at this time. 2. Question small collection within the posterior aspect of the thecal sac extending from approximately the L1-2 level inferiorly through the L3-4 level, possibly subdural in location. There is mild mass effect on the nerve roots of the cauda equina which are displaced anteriorly at these levels. 3. 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 4. Moderate dilatation of the partially visualized bladder. 5. Mild dilatation of the intra abdominal aorta up to 2.7 cm. Ectatic abdominal aorta at risk for aneurysm development. Recommend followup by ultrasound in 5 years.   MRI brain wwo contrast 02/05/2015: 1. Negative IAC imaging. 2. Normal for age MRI appearance of the brain. 3. Mild to moderate paranasal sinus inflammation.  MRI cervical spine 08/17/2011: Broad-based central disc protrusion at C3-4 with mild spinal stenosis. Moderate stenosis at C4-5 due to spondylosis and disc protrusion.  This is more prominent on the right than the left.  Lab Results  Component Value Date   TSH 1.72 08/20/2017   Lab Results  Component Value Date   EXHBZJIR67 893 06/16/2017   Lab Results  Component Value Date   HGBA1C 6.4 06/16/2017     Past Medical History:  Diagnosis Date  . Allergy    allergic rhinitis  . Anxiety    xanax as needed   . Arthritis    R knee, hands , back   . Cancer ([place])    melonoma- on face, treated with excision   . Complication of anesthesia    trouble waking up with succycholine, does not have  enzyme to break down succycholine  . Diabetes mellitus    was prediabetic  . Dizziness    MRI brain unremarkable 02/05/2015.  Marland Kitchen GERD (gastroesophageal reflux disease)   . Headache(784.0)   . Hepatitis age 42   history of infections Hepatitis type a  . History of melanoma 15 yrs ago   face and forehead-- previously followed by Derm in [place]  . History of ulcerative colitis    non recent flares  . Hyperlipidemia   . Hypertension   . Sleep apnea    last sleep study 10 yrs ago,CPAP- intermittent use, not every night      Past Surgical History:  Procedure Laterality Date  . acl and mensicus repair reconstruct Right 1980's  . ANTERIOR CERVICAL DECOMP/DISCECTOMY FUSION  10/15/2011   Procedure: ANTERIOR CERVICAL DECOMPRESSION/DISCECTOMY FUSION 1 LEVEL;  Surgeon: [name], MD;  Location: [facility];  Service: Neurosurgery;  Laterality: N/A;  Cervical four-five Anterior cervical decompression/diskectomy/fusion  . APPENDECTOMY  1969  . COLONOSCOPY    . HERNIA REPAIR     inguinal / abdominal - repaired as an infant   . JOINT REPLACEMENT    . KNEE ARTHROSCOPY Left last done oct 2013   x 3  . KNEE SURGERY  yrs ago   right knee  . LUMBAR LAMINECTOMY WITH COFLEX 2 LEVEL N/A 09/12/2015   Procedure: L3-4 L4-5 Laminectomy with coflex;  Surgeon: [name], MD;  Location: [facility];  Service: Neurosurgery;  Laterality: N/A;  L3-4 L4-5 Laminectomy with coflex  . NASAL SINUS SURGERY     multiple sinus surgery - 1980's   . TONSILLECTOMY  1969  . TOTAL KNEE ARTHROPLASTY Left 10/22/2012   Procedure: TOTAL KNEE ARTHROPLASTY;  Surgeon: [name], MD;  Location: [facility];  Service: Orthopedics;  Laterality: Left;     Medications:  Outpatient Encounter Medications as of 10/13/2017  Medication Sig  . amLODipine (NORVASC) 5 MG tablet Take 1 tablet (5 mg total) by mouth daily.  Marland Kitchen aspirin EC 81 MG tablet Take 1 tablet (81 mg total) by mouth daily.  Marland Kitchen doxycycline (VIBRA-TABS) 100 MG tablet  Take 1 tablet (100 mg total) by mouth 2 (two) times daily.  Marland Kitchen dutasteride (AVODART) 0.5 MG capsule TAKE 1 CAPSULE (0.5 MG TOTAL) BY MOUTH DAILY.  Marland Kitchen esomeprazole (NEXIUM) 40 MG capsule TAKE ONE CAPSULE BY MOUTH EVERY DAY BEFORE BREAKFAST  . HYDROcodone-acetaminophen (NORCO) 10-325 MG tablet Take 1 tablet by mouth every 6 (six) hours as needed for severe pain.  Marland Kitchen irbesartan (AVAPRO) 75 MG tablet TAKE 1 TABLET BY MOUTH EVERY DAY  . Melatonin 5 MG TABS Take 1 tablet by mouth at bedtime.   . mupirocin ointment (BACTROBAN) 2 % Apply 1 application topically 2 (two) times daily.  . naloxone (NARCAN) nasal spray 4 mg/0.1 mL Use as directed  . rosuvastatin (CRESTOR) 10 MG tablet TAKE 1 TABLET BY MOUTH EVERY DAY  . tamsulosin (FLOMAX) 0.4 MG CAPS capsule Take 0.4 mg by mouth.  . zolpidem (AMBIEN) 10 MG tablet TAKE 1 TABLET BY MOUTH EVERY DAY AT BEDTIME AS NEEDED  . nortriptyline (PAMELOR) 10 MG  capsule Start nortriptyline 10mg  at bedtime for 2 week, then increase to 2 tablet at bedtime   No facility-administered encounter medications on file as of 10/13/2017.      Allergies:  Allergies  Allergen Reactions  . Heparin Anaphylaxis    REACTION: breathing problems  . Succinylcholine Chloride Other (See Comments)    REACTION: does not wake up from anesthetic, pt. Reports that through testing that he was found  not to have the enzyme to breakdown the Succ.  . Vancomycin Hives and Other (See Comments)    Ran off a pump. So developed redman's . Running too fast  . Penicillins Rash    Family History: Family History  Problem Relation Age of Onset  . Cancer Brother        brain tumor  . Ovarian cancer Mother   . Diabetes Father   . Colon cancer Neg Hx   . Stomach cancer Neg Hx   . Rectal cancer Neg Hx   . Esophageal cancer Neg Hx   . Liver cancer Neg Hx     Social History: Social History   Tobacco Use  . Smoking status: Never Smoker  . Smokeless tobacco: Former Systems developer    Types: Chew   Substance Use Topics  . Alcohol use: No  . Drug use: No   Social History   Social History Narrative   Wife works at cath lab at [employer] advanced breast cancer   Lives with wife in a 2 story home.  Retired.  Education: college.     Review of Systems:  CONSTITUTIONAL: No fevers, chills, night sweats, or weight loss.   EYES: No visual changes or eye pain ENT: No hearing changes.  No history of nose bleeds.   RESPIRATORY: No cough, wheezing and shortness of breath.   CARDIOVASCULAR: Negative for chest pain, and palpitations.   GI: Negative for abdominal discomfort, blood in stools or black stools.  No recent change in bowel habits.   GU:  No history of incontinence.   MUSCLOSKELETAL: No history of joint pain or swelling.  No myalgias.   SKIN: Negative for lesions, rash, and itching.   HEMATOLOGY/ONCOLOGY: Negative for prolonged bleeding, bruising easily, and swollen nodes.  No history of cancer.   ENDOCRINE: Negative for cold or heat intolerance, polydipsia or goiter.   PSYCH:  No depression or anxiety symptoms.   NEURO: As Above.   Vital Signs:  BP (!) 144/80   Pulse 78   Ht 5\' 11"  (1.803 m)   Wt 228 lb 8 oz (103.6 kg)   SpO2 98%   BMI 31.87 kg/m    General Medical Exam:   General:  Well appearing, comfortable.   Eyes/ENT: see cranial nerve examination.   Neck: No masses appreciated.  Full range of motion without tenderness.  No carotid bruits. Respiratory:  Clear to auscultation, good air entry bilaterally.   Cardiac:  Regular rate and rhythm, no murmur.   Extremities:  No deformities, edema, or skin discoloration.  Skin:  No rashes or lesions.  Neurological Exam: MENTAL STATUS including orientation to time, place, person, recent and remote memory, attention span and concentration, language, and fund of knowledge is normal.  Speech is not dysarthric.  CRANIAL NERVES: II:  No visual field defects.  Unremarkable fundi.   III-IV-VI: Pupils equal round and reactive to  light.  Normal conjugate, extra-ocular eye movements in all directions of gaze.  No nystagmus.  No ptosis.   V:  Normal facial sensation.   VII:  Normal facial symmetry and movements.    VIII:  Normal hearing and vestibular function.   IX-X:  Normal palatal movement.   XI:  Normal shoulder shrug and head rotation.   XII:  Normal tongue strength and range of motion, no deviation or fasciculation.  MOTOR:  No atrophy, fasciculations or abnormal movements.  No pronator drift.  Tone is normal.    Right Upper Extremity:    Left Upper Extremity:    Deltoid  5/5   Deltoid  5/5   Biceps  5/5   Biceps  5/5   Triceps  5/5   Triceps  5/5   Wrist extensors  5/5   Wrist extensors  5/5   Wrist flexors  5/5   Wrist flexors  5/5   Finger extensors  5/5   Finger extensors  5/5   Finger flexors  5/5   Finger flexors  5/5   Dorsal interossei  5-/5   Dorsal interossei  5-/5   Abductor pollicis  5/5   Abductor pollicis  5/5   Tone (Ashworth scale)  0  Tone (Ashworth scale)  0   Right Lower Extremity:    Left Lower Extremity:    Hip flexors  5/5   Hip flexors  5/5   Hip extensors  5/5   Hip extensors  5/5   Knee flexors  5/5   Knee flexors  5/5   Knee extensors  5/5   Knee extensors  5/5   Dorsiflexors  5/5   Dorsiflexors  5/5   Plantarflexors  5/5   Plantarflexors  5/5   Toe extensors  5/5   Toe extensors  5/5   Toe flexors  5/5   Toe flexors  5/5   Tone (Ashworth scale)  0  Tone (Ashworth scale)  0   MSRs:  Right                                                                 Left brachioradialis 2+  brachioradialis 2+  biceps 2+  biceps 2+  triceps 2+  triceps 2+  patellar 2+  patellar 2+  ankle jerk 2+  ankle jerk 2+  Hoffman no  Hoffman no  plantar response down  plantar response down   SENSORY:  Normal and symmetric perception of light touch, pinprick, vibration, and proprioception.  Romberg's sign absent.  Tinel's positive at the cubital tunnel bilaterally, worse on the  right.  COORDINATION/GAIT: Normal finger-to- nose-finger.  Intact rapid alternating movements bilaterally.  Gait narrow based and stable.  Stressed gait intact, mild unsteadiness with tandem gait.   IMPRESSION: Bilateral cubital tunnel syndrome, mild.  Ulnar neuropathy is purely demyelinating and mild on EDX.    - Start using soft elbow pad to minimize repetitive injury to the ulnar nerve at the elbow  - Minimize hyperflexion of the elbow  - For pain, start nortriptyline 10mg  at bedtime for 2 week, then increase to 2 tablet at bedtime  - Start occupational therapy  Right carpal tunnel syndrome (severe), followed by Dr. Caralyn Guile with plans for CTS release later this fall  Return to clinic in 4 months.   Thank you for allowing me to participate in patient's care.  If I can answer any additional questions, I would be pleased  to do so.    Sincerely,    Mindee Robledo K. Posey Pronto, DO

## 2017-10-13 NOTE — Patient Instructions (Addendum)
Start nortriptyline 10mg  at bedtime for 2 week, then increase to 2 tablet at bedtime  Start using a soft elbow pad. During the day, use the foam padding to prevent compression of the nerve at the inner elbow.  At night, apply padding to the inner elbow to prevent over flexion  Refer to occupational therapy for ulnar neuropathy  Return to clinic in 4 months   Cubital Tunnel Syndrome / Ulnar neuropathy Cubital tunnel syndrome is a condition that causes pain and weakness of the forearm and hand. This condition happens when one of the nerves (ulnar nerve) that runs alongside the elbow joint becomes irritated. What are the causes? Causes of this condition include:  Increased pressure on the ulnar nerve at the elbow, arm, or forearm. This can be caused by: ? Swollen tissues. ? Ligaments. ? Muscles. ? Poorly healed elbow fractures. ? Tumors in the elbow. These are usually noncancerous (benign). ? Scar tissue that develops in the elbow after an injury. ? Bony growths (spurs) near the ulnar nerve.  Stretching of the nerve due to loose elbow ligaments.  Trauma to the nerve at the elbow.  Repetitive elbow bending.  Certain medical conditions.  What increases the risk? This condition is more likely to develop in:  People who do manual labor that requires frequently bending the elbow.  People who play sports that include repeated or strenuous throwing motions, such as baseball.  People who play contact sports, such as football or lacrosse.  People who do not warm up properly before activities.  People who have diabetes.  People who have an underactive thyroid (hypothyroidism).  What are the signs or symptoms? Symptoms of this condition include:  Clumsiness or weakness of the hand.  Tenderness of the inner elbow.  Aching or soreness of the inner elbow, forearm, or fingers, especially the little finger or the ring finger.  Increased pain with forced elbow bending.  Reduced  control when throwing.  Tingling, numbness, or burning inside the forearm, or in part of the hand or fingers, especially the little finger or the ring finger.  Sharp pains that shoot from the elbow down to the wrist and hand.  The inability to grip or pinch hard.  How is this diagnosed? This condition is diagnosed with a medical history and physical exam. Your health care provider will ask about your symptoms and ask for details about any injury. You may also have other tests, including:  Electromyogram (EMG). This test checks how well the nerve is working.  X-ray.  How is this treated? Treatment starts by stopping the activities that are causing your symptoms to get worse. Treatment may include the use of icing and medicines to reduce pain and swelling. You may also be advised to wear a splint to prevent your elbow from bending or wear an elbow pad where the ulnar nerve is closest to the skin. In less severe cases, treatment may also include working with a physical therapist:  To help decrease your symptoms.  To improve the strength and range of motion of your elbow, forearm, and hand.  If the treatments described above do not help, surgery may be needed. Follow these instructions at home: If you have a splint:  Wear it as told by your health care provider. Remove it only as told by your health care provider.  Loosen the splint if your fingers become numb and tingle, or if they turn cold and blue.  Keep the splint clean and dry. Managing pain, stiffness,  and swelling  If directed, apply ice to the injured area: ? Put ice in a plastic bag. ? Place a towel between your skin and the bag. ? Leave the ice on for 20 minutes, 2-3 times per day.  Move your fingers often to avoid stiffness and to lessen swelling.  Raise (elevate) the injured area above the level of your heart while you are sitting or lying down. General instructions  Take over-the-counter and prescription medicines  only as told by your health care provider.  Keep all follow-up visits as told by your health care provider. This is important.  Do any exercise or physical therapy as told by your health care provider.  Do not drive or operate heavy machinery while taking prescription pain medicine.  If you were given an elbow pad, wear it as told by your health care provider. Contact a health care provider if:  Your symptoms get worse.  Your symptoms do not get better with treatment.  Your have new pain.  Your hand on the injured side feels numb or cold. This information is not intended to replace advice given to you by your health care provider. Make sure you discuss any questions you have with your health care provider. Document Released: 02/11/2005 Document Revised: 07/20/2015 Document Reviewed: 04/20/2014 Elsevier Interactive Patient Education  [employer].

## 2017-10-19 NOTE — Assessment & Plan Note (Signed)
Hydrocodone refilled.  

## 2017-10-20 DIAGNOSIS — R300 Dysuria: Secondary | ICD-10-CM | POA: Diagnosis not present

## 2017-10-20 DIAGNOSIS — R3912 Poor urinary stream: Secondary | ICD-10-CM | POA: Diagnosis not present

## 2017-10-20 DIAGNOSIS — N401 Enlarged prostate with lower urinary tract symptoms: Secondary | ICD-10-CM | POA: Diagnosis not present

## 2017-10-30 ENCOUNTER — Encounter: Payer: Self-pay | Admitting: Family Medicine

## 2017-10-30 DIAGNOSIS — M159 Polyosteoarthritis, unspecified: Secondary | ICD-10-CM

## 2017-10-30 DIAGNOSIS — M150 Primary generalized (osteo)arthritis: Principal | ICD-10-CM

## 2017-10-30 DIAGNOSIS — M5412 Radiculopathy, cervical region: Secondary | ICD-10-CM

## 2017-10-30 DIAGNOSIS — M4692 Unspecified inflammatory spondylopathy, cervical region: Secondary | ICD-10-CM

## 2017-10-30 NOTE — Telephone Encounter (Signed)
Last OV 10/10/17 Hydrocodone last filled 10/10/17 #120 with 0

## 2017-10-31 ENCOUNTER — Encounter: Payer: Self-pay | Admitting: Family Medicine

## 2017-10-31 NOTE — Telephone Encounter (Signed)
120 pills of Hydrocodone is enough for 30 days.  This should not be out until 9/15.  I am not filling this medication early as it appears he is not taking this correctly and overusing it.

## 2017-11-03 DIAGNOSIS — M7711 Lateral epicondylitis, right elbow: Secondary | ICD-10-CM | POA: Diagnosis not present

## 2017-11-03 DIAGNOSIS — G5601 Carpal tunnel syndrome, right upper limb: Secondary | ICD-10-CM | POA: Diagnosis not present

## 2017-11-06 ENCOUNTER — Encounter: Payer: Self-pay | Admitting: Family Medicine

## 2017-11-06 DIAGNOSIS — M4692 Unspecified inflammatory spondylopathy, cervical region: Secondary | ICD-10-CM

## 2017-11-06 DIAGNOSIS — M150 Primary generalized (osteo)arthritis: Principal | ICD-10-CM

## 2017-11-06 DIAGNOSIS — M5412 Radiculopathy, cervical region: Secondary | ICD-10-CM

## 2017-11-06 DIAGNOSIS — M159 Polyosteoarthritis, unspecified: Secondary | ICD-10-CM

## 2017-11-07 ENCOUNTER — Other Ambulatory Visit: Payer: Self-pay | Admitting: Internal Medicine

## 2017-11-07 ENCOUNTER — Other Ambulatory Visit: Payer: Self-pay | Admitting: Family Medicine

## 2017-11-07 DIAGNOSIS — E785 Hyperlipidemia, unspecified: Secondary | ICD-10-CM

## 2017-11-07 DIAGNOSIS — M5412 Radiculopathy, cervical region: Secondary | ICD-10-CM

## 2017-11-07 DIAGNOSIS — M150 Primary generalized (osteo)arthritis: Principal | ICD-10-CM

## 2017-11-07 DIAGNOSIS — M159 Polyosteoarthritis, unspecified: Secondary | ICD-10-CM

## 2017-11-07 DIAGNOSIS — M4692 Unspecified inflammatory spondylopathy, cervical region: Secondary | ICD-10-CM

## 2017-11-07 MED ORDER — HYDROCODONE-ACETAMINOPHEN 10-325 MG PO TABS: 1.0000 | ORAL_TABLET | Freq: Four times a day (QID) | ORAL | 0 refills | Status: DC | PRN

## 2017-11-07 NOTE — Telephone Encounter (Signed)
Last OV 10/10/17, No future OV  Last filled 10/10/17, # 120 with 0 refills

## 2017-11-07 NOTE — Telephone Encounter (Signed)
Last OV 10/10/17 Hydrocodone last filled 10/10/17 #120 with 0

## 2017-11-11 ENCOUNTER — Inpatient Hospital Stay (HOSPITAL_BASED_OUTPATIENT_CLINIC_OR_DEPARTMENT_OTHER)
Admission: EM | Admit: 2017-11-11 | Discharge: 2017-11-14 | DRG: 287 | Disposition: A | Discharge status: Home / Self Care | Payer: Medicare Other | Attending: Interventional Cardiology | Admitting: Interventional Cardiology

## 2017-11-11 ENCOUNTER — Emergency Department (HOSPITAL_BASED_OUTPATIENT_CLINIC_OR_DEPARTMENT_OTHER): Payer: Medicare Other

## 2017-11-11 ENCOUNTER — Other Ambulatory Visit: Payer: Self-pay

## 2017-11-11 ENCOUNTER — Encounter (HOSPITAL_BASED_OUTPATIENT_CLINIC_OR_DEPARTMENT_OTHER): Payer: Self-pay

## 2017-11-11 DIAGNOSIS — F419 Anxiety disorder, unspecified: Secondary | ICD-10-CM | POA: Diagnosis present

## 2017-11-11 DIAGNOSIS — I10 Essential (primary) hypertension: Secondary | ICD-10-CM | POA: Diagnosis not present

## 2017-11-11 DIAGNOSIS — R079 Chest pain, unspecified: Secondary | ICD-10-CM | POA: Diagnosis not present

## 2017-11-11 DIAGNOSIS — Z888 Allergy status to other drugs, medicaments and biological substances status: Secondary | ICD-10-CM | POA: Diagnosis not present

## 2017-11-11 DIAGNOSIS — Z881 Allergy status to other antibiotic agents status: Secondary | ICD-10-CM

## 2017-11-11 DIAGNOSIS — Z880 Allergy status to penicillin: Secondary | ICD-10-CM

## 2017-11-11 DIAGNOSIS — Z7982 Long term (current) use of aspirin: Secondary | ICD-10-CM | POA: Diagnosis not present

## 2017-11-11 DIAGNOSIS — M1990 Unspecified osteoarthritis, unspecified site: Secondary | ICD-10-CM | POA: Diagnosis present

## 2017-11-11 DIAGNOSIS — I444 Left anterior fascicular block: Secondary | ICD-10-CM | POA: Diagnosis not present

## 2017-11-11 DIAGNOSIS — Z87891 Personal history of nicotine dependence: Secondary | ICD-10-CM | POA: Diagnosis not present

## 2017-11-11 DIAGNOSIS — K219 Gastro-esophageal reflux disease without esophagitis: Secondary | ICD-10-CM | POA: Diagnosis present

## 2017-11-11 DIAGNOSIS — E119 Type 2 diabetes mellitus without complications: Secondary | ICD-10-CM | POA: Diagnosis not present

## 2017-11-11 DIAGNOSIS — I2510 Atherosclerotic heart disease of native coronary artery without angina pectoris: Secondary | ICD-10-CM | POA: Diagnosis not present

## 2017-11-11 DIAGNOSIS — E785 Hyperlipidemia, unspecified: Secondary | ICD-10-CM | POA: Diagnosis not present

## 2017-11-11 DIAGNOSIS — Z79899 Other long term (current) drug therapy: Secondary | ICD-10-CM | POA: Diagnosis not present

## 2017-11-11 DIAGNOSIS — G4733 Obstructive sleep apnea (adult) (pediatric): Secondary | ICD-10-CM | POA: Diagnosis present

## 2017-11-11 DIAGNOSIS — R9439 Abnormal result of other cardiovascular function study: Secondary | ICD-10-CM

## 2017-11-11 DIAGNOSIS — I249 Acute ischemic heart disease, unspecified: Secondary | ICD-10-CM | POA: Diagnosis not present

## 2017-11-11 DIAGNOSIS — Z833 Family history of diabetes mellitus: Secondary | ICD-10-CM | POA: Diagnosis not present

## 2017-11-11 DIAGNOSIS — R0789 Other chest pain: Secondary | ICD-10-CM | POA: Diagnosis not present

## 2017-11-11 LAB — BASIC METABOLIC PANEL
ANION GAP: 13 (ref 5–15)
BUN: 17 mg/dL (ref 8–23)
CALCIUM: 9.7 mg/dL (ref 8.9–10.3)
CO2: 25 mmol/L (ref 22–32)
Chloride: 100 mmol/L (ref 98–111)
Creatinine, Ser: 1.36 mg/dL — ABNORMAL HIGH (ref 0.61–1.24)
GFR, EST NON AFRICAN AMERICAN: 53 mL/min — AB (ref >60)
Glucose, Bld: 120 mg/dL — ABNORMAL HIGH (ref 70–99)
Potassium: 4.1 mmol/L (ref 3.5–5.1)
Sodium: 138 mmol/L (ref 135–145)

## 2017-11-11 LAB — CBC
HCT: 41.4 % (ref 39.0–52.0)
HEMOGLOBIN: 14.3 g/dL (ref 13.0–17.0)
MCH: 30.5 pg (ref 26.0–34.0)
MCHC: 34.5 g/dL (ref 30.0–36.0)
MCV: 88.3 fL (ref 78.0–100.0)
PLATELETS: 303 10*3/uL (ref 150–400)
RBC: 4.69 MIL/uL (ref 4.22–5.81)
RDW: 12.8 % (ref 11.5–15.5)
WBC: 14.2 10*3/uL — ABNORMAL HIGH (ref 4.0–10.5)

## 2017-11-11 LAB — TROPONIN I
TROPONIN I: 0.11 ng/mL — AB (ref <0.03)
Troponin I: <0.03 ng/mL (ref <0.03)

## 2017-11-11 LAB — URINALYSIS, ROUTINE W REFLEX MICROSCOPIC
Bilirubin Urine: NEGATIVE
Glucose, UA: NEGATIVE mg/dL
Hgb urine dipstick: NEGATIVE
Ketones, ur: NEGATIVE mg/dL
Leukocytes, UA: NEGATIVE
Nitrite: NEGATIVE
Protein, ur: NEGATIVE mg/dL
Specific Gravity, Urine: 1.01 (ref 1.005–1.030)
pH: 6.5 (ref 5.0–8.0)

## 2017-11-11 LAB — I-STAT CG4 LACTIC ACID, ED: Lactic Acid, Venous: 1.33 mmol/L (ref 0.5–1.9)

## 2017-11-11 LAB — CK: Total CK: 126 U/L (ref 49–397)

## 2017-11-11 LAB — GLUCOSE, CAPILLARY: Glucose-Capillary: 104 mg/dL — ABNORMAL HIGH (ref 70–99)

## 2017-11-11 MED ORDER — METOPROLOL TARTRATE 12.5 MG HALF TABLET
12.5000 mg | ORAL_TABLET | Freq: Two times a day (BID) | ORAL | Status: DC
  Administered 2017-11-11 – 2017-11-14 (×6): 12.5 mg via ORAL
  Filled 2017-11-11 (×6): qty 1

## 2017-11-11 MED ORDER — ASPIRIN 81 MG PO CHEW: 324.0000 mg | CHEWABLE_TABLET | ORAL | Status: DC

## 2017-11-11 MED ORDER — NITROGLYCERIN 0.4 MG SL SUBL: 0.4000 mg | SUBLINGUAL_TABLET | SUBLINGUAL | Status: DC | PRN

## 2017-11-11 MED ORDER — HYDROCODONE-ACETAMINOPHEN 5-325 MG PO TABS
1.0000 | ORAL_TABLET | Freq: Four times a day (QID) | ORAL | Status: DC | PRN
  Administered 2017-11-11 – 2017-11-14 (×9): 1 via ORAL
  Filled 2017-11-11 (×9): qty 1

## 2017-11-11 MED ORDER — ACETAMINOPHEN 325 MG PO TABS
650.0000 mg | ORAL_TABLET | ORAL | Status: DC | PRN
  Administered 2017-11-12: 650 mg via ORAL
  Filled 2017-11-11: qty 2

## 2017-11-11 MED ORDER — ZOLPIDEM TARTRATE 5 MG PO TABS
5.0000 mg | ORAL_TABLET | Freq: Every evening | ORAL | Status: DC | PRN
  Administered 2017-11-11 – 2017-11-13 (×3): 5 mg via ORAL
  Filled 2017-11-11 (×3): qty 1

## 2017-11-11 MED ORDER — NITROGLYCERIN 2 % TD OINT
0.5000 [in_us] | TOPICAL_OINTMENT | Freq: Three times a day (TID) | TRANSDERMAL | Status: DC
  Administered 2017-11-11 – 2017-11-12 (×3): 0.5 [in_us] via TOPICAL
  Filled 2017-11-11: qty 30

## 2017-11-11 MED ORDER — ATORVASTATIN CALCIUM 80 MG PO TABS
80.0000 mg | ORAL_TABLET | Freq: Every day | ORAL | Status: DC
  Administered 2017-11-11 – 2017-11-13 (×3): 80 mg via ORAL
  Filled 2017-11-11 (×3): qty 1

## 2017-11-11 MED ORDER — BIVALIRUDIN BOLUS VIA INFUSION
0.1000 mg/kg | Freq: Once | INTRAVENOUS | Status: AC
  Administered 2017-11-11: 10 mg via INTRAVENOUS
  Filled 2017-11-11: qty 10

## 2017-11-11 MED ORDER — ASPIRIN 300 MG RE SUPP: 300.0000 mg | RECTAL | Status: DC

## 2017-11-11 MED ORDER — NORTRIPTYLINE HCL 10 MG PO CAPS
10.0000 mg | ORAL_CAPSULE | Freq: Every day | ORAL | Status: DC
  Filled 2017-11-11 (×3): qty 1

## 2017-11-11 MED ORDER — PANTOPRAZOLE SODIUM 40 MG PO TBEC
40.0000 mg | DELAYED_RELEASE_TABLET | Freq: Every day | ORAL | Status: DC
  Administered 2017-11-12 – 2017-11-14 (×3): 40 mg via ORAL
  Filled 2017-11-11 (×3): qty 1

## 2017-11-11 MED ORDER — SODIUM CHLORIDE 0.9 % IV BOLUS
1000.0000 mL | Freq: Once | INTRAVENOUS | Status: AC
  Administered 2017-11-11: 1000 mL via INTRAVENOUS

## 2017-11-11 MED ORDER — SODIUM CHLORIDE 0.9 % IV SOLN
INTRAVENOUS | Status: DC
  Administered 2017-11-11 – 2017-11-14 (×3): via INTRAVENOUS

## 2017-11-11 MED ORDER — SODIUM CHLORIDE 0.9 % IV SOLN
0.2000 mg/kg/h | INTRAVENOUS | Status: DC
  Administered 2017-11-11 – 2017-11-14 (×6): 0.2 mg/kg/h via INTRAVENOUS
  Filled 2017-11-11 (×7): qty 250

## 2017-11-11 MED ORDER — ONDANSETRON HCL 4 MG/2ML IJ SOLN: 4.0000 mg | Freq: Four times a day (QID) | INTRAMUSCULAR | Status: DC | PRN

## 2017-11-11 MED ORDER — INSULIN ASPART 100 UNIT/ML ~~LOC~~ SOLN: 0.0000 [IU] | Freq: Three times a day (TID) | SUBCUTANEOUS | Status: DC

## 2017-11-11 MED ORDER — DUTASTERIDE 0.5 MG PO CAPS
0.5000 mg | ORAL_CAPSULE | Freq: Every day | ORAL | Status: DC
  Administered 2017-11-12 – 2017-11-14 (×3): 0.5 mg via ORAL
  Filled 2017-11-11 (×3): qty 1

## 2017-11-11 MED ORDER — ASPIRIN EC 81 MG PO TBEC
81.0000 mg | DELAYED_RELEASE_TABLET | Freq: Every day | ORAL | Status: DC
  Administered 2017-11-12: 81 mg via ORAL
  Filled 2017-11-11 (×3): qty 1

## 2017-11-11 MED ORDER — TAMSULOSIN HCL 0.4 MG PO CAPS
0.4000 mg | ORAL_CAPSULE | Freq: Every day | ORAL | Status: DC
  Administered 2017-11-12 – 2017-11-13 (×2): 0.4 mg via ORAL
  Filled 2017-11-11 (×2): qty 1

## 2017-11-11 MED ORDER — ASPIRIN 81 MG PO CHEW
324.0000 mg | CHEWABLE_TABLET | Freq: Once | ORAL | Status: AC
  Administered 2017-11-11: 324 mg via ORAL
  Filled 2017-11-11: qty 4

## 2017-11-11 NOTE — H&P (Signed)
Dylan [name] is an 67 y.o. male.   Chief Complaint:chest pain associated with generalized crampiness in the arms and legs HPI: patient is 67 year old male with past medical history significant for hypertension, hyper lipidemia, sleep apnea, diabetes mellitus controlled by diet, degenerative joint disease, GERD, went to [facility] med center generalized body cramping associated with chest cramping/pressure while playing golf associated with mild shortness of breath and dizziness. 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 Patient denies any cardiac workup in the past. Denies any history of exertional chest pain in the past. Denies any palpitation lightheadedness or syncope.  Past Medical History:  Diagnosis Date  . Allergy    allergic rhinitis  . Anxiety    xanax as needed   . Arthritis    R knee, hands , back   . Cancer ([place])    melonoma- on face, treated with excision   . Complication of anesthesia    trouble waking up with succycholine, does not have enzyme to break down succycholine  . Diabetes mellitus    was prediabetic  . Dizziness    MRI brain unremarkable 02/05/2015.  Marland Kitchen GERD (gastroesophageal reflux disease)   . Headache(784.0)   . Hepatitis age 58   history of infections Hepatitis type a  . History of melanoma 15 yrs ago   face and forehead-- previously followed by Derm in [place]  . History of ulcerative colitis    non recent flares  . Hyperlipidemia   . Hypertension   . Sleep apnea    last sleep study 10 yrs ago,CPAP- intermittent use, not every night      Past Surgical History:  Procedure Laterality Date  . acl and mensicus repair reconstruct Right 1980's  . ANTERIOR CERVICAL DECOMP/DISCECTOMY FUSION  10/15/2011   Procedure: ANTERIOR CERVICAL DECOMPRESSION/DISCECTOMY  FUSION 1 LEVEL;  Surgeon: [name], MD;  Location: [place] [facility];  Service: Neurosurgery;  Laterality: N/A;  Cervical four-five Anterior cervical decompression/diskectomy/fusion  . APPENDECTOMY  1969  . COLONOSCOPY    . HERNIA REPAIR     inguinal / abdominal - repaired as an infant   . JOINT REPLACEMENT    . KNEE ARTHROSCOPY Left last done oct 2013   x 3  . KNEE SURGERY  yrs ago   right knee  . LUMBAR LAMINECTOMY WITH COFLEX 2 LEVEL N/A 09/12/2015   Procedure: L3-4 L4-5 Laminectomy with coflex;  Surgeon: [name], MD;  Location: [place] [facility];  Service: Neurosurgery;  Laterality: N/A;  L3-4 L4-5 Laminectomy with coflex  . NASAL SINUS SURGERY     multiple sinus surgery - 1980's   . TONSILLECTOMY  1969  . TOTAL KNEE ARTHROPLASTY Left 10/22/2012   Procedure: TOTAL KNEE ARTHROPLASTY;  Surgeon: [name], MD;  Location: [facility];  Service: Orthopedics;  Laterality: Left;    Family History  Problem Relation Age of Onset  . Cancer Brother        brain tumor  . Ovarian cancer Mother   . Diabetes Father   . Colon cancer Neg Hx   . Stomach cancer Neg Hx   . Rectal cancer Neg Hx   . Esophageal cancer Neg Hx   . Liver cancer Neg Hx    Social History:  reports that he has never smoked. He quit smokeless tobacco use  about 14 years ago.  His smokeless tobacco use included chew. He reports that he does not drink alcohol or use drugs.  Allergies:  Allergies  Allergen Reactions  . Heparin Anaphylaxis    REACTION: breathing problems  . Succinylcholine Chloride Other (See Comments)    REACTION: does not wake up from anesthetic, pt. Reports that through testing that he was found  not to have the enzyme to breakdown the Succ.  . Vancomycin Hives and Other (See Comments)    Ran off a pump. So developed redman's . Running too fast  . Penicillins Rash    Medications Prior to Admission  Medication Sig Dispense Refill  . doxycycline (VIBRA-TABS) 100 MG tablet Take 1 tablet (100 mg total)  by mouth 2 (two) times daily. 20 tablet 0  . amLODipine (NORVASC) 5 MG tablet Take 1 tablet (5 mg total) by mouth daily. 90 tablet 1  . aspirin EC 81 MG tablet Take 1 tablet (81 mg total) by mouth daily.    Marland Kitchen dutasteride (AVODART) 0.5 MG capsule TAKE 1 CAPSULE (0.5 MG TOTAL) BY MOUTH DAILY. 90 capsule 1  . esomeprazole (NEXIUM) 40 MG capsule TAKE ONE CAPSULE BY MOUTH EVERY DAY BEFORE BREAKFAST 90 capsule 1  . HYDROcodone-acetaminophen (NORCO) 10-325 MG tablet Take 1 tablet by mouth every 6 (six) hours as needed for severe pain. 120 tablet 0  . Melatonin 5 MG TABS Take 1 tablet by mouth at bedtime.     . mupirocin ointment (BACTROBAN) 2 % Apply 1 application topically 2 (two) times daily. 30 g 0  . naloxone (NARCAN) nasal spray 4 mg/0.1 mL Use as directed 2 kit 3  . nortriptyline (PAMELOR) 10 MG capsule Start nortriptyline 78m at bedtime for 2 week, then increase to 2 tablet at bedtime 60 capsule 3  . rosuvastatin (CRESTOR) 10 MG tablet TAKE 1 TABLET BY MOUTH EVERY DAY 90 tablet 1  . tamsulosin (FLOMAX) 0.4 MG CAPS capsule Take 0.4 mg by mouth.    . valsartan (DIOVAN) 80 MG tablet Take 1 tablet (80 mg total) by mouth daily. 30 tablet 1  . zolpidem (AMBIEN) 10 MG tablet TAKE 1 TABLET BY MOUTH EVERY DAY AT BEDTIME AS NEEDED 30 tablet 2    Results for orders placed or performed during the hospital encounter of 11/11/17 (from the past 48 hour(s))  Basic metabolic panel     Status: Abnormal   Collection Time: 11/11/17  4:24 PM  Result Value Ref Range   Sodium 138 135 - 145 mmol/L   Potassium 4.1 3.5 - 5.1 mmol/L   Chloride 100 98 - 111 mmol/L   CO2 25 22 - 32 mmol/L   Glucose, Bld 120 (H) 70 - 99 mg/dL   BUN 17 8 - 23 mg/dL   Creatinine, Ser 1.36 (H) 0.61 - 1.24 mg/dL   Calcium 9.7 8.9 - 10.3 mg/dL   GFR calc non Af Amer 53 (L) >60 mL/min   GFR calc Af Amer >60 >60 mL/min    Comment: (NOTE) The eGFR has been calculated using the CKD EPI equation. This calculation has not been validated in  all clinical situations. eGFR's persistently <60 mL/min signify possible Chronic Kidney Disease.    Anion gap 13 5 - 15    Comment: Performed at [facility] [id number] CBC     Status: Abnormal   Collection Time: 11/11/17  4:24 PM  Result Value Ref Range   WBC 14.2 (H)  4.0 - 10.5 K/uL   RBC 4.69 4.22 - 5.81 MIL/uL   Hemoglobin 14.3 13.0 - 17.0 g/dL   HCT 41.4 39.0 - 52.0 %   MCV 88.3 78.0 - 100.0 fL   MCH 30.5 26.0 - 34.0 pg   MCHC 34.5 30.0 - 36.0 g/dL   RDW 12.8 11.5 - 15.5 %   Platelets 303 150 - 400 K/uL    Comment: Performed at [facility], [address] [place], [place] [address]  Troponin I     Status: Abnormal   Collection Time: 11/11/17  4:24 PM  Result Value Ref Range   Troponin I 0.11 (HH) <0.03 ng/mL    Comment: CRITICAL RESULT CALLED TO, READ BACK BY AND VERIFIED WITH: MARVA SIMMS RN AT 1711 ON 11/11/17 BY I.[email or web address] Performed at [facility], [address] [place], [place] [address]   CK     Status: None   Collection Time: 11/11/17  4:24 PM  Result Value Ref Range   Total CK 126 49 - 397 U/L    Comment: Performed at [facility], [address] [place], [place] [address]  I-Stat CG4 Lactic Acid, ED     Status: None   Collection Time: 11/11/17  4:42 PM  Result Value Ref Range   Lactic Acid, Venous 1.33 0.5 - 1.9 mmol/L  Urinalysis, Routine w reflex microscopic     Status: None   Collection Time: 11/11/17  5:21 PM  Result Value Ref Range   Color, Urine YELLOW YELLOW   APPearance CLEAR CLEAR   Specific Gravity, Urine 1.010 1.005 - 1.030   pH 6.5 5.0 - 8.0   Glucose, UA NEGATIVE NEGATIVE mg/dL   Hgb urine dipstick NEGATIVE NEGATIVE   Bilirubin Urine NEGATIVE NEGATIVE   Ketones, ur NEGATIVE NEGATIVE mg/dL   Protein, ur NEGATIVE NEGATIVE mg/dL   Nitrite NEGATIVE NEGATIVE   Leukocytes, UA NEGATIVE NEGATIVE    Comment: Microscopic not done on urines with negative protein, blood,  leukocytes, nitrite, or glucose < 500 mg/dL. Performed at [facility], [address], [place], [place] [address]    Dg Chest 2 View  Result Date: 11/11/2017 CLINICAL DATA:  Chest pain. EXAM: CHEST - 2 VIEW COMPARISON:  04/16/2016 FINDINGS: The heart size and mediastinal contours are within normal limits. Both lungs are clear. The visualized skeletal structures are unremarkable. IMPRESSION: No active cardiopulmonary disease. Electronically Signed   By: Earle Gell M.D.   On: 11/11/2017 18:38    Review of Systems  Constitutional: Negative for chills and fever.  HENT: Negative for hearing loss.   Eyes: Negative for blurred vision.  Respiratory: Positive for shortness of breath. Negative for cough.   Cardiovascular: Positive for chest pain. Negative for palpitations, orthopnea and leg swelling.  Gastrointestinal: Negative for abdominal pain, nausea and vomiting.  Genitourinary: Negative for dysuria.  Neurological: Negative for dizziness.    Blood pressure 140/83, pulse 74, temperature 98.2 F (36.8 C), temperature source Oral, resp. rate 18, height _0  (1.803 m), weight 99.7 kg, SpO2 100 %. Physical Exam  Constitutional: He is oriented to person, place, and time.  HENT:  Head: Normocephalic and atraumatic.  Eyes: Pupils are equal, round, and reactive to light. Conjunctivae are normal. Left eye exhibits no discharge. No scleral icterus.  Neck: Normal range of motion. Neck supple. No JVD present. No tracheal deviation present.  Cardiovascular: Normal rate and regular rhythm. Exam reveals no gallop and no  friction rub.  No murmur heard. Respiratory: Effort normal and breath sounds normal. No respiratory distress. He has no wheezes. He has no rales.  GI: Soft. Bowel sounds are normal. He exhibits no distension. There is no tenderness. There is no rebound and no guarding.  Musculoskeletal: He exhibits no edema, tenderness or deformity.  Neurological: He is alert and oriented  to person, place, and time.      Assessment/Plan Acute coronary syndrome Hypertension Hyperlipidemia Diabetes mellitus controlled by diet GERD Degenerative joint disease Obstructive sleep apnea Plan Check serial enzymes[panel and EKG Start aspirin beta blockers nitrates and Lovenox Schedule for Lexiscan Myoview in a.m. If cardiac enzymes are trending down Discussed with patient and agrees with the above Charolette Forward, MD 11/11/2017, 9:00 PM

## 2017-11-11 NOTE — ED Triage Notes (Signed)
Pt c/o body cramps started ~130pm while outside playing golf today-NAD-to triage in w/c

## 2017-11-11 NOTE — ED Notes (Signed)
ED Provider at bedside. 

## 2017-11-11 NOTE — Plan of Care (Signed)
  Problem: Education: Goal: Knowledge of General Education information will improve Description Including pain rating scale, medication(s)/side effects and non-pharmacologic comfort measures Outcome: Completed/Met   Problem: Clinical Measurements: Goal: Respiratory complications will improve Outcome: Completed/Met

## 2017-11-11 NOTE — ED Notes (Signed)
Pt left facility via [facility].

## 2017-11-11 NOTE — Progress Notes (Signed)
[place] for bivalirudin Indication: chest pain/ACS  Allergies  Allergen Reactions  . Heparin Anaphylaxis    REACTION: breathing problems  . Succinylcholine Chloride Other (See Comments)    REACTION: does not wake up from anesthetic, pt. Reports that through testing that he was found  not to have the enzyme to breakdown the Succ.  . Vancomycin Hives and Other (See Comments)    Ran off a pump. So developed redman's . Running too fast  . Penicillins Rash    Patient Measurements: Height: 5' 11"  (180.3 cm) Weight: 219 lb 14.4 oz (99.7 kg) IBW/kg (Calculated) : 75.3 Heparin Dosing Weight: 96kg  Vital Signs: Temp: 98.2 F (36.8 C) (09/17 2039) Temp Source: Oral (09/17 2039) BP: 140/83 (09/17 2039) Pulse Rate: 74 (09/17 2039)  Labs: Recent Labs    11/11/17 1624  HGB 14.3  HCT 41.4  PLT 303  CREATININE 1.36*  CKTOTAL 126  TROPONINI 0.11*    Estimated Creatinine Clearance: 65.2 mL/min (A) (by C-G formula based on SCr of 1.36 mg/dL (H)).   Medical History: Past Medical History:  Diagnosis Date  . Allergy    allergic rhinitis  . Anxiety    xanax as needed   . Arthritis    R knee, hands , back   . Cancer ([place])    melonoma- on face, treated with excision   . Complication of anesthesia    trouble waking up with succycholine, does not have enzyme to break down succycholine  . Diabetes mellitus    was prediabetic  . Dizziness    MRI brain unremarkable 02/05/2015.  Marland Kitchen GERD (gastroesophageal reflux disease)   . Headache(784.0)   . Hepatitis age 9   history of infections Hepatitis type a  . History of melanoma 15 yrs ago   face and forehead-- previously followed by Derm in [place]  . History of ulcerative colitis    non recent flares  . Hyperlipidemia   . Hypertension   . Sleep apnea    last sleep study 10 yrs ago,CPAP- intermittent use, not every night      Medications:  Medications Prior to Admission  Medication Sig  Dispense Refill Last Dose  . doxycycline (VIBRA-TABS) 100 MG tablet Take 1 tablet (100 mg total) by mouth 2 (two) times daily. 20 tablet 0 Taking  . amLODipine (NORVASC) 5 MG tablet Take 1 tablet (5 mg total) by mouth daily. 90 tablet 1 Taking  . aspirin EC 81 MG tablet Take 1 tablet (81 mg total) by mouth daily.   Taking  . dutasteride (AVODART) 0.5 MG capsule TAKE 1 CAPSULE (0.5 MG TOTAL) BY MOUTH DAILY. 90 capsule 1 Taking  . esomeprazole (NEXIUM) 40 MG capsule TAKE ONE CAPSULE BY MOUTH EVERY DAY BEFORE BREAKFAST 90 capsule 1 Taking  . HYDROcodone-acetaminophen (NORCO) 10-325 MG tablet Take 1 tablet by mouth every 6 (six) hours as needed for severe pain. 120 tablet 0   . Melatonin 5 MG TABS Take 1 tablet by mouth at bedtime.    Taking  . mupirocin ointment (BACTROBAN) 2 % Apply 1 application topically 2 (two) times daily. 30 g 0 Taking  . naloxone (NARCAN) nasal spray 4 mg/0.1 mL Use as directed 2 kit 3 Taking  . nortriptyline (PAMELOR) 10 MG capsule Start nortriptyline 19m at bedtime for 2 week, then increase to 2 tablet at bedtime 60 capsule 3   . rosuvastatin (CRESTOR) 10 MG tablet TAKE 1 TABLET BY MOUTH EVERY DAY 90 tablet 1  Taking  . tamsulosin (FLOMAX) 0.4 MG CAPS capsule Take 0.4 mg by mouth.   Taking  . valsartan (DIOVAN) 80 MG tablet Take 1 tablet (80 mg total) by mouth daily. 30 tablet 1   . zolpidem (AMBIEN) 10 MG tablet TAKE 1 TABLET BY MOUTH EVERY DAY AT BEDTIME AS NEEDED 30 tablet 2 Taking    Assessment: 67 yo male here with CP to start  (patient noted with heparin allergy; he describes significant breathing problems with heparin use in the past). He has no prior experiences with enoxaparin. His only other anticoagulant use was Xarelto in 2014 for a TKA. Spoke with Dr. Terrence Dupont and will use bivalirudin.  Goal of Therapy:  aPTT 50-85 seconds Monitor platelets by anticoagulation protocol: Yes   Plan:  -Bivalirudin 0.70m/kg bolus followed by 0.269mkg/hr (lower dosing as  therapeutic aPTTs are often achieved on doses < 0.25101mg/hr) -aPTT in 2 hours and daily with CBC daily  AndHildred LaserharmD Clinical Pharmacist Please check Amion for pharmacy contact number

## 2017-11-11 NOTE — ED Provider Notes (Signed)
[place] EMERGENCY DEPARTMENT Provider Note   CSN: [id number] Arrival date & time: 11/11/17  1559     History   Chief Complaint Chief Complaint  Patient presents with  . Body cramps    Dylan [name] is a 67 y.o. male.  Dylan Patient presents to the emergency department with body cramping and some chest discomfort that started while playing golf today.  The patient states that he was walking playing golf and noted that the heat started to affect him and started having generalized body cramping and weakness.  The patient states that he also started having some chest discomfort and pressure.  Patient states that nothing seemed to make the condition better but any movements or activities seem to make his condition worse.  Patient states he did not take any medications prior to arrival but did attempt to drink fluids without relief of his symptoms.  The patient denies shortness of breath, headache,blurred vision, neck pain, fever, cough, weakness, numbness, dizziness, anorexia, edema, abdominal pain, nausea, vomiting, diarrhea, rash, back pain, dysuria, hematemesis, bloody stool, near syncope, or syncope. Past Medical History:  Diagnosis Date  . Allergy    allergic rhinitis  . Anxiety    xanax as needed   . Arthritis    R knee, hands , back   . Cancer ([place])    melonoma- on face, treated with excision   . Complication of anesthesia    trouble waking up with succycholine, does not have enzyme to break down succycholine  . Diabetes mellitus    was prediabetic  . Dizziness    MRI brain unremarkable 02/05/2015.  Marland Kitchen GERD (gastroesophageal reflux disease)   . Headache(784.0)   . Hepatitis age 40   history of infections Hepatitis type a  . History of melanoma 15 yrs ago   face and forehead-- previously followed by Derm in [place]  . History of ulcerative colitis    non recent flares  . Hyperlipidemia   . Hypertension   . Sleep apnea    last sleep study 10 yrs  ago,CPAP- intermittent use, not every night      Patient Active Problem List   Diagnosis Date Noted  . Obesity (BMI 30.0-34.9) 08/20/2017  . Difficult or painful urination 08/20/2017  . Cervical radiculopathy 08/14/2017  . Chronic use of opiate for therapeutic purpose 07/22/2017  . Ulnar neuropathy of both upper extremities 06/19/2017  . Numbness and tingling in both hands 06/16/2017  . Encounter for long-term opiate analgesic use 06/16/2017  . 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 Routine general medical examination at a health care facility 11/11/2016  . Oropharyngeal dysphagia 03/05/2016  . Lumbar stenosis with neurogenic claudication 09/12/2015  . Hypertension 05/10/2011  . BPH (benign prostatic hyperplasia) 08/18/2010  . OA (osteoarthritis) 11/15/2009  . CARPAL TUNNEL SYNDROME 08/07/2009  . SLEEP APNEA, OBSTRUCTIVE, MODERATE 03/22/2009  . Insomnia w/ sleep apnea 03/22/2009  . Hx of malignant melanoma 11/17/2008  . Hyperlipidemia LDL goal <100 11/17/2008  . ALLERGIC RHINITIS 11/17/2008    Past Surgical History:  Procedure Laterality Date  . acl and mensicus repair reconstruct Right 1980's  . ANTERIOR CERVICAL DECOMP/DISCECTOMY FUSION  10/15/2011   Procedure: ANTERIOR CERVICAL DECOMPRESSION/DISCECTOMY FUSION 1 LEVEL;  Surgeon: [name], MD;  Location: [facility];  Service: Neurosurgery;  Laterality: N/A;  Cervical four-five Anterior cervical decompression/diskectomy/fusion  . APPENDECTOMY  1969  . COLONOSCOPY    . HERNIA REPAIR     inguinal / abdominal - repaired  as an infant   . JOINT REPLACEMENT    . KNEE ARTHROSCOPY Left last done oct 2013   x 3  . KNEE SURGERY  yrs ago   right knee  . LUMBAR LAMINECTOMY WITH COFLEX 2 LEVEL N/A 09/12/2015   Procedure: L3-4 L4-5 Laminectomy with coflex;  Surgeon: [name], MD;  Location: [facility];  Service: Neurosurgery;  Laterality: N/A;  L3-4 L4-5 Laminectomy with coflex  . NASAL SINUS SURGERY      multiple sinus surgery - 1980's   . TONSILLECTOMY  1969  . TOTAL KNEE ARTHROPLASTY Left 10/22/2012   Procedure: TOTAL KNEE ARTHROPLASTY;  Surgeon: [name], MD;  Location: [facility];  Service: Orthopedics;  Laterality: Left;        Home Medications    Prior to Admission medications   Medication Sig Start Date End Date Taking? Authorizing Provider  doxycycline (VIBRA-TABS) 100 MG tablet Take 1 tablet (100 mg total) by mouth 2 (two) times daily. 10/10/17  Yes [name], MD  amLODipine (NORVASC) 5 MG tablet Take 1 tablet (5 mg total) by mouth daily. 07/17/17   [name], MD  aspirin EC 81 MG tablet Take 1 tablet (81 mg total) by mouth daily. 10/24/12   [name], [name]  dutasteride (AVODART) 0.5 MG capsule TAKE 1 CAPSULE (0.5 MG TOTAL) BY MOUTH DAILY. 06/18/17   [name], MD  esomeprazole (NEXIUM) 40 MG capsule TAKE ONE CAPSULE BY MOUTH EVERY DAY BEFORE BREAKFAST 05/19/17   [name], MD  HYDROcodone-acetaminophen (NORCO) 10-325 MG tablet Take 1 tablet by mouth every 6 (six) hours as needed for severe pain. 11/07/17   [name], MD  Melatonin 5 MG TABS Take 1 tablet by mouth at bedtime.     [provider]  mupirocin ointment (BACTROBAN) 2 % Apply 1 application topically 2 (two) times daily. 10/10/17   [name], MD  naloxone [facility]) nasal spray 4 mg/0.1 mL Use as directed 07/22/17   [name], MD  nortriptyline (PAMELOR) 10 MG capsule Start nortriptyline 10mg  at bedtime for 2 week, then increase to 2 tablet at bedtime 10/13/17   Narda Amber K, DO  rosuvastatin (CRESTOR) 10 MG tablet TAKE 1 TABLET BY MOUTH EVERY DAY 05/11/17   [name], MD  tamsulosin (FLOMAX) 0.4 MG CAPS capsule Take 0.4 mg by mouth.    [provider]  valsartan (DIOVAN) 80 MG tablet Take 1 tablet (80 mg total) by mouth daily. 10/13/17   [name], MD  zolpidem (AMBIEN) 10 MG tablet TAKE 1 TABLET BY MOUTH EVERY DAY AT BEDTIME AS NEEDED  09/10/17   [name], MD    Family History Family History  Problem Relation Age of Onset  . Cancer Brother        brain tumor  . Ovarian cancer Mother   . Diabetes Father   . Colon cancer Neg Hx   . Stomach cancer Neg Hx   . Rectal cancer Neg Hx   . Esophageal cancer Neg Hx   . Liver cancer Neg Hx     Social History Social History   Tobacco Use  . Smoking status: Never Smoker  . Smokeless tobacco: Former Systems developer    Types: Chew  Substance Use Topics  . Alcohol use: No  . Drug use: No     Allergies   Heparin; Succinylcholine chloride; Vancomycin; and Penicillins   Review of Systems Review of Systems All other systems negative  except as documented in the Dylan. All pertinent positives and negatives as reviewed in the Dylan.  Physical Exam Updated Vital Signs BP 132/90 (BP Location: Right Arm)   Pulse 89   Temp 98.6 F (37 C) (Oral)   Resp 20   Ht 5\' 11"  (1.803 m)   Wt 104.3 kg   SpO2 100%   BMI 32.08 kg/m   Physical Exam  Constitutional: He is oriented to person, place, and time. He appears well-developed and well-nourished. No distress.  HENT:  Head: Normocephalic and atraumatic.  Mouth/Throat: Oropharynx is clear and moist.  Eyes: Pupils are equal, round, and reactive to light.  Neck: Normal range of motion. Neck supple.  Cardiovascular: Normal rate, regular rhythm and normal heart sounds. Exam reveals no gallop and no friction rub.  No murmur heard. Pulmonary/Chest: Effort normal and breath sounds normal. No respiratory distress. He has no wheezes.  Abdominal: Soft. Bowel sounds are normal. He exhibits no distension. There is no tenderness.  Neurological: He is alert and oriented to person, place, and time. He exhibits normal muscle tone. Coordination normal.  Skin: Skin is warm and dry. Capillary refill takes less than 2 seconds. No rash noted. No erythema.  Psychiatric: He has a normal mood and affect. His behavior is normal.  Nursing note and  vitals reviewed.    ED Treatments / Results  Labs (all labs ordered are listed, but only abnormal results are displayed) Labs Reviewed  BASIC METABOLIC PANEL - Abnormal; Notable for the following components:      Result Value   Glucose, Bld 120 (*)    Creatinine, Ser 1.36 (*)    GFR calc non Af Amer 53 (*)    All other components within normal limits  CBC - Abnormal; Notable for the following components:   WBC 14.2 (*)    All other components within normal limits  TROPONIN I - Abnormal; Notable for the following components:   Troponin I 0.11 (*)    All other components within normal limits  CK  URINALYSIS, ROUTINE W REFLEX MICROSCOPIC  I-STAT CG4 LACTIC ACID, ED  I-STAT CG4 LACTIC ACID, ED    EKG EKG Interpretation  Date/Time:  Tuesday November 11 2017 16:15:49 EDT Ventricular Rate:  91 PR Interval:    QRS Duration: 92 QT Interval:  355 QTC Calculation: 435 R Axis:   -65 Text Interpretation:  Sinus rhythm Consider left atrial enlargement Left anterior fascicular block Anteroseptal infarct, age indeterminate No significant change since last tracing Confirmed by Dorie Rank [phone]) on 11/11/2017 4:19:35 PM   Radiology No results found.  Procedures Procedures (including critical care time)  Medications Ordered in ED Medications  aspirin chewable tablet 324 mg (has no administration in time range)  sodium chloride 0.9 % bolus 1,000 mL (0 mLs Intravenous Stopped 11/11/17 1727)     Initial Impression / Assessment and Plan / ED Course  I have reviewed the triage vital signs and the nursing notes.  Pertinent labs & imaging results that were available during my care of the patient were reviewed by me and considered in my medical decision making (see chart for details).     Patient was treated for heat related illness but also had an elevated troponin.  I feel that the elevated troponin was most likely stress-induced from the heat related illness and will need cardiac  evaluation.  I spoke with Dr. Terrence Dupont who will admit the patient to stepdown and trend his troponins and determine the next of action.  She is allergic to heparin and was given aspirin.  I spoke with the pharmacist and they will give him medications once he is in the stepdown unit since we do not have the medication here at [facility].  Final Clinical Impressions(s) / ED Diagnoses   Final diagnoses:  ACS (acute coronary syndrome) [facility])    ED Discharge Orders    None       Dalia Heading, [name] 11/11/17 Verlee Rossetti, MD 11/12/17 1415

## 2017-11-12 ENCOUNTER — Observation Stay (HOSPITAL_COMMUNITY): Payer: Medicare Other

## 2017-11-12 DIAGNOSIS — I444 Left anterior fascicular block: Secondary | ICD-10-CM | POA: Diagnosis not present

## 2017-11-12 DIAGNOSIS — K219 Gastro-esophageal reflux disease without esophagitis: Secondary | ICD-10-CM | POA: Diagnosis not present

## 2017-11-12 DIAGNOSIS — I2510 Atherosclerotic heart disease of native coronary artery without angina pectoris: Secondary | ICD-10-CM | POA: Diagnosis not present

## 2017-11-12 DIAGNOSIS — E785 Hyperlipidemia, unspecified: Secondary | ICD-10-CM | POA: Diagnosis not present

## 2017-11-12 DIAGNOSIS — I10 Essential (primary) hypertension: Secondary | ICD-10-CM | POA: Diagnosis not present

## 2017-11-12 DIAGNOSIS — R0602 Shortness of breath: Secondary | ICD-10-CM | POA: Diagnosis not present

## 2017-11-12 DIAGNOSIS — M1990 Unspecified osteoarthritis, unspecified site: Secondary | ICD-10-CM | POA: Diagnosis not present

## 2017-11-12 DIAGNOSIS — R0789 Other chest pain: Secondary | ICD-10-CM | POA: Diagnosis not present

## 2017-11-12 DIAGNOSIS — R9439 Abnormal result of other cardiovascular function study: Secondary | ICD-10-CM | POA: Diagnosis not present

## 2017-11-12 DIAGNOSIS — G4733 Obstructive sleep apnea (adult) (pediatric): Secondary | ICD-10-CM | POA: Diagnosis not present

## 2017-11-12 DIAGNOSIS — I249 Acute ischemic heart disease, unspecified: Secondary | ICD-10-CM | POA: Diagnosis not present

## 2017-11-12 DIAGNOSIS — E119 Type 2 diabetes mellitus without complications: Secondary | ICD-10-CM | POA: Diagnosis not present

## 2017-11-12 LAB — CBC
HCT: 36.4 % — ABNORMAL LOW (ref 39.0–52.0)
Hemoglobin: 12.2 g/dL — ABNORMAL LOW (ref 13.0–17.0)
MCH: 30.3 pg (ref 26.0–34.0)
MCHC: 33.5 g/dL (ref 30.0–36.0)
MCV: 90.3 fL (ref 78.0–100.0)
PLATELETS: 228 10*3/uL (ref 150–400)
RBC: 4.03 MIL/uL — AB (ref 4.22–5.81)
RDW: 12.7 % (ref 11.5–15.5)
WBC: 6.8 10*3/uL (ref 4.0–10.5)

## 2017-11-12 LAB — BASIC METABOLIC PANEL
ANION GAP: 12 (ref 5–15)
BUN: 16 mg/dL (ref 8–23)
CO2: 23 mmol/L (ref 22–32)
Calcium: 9.1 mg/dL (ref 8.9–10.3)
Chloride: 106 mmol/L (ref 98–111)
Creatinine, Ser: 0.87 mg/dL (ref 0.61–1.24)
GFR calc Af Amer: >60 mL/min (ref >60)
GFR calc non Af Amer: >60 mL/min (ref >60)
GLUCOSE: 128 mg/dL — AB (ref 70–99)
POTASSIUM: 3.9 mmol/L (ref 3.5–5.1)
Sodium: 141 mmol/L (ref 135–145)

## 2017-11-12 LAB — LIPID PANEL
CHOL/HDL RATIO: 3.1 ratio
Cholesterol: 121 mg/dL (ref 0–200)
HDL: 39 mg/dL — AB (ref >40)
LDL CALC: 58 mg/dL (ref 0–99)
Triglycerides: 119 mg/dL (ref <150)
VLDL: 24 mg/dL (ref 0–40)

## 2017-11-12 LAB — GLUCOSE, CAPILLARY
GLUCOSE-CAPILLARY: 218 mg/dL — AB (ref 70–99)
GLUCOSE-CAPILLARY: 97 mg/dL (ref 70–99)

## 2017-11-12 LAB — MRSA PCR SCREENING: MRSA BY PCR: POSITIVE — AB

## 2017-11-12 LAB — TROPONIN I
Troponin I: <0.03 ng/mL (ref <0.03)
Troponin I: <0.03 ng/mL (ref <0.03)

## 2017-11-12 LAB — APTT
APTT: 68 s — AB (ref 24–36)
APTT: 67 s — AB (ref 24–36)

## 2017-11-12 LAB — HIV ANTIBODY (ROUTINE TESTING W REFLEX): HIV Screen 4th Generation wRfx: NONREACTIVE

## 2017-11-12 MED ORDER — SODIUM CHLORIDE 0.9% FLUSH: 3.0000 mL | INTRAVENOUS | Status: DC | PRN

## 2017-11-12 MED ORDER — SODIUM CHLORIDE 0.9 % IV SOLN: 250.0000 mL | INTRAVENOUS | Status: DC | PRN

## 2017-11-12 MED ORDER — TECHNETIUM TC 99M TETROFOSMIN IV KIT: 30.0000 | PACK | Freq: Once | INTRAVENOUS | Status: DC | PRN

## 2017-11-12 MED ORDER — REGADENOSON 0.4 MG/5ML IV SOLN
0.4000 mg | Freq: Once | INTRAVENOUS | Status: AC
  Administered 2017-11-12: 0.4 mg via INTRAVENOUS
  Filled 2017-11-12: qty 5

## 2017-11-12 MED ORDER — SODIUM CHLORIDE 0.9% FLUSH
3.0000 mL | Freq: Two times a day (BID) | INTRAVENOUS | Status: DC
  Administered 2017-11-12 – 2017-11-13 (×3): 3 mL via INTRAVENOUS

## 2017-11-12 MED ORDER — REGADENOSON 0.4 MG/5ML IV SOLN
INTRAVENOUS | Status: AC
  Filled 2017-11-12: qty 5

## 2017-11-12 MED ORDER — MUPIROCIN 2 % EX OINT
1.0000 "application " | TOPICAL_OINTMENT | Freq: Two times a day (BID) | CUTANEOUS | Status: DC
  Administered 2017-11-12 – 2017-11-14 (×5): 1 via NASAL
  Filled 2017-11-12: qty 22

## 2017-11-12 MED ORDER — SODIUM CHLORIDE 0.9 % WEIGHT BASED INFUSION
3.0000 mL/kg/h | INTRAVENOUS | Status: AC
  Administered 2017-11-13: 3 mL/kg/h via INTRAVENOUS

## 2017-11-12 MED ORDER — ASPIRIN 81 MG PO CHEW
81.0000 mg | CHEWABLE_TABLET | ORAL | Status: AC
  Administered 2017-11-13: 81 mg via ORAL
  Filled 2017-11-12: qty 1

## 2017-11-12 MED ORDER — SODIUM CHLORIDE 0.9 % WEIGHT BASED INFUSION
1.0000 mL/kg/h | INTRAVENOUS | Status: DC
  Administered 2017-11-13 (×2): 1 mL/kg/h via INTRAVENOUS

## 2017-11-12 MED ORDER — TECHNETIUM TC 99M TETROFOSMIN IV KIT
10.0000 | PACK | Freq: Once | INTRAVENOUS | Status: AC | PRN
  Administered 2017-11-12: 10 via INTRAVENOUS

## 2017-11-12 MED ORDER — CHLORHEXIDINE GLUCONATE CLOTH 2 % EX PADS
6.0000 | MEDICATED_PAD | Freq: Every day | CUTANEOUS | Status: DC
  Administered 2017-11-12 – 2017-11-14 (×3): 6 via TOPICAL

## 2017-11-12 NOTE — Progress Notes (Signed)
[place] for bivalirudin Indication: chest pain/ACS  Allergies  Allergen Reactions  . Heparin Anaphylaxis    REACTION: breathing problems  . Succinylcholine Chloride Other (See Comments)    REACTION: does not wake up from anesthetic, pt. Reports that through testing that he was found  not to have the enzyme to breakdown the Succ.  . Vancomycin Hives and Other (See Comments)    Ran off a pump. So developed redman's . Running too fast  . Penicillins Rash    Patient Measurements: Height: 5\' 11"  (180.3 cm) Weight: 219 lb 14.4 oz (99.7 kg) IBW/kg (Calculated) : 75.3 Heparin Dosing Weight: 96kg  Vital Signs: Temp: 97.6 F (36.4 C) (09/18 0030) Temp Source: Oral (09/18 0030) BP: 112/62 (09/18 0030) Pulse Rate: 76 (09/18 0030)  Labs: Recent Labs    11/11/17 1624 11/11/17 2133 11/12/17 0103  HGB 14.3  --   --   HCT 41.4  --   --   PLT 303  --   --   APTT  --   --  68*  CREATININE 1.36*  --   --   CKTOTAL 126  --   --   TROPONINI 0.11* <0.03  --     Estimated Creatinine Clearance: 65.2 mL/min (A) (by C-G formula based on SCr of 1.36 mg/dL (H)).  Assessment: 67 y.o. male with chest pain for bivalirudin  Goal of Therapy:  aPTT 50-85 seconds Monitor platelets by anticoagulation protocol: Yes   Plan:  Continue bivalirudin at current rate Follow-up am labs.   Phillis Knack, PharmD, BCPS

## 2017-11-12 NOTE — Progress Notes (Signed)
Subjective:  Seen in nuclear medicine Department.  Denies any chest pain tolerated stress portion of the tests okay Nuclear scan results are pending Objective:  Vital Signs in the last 24 hours: Temp:  [97.5 F (36.4 C)-98.6 F (37 C)] 97.5 F (36.4 C) (09/18 0427) Pulse Rate:  [71-89] 84 (09/18 1119) Resp:  [12-20] 18 (09/18 0427) BP: (112-147)/(61-90) 135/87 (09/18 1119) SpO2:  [96 %-100 %] 96 % (09/18 0427) Weight:  [99.7 kg-104.3 kg] 99.9 kg (09/18 0427)  Intake/Output from previous day: 09/17 0701 - 09/18 0700 In: 1813.4 [P.O.:360; I.V.:453.4; IV Piggyback:1000] Out: -  Intake/Output from this shift: No intake/output data recorded.  Physical Exam: Neck: no adenopathy, no carotid bruit, no JVD and supple, symmetrical, trachea midline Lungs: clear to auscultation bilaterally Heart: regular rate and rhythm, S1, S2 normal, no murmur, click, rub or gallop Abdomen: soft, non-tender; bowel sounds normal; no masses,  no organomegaly Extremities: extremities normal, atraumatic, no cyanosis or edema  Lab Results: Recent Labs    11/11/17 1624 11/12/17 0530  WBC 14.2* 6.8  HGB 14.3 12.2*  PLT 303 228   Recent Labs    11/11/17 1624 11/12/17 0530  NA 138 141  K 4.1 3.9  CL 100 106  CO2 25 23  GLUCOSE 120* 128*  BUN 17 16  CREATININE 1.36* 0.87   Recent Labs    11/12/17 0530 11/12/17 1132  TROPONINI <0.03 <0.03   Hepatic Function Panel No results for input(s): PROT, ALBUMIN, AST, ALT, ALKPHOS, BILITOT, BILIDIR, IBILI in the last 72 hours. Recent Labs    11/12/17 0530  CHOL 121   No results for input(s): PROTIME in the last 72 hours.  Imaging: Imaging results have been reviewed and Dg Chest 2 View  Result Date: 11/11/2017 CLINICAL DATA:  Chest pain. EXAM: CHEST - 2 VIEW COMPARISON:  04/16/2016 FINDINGS: The heart size and mediastinal contours are within normal limits. Both lungs are clear. The visualized skeletal structures are unremarkable. IMPRESSION: No  active cardiopulmonary disease. Electronically Signed   By: Earle Gell M.D.   On: 11/11/2017 18:38    Cardiac Studies:  Assessment/Plan:  Acute coronary syndrome Hypertension Hyperlipidemia Diabetes mellitus controlled by diet GERD Degenerative joint disease Obstructive sleep apnea Plan Continue present management. Check Lexiscan Myoview results  LOS: 1 day    Charolette Forward 11/12/2017, 1:54 PM

## 2017-11-12 NOTE — Progress Notes (Signed)
   The risks and benefits of a cardiac catheterization including, but not limited to, death, stroke, MI, kidney damage and bleeding were discussed with the patient who indicates understanding and agrees to proceed.   Jill McDaniel NP-C [facility] Pager: [phone]  

## 2017-11-12 NOTE — Care Management Obs Status (Signed)
[place] NOTIFICATION   Patient Details  Name: Dylan [name] MRN: [id number] Date of Birth: Oct 26, 1950   Medicare Observation Status Notification Given:  Yes    Carles Collet, RN 11/12/2017, 3:40 PM

## 2017-11-12 NOTE — Plan of Care (Signed)
  Problem: Activity: Goal: Risk for activity intolerance will decrease Outcome: Completed/Met   Problem: Coping: Goal: Level of anxiety will decrease Outcome: Completed/Met

## 2017-11-13 DIAGNOSIS — M1990 Unspecified osteoarthritis, unspecified site: Secondary | ICD-10-CM | POA: Diagnosis not present

## 2017-11-13 DIAGNOSIS — I249 Acute ischemic heart disease, unspecified: Secondary | ICD-10-CM | POA: Diagnosis not present

## 2017-11-13 DIAGNOSIS — R0789 Other chest pain: Secondary | ICD-10-CM | POA: Diagnosis not present

## 2017-11-13 DIAGNOSIS — E785 Hyperlipidemia, unspecified: Secondary | ICD-10-CM | POA: Diagnosis not present

## 2017-11-13 DIAGNOSIS — E119 Type 2 diabetes mellitus without complications: Secondary | ICD-10-CM | POA: Diagnosis not present

## 2017-11-13 DIAGNOSIS — I2510 Atherosclerotic heart disease of native coronary artery without angina pectoris: Secondary | ICD-10-CM | POA: Diagnosis not present

## 2017-11-13 DIAGNOSIS — I444 Left anterior fascicular block: Secondary | ICD-10-CM | POA: Diagnosis not present

## 2017-11-13 DIAGNOSIS — G4733 Obstructive sleep apnea (adult) (pediatric): Secondary | ICD-10-CM | POA: Diagnosis not present

## 2017-11-13 DIAGNOSIS — K219 Gastro-esophageal reflux disease without esophagitis: Secondary | ICD-10-CM | POA: Diagnosis not present

## 2017-11-13 DIAGNOSIS — R9439 Abnormal result of other cardiovascular function study: Secondary | ICD-10-CM | POA: Diagnosis not present

## 2017-11-13 DIAGNOSIS — I10 Essential (primary) hypertension: Secondary | ICD-10-CM | POA: Diagnosis not present

## 2017-11-13 LAB — CBC
HEMATOCRIT: 38.3 % — AB (ref 39.0–52.0)
Hemoglobin: 12.3 g/dL — ABNORMAL LOW (ref 13.0–17.0)
MCH: 29.7 pg (ref 26.0–34.0)
MCHC: 32.1 g/dL (ref 30.0–36.0)
MCV: 92.5 fL (ref 78.0–100.0)
Platelets: 224 10*3/uL (ref 150–400)
RBC: 4.14 MIL/uL — AB (ref 4.22–5.81)
RDW: 12.8 % (ref 11.5–15.5)
WBC: 7.7 10*3/uL (ref 4.0–10.5)

## 2017-11-13 LAB — APTT: aPTT: 63 seconds — ABNORMAL HIGH (ref 24–36)

## 2017-11-13 LAB — GLUCOSE, CAPILLARY
GLUCOSE-CAPILLARY: 176 mg/dL — AB (ref 70–99)
Glucose-Capillary: 133 mg/dL — ABNORMAL HIGH (ref 70–99)
Glucose-Capillary: 112 mg/dL — ABNORMAL HIGH (ref 70–99)
Glucose-Capillary: 118 mg/dL — ABNORMAL HIGH (ref 70–99)

## 2017-11-13 LAB — HEMOGLOBIN A1C
Hgb A1c MFr Bld: 6.2 % — ABNORMAL HIGH (ref 4.8–5.6)
Mean Plasma Glucose: 131 mg/dL

## 2017-11-13 MED ORDER — ASPIRIN 81 MG PO CHEW
81.0000 mg | CHEWABLE_TABLET | ORAL | Status: AC
  Administered 2017-11-14: 81 mg via ORAL
  Filled 2017-11-13: qty 1

## 2017-11-13 MED ORDER — SODIUM CHLORIDE 0.9 % WEIGHT BASED INFUSION
1.0000 mL/kg/h | INTRAVENOUS | Status: DC
  Administered 2017-11-14: 1 mL/kg/h via INTRAVENOUS

## 2017-11-13 MED ORDER — SODIUM CHLORIDE 0.9 % IV SOLN: 250.0000 mL | INTRAVENOUS | Status: DC | PRN

## 2017-11-13 MED ORDER — SODIUM CHLORIDE 0.9 % WEIGHT BASED INFUSION
3.0000 mL/kg/h | INTRAVENOUS | Status: DC
  Administered 2017-11-14: 3 mL/kg/h via INTRAVENOUS

## 2017-11-13 MED ORDER — SODIUM CHLORIDE 0.9% FLUSH: 3.0000 mL | INTRAVENOUS | Status: DC | PRN

## 2017-11-13 MED ORDER — SODIUM CHLORIDE 0.9% FLUSH: 3.0000 mL | Freq: Two times a day (BID) | INTRAVENOUS | Status: DC

## 2017-11-13 NOTE — Progress Notes (Signed)
Subjective:  Patient denies any chest pain or shortness of breath.  Patient and family decided to proceed with cardiac catheterization and possible PTCA stenting by [facility] group. Nuclear stress test showed a large area of reversible ischemia in the mid and distal anteroseptal and inferolateral apical wall with normal LV systolic function.  Objective:  Vital Signs in the last 24 hours: Temp:  [97.7 F (36.5 C)-98.7 F (37.1 C)] 97.8 F (36.6 C) (09/19 0823) Pulse Rate:  [74-84] 79 (09/19 1019) Resp:  [18-20] 20 (09/19 0530) BP: (139-152)/(66-95) 152/95 (09/19 1019) SpO2:  [98 %-100 %] 100 % (09/19 0823) Weight:  [101.5 kg] 101.5 kg (09/19 0530)  Intake/Output from previous day: 09/18 0701 - 09/19 0700 In: 2351.4 [P.O.:720; I.V.:1631.4] Out: -  Intake/Output from this shift: Total I/O In: 838 [P.O.:838] Out: -   Physical Exam: Neck: no adenopathy, no carotid bruit, no JVD and supple, symmetrical, trachea midline Lungs: clear to auscultation bilaterally Heart: regular rate and rhythm, S1, S2 normal and Soft systolic murmur noted.  No S3 gallop Abdomen: soft, non-tender; bowel sounds normal; no masses,  no organomegaly Extremities: extremities normal, atraumatic, no cyanosis or edema  Lab Results: Recent Labs    11/12/17 0530 11/13/17 0445  WBC 6.8 7.7  HGB 12.2* 12.3*  PLT 228 224   Recent Labs    11/11/17 1624 11/12/17 0530  NA 138 141  K 4.1 3.9  CL 100 106  CO2 25 23  GLUCOSE 120* 128*  BUN 17 16  CREATININE 1.36* 0.87   Recent Labs    11/12/17 0530 11/12/17 1132  TROPONINI <0.03 <0.03   Hepatic Function Panel No results for input(s): PROT, ALBUMIN, AST, ALT, ALKPHOS, BILITOT, BILIDIR, IBILI in the last 72 hours. Recent Labs    11/12/17 0530  CHOL 121   No results for input(s): PROTIME in the last 72 hours.  Imaging: Imaging results have been reviewed and Dg Chest 2 View  Result Date: 11/11/2017 CLINICAL DATA:  Chest pain. EXAM: CHEST - 2 VIEW  COMPARISON:  04/16/2016 FINDINGS: The heart size and mediastinal contours are within normal limits. Both lungs are clear. The visualized skeletal structures are unremarkable. IMPRESSION: No active cardiopulmonary disease. Electronically Signed   By: Earle Gell M.D.   On: 11/11/2017 18:38   Nm Myocar Multi W/spect W/wall Motion / Ef  Result Date: 11/12/2017 CLINICAL DATA:  Shortness of breath. Diabetes, hypertension, and hyperlipidemia. EXAM: MYOCARDIAL IMAGING WITH SPECT (REST AND PHARMACOLOGIC-STRESS) GATED LEFT VENTRICULAR WALL MOTION STUDY LEFT VENTRICULAR EJECTION FRACTION TECHNIQUE: Standard myocardial SPECT imaging was performed after resting intravenous injection of 10 mCi Tc-60m tetrofosmin. Subsequently, intravenous infusion of Lexiscan was performed under the supervision of the Cardiology staff. At peak effect of the drug, 30 mCi Tc-22m tetrofosmin was injected intravenously and standard myocardial SPECT imaging was performed. Quantitative gated imaging was also performed to evaluate left ventricular wall motion, and estimate left ventricular ejection fraction. COMPARISON:  None. FINDINGS: Perfusion: A large area of decreased myocardial activity is seen on stress images in the left ventricle in the mid and distal anterior, septal, and inferoapical walls. This shows reversibility on the resting images suspicious for inducible myocardial ischemia. Wall Motion: No regional left ventricular wall motion abnormality identified. Mild left ventricular dilatation. Left Ventricular Ejection Fraction: 59 % End diastolic volume 341 ml End systolic volume 67 ml IMPRESSION: 1. Large area of reversibility involving the mid and distal anterior, septal, and inferoapical walls, suspicious for inducible myocardial ischemia. 2. Mild left ventricular dilatation,  without regional wall motion abnormality. 3. Left ventricular ejection fraction 59% 4. Non invasive risk stratification*: High *2012 Appropriate Use Criteria for  Coronary Revascularization Focused Update: J Am Coll Cardiol. 8316;74(2):552-589. http://content.airportbarriers.com.aspx?articleid=1201161 Electronically Signed   By: Earle Gell M.D.   On: 11/12/2017 15:12    Cardiac Studies:  Assessment/Plan:  Acute coronary syndrome/abnormal nuclear stress test Hypertension Hyperlipidemia Diabetes mellitus controlled by diet GERD Degenerative joint disease Obstructive sleep apnea Plan Continue present management. Schedule for left cardiac catheterization possible PTCA stenting by [facility]  later this afternoon  LOS: 1 day    Charolette Forward 11/13/2017, 12:05 PM

## 2017-11-13 NOTE — Progress Notes (Signed)
[place] for bivalirudin Indication: chest pain/ACS  Allergies  Allergen Reactions  . Heparin Anaphylaxis    REACTION: breathing problems  . Succinylcholine Chloride Other (See Comments)    REACTION: does not wake up from anesthetic, pt. Reports that through testing that he was found  not to have the enzyme to breakdown the Succ.  . Vancomycin Hives and Other (See Comments)    Ran off a pump. So developed redman's . Running too fast  . Penicillins Rash    Patient Measurements: Height: 5\' 11"  (180.3 cm) Weight: 223 lb 11.2 oz (101.5 kg) IBW/kg (Calculated) : 75.3 Heparin Dosing Weight: 96kg  Vital Signs: Temp: 97.8 F (36.6 C) (09/19 0530) Temp Source: Oral (09/19 0056) BP: 139/86 (09/19 0530) Pulse Rate: 74 (09/19 0530)  Labs: Recent Labs    11/11/17 1624 11/11/17 2133 11/12/17 0103 11/12/17 0530 11/12/17 1132 11/13/17 0445  HGB 14.3  --   --  12.2*  --  12.3*  HCT 41.4  --   --  36.4*  --  38.3*  PLT 303  --   --  228  --  224  APTT  --   --  68* 67*  --  63*  CREATININE 1.36*  --   --  0.87  --   --   CKTOTAL 126  --   --   --   --   --   TROPONINI 0.11* <0.03  --  <0.03 <0.03  --     Estimated Creatinine Clearance: 102.7 mL/min (by C-G formula based on SCr of 0.87 mg/dL).  Assessment: 66 y.o. male presenting with chest pain and allergy to heparin. Pharmacy consulted to manage bivalirudin. Plan is for heart cath.  9/19: Today aPTT continues to be therapeutic at 63 on current rate. CBC stable and wnl. Per RN no infusion issues or bleeding noted overnight.  Goal of Therapy:  aPTT 50-85 seconds Monitor platelets by anticoagulation protocol: Yes   Plan:  Continue bivalirudin at current rate of 0.2mg /kg/hr Monitor daily aPTT, CBC, s/sx of bleeding F/u after cath  Thank you for involving pharmacy in this patient's care.  Janae Bridgeman, PharmD PGY1 Pharmacy Resident Phone: [phone] 11/13/2017 7:09 AM

## 2017-11-13 NOTE — H&P (View-Only) (Signed)
Subjective:  Patient denies any chest pain or shortness of breath.  Patient and family decided to proceed with cardiac catheterization and possible PTCA stenting by [facility] group. Nuclear stress test showed a large area of reversible ischemia in the mid and distal anteroseptal and inferolateral apical wall with normal LV systolic function.  Objective:  Vital Signs in the last 24 hours: Temp:  [97.7 F (36.5 C)-98.7 F (37.1 C)] 97.8 F (36.6 C) (09/19 0823) Pulse Rate:  [74-84] 79 (09/19 1019) Resp:  [18-20] 20 (09/19 0530) BP: (139-152)/(66-95) 152/95 (09/19 1019) SpO2:  [98 %-100 %] 100 % (09/19 0823) Weight:  [101.5 kg] 101.5 kg (09/19 0530)  Intake/Output from previous day: 09/18 0701 - 09/19 0700 In: 2351.4 [P.O.:720; I.V.:1631.4] Out: -  Intake/Output from this shift: Total I/O In: 838 [P.O.:838] Out: -   Physical Exam: Neck: no adenopathy, no carotid bruit, no JVD and supple, symmetrical, trachea midline Lungs: clear to auscultation bilaterally Heart: regular rate and rhythm, S1, S2 normal and Soft systolic murmur noted.  No S3 gallop Abdomen: soft, non-tender; bowel sounds normal; no masses,  no organomegaly Extremities: extremities normal, atraumatic, no cyanosis or edema  Lab Results: Recent Labs    11/12/17 0530 11/13/17 0445  WBC 6.8 7.7  HGB 12.2* 12.3*  PLT 228 224   Recent Labs    11/11/17 1624 11/12/17 0530  NA 138 141  K 4.1 3.9  CL 100 106  CO2 25 23  GLUCOSE 120* 128*  BUN 17 16  CREATININE 1.36* 0.87   Recent Labs    11/12/17 0530 11/12/17 1132  TROPONINI <0.03 <0.03   Hepatic Function Panel No results for input(s): PROT, ALBUMIN, AST, ALT, ALKPHOS, BILITOT, BILIDIR, IBILI in the last 72 hours. Recent Labs    11/12/17 0530  CHOL 121   No results for input(s): PROTIME in the last 72 hours.  Imaging: Imaging results have been reviewed and Dg Chest 2 View  Result Date: 11/11/2017 CLINICAL DATA:  Chest pain. EXAM: CHEST - 2 VIEW  COMPARISON:  04/16/2016 FINDINGS: The heart size and mediastinal contours are within normal limits. Both lungs are clear. The visualized skeletal structures are unremarkable. IMPRESSION: No active cardiopulmonary disease. Electronically Signed   By: Earle Gell M.D.   On: 11/11/2017 18:38   Nm Myocar Multi W/spect W/wall Motion / Ef  Result Date: 11/12/2017 CLINICAL DATA:  Shortness of breath. Diabetes, hypertension, and hyperlipidemia. EXAM: MYOCARDIAL IMAGING WITH SPECT (REST AND PHARMACOLOGIC-STRESS) GATED LEFT VENTRICULAR WALL MOTION STUDY LEFT VENTRICULAR EJECTION FRACTION TECHNIQUE: Standard myocardial SPECT imaging was performed after resting intravenous injection of 10 mCi Tc-35m tetrofosmin. Subsequently, intravenous infusion of Lexiscan was performed under the supervision of the Cardiology staff. At peak effect of the drug, 30 mCi Tc-27m tetrofosmin was injected intravenously and standard myocardial SPECT imaging was performed. Quantitative gated imaging was also performed to evaluate left ventricular wall motion, and estimate left ventricular ejection fraction. COMPARISON:  None. FINDINGS: Perfusion: A large area of decreased myocardial activity is seen on stress images in the left ventricle in the mid and distal anterior, septal, and inferoapical walls. This shows reversibility on the resting images suspicious for inducible myocardial ischemia. Wall Motion: No regional left ventricular wall motion abnormality identified. Mild left ventricular dilatation. Left Ventricular Ejection Fraction: 59 % End diastolic volume 161 ml End systolic volume 67 ml IMPRESSION: 1. Large area of reversibility involving the mid and distal anterior, septal, and inferoapical walls, suspicious for inducible myocardial ischemia. 2. Mild left ventricular dilatation,  without regional wall motion abnormality. 3. Left ventricular ejection fraction 59% 4. Non invasive risk stratification*: High *2012 Appropriate Use Criteria for  Coronary Revascularization Focused Update: J Am Coll Cardiol. 5521;74(7):159-539. http://content.airportbarriers.com.aspx?articleid=[email or web address] Electronically Signed   By: Earle Gell M.D.   On: 11/12/2017 15:12    Cardiac Studies:  Assessment/Plan:  Acute coronary syndrome/abnormal nuclear stress test Hypertension Hyperlipidemia Diabetes mellitus controlled by diet GERD Degenerative joint disease Obstructive sleep apnea Plan Continue present management. Schedule for left cardiac catheterization possible PTCA stenting by [facility] [place]  later this afternoon  LOS: 1 day    Dylan [name] 11/13/2017, 12:05 PM

## 2017-11-14 ENCOUNTER — Encounter (HOSPITAL_COMMUNITY): Payer: Self-pay | Admitting: Interventional Cardiology

## 2017-11-14 ENCOUNTER — Encounter (HOSPITAL_COMMUNITY)
Admission: EM | Disposition: A | Discharge status: Home / Self Care | Payer: Self-pay | Source: Home / Self Care | Attending: Cardiology

## 2017-11-14 ENCOUNTER — Encounter: Payer: Self-pay | Admitting: Family Medicine

## 2017-11-14 DIAGNOSIS — I249 Acute ischemic heart disease, unspecified: Secondary | ICD-10-CM | POA: Diagnosis not present

## 2017-11-14 DIAGNOSIS — R9439 Abnormal result of other cardiovascular function study: Secondary | ICD-10-CM

## 2017-11-14 DIAGNOSIS — M1990 Unspecified osteoarthritis, unspecified site: Secondary | ICD-10-CM | POA: Diagnosis not present

## 2017-11-14 DIAGNOSIS — I10 Essential (primary) hypertension: Secondary | ICD-10-CM | POA: Diagnosis not present

## 2017-11-14 DIAGNOSIS — E785 Hyperlipidemia, unspecified: Secondary | ICD-10-CM | POA: Diagnosis not present

## 2017-11-14 DIAGNOSIS — G4733 Obstructive sleep apnea (adult) (pediatric): Secondary | ICD-10-CM | POA: Diagnosis not present

## 2017-11-14 DIAGNOSIS — I444 Left anterior fascicular block: Secondary | ICD-10-CM | POA: Diagnosis not present

## 2017-11-14 DIAGNOSIS — E119 Type 2 diabetes mellitus without complications: Secondary | ICD-10-CM | POA: Diagnosis not present

## 2017-11-14 DIAGNOSIS — R0789 Other chest pain: Secondary | ICD-10-CM | POA: Diagnosis not present

## 2017-11-14 DIAGNOSIS — I2510 Atherosclerotic heart disease of native coronary artery without angina pectoris: Secondary | ICD-10-CM | POA: Diagnosis not present

## 2017-11-14 DIAGNOSIS — K219 Gastro-esophageal reflux disease without esophagitis: Secondary | ICD-10-CM | POA: Diagnosis not present

## 2017-11-14 HISTORY — PX: LEFT HEART CATH AND CORONARY ANGIOGRAPHY: CATH118249

## 2017-11-14 LAB — GLUCOSE, CAPILLARY
GLUCOSE-CAPILLARY: 123 mg/dL — AB (ref 70–99)
Glucose-Capillary: 210 mg/dL — ABNORMAL HIGH (ref 70–99)

## 2017-11-14 LAB — CBC
HEMATOCRIT: 35.8 % — AB (ref 39.0–52.0)
HEMOGLOBIN: 11.9 g/dL — AB (ref 13.0–17.0)
MCH: 30.1 pg (ref 26.0–34.0)
MCHC: 33.2 g/dL (ref 30.0–36.0)
MCV: 90.4 fL (ref 78.0–100.0)
PLATELETS: 227 10*3/uL (ref 150–400)
RBC: 3.96 MIL/uL — AB (ref 4.22–5.81)
RDW: 12.4 % (ref 11.5–15.5)
WBC: 7.3 10*3/uL (ref 4.0–10.5)

## 2017-11-14 LAB — APTT: aPTT: 64 seconds — ABNORMAL HIGH (ref 24–36)

## 2017-11-14 SURGERY — LEFT HEART CATH AND CORONARY ANGIOGRAPHY
Anesthesia: LOCAL

## 2017-11-14 MED ORDER — LIDOCAINE HCL (PF) 1 % IJ SOLN
INTRAMUSCULAR | Status: AC
  Filled 2017-11-14: qty 30

## 2017-11-14 MED ORDER — SODIUM CHLORIDE 0.9 % IV SOLN: 250.0000 mL | INTRAVENOUS | Status: DC | PRN

## 2017-11-14 MED ORDER — SODIUM CHLORIDE 0.9% FLUSH: 3.0000 mL | INTRAVENOUS | Status: DC | PRN

## 2017-11-14 MED ORDER — VERAPAMIL HCL 2.5 MG/ML IV SOLN
INTRAVENOUS | Status: AC
  Filled 2017-11-14: qty 2

## 2017-11-14 MED ORDER — FENTANYL CITRATE (PF) 100 MCG/2ML IJ SOLN
INTRAMUSCULAR | Status: AC
  Filled 2017-11-14: qty 2

## 2017-11-14 MED ORDER — HEPARIN (PORCINE) IN NACL 1000-0.9 UT/500ML-% IV SOLN
INTRAVENOUS | Status: AC
  Filled 2017-11-14: qty 1000

## 2017-11-14 MED ORDER — FENTANYL CITRATE (PF) 100 MCG/2ML IJ SOLN
INTRAMUSCULAR | Status: DC | PRN
  Administered 2017-11-14 (×2): 25 ug via INTRAVENOUS

## 2017-11-14 MED ORDER — MIDAZOLAM HCL 2 MG/2ML IJ SOLN
INTRAMUSCULAR | Status: DC | PRN
  Administered 2017-11-14: 2 mg via INTRAVENOUS
  Administered 2017-11-14: 1 mg via INTRAVENOUS

## 2017-11-14 MED ORDER — SODIUM CHLORIDE 0.9 % IV SOLN
INTRAVENOUS | Status: AC | PRN
  Administered 2017-11-14: 999 mL via INTRAVENOUS

## 2017-11-14 MED ORDER — MIDAZOLAM HCL 2 MG/2ML IJ SOLN
INTRAMUSCULAR | Status: AC
  Filled 2017-11-14: qty 2

## 2017-11-14 MED ORDER — LIDOCAINE HCL (PF) 1 % IJ SOLN
INTRAMUSCULAR | Status: DC | PRN
  Administered 2017-11-14: 2 mL

## 2017-11-14 MED ORDER — ONDANSETRON HCL 4 MG/2ML IJ SOLN: 4.0000 mg | Freq: Four times a day (QID) | INTRAMUSCULAR | Status: DC | PRN

## 2017-11-14 MED ORDER — ACETAMINOPHEN 325 MG PO TABS: 650.0000 mg | ORAL_TABLET | ORAL | Status: DC | PRN

## 2017-11-14 MED ORDER — VERAPAMIL HCL 2.5 MG/ML IV SOLN
INTRAVENOUS | Status: DC | PRN
  Administered 2017-11-14: 10 mL via INTRA_ARTERIAL

## 2017-11-14 MED ORDER — SODIUM CHLORIDE 0.9% FLUSH: 3.0000 mL | Freq: Two times a day (BID) | INTRAVENOUS | Status: DC

## 2017-11-14 MED ORDER — NITROGLYCERIN 0.4 MG SL SUBL: 0.4000 mg | SUBLINGUAL_TABLET | SUBLINGUAL | 12 refills | Status: DC | PRN

## 2017-11-14 MED ORDER — SODIUM CHLORIDE 0.9 % IV SOLN
INTRAVENOUS | Status: AC
  Administered 2017-11-14: 11:00:00 via INTRAVENOUS

## 2017-11-14 MED ORDER — IOHEXOL 350 MG/ML SOLN
INTRAVENOUS | Status: DC | PRN
  Administered 2017-11-14: 80 mL via INTRACARDIAC

## 2017-11-14 SURGICAL SUPPLY — 10 items
CATH 5FR JL3.5 JR4 ANG PIG MP (CATHETERS) ×2 IMPLANT
CATH INFINITI 5FR JL4 (CATHETERS) ×2 IMPLANT
DEVICE RAD COMP TR BAND LRG (VASCULAR PRODUCTS) ×2 IMPLANT
GLIDESHEATH SLEND SS 6F .021 (SHEATH) ×2 IMPLANT
GUIDEWIRE INQWIRE 1.5J.035X260 (WIRE) ×1 IMPLANT
INQWIRE 1.5J .035X260CM (WIRE) ×2
KIT HEART LEFT (KITS) ×2 IMPLANT
PACK CARDIAC CATHETERIZATION (CUSTOM PROCEDURE TRAY) ×2 IMPLANT
TRANSDUCER W/STOPCOCK (MISCELLANEOUS) ×2 IMPLANT
TUBING CIL FLEX 10 FLL-RA (TUBING) ×2 IMPLANT

## 2017-11-14 NOTE — Plan of Care (Signed)
  Problem: Clinical Measurements: Goal: Ability to maintain clinical measurements within normal limits will improve Outcome: Completed/Met Goal: Will remain free from infection Outcome: Completed/Met

## 2017-11-14 NOTE — Discharge Summary (Addendum)
Discharge Summary    Patient ID: Dylan [name] MRN: [id number]; DOB: 11/21/1950  Admit date: 11/11/2017 Discharge date: 11/14/2017  Primary Care Provider: [name], MD  Primary Cardiologist: New to [facility]  Discharge Diagnoses    Active Problems:   ACS (acute coronary syndrome) [facility])   Acute coronary syndrome (HCC)   Abnormal stress test   Allergies Allergies  Allergen Reactions  . Heparin Anaphylaxis    REACTION: breathing problems  . Succinylcholine Chloride Other (See Comments)    REACTION: does not wake up from anesthetic, pt. Reports that through testing that he was found  not to have the enzyme to breakdown the Succ.  . Vancomycin Hives and Other (See Comments)    Ran off a pump. So developed redman's . Running too fast  . Penicillins Rash    Diagnostic Studies/Procedures    Stress test 11/12/17 IMPRESSION: 1. Large area of reversibility involving the mid and distal anterior, septal, and inferoapical walls, suspicious for inducible myocardial ischemia.  2. Mild left ventricular dilatation, without regional wall motion abnormality.  3. Left ventricular ejection fraction 59%  4. Non invasive risk stratification*: High   History of Present Illness     67 year old male with past medical history of hypertension, hyperlipidemia, diabetes (diet-controlled), degenerative joint disease, GERD and sleep apnea on CPAP presented to [facility] 11/11/17 for chest cramping/pressure while playing golf.  Associated with shortness of breath.  EKG shows sinus rhythm with left anterior fascicular block.  He was transferred to [facility] under Dr. Gardiner Coins (on call) care for rule one.   Has remotely seen Dr. Harrington Challenger for surgical clearance.  He was cleared without any testing.  Hospital Course     Consultants: None  His initial troponin was 0.11 at [facility] and then remained negative x 3.  He underwent stress test which resulted high  risk with large area of reversibility involving the mid and distal anterior, septal, and inferoapical walls, suspicious for inducible myocardial ischemia.  Remain chest pain-free during admission.  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  LVEDP minimally elevated 18 mmHg.  Likely due to over hydration prior cath as it was cancel a day prior.  He observed post cath.  He is deemed stable for discharge.  Her pressure minimally elevated here.  However, he was off his valsartan while here.  Will continue to follow as outpatient.   Discharge Vitals Blood pressure (!) 143/78, pulse 76, temperature 98.5 F (36.9 C), temperature source Oral, resp. rate 17, height 5' 11"  (1.803 m), weight 100.7 kg, SpO2 99 %.  Filed Weights   11/12/17 0427 11/13/17 0530 11/14/17 0446  Weight: 99.9 kg 101.5 kg 100.7 kg    Labs & Radiologic Studies    CBC Recent Labs    11/13/17 0445 11/14/17 0520  WBC 7.7 7.3  HGB 12.3* 11.9*  HCT 38.3* 35.8*  MCV 92.5 90.4  PLT 224 497   Basic Metabolic Panel Recent Labs    11/11/17 1624 11/12/17 0530  NA 138 141  K 4.1 3.9  CL 100 106  CO2 25 23  GLUCOSE 120* 128*  BUN 17 16  CREATININE 1.36* 0.87  CALCIUM 9.7 9.1   Cardiac Enzymes Recent Labs    11/11/17 1624 11/11/17 2133 11/12/17 0530 11/12/17 1132  CKTOTAL 126  --   --   --   TROPONINI 0.11* <0.03 <0.03 <0.03   Hemoglobin A1C Recent Labs  11/11/17 2133  HGBA1C 6.2*   Fasting Lipid Panel Recent Labs    11/12/17 0530  CHOL 121  HDL 39*  LDLCALC 58  TRIG 119  CHOLHDL 3.1   Thyroid Function Tests No results for input(s): TSH, T4TOTAL, T3FREE, THYROIDAB in the last 72 hours.  Invalid input(s): FREET3 _____________  Dg Chest 2 View  Result Date: 11/11/2017 CLINICAL DATA:  Chest pain. EXAM: CHEST - 2 VIEW COMPARISON:  04/16/2016 FINDINGS: The heart size and mediastinal contours are within normal limits. Both lungs are clear. The visualized skeletal  structures are unremarkable. IMPRESSION: No active cardiopulmonary disease. Electronically Signed   By: Earle Gell M.D.   On: 11/11/2017 18:38   Nm Myocar Multi W/spect W/wall Motion / Ef  Result Date: 11/12/2017 CLINICAL DATA:  Shortness of breath. Diabetes, hypertension, and hyperlipidemia. EXAM: MYOCARDIAL IMAGING WITH SPECT (REST AND PHARMACOLOGIC-STRESS) GATED LEFT VENTRICULAR WALL MOTION STUDY LEFT VENTRICULAR EJECTION FRACTION TECHNIQUE: Standard myocardial SPECT imaging was performed after resting intravenous injection of 10 mCi Tc-64mtetrofosmin. Subsequently, intravenous infusion of Lexiscan was performed under the supervision of the Cardiology staff. At peak effect of the drug, 30 mCi Tc-959metrofosmin was injected intravenously and standard myocardial SPECT imaging was performed. Quantitative gated imaging was also performed to evaluate left ventricular wall motion, and estimate left ventricular ejection fraction. COMPARISON:  None. FINDINGS: Perfusion: A large area of decreased myocardial activity is seen on stress images in the left ventricle in the mid and distal anterior, septal, and inferoapical walls. This shows reversibility on the resting images suspicious for inducible myocardial ischemia. Wall Motion: No regional left ventricular wall motion abnormality identified. Mild left ventricular dilatation. Left Ventricular Ejection Fraction: 59 % End diastolic volume 16471l End systolic volume 67 ml IMPRESSION: 1. Large area of reversibility involving the mid and distal anterior, septal, and inferoapical walls, suspicious for inducible myocardial ischemia. 2. Mild left ventricular dilatation, without regional wall motion abnormality. 3. Left ventricular ejection fraction 59% 4. Non invasive risk stratification*: High *2012 Appropriate Use Criteria for Coronary Revascularization Focused Update: J Am Coll Cardiol. 208550;15(8):682-574http://content.onairportbarriers.comspx?articleid=1201161  Electronically Signed   By: JoEarle Gell.D.   On: 11/12/2017 15:12   Disposition   Pt is being discharged home today in good condition.  Follow-up Plans & Appointments    [facility] Information    [facility]   Specialty:  Cardiology Why:  @11 :30 for followup  Contact information: [address]  [place] [phone]        Discharge Instructions    Diet - low sodium heart healthy   Complete by:  As directed    Discharge instructions   Complete by:  As directed    No driving for 48 hours.. No lifting over 5 lbs for 1 week. No sexual activity for 1 week.  Do not play golf until seen in clinic.  Keep procedure site clean & dry. If you notice increased pain, swelling, bleeding or pus, call/return!  You may shower, but no soaking baths/hot tubs/pools for 1 week.   Increase activity slowly   Complete by:  As directed       Discharge Medications   Allergies as of 11/14/2017      Reactions   Heparin Anaphylaxis   REACTION: breathing problems   Succinylcholine Chloride Other (See Comments)   REACTION: does not wake up from anesthetic, pt. Reports that through testing that he was found  not to have  the enzyme to breakdown the Succ.   Vancomycin Hives, Other (See Comments)   Ran off a pump. So developed redman's . Running too fast   Penicillins Rash      Medication List    STOP taking these medications   doxycycline 100 MG tablet Commonly known as:  VIBRA-TABS     TAKE these medications   amLODipine 5 MG tablet Commonly known as:  NORVASC Take 1 tablet (5 mg total) by mouth daily.   aspirin EC 81 MG tablet Take 1 tablet (81 mg total) by mouth daily.   co-enzyme Q-10 30 MG capsule Take 30 mg by mouth daily.   dutasteride 0.5 MG capsule Commonly known as:  AVODART TAKE 1 CAPSULE (0.5 MG TOTAL) BY MOUTH DAILY.   esomeprazole 40 MG capsule Commonly known as:  NEXIUM TAKE ONE CAPSULE BY MOUTH EVERY DAY  BEFORE BREAKFAST What changed:  See the new instructions.   HYDROcodone-acetaminophen 10-325 MG tablet Commonly known as:  NORCO Take 1 tablet by mouth every 6 (six) hours as needed for severe pain.   lidocaine 5 % Commonly known as:  LIDODERM Place 1 patch onto the skin daily. May wear up to 12 hours   Melatonin 5 MG Tabs Take 1 tablet by mouth at bedtime.   mupirocin ointment 2 % Commonly known as:  BACTROBAN Apply 1 application topically 2 (two) times daily.   naloxone 4 MG/0.1ML Liqd nasal spray kit Commonly known as:  NARCAN Use as directed   nitroGLYCERIN 0.4 MG SL tablet Commonly known as:  NITROSTAT Place 1 tablet (0.4 mg total) under the tongue every 5 (five) minutes x 3 doses as needed for chest pain.   nortriptyline 10 MG capsule Commonly known as:  PAMELOR Start nortriptyline 51m at bedtime for 2 week, then increase to 2 tablet at bedtime   rosuvastatin 10 MG tablet Commonly known as:  CRESTOR TAKE 1 TABLET BY MOUTH EVERY DAY   tamsulosin 0.4 MG Caps capsule Commonly known as:  FLOMAX Take 0.4 mg by mouth.   valsartan 80 MG tablet Commonly known as:  DIOVAN Take 1 tablet (80 mg total) by mouth daily.   zolpidem 10 MG tablet Commonly known as:  AMBIEN TAKE 1 TABLET BY MOUTH EVERY DAY AT BEDTIME AS NEEDED        Acute coronary syndrome (MI, NSTEMI, STEMI, etc) this admission?: No.    Outstanding Labs/Studies   None   Duration of Discharge Encounter   Greater than 30 minutes including physician time.  Signed,Leanor Kail PA 11/14/2017, 12:39 PM  I have examined the patient and reviewed assessment and plan and discussed with patient.  Agree with above as stated. GEN: Well nourished, well developed, in no acute distress  HEENT: normal  Neck: no JVD, carotid bruits, or masses Cardiac: RRR; no murmurs, rubs, or gallops,no edema  Respiratory:  clear to auscultation bilaterally, normal work of breathing GI: soft, nontender, nondistended,    MS: no deformity or atrophy  Skin: warm and dry, no rash Neuro:  Strength and sensation are intact Psych: euthymic mood, full affect    Cath without significant disease.  False positive stress test.  COntinue preventive therapy. Crestor for hyperlipidemia.  Healthy diet and regular exercise.   Contact the office for recurrent sx.   JLarae Grooms

## 2017-11-14 NOTE — Interval H&P Note (Signed)
Cath Lab Visit (complete for each Cath Lab visit)  Clinical Evaluation Leading to the Procedure:   ACS: Yes.    Non-ACS:    Anginal Classification: CCS IV  Anti-ischemic medical therapy: Minimal Therapy (1 class of medications)  Non-Invasive Test Results: Intermediate-risk stress test findings: cardiac mortality 1-3%/year  Prior CABG: No previous CABG      History and Physical Interval Note:  11/14/2017 9:00 AM  Dylan [name]  has presented today for surgery, with the diagnosis of as  The various methods of treatment have been discussed with the patient and family. After consideration of risks, benefits and other options for treatment, the patient has consented to  Procedure(s): LEFT HEART CATH AND CORONARY ANGIOGRAPHY (N/A) as a surgical intervention .  The patient's history has been reviewed, patient examined, no change in status, stable for surgery.  I have reviewed the patient's chart and labs.  Questions were answered to the patient's satisfaction.     Larae Grooms

## 2017-11-14 NOTE — Progress Notes (Signed)
Subjective:  Doing well denies any chest pain or shortness of breath, underwent left cardiac catheterization this a.m. Noted to have no significant CAD  Objective:  Vital Signs in the last 24 hours: Temp:  [97.8 F (36.6 C)-98 F (36.7 C)] 98 F (36.7 C) (09/20 0759) Pulse Rate:  [64-87] 70 (09/20 0929) Resp:  [11-28] 17 (09/20 0929) BP: (133-167)/(68-109) 149/94 (09/20 0929) SpO2:  [97 %-100 %] 99 % (09/20 0929) Weight:  [100.7 kg] 100.7 kg (09/20 0446)  Intake/Output from previous day: 09/19 0701 - 09/20 0700 In: 1503.9 [P.O.:1018; I.V.:485.9] Out: -  Intake/Output from this shift: Total I/O In: 517.7 [I.V.:517.7] Out: -   Physical Exam: Neck: no adenopathy, no carotid bruit, no JVD and supple, symmetrical, trachea midline Lungs: clear to auscultation bilaterally Heart: regular rate and rhythm, S1, S2 normal and soft systolic murmur noted Abdomen: soft, non-tender; bowel sounds normal; no masses,  no organomegaly Extremities: extremities normal, atraumatic, no cyanosis or edema and TR band noted with good peripheral circulation  Lab Results: Recent Labs    11/13/17 0445 11/14/17 0520  WBC 7.7 7.3  HGB 12.3* 11.9*  PLT 224 227   Recent Labs    11/11/17 1624 11/12/17 0530  NA 138 141  K 4.1 3.9  CL 100 106  CO2 25 23  GLUCOSE 120* 128*  BUN 17 16  CREATININE 1.36* 0.87   Recent Labs    11/12/17 0530 11/12/17 1132  TROPONINI <0.03 <0.03   Hepatic Function Panel No results for input(s): PROT, ALBUMIN, AST, ALT, ALKPHOS, BILITOT, BILIDIR, IBILI in the last 72 hours. Recent Labs    11/12/17 0530  CHOL 121   No results for input(s): PROTIME in the last 72 hours.  Imaging: Imaging results have been reviewed and Nm Myocar Multi W/spect W/wall Motion / Ef  Result Date: 11/12/2017 CLINICAL DATA:  Shortness of breath. Diabetes, hypertension, and hyperlipidemia. EXAM: MYOCARDIAL IMAGING WITH SPECT (REST AND PHARMACOLOGIC-STRESS) GATED LEFT VENTRICULAR WALL  MOTION STUDY LEFT VENTRICULAR EJECTION FRACTION TECHNIQUE: Standard myocardial SPECT imaging was performed after resting intravenous injection of 10 mCi Tc-67m tetrofosmin. Subsequently, intravenous infusion of Lexiscan was performed under the supervision of the Cardiology staff. At peak effect of the drug, 30 mCi Tc-42m tetrofosmin was injected intravenously and standard myocardial SPECT imaging was performed. Quantitative gated imaging was also performed to evaluate left ventricular wall motion, and estimate left ventricular ejection fraction. COMPARISON:  None. FINDINGS: Perfusion: A large area of decreased myocardial activity is seen on stress images in the left ventricle in the mid and distal anterior, septal, and inferoapical walls. This shows reversibility on the resting images suspicious for inducible myocardial ischemia. Wall Motion: No regional left ventricular wall motion abnormality identified. Mild left ventricular dilatation. Left Ventricular Ejection Fraction: 59 % End diastolic volume 789 ml End systolic volume 67 ml IMPRESSION: 1. Large area of reversibility involving the mid and distal anterior, septal, and inferoapical walls, suspicious for inducible myocardial ischemia. 2. Mild left ventricular dilatation, without regional wall motion abnormality. 3. Left ventricular ejection fraction 59% 4. Non invasive risk stratification*: High *2012 Appropriate Use Criteria for Coronary Revascularization Focused Update: J Am Coll Cardiol. 3810;17(5):102-585. http://content.airportbarriers.com.aspx?articleid=1201161 Electronically Signed   By: Earle Gell M.D.   On: 11/12/2017 15:12    Cardiac Studies:  Assessment/Plan:  Nonobstructive CAD, status post left cardiac catheterization. Hypertension Hyperlipidemia Diabetes mellitus controlled by diet GERD Degenerative joint disease Obstructive sleep apnea Plan Continue present management. Patient will be discharged via [facility] later today I  will  sign off  LOS: 1 day    Dylan [name] 11/14/2017, 11:39 AM

## 2017-11-17 MED FILL — Heparin Sod (Porcine)-NaCl IV Soln 1000 Unit/500ML-0.9%: INTRAVENOUS | Qty: 500 | Status: AC

## 2017-11-17 MED FILL — Verapamil HCl IV Soln 2.5 MG/ML: INTRAVENOUS | Qty: 2 | Status: AC

## 2017-11-26 ENCOUNTER — Ambulatory Visit: Payer: Medicare HMO | Admitting: Physician Assistant

## 2017-11-26 ENCOUNTER — Other Ambulatory Visit: Payer: Self-pay | Admitting: Internal Medicine

## 2017-11-26 DIAGNOSIS — E785 Hyperlipidemia, unspecified: Secondary | ICD-10-CM

## 2017-11-26 DIAGNOSIS — R350 Frequency of micturition: Secondary | ICD-10-CM

## 2017-11-27 DIAGNOSIS — M7711 Lateral epicondylitis, right elbow: Secondary | ICD-10-CM | POA: Diagnosis not present

## 2017-11-27 DIAGNOSIS — M7712 Lateral epicondylitis, left elbow: Secondary | ICD-10-CM | POA: Diagnosis not present

## 2017-11-27 DIAGNOSIS — G5601 Carpal tunnel syndrome, right upper limb: Secondary | ICD-10-CM | POA: Diagnosis not present

## 2017-12-02 ENCOUNTER — Other Ambulatory Visit: Payer: Self-pay | Admitting: Family Medicine

## 2017-12-04 ENCOUNTER — Encounter: Payer: Self-pay | Admitting: Family Medicine

## 2017-12-04 DIAGNOSIS — M5412 Radiculopathy, cervical region: Secondary | ICD-10-CM

## 2017-12-04 DIAGNOSIS — M150 Primary generalized (osteo)arthritis: Principal | ICD-10-CM

## 2017-12-04 DIAGNOSIS — M159 Polyosteoarthritis, unspecified: Secondary | ICD-10-CM

## 2017-12-04 DIAGNOSIS — M4692 Unspecified inflammatory spondylopathy, cervical region: Secondary | ICD-10-CM

## 2017-12-04 MED ORDER — HYDROCODONE-ACETAMINOPHEN 10-325 MG PO TABS: 1.0000 | ORAL_TABLET | Freq: Four times a day (QID) | ORAL | 0 refills | Status: DC | PRN

## 2017-12-04 NOTE — Telephone Encounter (Signed)
Last OV 10/10/17 Hydrocodone last filled 11/07/17 #120 with 0

## 2017-12-05 ENCOUNTER — Encounter: Payer: Self-pay | Admitting: Family Medicine

## 2017-12-06 DIAGNOSIS — M7711 Lateral epicondylitis, right elbow: Secondary | ICD-10-CM | POA: Diagnosis not present

## 2017-12-09 ENCOUNTER — Other Ambulatory Visit: Payer: Self-pay | Admitting: Family Medicine

## 2017-12-09 DIAGNOSIS — G4700 Insomnia, unspecified: Secondary | ICD-10-CM

## 2017-12-09 DIAGNOSIS — G4730 Sleep apnea, unspecified: Principal | ICD-10-CM

## 2017-12-10 NOTE — Telephone Encounter (Signed)
Last OV 10/10/17 Zolpidem last filled 09/10/17 #30 with 2

## 2017-12-11 ENCOUNTER — Ambulatory Visit: Payer: Medicare HMO | Admitting: Physician Assistant

## 2017-12-11 ENCOUNTER — Encounter: Payer: Self-pay | Admitting: Physician Assistant

## 2017-12-11 VITALS — BP 150/80 | HR 90 | Ht 71.0 in | Wt 228.2 lb

## 2017-12-11 DIAGNOSIS — I2510 Atherosclerotic heart disease of native coronary artery without angina pectoris: Secondary | ICD-10-CM | POA: Diagnosis not present

## 2017-12-11 DIAGNOSIS — G4733 Obstructive sleep apnea (adult) (pediatric): Secondary | ICD-10-CM

## 2017-12-11 DIAGNOSIS — E782 Mixed hyperlipidemia: Secondary | ICD-10-CM | POA: Diagnosis not present

## 2017-12-11 DIAGNOSIS — Z9989 Dependence on other enabling machines and devices: Secondary | ICD-10-CM | POA: Diagnosis not present

## 2017-12-11 DIAGNOSIS — M7711 Lateral epicondylitis, right elbow: Secondary | ICD-10-CM | POA: Diagnosis not present

## 2017-12-11 DIAGNOSIS — I10 Essential (primary) hypertension: Secondary | ICD-10-CM | POA: Diagnosis not present

## 2017-12-11 MED ORDER — AMLODIPINE BESYLATE 10 MG PO TABS: 10.0000 mg | ORAL_TABLET | Freq: Every day | ORAL | 3 refills | Status: DC

## 2017-12-11 NOTE — Progress Notes (Signed)
Cardiology Office Note    Date:  12/11/2017   ID:  XIAN ALVES, DOB 1950-04-25, MRN [id number]  PCP:  [name], MD  Cardiologist:  Dr. Harrington Challenger  Chief Complaint: Hospital follow up   History of Present Illness:   REMMINGTON URIETA is a 67 y.o. male with past medical history of hypertension, hyperlipidemia, diabetes (diet-controlled), degenerative joint disease, GERD, sleep apnea on CPAP and recent CAD presents for hospital follow up.   Has has seen Dr. Harrington Challenger in 2014 for surgical clearance.  He was cleared without any testing.  Presented to [facility] 11/11/17 for chest cramping/pressure while playing golf.  Associated with shortness of breath.  EKG shows sinus rhythm with left anterior fascicular block.  He was transferred to [facility] under Dr. Gardiner Coins (on call) care for rule one. His initial troponin was 0.11 at [facility] and then remained negative x 3.  He underwent stress test which resulted high risk with large area of reversibility involving the mid and distal anterior, septal, and inferoapical walls, suspicious for inducible myocardial ischemia.  Remain chest pain-free during admission.  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  LVEDP minimally elevated 18 mmHg.  Likely due to over hydration prior cath as it was cancel a day prior. BP minimally elevated in setting of holding valsartan.   Here today for follow up. BP of 150/80 today. Took his meds 2 hours ago. Does not check his BP at home. Back to playing golf without any issue. The patient denies nausea, vomiting, fever, chest pain, palpitations, shortness of breath, orthopnea, PND, dizziness, syncope, cough, congestion, abdominal pain, hematochezia, melena, lower extremity edema.  Past Medical History:  Diagnosis Date  . Allergy    allergic rhinitis  . Anxiety    xanax as needed   . Arthritis    R knee, hands , back   . Cancer ([place])    melonoma- on  face, treated with excision   . Complication of anesthesia    trouble waking up with succycholine, does not have enzyme to break down succycholine  . Diabetes mellitus    was prediabetic  . Dizziness    MRI brain unremarkable 02/05/2015.  Marland Kitchen GERD (gastroesophageal reflux disease)   . Headache(784.0)   . Hepatitis age 80   history of infections Hepatitis type a  . History of melanoma 15 yrs ago   face and forehead-- previously followed by Derm in [place]  . History of ulcerative colitis    non recent flares  . Hyperlipidemia   . Hypertension   . Sleep apnea    last sleep study 10 yrs ago,CPAP- intermittent use, not every night      Past Surgical History:  Procedure Laterality Date  . acl and mensicus repair reconstruct Right 1980's  . ANTERIOR CERVICAL DECOMP/DISCECTOMY FUSION  10/15/2011   Procedure: ANTERIOR CERVICAL DECOMPRESSION/DISCECTOMY FUSION 1 LEVEL;  Surgeon: [name], MD;  Location: [facility];  Service: Neurosurgery;  Laterality: N/A;  Cervical four-five Anterior cervical decompression/diskectomy/fusion  . APPENDECTOMY  1969  . COLONOSCOPY    . HERNIA REPAIR     inguinal / abdominal - repaired as an infant   . JOINT REPLACEMENT    . KNEE ARTHROSCOPY Left last done oct 2013   x 3  . KNEE SURGERY  yrs ago   right knee  . LEFT HEART CATH AND CORONARY ANGIOGRAPHY N/A 11/14/2017   Procedure: LEFT HEART CATH  AND CORONARY ANGIOGRAPHY;  Surgeon: [name], MD;  Location: [facility];  Service: Cardiovascular;  Laterality: N/A;  . LUMBAR LAMINECTOMY WITH COFLEX 2 LEVEL N/A 09/12/2015   Procedure: L3-4 L4-5 Laminectomy with coflex;  Surgeon: [name], MD;  Location: [facility];  Service: Neurosurgery;  Laterality: N/A;  L3-4 L4-5 Laminectomy with coflex  . NASAL SINUS SURGERY     multiple sinus surgery - 1980's   . TONSILLECTOMY  1969  . TOTAL KNEE ARTHROPLASTY Left 10/22/2012   Procedure: TOTAL KNEE ARTHROPLASTY;  Surgeon: [name], MD;   Location: [facility];  Service: Orthopedics;  Laterality: Left;    Current Medications: Prior to Admission medications   Medication Sig Start Date End Date Taking? Authorizing Provider  nortriptyline (PAMELOR) 10 MG capsule Take 10 mg by mouth at bedtime.   Yes [provider]  amLODipine (NORVASC) 5 MG tablet Take 1 tablet (5 mg total) by mouth daily. 07/17/17   [name], MD  aspirin EC 81 MG tablet Take 1 tablet (81 mg total) by mouth daily. 10/24/12   [name], [name]  co-enzyme Q-10 30 MG capsule Take 30 mg by mouth daily.    [provider]  dutasteride (AVODART) 0.5 MG capsule TAKE 1 CAPSULE (0.5 MG TOTAL) BY MOUTH DAILY. 06/18/17   [name], MD  esomeprazole (NEXIUM) 40 MG capsule TAKE ONE CAPSULE BY MOUTH EVERY DAY BEFORE BREAKFAST 05/19/17   [name], MD  HYDROcodone-acetaminophen (NORCO) 10-325 MG tablet Take 1 tablet by mouth every 6 (six) hours as needed for severe pain. 12/04/17   [name], MD  lidocaine (LIDODERM) 5 % Place 1 patch onto the skin daily. May wear up to 12 hours 11/02/17   [provider]  Melatonin 5 MG TABS Take 1 tablet by mouth at bedtime.     [provider]  mupirocin ointment (BACTROBAN) 2 % Apply 1 application topically 2 (two) times daily. 10/10/17   [name], MD  naloxone [facility]) nasal spray 4 mg/0.1 mL Use as directed 07/22/17   [name], MD  nitroGLYCERIN (NITROSTAT) 0.4 MG SL tablet Place 1 tablet (0.4 mg total) under the tongue every 5 (five) minutes x 3 doses as needed for chest pain. 11/14/17   Vidyuth Belsito, [name], PA  rosuvastatin (CRESTOR) 10 MG tablet TAKE 1 TABLET BY MOUTH EVERY DAY 05/11/17   [name], MD  tamsulosin (FLOMAX) 0.4 MG CAPS capsule Take 0.4 mg by mouth.    [provider]  valsartan (DIOVAN) 80 MG tablet TAKE 1 TABLET BY MOUTH EVERY DAY 12/02/17   [name], MD  zolpidem (AMBIEN) 10 MG tablet TAKE 1 TABLET BY MOUTH EVERY DAY AT  BEDTIME AS NEEDED 12/11/17   [name], MD    Allergies:   Heparin; Succinylcholine chloride; Vancomycin; and Penicillins   Social History   Socioeconomic History  . Marital status: Married    Spouse name: Not on file  . Number of children: 3  . Years of education: Not on file  . Highest education level: Not on file  Occupational History  . Occupation: Materials engineer  Social Needs  . Financial resource strain: Not on file  . Food insecurity:    Worry: Not on file    Inability: Not on file  . Transportation needs:    Medical: Not on file    Non-medical: Not on file  Tobacco Use  . Smoking status: Never Smoker  .  Smokeless tobacco: Former Systems developer    Types: Chew  Substance and Sexual Activity  . Alcohol use: No  . Drug use: No  . Sexual activity: Not on file  Lifestyle  . Physical activity:    Days per week: Not on file    Minutes per session: Not on file  . Stress: Not on file  Relationships  . Social connections:    Talks on phone: Not on file    Gets together: Not on file    Attends religious service: Not on file    Active member of club or organization: Not on file    Attends meetings of clubs or organizations: Not on file    Relationship status: Not on file  Other Topics Concern  . Not on file  Social History Narrative   Wife works at cath lab at [employer] advanced breast cancer   Lives with wife in a 2 story home.  Retired.  Education: college.      Family History:  The patient's family history includes Cancer in his brother; Diabetes in his father; Ovarian cancer in his mother.  ROS:   Please see the history of present illness.    ROS All other systems reviewed and are negative.   PHYSICAL EXAM:   VS:  BP (!) 150/80   Pulse 90   Ht 5\' 11"  (1.803 m)   Wt 228 lb 3.2 oz (103.5 kg)   SpO2 99%   BMI 31.83 kg/m    GEN: Well nourished, well developed, in no acute distress  HEENT: normal  Neck: no JVD, carotid bruits, or masses Cardiac: RRR; no  murmurs, rubs, or gallops,no edema  Respiratory:  clear to auscultation bilaterally, normal work of breathing GI: soft, nontender, nondistended, + BS MS: no deformity or atrophy  Skin: warm and dry, no rash Neuro:  Alert and Oriented x 3, Strength and sensation are intact Psych: euthymic mood, full affect  Wt Readings from Last 3 Encounters:  12/11/17 228 lb 3.2 oz (103.5 kg)  11/14/17 222 lb 1.6 oz (100.7 kg)  10/13/17 228 lb 8 oz (103.6 kg)      Studies/Labs Reviewed:   EKG:  EKG is not ordered today.    Recent Labs: 08/20/2017: ALT 13; TSH 1.72 11/12/2017: BUN 16; Creatinine, Ser 0.87; Potassium 3.9; Sodium 141 11/14/2017: Hemoglobin 11.9; Platelets 227   Lipid Panel    Component Value Date/Time   CHOL 121 11/12/2017 0530   TRIG 119 11/12/2017 0530   HDL 39 (L) 11/12/2017 0530   CHOLHDL 3.1 11/12/2017 0530   VLDL 24 11/12/2017 0530   LDLCALC 58 11/12/2017 0530   LDLDIRECT 142.0 11/11/2016 1626    Additional studies/ records that were reviewed today include:   Stress test 11/12/17 IMPRESSION: 1. Large area of reversibility involving the mid and distal anterior, septal, and inferoapical walls, suspicious for inducible myocardial ischemia.  2. Mild left ventricular dilatation, without regional wall motion abnormality.  3. Left ventricular ejection fraction 59%  4. Non invasive risk stratification*: High  LEFT HEART CATH AND CORONARY ANGIOGRAPHY  10/2017  Conclusion     Prox LAD lesion is 10% stenosed. Nonobstructive CAD.  The left ventricular systolic function is normal.  LV end diastolic pressure is mildly elevated. LVEDP 18 mm Hg.  The left ventricular ejection fraction is 55-65% by visual estimate.  There is no aortic valve stenosis.   Continue aggressive preventive therapy.       ASSESSMENT & PLAN:    1. Non obstructive  CAD -False positive stress test. Cath with 10%pLAD. Continue ASA and statin.  2. HTN - BP elevated on prior visit and  today. Increase amlodipine to 10mg  qd. Continue Irbesartan 75mg  qd. He has follow up with PCP in 4-6 weeks.   3. OSA on CPAP - compliant  4. HLD -Continue statin. Followed by PCP    Medication Adjustments/Labs and Tests Ordered: Current medicines are reviewed at length with the patient today.  Concerns regarding medicines are outlined above.  Medication changes, Labs and Tests ordered today are listed in the Patient Instructions below. Patient Instructions  Medication Instructions:  Your physician has recommended you make the following change in your medication:  1.  INCREASE the Amlodipine to 10 mg daily   If you need a refill on your cardiac medications before your next appointment, please call your pharmacy.   Lab work: None ordered  If you have labs (blood work) drawn today and your tests are completely normal, you will receive your results only by: Marland Kitchen MyChart Message (if you have MyChart) OR . A paper copy in the mail If you have any lab test that is abnormal or we need to change your treatment, we will call you to review the results.  Testing/Procedures: None ordered   Follow-Up: At [facility], you and your health needs are our priority.  As part of our continuing mission to provide you with exceptional heart care, we have created designated Provider Care Teams.  These Care Teams include your primary Cardiologist (physician) and Advanced Practice Providers (APPs -  Physician Assistants and Nurse Practitioners) who all work together to provide you with the care you need, when you need it. You will need a follow up appointment in [place].   Any Other Special Instructions Will Be Listed Below (If Applicable).       Jarrett Soho, [place]  12/11/2017 8:35 AM    [facility] [address], [place], [place]  [address] Phone: [phone]; Fax: [phone]

## 2017-12-11 NOTE — Patient Instructions (Signed)
Medication Instructions:  Your physician has recommended you make the following change in your medication:  1.  INCREASE the Amlodipine to 10 mg daily   If you need a refill on your cardiac medications before your next appointment, please call your pharmacy.   Lab work: None ordered  If you have labs (blood work) drawn today and your tests are completely normal, you will receive your results only by: Marland Kitchen MyChart Message (if you have MyChart) OR . A paper copy in the mail If you have any lab test that is abnormal or we need to change your treatment, we will call you to review the results.  Testing/Procedures: None ordered   Follow-Up: At [facility], you and your health needs are our priority.  As part of our continuing mission to provide you with exceptional heart care, we have created designated Provider Care Teams.  These Care Teams include your primary Cardiologist (physician) and Advanced Practice Providers (APPs -  Physician Assistants and Nurse Practitioners) who all work together to provide you with the care you need, when you need it. You will need a follow up appointment in [place].   Any Other Special Instructions Will Be Listed Below (If Applicable).

## 2017-12-13 ENCOUNTER — Other Ambulatory Visit: Payer: Self-pay | Admitting: Internal Medicine

## 2017-12-13 DIAGNOSIS — E785 Hyperlipidemia, unspecified: Secondary | ICD-10-CM

## 2017-12-13 DIAGNOSIS — R350 Frequency of micturition: Secondary | ICD-10-CM

## 2017-12-19 DIAGNOSIS — M7712 Lateral epicondylitis, left elbow: Secondary | ICD-10-CM | POA: Diagnosis not present

## 2017-12-29 ENCOUNTER — Encounter: Payer: Self-pay | Admitting: Family Medicine

## 2017-12-31 ENCOUNTER — Encounter: Payer: Self-pay | Admitting: Family Medicine

## 2017-12-31 ENCOUNTER — Other Ambulatory Visit: Payer: Self-pay | Admitting: Internal Medicine

## 2017-12-31 DIAGNOSIS — E785 Hyperlipidemia, unspecified: Secondary | ICD-10-CM

## 2017-12-31 MED ORDER — TRAMADOL HCL 50 MG PO TABS: 50.0000 mg | ORAL_TABLET | Freq: Three times a day (TID) | ORAL | 0 refills | Status: DC | PRN

## 2018-01-08 ENCOUNTER — Other Ambulatory Visit: Payer: Self-pay

## 2018-01-08 ENCOUNTER — Other Ambulatory Visit: Payer: Self-pay | Admitting: Internal Medicine

## 2018-01-08 ENCOUNTER — Encounter: Payer: Self-pay | Admitting: Family Medicine

## 2018-01-08 DIAGNOSIS — R350 Frequency of micturition: Secondary | ICD-10-CM

## 2018-01-08 MED ORDER — DUTASTERIDE 0.5 MG PO CAPS: 0.5000 mg | ORAL_CAPSULE | Freq: Every day | ORAL | 1 refills | Status: DC

## 2018-01-09 ENCOUNTER — Encounter: Payer: Self-pay | Admitting: Family Medicine

## 2018-01-10 ENCOUNTER — Ambulatory Visit (INDEPENDENT_AMBULATORY_CARE_PROVIDER_SITE_OTHER): Payer: Medicare HMO | Admitting: Family Medicine

## 2018-01-10 ENCOUNTER — Encounter: Payer: Self-pay | Admitting: Family Medicine

## 2018-01-10 VITALS — BP 140/82 | HR 106 | Temp 98.4°F | Wt 217.0 lb

## 2018-01-10 DIAGNOSIS — J019 Acute sinusitis, unspecified: Secondary | ICD-10-CM

## 2018-01-10 MED ORDER — DOXYCYCLINE HYCLATE 100 MG PO TABS: 100.0000 mg | ORAL_TABLET | Freq: Two times a day (BID) | ORAL | 0 refills | Status: DC

## 2018-01-10 NOTE — Progress Notes (Signed)
BP 140/82   Pulse (!) 106   Temp 98.4 F (36.9 C) (Oral)   Wt 217 lb (98.4 kg)   SpO2 98%   BMI 30.27 kg/m    CC: sinus congestion Subjective:    Patient ID: Dylan [name], male    DOB: [birth year], 67 y.o.   MRN: [id number]  HPI: Dylan [name] is a 67 y.o. male presenting on 01/10/2018 for Sinusitis (nasal congestion, sneezing.... x 2 day... pt believes he used his CPAP after not using for a month and it was dirty...Marland Kitchen  pt has upcoming surgery next week and wanted to make sure he did not need an Ax)   2 d h/o sneezing, nasal drainage, ST, productive cough, and headache. Initial chills. Head > chest congestion.   No fever, ear or tooth pain, dyspnea or wheezing.   Treating with dimetapp, chewable vit C with benefit. Tried tylenol as well.   Symptoms started after using CPAP that he hadn't cleaned in months.   Upcoming elbow surgery this week (avoiding NSAIDs due to this).   No sick contacts at home.  No smokers at home.  No h/o asthma.   Relevant past medical, surgical, family and social history reviewed and updated as indicated. Interim medical history since our last visit reviewed. Allergies and medications reviewed and updated. Outpatient Medications Prior to Visit  Medication Sig Dispense Refill  . amLODipine (NORVASC) 10 MG tablet Take 1 tablet (10 mg total) by mouth daily. 90 tablet 3  . aspirin EC 81 MG tablet Take 1 tablet (81 mg total) by mouth daily.    Marland Kitchen co-enzyme Q-10 30 MG capsule Take 30 mg by mouth daily.    Marland Kitchen dutasteride (AVODART) 0.5 MG capsule Take 1 capsule (0.5 mg total) by mouth daily. 90 capsule 1  . esomeprazole (NEXIUM) 40 MG capsule TAKE ONE CAPSULE BY MOUTH EVERY DAY BEFORE BREAKFAST 90 capsule 1  . irbesartan (AVAPRO) 75 MG tablet Take 75 mg by mouth daily.  1  . lidocaine (LIDODERM) 5 % Place 1 patch onto the skin daily. May wear up to 12 hours  0  . Melatonin 5 MG TABS Take 1 tablet by mouth at bedtime.     . mupirocin ointment (BACTROBAN) 2  % Apply 1 application topically 2 (two) times daily. 30 g 0  . naloxone (NARCAN) nasal spray 4 mg/0.1 mL Use as directed 2 kit 3  . nitroGLYCERIN (NITROSTAT) 0.4 MG SL tablet Place 1 tablet (0.4 mg total) under the tongue every 5 (five) minutes x 3 doses as needed for chest pain. 25 tablet 12  . nortriptyline (PAMELOR) 10 MG capsule Take 10 mg by mouth at bedtime.    . rosuvastatin (CRESTOR) 10 MG tablet TAKE 1 TABLET BY MOUTH EVERY DAY 90 tablet 1  . tamsulosin (FLOMAX) 0.4 MG CAPS capsule Take 0.4 mg by mouth.    . zolpidem (AMBIEN) 10 MG tablet TAKE 1 TABLET BY MOUTH EVERY DAY AT BEDTIME AS NEEDED 30 tablet 2  . traMADol (ULTRAM) 50 MG tablet Take 1 tablet (50 mg total) by mouth every 8 (eight) hours as needed. 90 tablet 0   No facility-administered medications prior to visit.      Per HPI unless specifically indicated in ROS section below Review of Systems     Objective:    BP 140/82   Pulse (!) 106   Temp 98.4 F (36.9 C) (Oral)   Wt 217 lb (98.4 kg)   SpO2 98%  BMI 30.27 kg/m   Wt Readings from Last 3 Encounters:  01/10/18 217 lb (98.4 kg)  12/11/17 228 lb 3.2 oz (103.5 kg)  11/14/17 222 lb 1.6 oz (100.7 kg)    Physical Exam  Constitutional: He appears well-developed and well-nourished. No distress.  HENT:  Head: Normocephalic and atraumatic.  Right Ear: Hearing, tympanic membrane, external ear and ear canal normal.  Left Ear: Hearing, tympanic membrane, external ear and ear canal normal.  Nose: Mucosal edema present. No rhinorrhea. Right sinus exhibits maxillary sinus tenderness. Right sinus exhibits no frontal sinus tenderness. Left sinus exhibits maxillary sinus tenderness. Left sinus exhibits no frontal sinus tenderness.  Mouth/Throat: Uvula is midline, oropharynx is clear and moist and mucous membranes are normal. No oropharyngeal exudate, posterior oropharyngeal edema, posterior oropharyngeal erythema or tonsillar abscesses.  Eyes: Pupils are equal, round, and  reactive to light. Conjunctivae and EOM are normal. No scleral icterus.  Neck: Normal range of motion. Neck supple.  Cardiovascular: Normal rate, regular rhythm, normal heart sounds and intact distal pulses.  No murmur heard. Pulmonary/Chest: Effort normal and breath sounds normal. No respiratory distress. He has no wheezes. He has no rales.  Lungs clear  Lymphadenopathy:    He has no cervical adenopathy.  Skin: Skin is warm and dry. No rash noted.  Nursing note and vitals reviewed.      Assessment & Plan:   Problem List Items Addressed This Visit    Acute sinusitis - Primary    Short duration however concerning contaminated CPAP exposure - will cover with doxycycline 10d course. Update if not improving with treatment.       Relevant Medications   doxycycline (VIBRA-TABS) 100 MG tablet       Meds ordered this encounter  Medications  . doxycycline (VIBRA-TABS) 100 MG tablet    Sig: Take 1 tablet (100 mg total) by mouth 2 (two) times daily.    Dispense:  20 tablet    Refill:  0   No orders of the defined types were placed in this encounter.   Follow up plan: Return if symptoms worsen or fail to improve.  Dylan Bush, MD

## 2018-01-10 NOTE — Patient Instructions (Signed)
I think you may have developing sinus infection - treat with doxycycline antibiotic sent to pharmacy.  Push fluids and plenty of rest.  Nasal saline irrigation or neti pot to help drain sinuses. May use plain mucinex with plenty of fluid to help mobilize mucous. Please let us know if fever >101.5, trouble opening/closing mouth, difficulty swallowing, or worsening instead of improving as expected.

## 2018-01-10 NOTE — Assessment & Plan Note (Signed)
Short duration however concerning contaminated CPAP exposure - will cover with doxycycline 10d course. Update if not improving with treatment.

## 2018-01-11 ENCOUNTER — Encounter: Payer: Self-pay | Admitting: Family Medicine

## 2018-01-12 ENCOUNTER — Encounter: Payer: Self-pay | Admitting: Family Medicine

## 2018-01-12 DIAGNOSIS — R3912 Poor urinary stream: Secondary | ICD-10-CM | POA: Diagnosis not present

## 2018-01-12 DIAGNOSIS — N401 Enlarged prostate with lower urinary tract symptoms: Secondary | ICD-10-CM | POA: Diagnosis not present

## 2018-01-13 ENCOUNTER — Encounter: Payer: Self-pay | Admitting: Family Medicine

## 2018-01-14 ENCOUNTER — Telehealth: Payer: Self-pay | Admitting: Family Medicine

## 2018-01-14 DIAGNOSIS — M7711 Lateral epicondylitis, right elbow: Secondary | ICD-10-CM | POA: Diagnosis not present

## 2018-01-14 DIAGNOSIS — G5601 Carpal tunnel syndrome, right upper limb: Secondary | ICD-10-CM | POA: Diagnosis not present

## 2018-01-14 NOTE — Telephone Encounter (Signed)
Absolutely not.  Pt has a Psychologist, sport and exercise for management of his post-op pain.  It is not a violation of his pain contract to have post op pain control at the discretion of the surgeon.  And based on the amount of back and forth on this issue, I will shortly be referring him to pain management to handle this going forward.

## 2018-01-14 NOTE — Telephone Encounter (Signed)
Patient is requesting that the pain medication come from PCP- he has medication contract with her.

## 2018-01-14 NOTE — Telephone Encounter (Unsigned)
Copied from [place] [id number]. Topic: Quick Communication - See Telephone Encounter >> Jan 14, 2018  8:28 AM Hewitt Shorts wrote: Pt is having surgery on his elbow today and pt has a contract with Dr. Birdie Riddle about pain medicaiton and they are they are requesting that it be called in so he has it after surgery   Best number [phone]  [facility]

## 2018-01-15 NOTE — Telephone Encounter (Signed)
Called and advised pt wife of PCP notes. She stated an understanding and will contact ortho.

## 2018-01-19 ENCOUNTER — Encounter: Payer: Self-pay | Admitting: Family Medicine

## 2018-01-30 ENCOUNTER — Other Ambulatory Visit: Payer: Self-pay | Admitting: Urology

## 2018-01-30 DIAGNOSIS — N401 Enlarged prostate with lower urinary tract symptoms: Secondary | ICD-10-CM | POA: Diagnosis not present

## 2018-01-30 DIAGNOSIS — R3912 Poor urinary stream: Secondary | ICD-10-CM | POA: Diagnosis not present

## 2018-02-04 ENCOUNTER — Encounter: Payer: Self-pay | Admitting: Family Medicine

## 2018-02-04 DIAGNOSIS — M25621 Stiffness of right elbow, not elsewhere classified: Secondary | ICD-10-CM | POA: Diagnosis not present

## 2018-02-09 ENCOUNTER — Other Ambulatory Visit: Payer: Self-pay | Admitting: Internal Medicine

## 2018-02-09 DIAGNOSIS — E785 Hyperlipidemia, unspecified: Secondary | ICD-10-CM

## 2018-02-10 ENCOUNTER — Other Ambulatory Visit: Payer: Self-pay | Admitting: Internal Medicine

## 2018-02-10 DIAGNOSIS — E785 Hyperlipidemia, unspecified: Secondary | ICD-10-CM

## 2018-02-11 DIAGNOSIS — M25629 Stiffness of unspecified elbow, not elsewhere classified: Secondary | ICD-10-CM | POA: Diagnosis not present

## 2018-02-13 DIAGNOSIS — M25621 Stiffness of right elbow, not elsewhere classified: Secondary | ICD-10-CM | POA: Diagnosis not present

## 2018-02-16 ENCOUNTER — Ambulatory Visit: Payer: Medicare HMO | Admitting: Neurology

## 2018-02-16 DIAGNOSIS — M25629 Stiffness of unspecified elbow, not elsewhere classified: Secondary | ICD-10-CM | POA: Diagnosis not present

## 2018-02-19 ENCOUNTER — Ambulatory Visit (HOSPITAL_BASED_OUTPATIENT_CLINIC_OR_DEPARTMENT_OTHER): Admit: 2018-02-19 | Payer: Medicare HMO | Admitting: Urology

## 2018-02-19 ENCOUNTER — Encounter (HOSPITAL_BASED_OUTPATIENT_CLINIC_OR_DEPARTMENT_OTHER): Payer: Self-pay

## 2018-02-19 SURGERY — TURP (TRANSURETHRAL RESECTION OF PROSTATE)
Anesthesia: General

## 2018-02-20 ENCOUNTER — Encounter: Payer: Self-pay | Admitting: Family Medicine

## 2018-02-20 ENCOUNTER — Ambulatory Visit (INDEPENDENT_AMBULATORY_CARE_PROVIDER_SITE_OTHER): Payer: Medicare HMO | Admitting: Family Medicine

## 2018-02-20 VITALS — BP 128/74 | HR 94 | Temp 98.0°F | Resp 16 | Ht 71.0 in | Wt 227.0 lb

## 2018-02-20 DIAGNOSIS — H60502 Unspecified acute noninfective otitis externa, left ear: Secondary | ICD-10-CM

## 2018-02-20 MED ORDER — NEOMYCIN-POLYMYXIN-HC 3.5-10000-1 OT SUSP: 4.0000 [drp] | Freq: Three times a day (TID) | OTIC | 0 refills | Status: DC

## 2018-02-20 NOTE — Progress Notes (Signed)
   Subjective:    Patient ID: Dylan [name], male    DOB: 03-Sep-1950, 67 y.o.   MRN: [id number]  HPI Ear pressure- L ear, pt reports ear started itching 12/24, 'came on real quick'.  Just started wearing hearing aides.  Tried OTC Swimmer's Ear product w/o relief.  Pain w/ chewing, 'it's just a lot pressure'.  No drainage.   Review of Systems For ROS see HPI     Objective:   Physical Exam Vitals signs reviewed.  Constitutional:      General: He is not in acute distress.    Appearance: Normal appearance.  HENT:     Head: Normocephalic and atraumatic.     Comments: L external ear canal swollen, red, sides nearly touching Pain w/ manipulation of pinna    Right Ear: Tympanic membrane and ear canal normal.  Neurological:     Mental Status: He is alert.           Assessment & Plan:  Otitis externa- new.  Start Cortisporin drops.  Reviewed supportive care and red flags that should prompt return.  Pt expressed understanding and is in agreement w/ plan.

## 2018-02-20 NOTE — Patient Instructions (Signed)
Follow up as needed or as scheduled START the Cortisporin ear drops- 4 drops 3x/day x7 days Try and keep ear dry and avoid hearing aides until ear feels better Call with any questions or concerns Happy New Year!

## 2018-02-22 ENCOUNTER — Other Ambulatory Visit: Payer: Self-pay | Admitting: Neurology

## 2018-02-23 DIAGNOSIS — M25621 Stiffness of right elbow, not elsewhere classified: Secondary | ICD-10-CM | POA: Diagnosis not present

## 2018-02-24 DIAGNOSIS — M7711 Lateral epicondylitis, right elbow: Secondary | ICD-10-CM | POA: Diagnosis not present

## 2018-02-24 DIAGNOSIS — M7712 Lateral epicondylitis, left elbow: Secondary | ICD-10-CM | POA: Diagnosis not present

## 2018-02-24 DIAGNOSIS — Z5189 Encounter for other specified aftercare: Secondary | ICD-10-CM | POA: Diagnosis not present

## 2018-02-24 DIAGNOSIS — G5601 Carpal tunnel syndrome, right upper limb: Secondary | ICD-10-CM | POA: Diagnosis not present

## 2018-03-05 ENCOUNTER — Other Ambulatory Visit: Payer: Self-pay | Admitting: Family Medicine

## 2018-03-05 ENCOUNTER — Encounter: Payer: Self-pay | Admitting: Family Medicine

## 2018-03-05 DIAGNOSIS — G4700 Insomnia, unspecified: Secondary | ICD-10-CM

## 2018-03-05 DIAGNOSIS — G4730 Sleep apnea, unspecified: Principal | ICD-10-CM

## 2018-03-05 DIAGNOSIS — M25629 Stiffness of unspecified elbow, not elsewhere classified: Secondary | ICD-10-CM | POA: Diagnosis not present

## 2018-03-05 DIAGNOSIS — E785 Hyperlipidemia, unspecified: Secondary | ICD-10-CM

## 2018-03-05 MED ORDER — ESOMEPRAZOLE MAGNESIUM 40 MG PO CPDR: DELAYED_RELEASE_CAPSULE | ORAL | 1 refills | Status: DC

## 2018-03-05 MED ORDER — ROSUVASTATIN CALCIUM 10 MG PO TABS: 10.0000 mg | ORAL_TABLET | Freq: Every day | ORAL | 1 refills | Status: DC

## 2018-03-05 NOTE — Telephone Encounter (Signed)
Last OV 02/20/18 Zolpidem last filled 12/11/17 #30 with 2

## 2018-03-05 NOTE — Addendum Note (Signed)
Addended by: Davis Gourd on: 03/05/2018 03:35 PM   Modules accepted: Orders

## 2018-03-09 DIAGNOSIS — M25629 Stiffness of unspecified elbow, not elsewhere classified: Secondary | ICD-10-CM | POA: Diagnosis not present

## 2018-03-16 DIAGNOSIS — M25621 Stiffness of right elbow, not elsewhere classified: Secondary | ICD-10-CM | POA: Diagnosis not present

## 2018-03-19 DIAGNOSIS — M25631 Stiffness of right wrist, not elsewhere classified: Secondary | ICD-10-CM | POA: Diagnosis not present

## 2018-03-21 ENCOUNTER — Other Ambulatory Visit: Payer: Self-pay | Admitting: Neurology

## 2018-03-23 DIAGNOSIS — M25629 Stiffness of unspecified elbow, not elsewhere classified: Secondary | ICD-10-CM | POA: Diagnosis not present

## 2018-03-31 ENCOUNTER — Encounter: Payer: Self-pay | Admitting: Family Medicine

## 2018-03-31 DIAGNOSIS — M150 Primary generalized (osteo)arthritis: Principal | ICD-10-CM

## 2018-03-31 DIAGNOSIS — M159 Polyosteoarthritis, unspecified: Secondary | ICD-10-CM

## 2018-03-31 DIAGNOSIS — M5412 Radiculopathy, cervical region: Secondary | ICD-10-CM

## 2018-03-31 DIAGNOSIS — M4692 Unspecified inflammatory spondylopathy, cervical region: Secondary | ICD-10-CM

## 2018-04-02 MED ORDER — HYDROCODONE-ACETAMINOPHEN 10-325 MG PO TABS: 1.0000 | ORAL_TABLET | Freq: Four times a day (QID) | ORAL | 0 refills | Status: DC | PRN

## 2018-04-03 ENCOUNTER — Encounter: Payer: Self-pay | Admitting: Family Medicine

## 2018-04-03 ENCOUNTER — Ambulatory Visit: Payer: Medicare HMO | Admitting: Family Medicine

## 2018-04-10 ENCOUNTER — Encounter: Payer: Self-pay | Admitting: Family Medicine

## 2018-04-10 ENCOUNTER — Ambulatory Visit (INDEPENDENT_AMBULATORY_CARE_PROVIDER_SITE_OTHER): Payer: Medicare HMO | Admitting: Family Medicine

## 2018-04-10 ENCOUNTER — Other Ambulatory Visit: Payer: Self-pay

## 2018-04-10 VITALS — BP 123/83 | HR 89 | Temp 98.4°F | Resp 16 | Ht 71.0 in | Wt 220.2 lb

## 2018-04-10 DIAGNOSIS — E669 Obesity, unspecified: Secondary | ICD-10-CM

## 2018-04-10 DIAGNOSIS — I10 Essential (primary) hypertension: Secondary | ICD-10-CM

## 2018-04-10 DIAGNOSIS — M25552 Pain in left hip: Secondary | ICD-10-CM

## 2018-04-10 DIAGNOSIS — E785 Hyperlipidemia, unspecified: Secondary | ICD-10-CM

## 2018-04-10 DIAGNOSIS — Z79891 Long term (current) use of opiate analgesic: Secondary | ICD-10-CM | POA: Diagnosis not present

## 2018-04-10 NOTE — Assessment & Plan Note (Signed)
Ongoing issue for pt.  Applauded his 7 lb weight loss.  Stressed need for healthy diet and regular exercise.  Will follow.

## 2018-04-10 NOTE — Assessment & Plan Note (Signed)
Chronic problem, well controlled today.  Asymptomatic.  Check labs.  No anticipated med changes. 

## 2018-04-10 NOTE — Assessment & Plan Note (Signed)
Ongoing issue for pt.  Due to chronic back, neck, and now hip pain.  Referral made to ortho for hip evaluation.  UDS done today.  Will continue to follow.

## 2018-04-10 NOTE — Patient Instructions (Addendum)
Schedule your complete physical in 6 months We'll notify you of your lab results and make any changes if needed Continue to work on healthy diet and regular exercise- you can do it! We'll call you with your Ortho appt for the hip pain Call with any questions or concerns Have a great weekend!

## 2018-04-10 NOTE — Assessment & Plan Note (Signed)
Chronic problem.  Tolerating statin w/o difficulty.  Check labs.  Adjust meds prn  

## 2018-04-10 NOTE — Progress Notes (Signed)
   Subjective:    Patient ID: Dylan [name], male    DOB: 02/06/1951, 68 y.o.   MRN: [id number]  HPI HTN- chronic problem, on Amlodipine 10mg , Avapro 75mg  w/ good control.  No CP, SOB, HAs, visual changes  Hyperlipidemia- chronic problem, on Crestor 10mg  daily.  No abd pain, N/V.  Opiate use- pt is due for UDS given chronic use of narcotics.  Pt had surgery to address elbow and arm issues but still requires Hydrocodone for chronic back pain.  Pt is unable to have back or hip surgery b/c he is taking care of his wife who has cancer.  Obesity- pt is down 7 lbs, walking regularly.   Review of Systems For ROS see HPI     Objective:   Physical Exam Vitals signs reviewed.  Constitutional:      General: He is not in acute distress.    Appearance: He is well-developed. He is obese.  HENT:     Head: Normocephalic and atraumatic.  Eyes:     Conjunctiva/sclera: Conjunctivae normal.     Pupils: Pupils are equal, round, and reactive to light.  Neck:     Musculoskeletal: Normal range of motion and neck supple.     Thyroid: No thyromegaly.  Cardiovascular:     Rate and Rhythm: Normal rate and regular rhythm.     Heart sounds: Normal heart sounds. No murmur.  Pulmonary:     Effort: Pulmonary effort is normal. No respiratory distress.     Breath sounds: Normal breath sounds.  Abdominal:     General: Bowel sounds are normal. There is no distension.     Palpations: Abdomen is soft.  Lymphadenopathy:     Cervical: No cervical adenopathy.  Skin:    General: Skin is warm and dry.  Neurological:     Mental Status: He is alert and oriented to person, place, and time.     Cranial Nerves: No cranial nerve deficit.  Psychiatric:        Behavior: Behavior normal.           Assessment & Plan:

## 2018-04-11 LAB — HEPATIC FUNCTION PANEL
AG Ratio: 2 (calc) (ref 1.0–2.5)
ALT: 12 U/L (ref 9–46)
AST: 15 U/L (ref 10–35)
Albumin: 4.5 g/dL (ref 3.6–5.1)
Alkaline phosphatase (APISO): 43 U/L (ref 35–144)
BILIRUBIN DIRECT: 0.1 mg/dL (ref 0.0–0.2)
BILIRUBIN TOTAL: 0.4 mg/dL (ref 0.2–1.2)
Globulin: 2.3 g/dL (calc) (ref 1.9–3.7)
Indirect Bilirubin: 0.3 mg/dL (calc) (ref 0.2–1.2)
Total Protein: 6.8 g/dL (ref 6.1–8.1)

## 2018-04-11 LAB — CBC WITH DIFFERENTIAL/PLATELET
Absolute Monocytes: 562 cells/uL (ref 200–950)
Basophils Absolute: 52 cells/uL (ref 0–200)
Basophils Relative: 0.7 %
Eosinophils Absolute: 148 cells/uL (ref 15–500)
Eosinophils Relative: 2 %
HCT: 37.8 % — ABNORMAL LOW (ref 38.5–50.0)
Hemoglobin: 12.8 g/dL — ABNORMAL LOW (ref 13.2–17.1)
Lymphs Abs: 3545 cells/uL (ref 850–3900)
MCH: 29.8 pg (ref 27.0–33.0)
MCHC: 33.9 g/dL (ref 32.0–36.0)
MCV: 87.9 fL (ref 80.0–100.0)
MPV: 10.1 fL (ref 7.5–12.5)
Monocytes Relative: 7.6 %
NEUTROS PCT: 41.8 %
Neutro Abs: 3093 cells/uL (ref 1500–7800)
Platelets: 326 10*3/uL (ref 140–400)
RBC: 4.3 10*6/uL (ref 4.20–5.80)
RDW: 12.9 % (ref 11.0–15.0)
Total Lymphocyte: 47.9 %
WBC: 7.4 10*3/uL (ref 3.8–10.8)

## 2018-04-11 LAB — BASIC METABOLIC PANEL
BUN: 12 mg/dL (ref 7–25)
CO2: 26 mmol/L (ref 20–32)
Calcium: 9.5 mg/dL (ref 8.6–10.3)
Chloride: 100 mmol/L (ref 98–110)
Creat: 0.86 mg/dL (ref 0.70–1.25)
Glucose, Bld: 99 mg/dL (ref 65–99)
POTASSIUM: 4.6 mmol/L (ref 3.5–5.3)
Sodium: 137 mmol/L (ref 135–146)

## 2018-04-11 LAB — LIPID PANEL
CHOL/HDL RATIO: 3.3 (calc) (ref <5.0)
CHOLESTEROL: 150 mg/dL (ref <200)
HDL: 46 mg/dL (ref >=40)
LDL Cholesterol (Calc): 83 mg/dL (calc)
Non-HDL Cholesterol (Calc): 104 mg/dL (calc) (ref <130)
Triglycerides: 110 mg/dL (ref <150)

## 2018-04-11 LAB — TSH: TSH: 1.15 mIU/L (ref 0.40–4.50)

## 2018-04-13 ENCOUNTER — Encounter: Payer: Self-pay | Admitting: General Practice

## 2018-04-15 NOTE — Telephone Encounter (Signed)
Spoke with Santiago Glad from [place]. She was going to email the [facility] lab to see what is taking so long, usually a 72 hr turn around time.

## 2018-04-15 NOTE — Telephone Encounter (Signed)
-----   Message from [name], MD sent at 04/15/2018  7:36 AM EST ----- Can you please check on this?  Thank you! KT ----- Message ----- From: SYSTEM Sent: 04/15/2018  12:04 AM EST To: [name], MD

## 2018-04-16 LAB — PAIN MGMT, PROFILE 8 W/CONF, U
6 Acetylmorphine: NEGATIVE ng/mL (ref <10)
Alcohol Metabolites: NEGATIVE ng/mL (ref <500)
Amphetamines: NEGATIVE ng/mL (ref <500)
Benzodiazepines: NEGATIVE ng/mL (ref <100)
Buprenorphine, Urine: NEGATIVE ng/mL (ref <5)
Cocaine Metabolite: NEGATIVE ng/mL (ref <150)
Codeine: NEGATIVE ng/mL (ref <50)
Creatinine: 72.4 mg/dL
HYDROCODONE: 1080 ng/mL — AB (ref <50)
Hydromorphone: 291 ng/mL — ABNORMAL HIGH (ref <50)
MDMA: NEGATIVE ng/mL (ref <500)
Marijuana Metabolite: NEGATIVE ng/mL (ref <20)
Morphine: NEGATIVE ng/mL (ref <50)
Norhydrocodone: 1571 ng/mL — ABNORMAL HIGH (ref <50)
Opiates: POSITIVE ng/mL — AB (ref <100)
Oxidant: NEGATIVE ug/mL (ref <200)
Oxycodone: NEGATIVE ng/mL (ref <100)
pH: 6 (ref 4.5–9.0)

## 2018-04-21 ENCOUNTER — Other Ambulatory Visit: Payer: Self-pay

## 2018-04-21 ENCOUNTER — Ambulatory Visit (INDEPENDENT_AMBULATORY_CARE_PROVIDER_SITE_OTHER): Payer: Medicare HMO | Admitting: Family Medicine

## 2018-04-21 ENCOUNTER — Encounter: Payer: Self-pay | Admitting: Family Medicine

## 2018-04-21 VITALS — BP 121/74 | HR 104 | Temp 98.1°F | Resp 16 | Ht 71.0 in | Wt 216.4 lb

## 2018-04-21 DIAGNOSIS — L723 Sebaceous cyst: Secondary | ICD-10-CM | POA: Diagnosis not present

## 2018-04-21 MED ORDER — MUPIROCIN 2 % EX OINT: 1.0000 "application " | TOPICAL_OINTMENT | Freq: Two times a day (BID) | CUTANEOUS | 0 refills | Status: DC

## 2018-04-21 NOTE — Patient Instructions (Signed)
Follow up as needed or as scheduled Apply the Mupirocin twice daily as needed for redness or infection Apply warm compresses to shrink the area There is no current sign of infection so no oral antibiotics needed Call with any questions or concerns Hang in there!

## 2018-04-21 NOTE — Progress Notes (Signed)
   Subjective:    Patient ID: Dylan [name], male    DOB: 1950-11-27, 68 y.o.   MRN: [id number]  HPI Axillary infxn- noticed Saturday AM, bilateral.  Has been using Mupirocin ointment and areas have decreased in size.  No fevers.  No drainage from areas.  Denies pain.  Pt has hx of similar.   Review of Systems For ROS see HPI     Objective:   Physical Exam Vitals signs reviewed.  Constitutional:      General: He is not in acute distress.    Appearance: Normal appearance. He is not ill-appearing.  Skin:    General: Skin is warm and dry.     Findings: No erythema or rash.     Comments: R axilla 1 cm cyst w/o TTP, surrounding redness L axilla 0.5 cm cyst w/o TTP, surrounding redness  Neurological:     Mental Status: He is alert.           Assessment & Plan:  Axillary cysts- new.  Bilateral.  No evidence of infection.  No pain.  No need for abx.  Reviewed dx and supportive care.  Pt expressed understanding and is in agreement w/ plan.

## 2018-04-24 DIAGNOSIS — M25552 Pain in left hip: Secondary | ICD-10-CM | POA: Diagnosis not present

## 2018-04-24 DIAGNOSIS — M1612 Unilateral primary osteoarthritis, left hip: Secondary | ICD-10-CM | POA: Diagnosis not present

## 2018-04-24 DIAGNOSIS — M5136 Other intervertebral disc degeneration, lumbar region: Secondary | ICD-10-CM | POA: Diagnosis not present

## 2018-04-24 DIAGNOSIS — M7062 Trochanteric bursitis, left hip: Secondary | ICD-10-CM | POA: Diagnosis not present

## 2018-04-30 ENCOUNTER — Encounter: Payer: Self-pay | Admitting: Family Medicine

## 2018-04-30 DIAGNOSIS — M159 Polyosteoarthritis, unspecified: Secondary | ICD-10-CM

## 2018-04-30 DIAGNOSIS — M5412 Radiculopathy, cervical region: Secondary | ICD-10-CM

## 2018-04-30 DIAGNOSIS — M4692 Unspecified inflammatory spondylopathy, cervical region: Secondary | ICD-10-CM

## 2018-04-30 DIAGNOSIS — M150 Primary generalized (osteo)arthritis: Principal | ICD-10-CM

## 2018-05-01 MED ORDER — HYDROCODONE-ACETAMINOPHEN 10-325 MG PO TABS: 1.0000 | ORAL_TABLET | Freq: Four times a day (QID) | ORAL | 0 refills | Status: DC | PRN

## 2018-05-01 NOTE — Telephone Encounter (Signed)
Last OV 04/21/18 Hydrocodone last filled 04/02/18 #120 with 0

## 2018-05-05 DIAGNOSIS — G5601 Carpal tunnel syndrome, right upper limb: Secondary | ICD-10-CM | POA: Diagnosis not present

## 2018-05-05 DIAGNOSIS — M7711 Lateral epicondylitis, right elbow: Secondary | ICD-10-CM | POA: Diagnosis not present

## 2018-05-08 DIAGNOSIS — M25521 Pain in right elbow: Secondary | ICD-10-CM | POA: Diagnosis not present

## 2018-05-15 ENCOUNTER — Encounter: Payer: Self-pay | Admitting: Family Medicine

## 2018-05-23 ENCOUNTER — Telehealth: Payer: Medicare HMO | Admitting: Physician Assistant

## 2018-05-23 DIAGNOSIS — B9689 Other specified bacterial agents as the cause of diseases classified elsewhere: Secondary | ICD-10-CM | POA: Diagnosis not present

## 2018-05-23 DIAGNOSIS — J019 Acute sinusitis, unspecified: Secondary | ICD-10-CM | POA: Diagnosis not present

## 2018-05-23 MED ORDER — DOXYCYCLINE HYCLATE 100 MG PO CAPS: 100.0000 mg | ORAL_CAPSULE | Freq: Two times a day (BID) | ORAL | 0 refills | Status: AC

## 2018-05-23 NOTE — Progress Notes (Signed)
We are sorry that you are not feeling well.  Here is how we plan to help!  Based on what you have shared with me it looks like you have sinusitis.  Sinusitis is inflammation and infection in the sinus cavities of the head.  Based on your presentation I believe you most likely have Acute Bacterial Sinusitis.  This is an infection caused by bacteria and is treated with antibiotics. I have prescribed Doxycycline 100mg  by mouth twice a day for 10 days. You may use an oral decongestant such as Mucinex D or if you have glaucoma or high blood pressure use plain Mucinex. Saline nasal spray help and can safely be used as often as needed for congestion.  If you develop worsening sinus pain, fever or notice severe headache and vision changes, or if symptoms are not better after completion of antibiotic, please schedule an appointment with a health care provider.    Sinus infections are not as easily transmitted as other respiratory infection, however we still recommend that you avoid close contact with loved ones, especially the very young and elderly.  Remember to wash your hands thoroughly throughout the day as this is the number one way to prevent the spread of infection!  Home Care: Only take medications as instructed by your medical team. Complete the entire course of an antibiotic. Do not take these medications with alcohol. A steam or ultrasonic humidifier can help congestion.  You can place a towel over your head and breathe in the steam from hot water coming from a faucet. Avoid close contacts especially the very young and the elderly. Cover your mouth when you cough or sneeze. Always remember to wash your hands.  Get Help Right Away If: You develop worsening fever or sinus pain. You develop a severe head ache or visual changes. Your symptoms persist after you have completed your treatment plan.  Make sure you Understand these instructions. Will watch your condition. Will get help right away if  you are not doing well or get worse.  Your e-visit answers were reviewed by a board certified advanced clinical practitioner to complete your personal care plan.  Depending on the condition, your plan could have included both over the counter or prescription medications.  If there is a problem please reply once you have received a response from your provider.  Your safety is important to [place].  If you have drug allergies check your prescription carefully.    You can use MyChart to ask questions about today's visit, request a non-urgent call back, or ask for a work or school excuse for 24 hours related to this e-Visit. If it has been greater than 24 hours you will need to follow up with your provider, or enter a new e-Visit to address those concerns.  You will get an e-mail in the next two days asking about your experience.  I hope that your e-visit has been valuable and will speed your recovery. Thank you for using e-visits.    ===View-only below this line===   ----- Message -----    From: Jena Gauss    Sent: 05/23/2018 11:45 AM EDT      To: E-Visit Mailing List Subject: E-Visit Submission: Sinus Problems  E-Visit Submission: Sinus Problems --------------------------------  Question: Which of the following have you been experiencing? Answer:   Congested nose            Pain around the nose and face  Question: Have these symptoms significantly worsened over the last  two to three days? Answer:   Yes  Question: Have you had any of the following? Answer:   None of the above  Question: How long have you been having these symptoms? Answer:   7 or more days  Question: Do you have a fever? Answer:   No, I do not have a fever  Question: Do you smoke? Answer:   No  Question: Have you ever smoked? Answer:   I have never smoked  Question: Do you have any chronic illnesses, such as diabetes, heart disease, kidney disease, or lung disease, or any illness that would weaken your  body's ability to fight infection? Answer:   No  Question: When you blow your nose, what color is the mucus? Answer:   Mostly thick and yellow or green  Question: Have you experienced similar problems in the past? Answer:   Yes  Question: What treatments have worked in the past?  Answer:   antibiotics and saline nasal spray  Question: What treatment(s) in the past have been unsuccessful? Answer:   none  Question: Is this illness similar to previous illnesses you have had?  How is it the same?  How is it different? Answer:   yes chronic sinus problems  Question: Have you recently been hospitalized? Answer:   No  Question: What medications are you currently taking for these symptoms? Answer:   Decongestants            Nose spray  Question: Please enter the names of any medications you are taking, or any other treatments you are trying. Answer:   saline nasal spray, sudafed.  Question: Please list your medication allergies that you may have ? (If 'none' , please list as 'none') Answer:   penicillin, heparin,  Question: Please list any additional comments  Answer:     A total of 5-10 minutes was spent evaluating this patients questionnaire and formulating a plan of care.

## 2018-05-28 ENCOUNTER — Other Ambulatory Visit: Payer: Self-pay | Admitting: *Deleted

## 2018-05-28 ENCOUNTER — Encounter: Payer: Self-pay | Admitting: Family Medicine

## 2018-05-28 DIAGNOSIS — G4700 Insomnia, unspecified: Secondary | ICD-10-CM

## 2018-05-28 DIAGNOSIS — M159 Polyosteoarthritis, unspecified: Secondary | ICD-10-CM

## 2018-05-28 DIAGNOSIS — M150 Primary generalized (osteo)arthritis: Principal | ICD-10-CM

## 2018-05-28 DIAGNOSIS — M5412 Radiculopathy, cervical region: Secondary | ICD-10-CM

## 2018-05-28 DIAGNOSIS — M4692 Unspecified inflammatory spondylopathy, cervical region: Secondary | ICD-10-CM

## 2018-05-28 DIAGNOSIS — G4730 Sleep apnea, unspecified: Secondary | ICD-10-CM

## 2018-05-28 NOTE — Telephone Encounter (Signed)
Hydrocodone Last Filled: 05/01/2018 #120,0 Ambien Last Filled: 03/05/2018 #30, 2  Last OV: 04/10/2018

## 2018-05-29 ENCOUNTER — Other Ambulatory Visit: Payer: Self-pay | Admitting: Family Medicine

## 2018-05-29 MED ORDER — HYDROCODONE-ACETAMINOPHEN 10-325 MG PO TABS: 1.0000 | ORAL_TABLET | Freq: Four times a day (QID) | ORAL | 0 refills | Status: DC | PRN

## 2018-05-29 MED ORDER — ZOLPIDEM TARTRATE 10 MG PO TABS: 10.0000 mg | ORAL_TABLET | Freq: Every day | ORAL | 2 refills | Status: DC

## 2018-06-28 ENCOUNTER — Encounter: Payer: Self-pay | Admitting: Family Medicine

## 2018-06-28 DIAGNOSIS — G4700 Insomnia, unspecified: Secondary | ICD-10-CM

## 2018-06-28 DIAGNOSIS — M150 Primary generalized (osteo)arthritis: Principal | ICD-10-CM

## 2018-06-28 DIAGNOSIS — M4692 Unspecified inflammatory spondylopathy, cervical region: Secondary | ICD-10-CM

## 2018-06-28 DIAGNOSIS — G4730 Sleep apnea, unspecified: Secondary | ICD-10-CM

## 2018-06-28 DIAGNOSIS — M5412 Radiculopathy, cervical region: Secondary | ICD-10-CM

## 2018-06-28 DIAGNOSIS — M159 Polyosteoarthritis, unspecified: Secondary | ICD-10-CM

## 2018-06-29 MED ORDER — HYDROCODONE-ACETAMINOPHEN 10-325 MG PO TABS: 1.0000 | ORAL_TABLET | Freq: Four times a day (QID) | ORAL | 0 refills | Status: DC | PRN

## 2018-06-29 NOTE — Telephone Encounter (Signed)
Last OV 04/21/18 Hydrocodone last filled 05/29/18 #120 with 0 Ambien last filled 05/29/18 #30 with 2

## 2018-07-06 ENCOUNTER — Other Ambulatory Visit: Payer: Self-pay | Admitting: Family Medicine

## 2018-07-06 DIAGNOSIS — R350 Frequency of micturition: Secondary | ICD-10-CM

## 2018-07-13 ENCOUNTER — Encounter: Payer: Self-pay | Admitting: Family Medicine

## 2018-07-15 ENCOUNTER — Ambulatory Visit (INDEPENDENT_AMBULATORY_CARE_PROVIDER_SITE_OTHER): Payer: Medicare HMO | Admitting: Family Medicine

## 2018-07-15 ENCOUNTER — Encounter: Payer: Self-pay | Admitting: Family Medicine

## 2018-07-15 ENCOUNTER — Other Ambulatory Visit: Payer: Self-pay

## 2018-07-15 VITALS — BP 134/79 | HR 98 | Temp 97.7°F | Ht 72.0 in | Wt 202.0 lb

## 2018-07-15 DIAGNOSIS — R141 Gas pain: Secondary | ICD-10-CM | POA: Diagnosis not present

## 2018-07-15 NOTE — Progress Notes (Signed)
I have discussed the procedure for the virtual visit with the patient who has given consent to proceed with assessment and treatment.   abd pain started right after wife's recent hospitalization. Denies any change in appetite.  Davis Gourd, CMA

## 2018-07-15 NOTE — Progress Notes (Signed)
Virtual Visit via Video   I connected with patient on 07/15/18 at  2:40 PM EDT by a video enabled telemedicine application and verified that I am speaking with the correct person using two identifiers.  Location patient: Home Location provider: [facility]up[facility]ist, Office Persons participating in the virtual visit: Patient, Provider, E. Lopez (Jess B)  I discussed the limitations of evaluation and management by telemedicine and the availability of in person appointments. The patient expressed understanding and agreed to proceed.  Subjective:   HPI:   abd pain- pt reports he has 'a gas pain' that started ~6-8 weeks ago.  No change in appetite.  Pain improves w/ BM.  Increased gas.  Denies constipation.  Taking Pepto w/ temporary relief.  Denies GERD.  No nausea.  Pain is 'right below my belly button.  More L side than R'.  sxs improved w/ Gas-X.  Pt has lost 16 lbs since late Feb- walking an hour each day.  Pt reports eating large amounts of cheese.    ROS:   See pertinent positives and negatives per HPI.  Patient Active Problem List   Diagnosis Date Noted  . Obesity (BMI 30-39.9) 04/10/2018  . Abnormal stress test   . Cervical radiculopathy 08/14/2017  . Chronic use of opiate for therapeutic purpose 07/22/2017  . Ulnar neuropathy of both upper extremities 06/19/2017  . Numbness and tingling in both hands 06/16/2017  . Encounter for long-term opiate analgesic use 06/16/2017  . 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 Routine general medical examination at a health care facility 11/11/2016  . Oropharyngeal dysphagia 03/05/2016  . Lumbar stenosis with neurogenic claudication 09/12/2015  . Hypertension 05/10/2011  . BPH (benign prostatic hyperplasia) 08/18/2010  . OA (osteoarthritis) 11/15/2009  . CARPAL TUNNEL SYNDROME 08/07/2009  . SLEEP APNEA, OBSTRUCTIVE, MODERATE 03/22/2009  . Insomnia w/ sleep apnea 03/22/2009  . Hx of malignant melanoma 11/17/2008  .  Hyperlipidemia LDL goal <100 11/17/2008  . ALLERGIC RHINITIS 11/17/2008    Social History   Tobacco Use  . Smoking status: Never Smoker  . Smokeless tobacco: Former Systems developer    Types: Chew  Substance Use Topics  . Alcohol use: No    Current Outpatient Medications:  .  amLODipine (NORVASC) 10 MG tablet, Take 1 tablet (10 mg total) by mouth daily., Disp: 90 tablet, Rfl: 3 .  aspirin EC 81 MG tablet, Take 1 tablet (81 mg total) by mouth daily., Disp: , Rfl:  .  co-enzyme Q-10 30 MG capsule, Take 30 mg by mouth daily., Disp: , Rfl:  .  dutasteride (AVODART) 0.5 MG capsule, TAKE 1 CAPSULE BY MOUTH EVERY DAY, Disp: 90 capsule, Rfl: 1 .  esomeprazole (NEXIUM) 40 MG capsule, TAKE ONE CAPSULE BY MOUTH EVERY DAY BEFORE BREAKFAST, Disp: 90 capsule, Rfl: 1 .  HYDROcodone-acetaminophen (NORCO) 10-325 MG tablet, Take 1 tablet by mouth every 6 (six) hours as needed for severe pain., Disp: 120 tablet, Rfl: 0 .  irbesartan (AVAPRO) 75 MG tablet, TAKE 1 TABLET BY MOUTH EVERY DAY, Disp: 90 tablet, Rfl: 1 .  Melatonin 5 MG TABS, Take 1 tablet by mouth at bedtime. , Disp: , Rfl:  .  mupirocin ointment (BACTROBAN) 2 %, Apply 1 application topically 2 (two) times daily., Disp: 30 g, Rfl: 0 .  naloxone (NARCAN) nasal spray 4 mg/0.1 mL, Use as directed, Disp: 2 kit, Rfl: 3 .  nitroGLYCERIN (NITROSTAT) 0.4 MG SL tablet, Place 1 tablet (0.4 mg total) under the tongue every 5 (five) minutes  x 3 doses as needed for chest pain., Disp: 25 tablet, Rfl: 12 .  rosuvastatin (CRESTOR) 10 MG tablet, Take 1 tablet (10 mg total) by mouth daily., Disp: 90 tablet, Rfl: 1 .  tamsulosin (FLOMAX) 0.4 MG CAPS capsule, Take 0.4 mg by mouth., Disp: , Rfl:  .  zolpidem (AMBIEN) 10 MG tablet, Take 1 tablet (10 mg total) by mouth at bedtime., Disp: 30 tablet, Rfl: 2  Allergies  Allergen Reactions  . Heparin Anaphylaxis    REACTION: breathing problems  . Succinylcholine Chloride Other (See Comments)    REACTION: does not wake up from  anesthetic, pt. Reports that through testing that he was found  not to have the enzyme to breakdown the Succ.  . Vancomycin Hives and Other (See Comments)    Ran off a pump. So developed redman's . Running too fast  . Penicillins Rash    Objective:   BP 134/79   Pulse 98   Temp 97.7 F (36.5 C) (Oral)   Ht 6' (1.829 m)   Wt 202 lb (91.6 kg)   BMI 27.40 kg/m   AAOx3, NAD NCAT, EOMI No obvious CN deficits Coloring WNL Pt is able to speak clearly, coherently without shortness of breath or increased work of breathing.  Thought process is linear.  Mood is appropriate.   Assessment and Plan:   Gas/Bloating- new to provider, ongoing for pt x8 weeks.  Suspect this is due to his intake of cheese and dairy products and a dose dependent lactose intolerance.  Encouraged him to scale back on dairy and add GasX for improvement of sxs.  Pt expressed understanding and is in agreement w/ plan.    Annye Asa, MD 07/15/2018

## 2018-07-17 ENCOUNTER — Telehealth: Payer: Self-pay | Admitting: Family Medicine

## 2018-07-17 NOTE — Telephone Encounter (Signed)
LM asking pt to call back to schedule a 2wk reck on gas pain.

## 2018-07-27 ENCOUNTER — Encounter: Payer: Self-pay | Admitting: Family Medicine

## 2018-07-27 DIAGNOSIS — M159 Polyosteoarthritis, unspecified: Secondary | ICD-10-CM

## 2018-07-27 DIAGNOSIS — M4692 Unspecified inflammatory spondylopathy, cervical region: Secondary | ICD-10-CM

## 2018-07-27 MED ORDER — HYDROCODONE-ACETAMINOPHEN 10-325 MG PO TABS: 1.0000 | ORAL_TABLET | Freq: Four times a day (QID) | ORAL | 0 refills | Status: DC | PRN

## 2018-07-27 NOTE — Telephone Encounter (Signed)
Last OV 07/15/18 Hydrocodone last filled 06/29/18 #120 with 0

## 2018-07-27 NOTE — Telephone Encounter (Signed)
Please advise, pt's wife has called asking if pt can come in for an in office visit

## 2018-07-29 ENCOUNTER — Other Ambulatory Visit: Payer: Self-pay

## 2018-07-29 ENCOUNTER — Ambulatory Visit (INDEPENDENT_AMBULATORY_CARE_PROVIDER_SITE_OTHER): Payer: Medicare HMO | Admitting: Family Medicine

## 2018-07-29 ENCOUNTER — Encounter: Payer: Self-pay | Admitting: Family Medicine

## 2018-07-29 VITALS — BP 124/78 | HR 76 | Temp 98.0°F | Resp 17 | Wt 203.4 lb

## 2018-07-29 DIAGNOSIS — R1011 Right upper quadrant pain: Secondary | ICD-10-CM | POA: Diagnosis not present

## 2018-07-29 DIAGNOSIS — R140 Abdominal distension (gaseous): Secondary | ICD-10-CM | POA: Diagnosis not present

## 2018-07-29 DIAGNOSIS — L02412 Cutaneous abscess of left axilla: Secondary | ICD-10-CM

## 2018-07-29 LAB — BASIC METABOLIC PANEL
BUN: 20 mg/dL (ref 6–23)
CO2: 26 mEq/L (ref 19–32)
Calcium: 9.4 mg/dL (ref 8.4–10.5)
Chloride: 102 mEq/L (ref 96–112)
Creatinine, Ser: 1.06 mg/dL (ref 0.40–1.50)
GFR: 69.78 mL/min (ref >60.00)
Glucose, Bld: 98 mg/dL (ref 70–99)
Potassium: 4 mEq/L (ref 3.5–5.1)
Sodium: 138 mEq/L (ref 135–145)

## 2018-07-29 LAB — CBC WITH DIFFERENTIAL/PLATELET
Basophils Absolute: 0 10*3/uL (ref 0.0–0.1)
Basophils Relative: 0.3 % (ref 0.0–3.0)
Eosinophils Absolute: 0.2 10*3/uL (ref 0.0–0.7)
Eosinophils Relative: 1.9 % (ref 0.0–5.0)
HCT: 35.5 % — ABNORMAL LOW (ref 39.0–52.0)
Hemoglobin: 12.1 g/dL — ABNORMAL LOW (ref 13.0–17.0)
Lymphocytes Relative: 27.5 % (ref 12.0–46.0)
Lymphs Abs: 2.3 10*3/uL (ref 0.7–4.0)
MCHC: 34.2 g/dL (ref 30.0–36.0)
MCV: 90.6 fl (ref 78.0–100.0)
Monocytes Absolute: 0.6 10*3/uL (ref 0.1–1.0)
Monocytes Relative: 7.3 % (ref 3.0–12.0)
Neutro Abs: 5.3 10*3/uL (ref 1.4–7.7)
Neutrophils Relative %: 63 % (ref 43.0–77.0)
Platelets: 276 10*3/uL (ref 150.0–400.0)
RBC: 3.91 Mil/uL — ABNORMAL LOW (ref 4.22–5.81)
RDW: 14.1 % (ref 11.5–15.5)
WBC: 8.4 10*3/uL (ref 4.0–10.5)

## 2018-07-29 LAB — HEPATIC FUNCTION PANEL
ALT: 15 U/L (ref 0–53)
AST: 18 U/L (ref 0–37)
Albumin: 4.5 g/dL (ref 3.5–5.2)
Alkaline Phosphatase: 47 U/L (ref 39–117)
Bilirubin, Direct: 0.1 mg/dL (ref 0.0–0.3)
Total Bilirubin: 0.4 mg/dL (ref 0.2–1.2)
Total Protein: 6.9 g/dL (ref 6.0–8.3)

## 2018-07-29 LAB — LIPASE: Lipase: 20 U/L (ref 11.0–59.0)

## 2018-07-29 LAB — AMYLASE: Amylase: 29 U/L (ref 27–131)

## 2018-07-29 MED ORDER — DOXYCYCLINE HYCLATE 100 MG PO TABS: 100.0000 mg | ORAL_TABLET | Freq: Two times a day (BID) | ORAL | 0 refills | Status: DC

## 2018-07-29 NOTE — Progress Notes (Signed)
   Subjective:    Patient ID: Dylan [name], male    DOB: 12/13/1950, 68 y.o.   MRN: [id number]  HPI abd pain- pain is worse than previous.  Now R sided and radiates around to back.  Pain is now constant- radiates to back.  Some improvement w/ heat.  Increased gas.  No change in BM.  Decreased appetite but pain doesn't change w/ eating.  No N/V.  Pain described as 'a dull ache' that will 'progressively worsen'.  Pt's pain improves when 'holding pressure'.  sxs started 3-4 months ago.  Cyst under arm- reappeared on Monday.  Under L arm.  Started draining last night and today 'was able to squeeze a bunch out'.  'it's big'.  No fevers.  No other similar areas.    Review of Systems For ROS see HPI     Objective:   Physical Exam Vitals signs reviewed.  Constitutional:      General: He is not in acute distress.    Appearance: He is well-developed.  HENT:     Head: Normocephalic and atraumatic.  Abdominal:     General: Bowel sounds are normal.     Tenderness: There is abdominal tenderness in the right upper quadrant, epigastric area and periumbilical area. There is no guarding or rebound. Negative signs include Murphy's sign and McBurney's sign.  Skin:    General: Skin is warm and dry.     Findings: Erythema (overlying a 2x2cm round, indurated but draining abscess in L axilla) present.  Neurological:     Mental Status: He is alert.  Psychiatric:        Mood and Affect: Mood normal.           Assessment & Plan:  Axillary abscess- recurrent problem for pt.  He asks if there is another abx b/c the Doxy makes him sun sensitive and interferes w/ golfing.  I told him that given his hx of MRSA, our other option is Bactrim- which also causes sun sensitivity.  Pt agreeable to Doxy.  Abd pain- pt is in no distress during today's visit.  Indicates that this has been going on x3-4 months but has been worsening.  No change in bowel habits, no N/V but + decreased appetite.  Pt's comment about the  abx interfering with golf leads me to wonder how bad his abdominal pain really is.  Has TTP on exam over periumbilical region and RUQ.  Will get labs and attempt to get CT to assess.  Reviewed supportive care and red flags that should prompt return.  Pt expressed understanding and is in agreement w/ plan.

## 2018-07-29 NOTE — Patient Instructions (Signed)
Follow up as needed or as scheduled We'll notify you of your lab results and make any changes if needed START the Doxy twice daily for the infection in the armpit Hot compresses to help w/ infection We'll call you to schedule your CT scan Call with any questions or concerns Hang in there!

## 2018-07-30 ENCOUNTER — Other Ambulatory Visit: Payer: Self-pay | Admitting: Family Medicine

## 2018-07-30 DIAGNOSIS — R1033 Periumbilical pain: Secondary | ICD-10-CM

## 2018-08-14 ENCOUNTER — Ambulatory Visit
Admission: RE | Admit: 2018-08-14 | Discharge: 2018-08-14 | Disposition: A | Discharge status: Home / Self Care | Payer: Medicare HMO | Source: Ambulatory Visit | Attending: Family Medicine | Admitting: Family Medicine

## 2018-08-14 DIAGNOSIS — R1033 Periumbilical pain: Secondary | ICD-10-CM | POA: Diagnosis not present

## 2018-08-20 ENCOUNTER — Encounter: Payer: Self-pay | Admitting: Family Medicine

## 2018-08-23 ENCOUNTER — Other Ambulatory Visit: Payer: Self-pay | Admitting: Family Medicine

## 2018-08-23 DIAGNOSIS — E785 Hyperlipidemia, unspecified: Secondary | ICD-10-CM

## 2018-08-24 ENCOUNTER — Encounter: Payer: Self-pay | Admitting: Family Medicine

## 2018-08-24 DIAGNOSIS — M159 Polyosteoarthritis, unspecified: Secondary | ICD-10-CM

## 2018-08-24 DIAGNOSIS — M5412 Radiculopathy, cervical region: Secondary | ICD-10-CM

## 2018-08-24 DIAGNOSIS — M4692 Unspecified inflammatory spondylopathy, cervical region: Secondary | ICD-10-CM

## 2018-08-24 NOTE — Telephone Encounter (Signed)
Last OV 07/29/18 Hydrocodone last filled 07/27/18 #120 with 0

## 2018-08-25 MED ORDER — HYDROCODONE-ACETAMINOPHEN 10-325 MG PO TABS: 1.0000 | ORAL_TABLET | Freq: Four times a day (QID) | ORAL | 0 refills | Status: DC | PRN

## 2018-08-31 ENCOUNTER — Encounter: Payer: Self-pay | Admitting: Family Medicine

## 2018-08-31 DIAGNOSIS — G4700 Insomnia, unspecified: Secondary | ICD-10-CM

## 2018-08-31 MED ORDER — ZOLPIDEM TARTRATE 10 MG PO TABS: 10.0000 mg | ORAL_TABLET | Freq: Every day | ORAL | 2 refills | Status: DC

## 2018-08-31 NOTE — Telephone Encounter (Signed)
Last oV 07/29/18 ambien last filled 05/29/18 #30 with 2

## 2018-09-22 ENCOUNTER — Encounter: Payer: Self-pay | Admitting: Family Medicine

## 2018-09-22 DIAGNOSIS — M4692 Unspecified inflammatory spondylopathy, cervical region: Secondary | ICD-10-CM

## 2018-09-22 DIAGNOSIS — M159 Polyosteoarthritis, unspecified: Secondary | ICD-10-CM

## 2018-09-22 NOTE — Telephone Encounter (Signed)
Last OV 07/29/18 Hydrocodone last filled 08/25/18 #120 with 0

## 2018-09-23 ENCOUNTER — Encounter: Payer: Self-pay | Admitting: Family Medicine

## 2018-09-23 MED ORDER — HYDROCODONE-ACETAMINOPHEN 10-325 MG PO TABS: 1.0000 | ORAL_TABLET | Freq: Four times a day (QID) | ORAL | 0 refills | Status: DC | PRN

## 2018-10-19 ENCOUNTER — Other Ambulatory Visit: Payer: Self-pay

## 2018-10-19 ENCOUNTER — Ambulatory Visit (INDEPENDENT_AMBULATORY_CARE_PROVIDER_SITE_OTHER): Payer: Medicare HMO | Admitting: Family Medicine

## 2018-10-19 ENCOUNTER — Encounter: Payer: Self-pay | Admitting: Family Medicine

## 2018-10-19 VITALS — BP 142/78 | HR 78 | Temp 97.9°F | Resp 16 | Ht 72.0 in | Wt 211.4 lb

## 2018-10-19 DIAGNOSIS — Z0000 Encounter for general adult medical examination without abnormal findings: Secondary | ICD-10-CM

## 2018-10-19 DIAGNOSIS — N401 Enlarged prostate with lower urinary tract symptoms: Secondary | ICD-10-CM | POA: Diagnosis not present

## 2018-10-19 DIAGNOSIS — I10 Essential (primary) hypertension: Secondary | ICD-10-CM

## 2018-10-19 DIAGNOSIS — Z23 Encounter for immunization: Secondary | ICD-10-CM

## 2018-10-19 DIAGNOSIS — R350 Frequency of micturition: Secondary | ICD-10-CM | POA: Diagnosis not present

## 2018-10-19 DIAGNOSIS — E663 Overweight: Secondary | ICD-10-CM

## 2018-10-19 LAB — CBC WITH DIFFERENTIAL/PLATELET
Basophils Absolute: 0 10*3/uL (ref 0.0–0.1)
Basophils Relative: 0.7 % (ref 0.0–3.0)
Eosinophils Absolute: 0.1 10*3/uL (ref 0.0–0.7)
Eosinophils Relative: 2 % (ref 0.0–5.0)
HCT: 38.9 % — ABNORMAL LOW (ref 39.0–52.0)
Hemoglobin: 13 g/dL (ref 13.0–17.0)
Lymphocytes Relative: 37.8 % (ref 12.0–46.0)
Lymphs Abs: 2.5 10*3/uL (ref 0.7–4.0)
MCHC: 33.5 g/dL (ref 30.0–36.0)
MCV: 90.4 fl (ref 78.0–100.0)
Monocytes Absolute: 0.6 10*3/uL (ref 0.1–1.0)
Monocytes Relative: 8.4 % (ref 3.0–12.0)
Neutro Abs: 3.4 10*3/uL (ref 1.4–7.7)
Neutrophils Relative %: 51.1 % (ref 43.0–77.0)
Platelets: 294 10*3/uL (ref 150.0–400.0)
RBC: 4.3 Mil/uL (ref 4.22–5.81)
RDW: 13.4 % (ref 11.5–15.5)
WBC: 6.7 10*3/uL (ref 4.0–10.5)

## 2018-10-19 LAB — LIPID PANEL
Cholesterol: 179 mg/dL (ref 0–200)
HDL: 66.4 mg/dL (ref >39.00)
LDL Cholesterol: 97 mg/dL (ref 0–99)
NonHDL: 112.66
Total CHOL/HDL Ratio: 3
Triglycerides: 79 mg/dL (ref 0.0–149.0)
VLDL: 15.8 mg/dL (ref 0.0–40.0)

## 2018-10-19 LAB — BASIC METABOLIC PANEL
BUN: 13 mg/dL (ref 6–23)
CO2: 27 mEq/L (ref 19–32)
Calcium: 9.5 mg/dL (ref 8.4–10.5)
Chloride: 100 mEq/L (ref 96–112)
Creatinine, Ser: 0.72 mg/dL (ref 0.40–1.50)
GFR: 108.96 mL/min (ref >60.00)
Glucose, Bld: 128 mg/dL — ABNORMAL HIGH (ref 70–99)
Potassium: 4.6 mEq/L (ref 3.5–5.1)
Sodium: 135 mEq/L (ref 135–145)

## 2018-10-19 LAB — HEPATIC FUNCTION PANEL
ALT: 15 U/L (ref 0–53)
AST: 16 U/L (ref 0–37)
Albumin: 4.8 g/dL (ref 3.5–5.2)
Alkaline Phosphatase: 52 U/L (ref 39–117)
Bilirubin, Direct: 0.1 mg/dL (ref 0.0–0.3)
Total Bilirubin: 0.5 mg/dL (ref 0.2–1.2)
Total Protein: 7.1 g/dL (ref 6.0–8.3)

## 2018-10-19 NOTE — Assessment & Plan Note (Signed)
Encouraged pt to reschedule w/ urology for ongoing management

## 2018-10-19 NOTE — Patient Instructions (Addendum)
Follow up in 6 months to recheck BP and cholesterol We'll notify you of your lab results and make any changes if needed Continue to work on healthy diet and regular exercise- you can do it! Call and schedule a urology follow up Call with any questions or concerns Stay Safe!!

## 2018-10-19 NOTE — Assessment & Plan Note (Signed)
Stressed need for healthy diet and regular exercise.  Will continue to follow.

## 2018-10-19 NOTE — Assessment & Plan Note (Signed)
Pt's PE WNL w/ exception of mildly elevated BP and being overweight.  Flu and Pneumovax given today.  UTD on colonoscopy.  Check labs.  Anticipatory guidance provided.

## 2018-10-19 NOTE — Progress Notes (Signed)
   Subjective:    Patient ID: Dylan [name], male    DOB: 06-13-1950, 68 y.o.   MRN: [id number]  HPI CPE- UTD on colonoscopy.  Due for Pneumovax and flu.  Sees w/ Urology   Review of Systems Patient reports no vision/hearing changes, anorexia, fever ,adenopathy, persistant/recurrent hoarseness, swallowing issues, chest pain, palpitations, edema, persistant/recurrent cough, hemoptysis, dyspnea (rest,exertional, paroxysmal nocturnal), gastrointestinal  bleeding (melena, rectal bleeding), abdominal pain, excessive heart burn, syncope, focal weakness, memory loss, numbness & tingling, skin/hair/nail changes, depression, anxiety, abnormal bruising/bleeding.   + urinary frequency, urgency, incomplete bladder emptying due to BPH    Objective:   Physical Exam General Appearance:    Alert, cooperative, no distress, appears stated age  Head:    Normocephalic, without obvious abnormality, atraumatic  Eyes:    PERRL, conjunctiva/corneas clear, EOM's intact, fundi    benign, both eyes       Ears:    Normal TM's and external ear canals, both ears  Nose:   Deferred due to COVID  Throat:   Neck:   Supple, symmetrical, trachea midline, no adenopathy;       thyroid:  No enlargement/tenderness/nodules  Back:     Symmetric, no curvature, ROM normal, no CVA tenderness  Lungs:     Clear to auscultation bilaterally, respirations unlabored  Chest wall:    No tenderness or deformity  Heart:    Regular rate and rhythm, S1 and S2 normal, no murmur, rub   or gallop  Abdomen:     Soft, non-tender, bowel sounds active all four quadrants,    no masses, no organomegaly  Genitalia:    Deferred to urology  Rectal:    Extremities:   Extremities normal, atraumatic, no cyanosis or edema  Pulses:   2+ and symmetric all extremities  Skin:   Skin color, texture, turgor normal, no rashes or lesions  Lymph nodes:   Cervical, supraclavicular, and axillary nodes normal  Neurologic:   CNII-XII intact. Normal strength,  sensation and reflexes      throughout          Assessment & Plan:

## 2018-10-19 NOTE — Assessment & Plan Note (Signed)
Mildly elevated today.  Asymptomatic.  No med changes at this time.  Will follow.

## 2018-10-20 ENCOUNTER — Other Ambulatory Visit (INDEPENDENT_AMBULATORY_CARE_PROVIDER_SITE_OTHER): Payer: Medicare HMO

## 2018-10-20 ENCOUNTER — Encounter: Payer: Self-pay | Admitting: Family Medicine

## 2018-10-20 DIAGNOSIS — R7309 Other abnormal glucose: Secondary | ICD-10-CM | POA: Diagnosis not present

## 2018-10-20 DIAGNOSIS — M159 Polyosteoarthritis, unspecified: Secondary | ICD-10-CM

## 2018-10-20 DIAGNOSIS — M4692 Unspecified inflammatory spondylopathy, cervical region: Secondary | ICD-10-CM

## 2018-10-20 LAB — TSH: TSH: 1.09 u[IU]/mL (ref 0.35–4.50)

## 2018-10-20 LAB — HEMOGLOBIN A1C: Hgb A1c MFr Bld: 6.1 % (ref 4.6–6.5)

## 2018-10-20 LAB — PSA, MEDICARE: PSA: 0.63 ng/ml (ref 0.10–4.00)

## 2018-10-21 MED ORDER — HYDROCODONE-ACETAMINOPHEN 10-325 MG PO TABS: 1.0000 | ORAL_TABLET | Freq: Four times a day (QID) | ORAL | 0 refills | Status: DC | PRN

## 2018-10-21 NOTE — Telephone Encounter (Signed)
Last OV 10/19/18 Hydrocodone last filled 09/23/18 #120 with 0

## 2018-11-18 ENCOUNTER — Encounter: Payer: Self-pay | Admitting: Family Medicine

## 2018-11-18 DIAGNOSIS — M4692 Unspecified inflammatory spondylopathy, cervical region: Secondary | ICD-10-CM

## 2018-11-18 DIAGNOSIS — M159 Polyosteoarthritis, unspecified: Secondary | ICD-10-CM

## 2018-11-19 ENCOUNTER — Other Ambulatory Visit: Payer: Self-pay | Admitting: Family Medicine

## 2018-11-19 NOTE — Telephone Encounter (Signed)
Last OV 10/19/18 Hydrocodone last filled 10/21/18 #120 with 0

## 2018-11-20 ENCOUNTER — Encounter: Payer: Self-pay | Admitting: Family Medicine

## 2018-11-20 MED ORDER — HYDROCODONE-ACETAMINOPHEN 10-325 MG PO TABS: 1.0000 | ORAL_TABLET | Freq: Four times a day (QID) | ORAL | 0 refills | Status: DC | PRN

## 2018-11-26 ENCOUNTER — Encounter: Payer: Self-pay | Admitting: Family Medicine

## 2018-11-26 ENCOUNTER — Other Ambulatory Visit: Payer: Self-pay | Admitting: Family Medicine

## 2018-11-26 DIAGNOSIS — G4730 Sleep apnea, unspecified: Secondary | ICD-10-CM

## 2018-11-26 DIAGNOSIS — G4700 Insomnia, unspecified: Secondary | ICD-10-CM

## 2018-11-26 NOTE — Telephone Encounter (Signed)
Last OV 10/19/18 ambien last filled 08/31/18 #30 with 2

## 2018-11-27 ENCOUNTER — Encounter: Payer: Self-pay | Admitting: Family Medicine

## 2018-11-27 MED ORDER — ZOLPIDEM TARTRATE 10 MG PO TABS: 10.0000 mg | ORAL_TABLET | Freq: Every day | ORAL | 2 refills | Status: DC

## 2018-11-27 NOTE — Telephone Encounter (Signed)
I believe this is a duplicate from the [place] message from yesterday.

## 2018-12-10 ENCOUNTER — Encounter: Payer: Self-pay | Admitting: Family Medicine

## 2018-12-10 DIAGNOSIS — R1033 Periumbilical pain: Secondary | ICD-10-CM

## 2018-12-12 DIAGNOSIS — Z0100 Encounter for examination of eyes and vision without abnormal findings: Secondary | ICD-10-CM | POA: Diagnosis not present

## 2018-12-12 DIAGNOSIS — H43393 Other vitreous opacities, bilateral: Secondary | ICD-10-CM | POA: Diagnosis not present

## 2018-12-16 ENCOUNTER — Other Ambulatory Visit: Payer: Self-pay | Admitting: Family Medicine

## 2018-12-16 ENCOUNTER — Other Ambulatory Visit: Payer: Self-pay

## 2018-12-16 ENCOUNTER — Ambulatory Visit (INDEPENDENT_AMBULATORY_CARE_PROVIDER_SITE_OTHER): Payer: Medicare HMO | Admitting: Nurse Practitioner

## 2018-12-16 ENCOUNTER — Other Ambulatory Visit (INDEPENDENT_AMBULATORY_CARE_PROVIDER_SITE_OTHER): Payer: Medicare HMO

## 2018-12-16 ENCOUNTER — Encounter: Payer: Self-pay | Admitting: Nurse Practitioner

## 2018-12-16 VITALS — BP 104/60 | HR 101 | Temp 98.3°F | Ht 72.0 in | Wt 213.0 lb

## 2018-12-16 DIAGNOSIS — R1031 Right lower quadrant pain: Secondary | ICD-10-CM | POA: Diagnosis not present

## 2018-12-16 DIAGNOSIS — M4692 Unspecified inflammatory spondylopathy, cervical region: Secondary | ICD-10-CM

## 2018-12-16 DIAGNOSIS — M159 Polyosteoarthritis, unspecified: Secondary | ICD-10-CM

## 2018-12-16 LAB — BASIC METABOLIC PANEL
BUN: 24 mg/dL — ABNORMAL HIGH (ref 6–23)
CO2: 27 mEq/L (ref 19–32)
Calcium: 9.6 mg/dL (ref 8.4–10.5)
Chloride: 105 mEq/L (ref 96–112)
Creatinine, Ser: 0.8 mg/dL (ref 0.40–1.50)
GFR: 96.44 mL/min (ref >60.00)
Glucose, Bld: 116 mg/dL — ABNORMAL HIGH (ref 70–99)
Potassium: 4 mEq/L (ref 3.5–5.1)
Sodium: 139 mEq/L (ref 135–145)

## 2018-12-16 MED ORDER — HYDROCODONE-ACETAMINOPHEN 10-325 MG PO TABS: 1.0000 | ORAL_TABLET | Freq: Four times a day (QID) | ORAL | 0 refills | Status: DC | PRN

## 2018-12-16 NOTE — Progress Notes (Signed)
Chief Complaint:   Worsening abdominal pain   IMPRESSION and PLAN:    1. RLQ pain, progressive over the last month. Splinting his RLQ with hand while I was in the room. There is a musculoskeletal as well as GI component to the pain.  Urologic source not ruled out -CT AP with contrast -Check renal function prior to contrast.   HPI:     Patient is a 68 year old male with a pmh significant for HTN, hyperlipidemia and colon polyps. He had a screening colonoscopy in 2018 Ardis Hughs) with findings of left sided diverticulosis and a couple of small adenomatous polyps.   Neldon is here today for evaluation of RLQ pain (over appendectomy site)  present since February. Pain has gotten progressively worse over the last month.  It is unrelated to eating, is worse at night and in the early morning hours. Twisting to right makes pain more prominent.  Heating pad helps, BM and passage of gas helps.  Splinting the area with his hand or by pulling right leg to chest helps more than anything.  His bowel movements are completely normal. He has also started having right groin pain but thinks that might be related to frequently pulling right knee-to-chest to get relief from the abdominal pain.  He gives a history of an enlarged prostate, says he is in need of a TURP.  He has chronic dysuria.  Last saw urology 6 months ago.  Ulcerative colitis listed on PMH.  Patient says he had ulcerative colitis in his 66s.  Data Reviewed:   08/14/18 Abd [place] Steatosis.   06/18/16 Screening colonoscopy  Two 2 to 3 mm polyps in the ascending colon and in the cecum, removed with a cold snare. Resected and retrieved. - Diverticulosis in the left colon. - The examination was otherwise normal on direct and retroflexion views.   Diagnosis  Surgical [P], ascending and cecum, polyps (2) - TUBULAR ADENOMA, TWO FRAGMENTS. - NO HIGH GRADE DYSPLASIA OR MALIGNANCY IDENTIFIED.  Review of systems:     No chest pain, no SOB, no  fevers, no urinary sx   Past Medical History:  Diagnosis Date  . Allergy    allergic rhinitis  . Anxiety    xanax as needed   . Arthritis    R knee, hands , back   . Cancer ([place])    melonoma- on face, treated with excision   . Complication of anesthesia    trouble waking up with succycholine, does not have enzyme to break down succycholine  . Dizziness    MRI brain unremarkable 02/05/2015.  Marland Kitchen GERD (gastroesophageal reflux disease)   . Headache(784.0)   . Hepatitis age 66   history of infections Hepatitis type a  . History of melanoma 15 yrs ago   face and forehead-- previously followed by Derm in [place]  . History of ulcerative colitis    non recent flares  . Hyperlipidemia   . Hypertension   . Sleep apnea    last sleep study 10 yrs ago,CPAP- intermittent use, not every night      Patient's surgical history, family medical history, social history, medications and allergies were all reviewed in Epic     Current Outpatient Medications  Medication Sig Dispense Refill  . amLODipine (NORVASC) 10 MG tablet Take 1 tablet (10 mg total) by mouth daily. 90 tablet 3  . aspirin EC 81 MG tablet Take 1 tablet (81 mg total) by mouth daily.    Marland Kitchen co-enzyme  Q-10 30 MG capsule Take 30 mg by mouth daily.    Marland Kitchen dutasteride (AVODART) 0.5 MG capsule TAKE 1 CAPSULE BY MOUTH EVERY DAY 90 capsule 1  . esomeprazole (NEXIUM) 40 MG capsule TAKE 1 CAPSULE BY MOUTH EVERY DAY BEFORE BREAKFAST 90 capsule 1  . HYDROcodone-acetaminophen (NORCO) 10-325 MG tablet Take 1 tablet by mouth every 6 (six) hours as needed for severe pain. 120 tablet 0  . irbesartan (AVAPRO) 75 MG tablet TAKE 1 TABLET BY MOUTH EVERY DAY 90 tablet 1  . Melatonin 5 MG TABS Take 1 tablet by mouth at bedtime.     . rosuvastatin (CRESTOR) 10 MG tablet TAKE 1 TABLET BY MOUTH EVERY DAY 90 tablet 1  . tamsulosin (FLOMAX) 0.4 MG CAPS capsule Take 0.4 mg by mouth.    . zolpidem (AMBIEN) 10 MG tablet Take 1 tablet (10 mg total) by mouth at  bedtime. 30 tablet 2   No current facility-administered medications for this visit.     Physical Exam:     BP 104/60 (BP Location: Left Arm, Patient Position: Sitting)   Pulse (!) 101   Temp 98.3 F (36.8 C)   Ht 6' (1.829 m)   Wt 213 lb (96.6 kg)   SpO2 98%   BMI 28.89 kg/m   GENERAL:  Pleasant male in NAD PSYCH: : Cooperative, normal affect EENT:  conjunctiva pink, mucous membranes moist, neck supple without masses CARDIAC:  RRR, , no peripheral edema PULM: Normal respiratory effort, lungs CTA bilaterally, no wheezing ABDOMEN:  Nondistended, soft, mild RLQ tenderness.  No obvious masses, no hepatomegaly,  normal bowel sounds SKIN:  turgor, no lesions seen Musculoskeletal:  Normal muscle tone, normal strength NEURO: Alert and oriented x 3, no focal neurologic deficits   Tye Savoy , NP 12/16/2018, 2:07 PM

## 2018-12-16 NOTE — Patient Instructions (Signed)
If you are age 67 or older, your body mass index should be between 23-30. Your Body mass index is 28.89 kg/m. If this is out of the aforementioned range listed, please consider follow up with your Primary Care Provider.  If you are age 45 or younger, your body mass index should be between 19-25. Your Body mass index is 28.89 kg/m. If this is out of the aformentioned range listed, please consider follow up with your Primary Care Provider.   Your provider has requested that you go to the basement level for lab work before leaving today. Press "B" on the elevator. The lab is located at the first door on the left as you exit the elevator.  You have been scheduled for a CT scan of the abdomen and pelvis at [facility].  You are scheduled on 12/23/18 at 12:30 pm. You should arrive 15 minutes prior to your appointment time for registration. Please follow the written instructions below on the day of your exam:  WARNING: IF YOU ARE ALLERGIC TO IODINE/X-RAY DYE, PLEASE NOTIFY RADIOLOGY IMMEDIATELY AT [phone]! YOU WILL BE GIVEN A 13 HOUR PREMEDICATION PREP.  1) Do not eat or drink anything after 8:30 am (4 hours prior to your test) 2) You have been given 2 bottles of oral contrast to drink. The solution may taste better if refrigerated, but do NOT add ice or any other liquid to this solution. Shake well before drinking.    Drink 1 bottle of contrast @ 10:30 am (2 hours prior to your exam)  Drink 1 bottle of contrast @ 11:30 am (1 hour prior to your exam)  You may take any medications as prescribed with a small amount of water, if necessary. If you take any of the following medications: METFORMIN, GLUCOPHAGE, GLUCOVANCE, AVANDAMET, RIOMET, FORTAMET, [place] MET, JANUMET, GLUMETZA or METAGLIP, you MAY be asked to HOLD this medication 48 hours AFTER the exam.  The purpose of you drinking the oral contrast is to aid in the visualization of your intestinal tract. The contrast solution may cause  some diarrhea. Depending on your individual set of symptoms, you may also receive an intravenous injection of x-ray contrast/dye. Plan on being at [facility] for 30 minutes or longer, depending on the type of exam you are having performed.  This test typically takes 30-45 minutes to complete.  If you have any questions regarding your exam or if you need to reschedule, you may call the CT department at [phone] between the hours of 8:00 am and 5:00 pm, Monday-Friday.  _____________________________________________________________________  Thank you for choosing me and [facility].   Tye Savoy, NP

## 2018-12-16 NOTE — Telephone Encounter (Signed)
Last OV 10/19/18 Hydrocodone last filled 11/20/18 #120 with 0

## 2018-12-18 ENCOUNTER — Encounter: Payer: Self-pay | Admitting: Nurse Practitioner

## 2018-12-18 ENCOUNTER — Other Ambulatory Visit: Payer: Self-pay | Admitting: Physician Assistant

## 2018-12-18 NOTE — Progress Notes (Signed)
I agree with the above note, plan 

## 2018-12-23 ENCOUNTER — Other Ambulatory Visit: Payer: Self-pay

## 2018-12-23 ENCOUNTER — Ambulatory Visit (HOSPITAL_COMMUNITY)
Admission: RE | Admit: 2018-12-23 | Discharge: 2018-12-23 | Disposition: A | Discharge status: Home / Self Care | Payer: Medicare HMO | Source: Ambulatory Visit | Attending: Nurse Practitioner | Admitting: Nurse Practitioner

## 2018-12-23 ENCOUNTER — Encounter (HOSPITAL_COMMUNITY): Payer: Self-pay

## 2018-12-23 DIAGNOSIS — R1031 Right lower quadrant pain: Secondary | ICD-10-CM | POA: Insufficient documentation

## 2018-12-23 DIAGNOSIS — K5730 Diverticulosis of large intestine without perforation or abscess without bleeding: Secondary | ICD-10-CM | POA: Diagnosis not present

## 2018-12-23 DIAGNOSIS — R109 Unspecified abdominal pain: Secondary | ICD-10-CM | POA: Diagnosis not present

## 2018-12-23 MED ORDER — SODIUM CHLORIDE (PF) 0.9 % IJ SOLN
INTRAMUSCULAR | Status: AC
  Filled 2018-12-23: qty 50

## 2018-12-23 MED ORDER — IOHEXOL 300 MG/ML  SOLN
100.0000 mL | Freq: Once | INTRAMUSCULAR | Status: AC | PRN
  Administered 2018-12-23: 100 mL via INTRAVENOUS

## 2019-01-02 ENCOUNTER — Other Ambulatory Visit: Payer: Self-pay | Admitting: Family Medicine

## 2019-01-02 DIAGNOSIS — R350 Frequency of micturition: Secondary | ICD-10-CM

## 2019-01-13 ENCOUNTER — Other Ambulatory Visit: Payer: Self-pay | Admitting: Family Medicine

## 2019-01-13 DIAGNOSIS — M159 Polyosteoarthritis, unspecified: Secondary | ICD-10-CM

## 2019-01-13 DIAGNOSIS — M4692 Unspecified inflammatory spondylopathy, cervical region: Secondary | ICD-10-CM

## 2019-01-14 NOTE — Telephone Encounter (Signed)
Last OV 10/19/18 Hydrocodone last filled 12/16/18 #120 with 0

## 2019-01-15 MED ORDER — HYDROCODONE-ACETAMINOPHEN 10-325 MG PO TABS: 1.0000 | ORAL_TABLET | Freq: Four times a day (QID) | ORAL | 0 refills | Status: DC | PRN

## 2019-02-10 DIAGNOSIS — M25551 Pain in right hip: Secondary | ICD-10-CM | POA: Diagnosis not present

## 2019-02-11 ENCOUNTER — Other Ambulatory Visit: Payer: Self-pay | Admitting: Family Medicine

## 2019-02-11 DIAGNOSIS — M159 Polyosteoarthritis, unspecified: Secondary | ICD-10-CM

## 2019-02-11 DIAGNOSIS — M4692 Unspecified inflammatory spondylopathy, cervical region: Secondary | ICD-10-CM

## 2019-02-11 MED ORDER — HYDROCODONE-ACETAMINOPHEN 10-325 MG PO TABS: 1.0000 | ORAL_TABLET | Freq: Four times a day (QID) | ORAL | 0 refills | Status: DC | PRN

## 2019-02-11 NOTE — Telephone Encounter (Signed)
Last OV 10/19/18 Hydrocodone last filled 01/15/19 #120 with 0

## 2019-02-12 ENCOUNTER — Other Ambulatory Visit: Payer: Self-pay | Admitting: Family Medicine

## 2019-02-12 DIAGNOSIS — E785 Hyperlipidemia, unspecified: Secondary | ICD-10-CM

## 2019-02-13 ENCOUNTER — Other Ambulatory Visit: Payer: Self-pay | Admitting: Internal Medicine

## 2019-02-18 ENCOUNTER — Other Ambulatory Visit: Payer: Self-pay | Admitting: Family Medicine

## 2019-02-18 ENCOUNTER — Encounter: Payer: Self-pay | Admitting: Family Medicine

## 2019-02-18 DIAGNOSIS — G4700 Insomnia, unspecified: Secondary | ICD-10-CM

## 2019-02-18 NOTE — Telephone Encounter (Signed)
Last OV 10/19/18 Zolpidem last filled 11/27/18 #30 with 2

## 2019-02-18 NOTE — Telephone Encounter (Signed)
Last refill: 10.2.20 #30, 2  Last OV: 8.24.20 dx. CPE

## 2019-03-10 DIAGNOSIS — R350 Frequency of micturition: Secondary | ICD-10-CM | POA: Diagnosis not present

## 2019-03-10 DIAGNOSIS — N401 Enlarged prostate with lower urinary tract symptoms: Secondary | ICD-10-CM | POA: Diagnosis not present

## 2019-03-11 ENCOUNTER — Other Ambulatory Visit: Payer: Self-pay | Admitting: Family Medicine

## 2019-03-11 DIAGNOSIS — M545 Low back pain: Secondary | ICD-10-CM | POA: Diagnosis not present

## 2019-03-11 DIAGNOSIS — M5136 Other intervertebral disc degeneration, lumbar region: Secondary | ICD-10-CM | POA: Diagnosis not present

## 2019-03-11 DIAGNOSIS — M159 Polyosteoarthritis, unspecified: Secondary | ICD-10-CM

## 2019-03-11 DIAGNOSIS — M48062 Spinal stenosis, lumbar region with neurogenic claudication: Secondary | ICD-10-CM | POA: Diagnosis not present

## 2019-03-11 DIAGNOSIS — M4692 Unspecified inflammatory spondylopathy, cervical region: Secondary | ICD-10-CM

## 2019-03-11 MED ORDER — HYDROCODONE-ACETAMINOPHEN 10-325 MG PO TABS: 1.0000 | ORAL_TABLET | Freq: Four times a day (QID) | ORAL | 0 refills | Status: DC | PRN

## 2019-03-11 NOTE — Telephone Encounter (Signed)
Last OV 10/19/18 Hydrocodone last filled 02/11/19 #120 with 0

## 2019-03-22 ENCOUNTER — Encounter: Payer: Self-pay | Admitting: Internal Medicine

## 2019-03-22 ENCOUNTER — Ambulatory Visit: Payer: Medicare HMO | Admitting: Internal Medicine

## 2019-03-22 ENCOUNTER — Other Ambulatory Visit: Payer: Self-pay

## 2019-03-22 VITALS — BP 144/66 | HR 86 | Ht 72.0 in | Wt 218.4 lb

## 2019-03-22 DIAGNOSIS — E782 Mixed hyperlipidemia: Secondary | ICD-10-CM

## 2019-03-22 DIAGNOSIS — I2510 Atherosclerotic heart disease of native coronary artery without angina pectoris: Secondary | ICD-10-CM

## 2019-03-22 NOTE — Patient Instructions (Signed)
Medication Instructions:  No changes *If you need a refill on your cardiac medications before your next appointment, please call your pharmacy*  Lab Work: none If you have labs (blood work) drawn today and your tests are completely normal, you will receive your results only by: Marland Kitchen MyChart Message (if you have MyChart) OR . A paper copy in the mail If you have any lab test that is abnormal or we need to change your treatment, we will call you to review the results.  Testing/Procedures: none  Follow-Up: At [facility], you and your health needs are our priority.  As part of our continuing mission to provide you with exceptional heart care, we have created designated Provider Care Teams.  These Care Teams include your primary Cardiologist (physician) and Advanced Practice Providers (APPs -  Physician Assistants and Nurse Practitioners) who all work together to provide you with the care you need, when you need it.  Your next appointment:   12 month(s)  The format for your next appointment:   Either In Person or Virtual  Provider:   You may see Dr. Dorris Carnes or one of the following Advanced Practice Providers on your designated Care Team:    Richardson Dopp, [name]  [place], [place]  Daune Perch, NP   Other Instructions

## 2019-03-22 NOTE — Progress Notes (Signed)
Cardiology Office Note    Date:  03/22/2019   ID:  Dylan [name], Dylan [name] 1951/01/23, MRN [id number]  PCP:  [name], MD  Cardiologist:  Dr. Harrington Challenger  F/U of CAD, HTN  History of Present Illness:   Dylan [name] is a 69 y.o. male with past medical history of hypertension, hyperlipidemia, diabetes (diet-controlled), degenerative joint disease, GERD, sleep apnea on CPAP and mild CAD   Pt was admitted to [facility] in 2019 with chest pressure  He had been golfing   He wonders if he was dehydrated  Myovue was abnormal  He went on to have cath that showed minimal CAD   He was seen by B Bhagat after     The pt says he has been feeling good   He denies CP   Breathing is OK  No dizziness   He has been trying to stay hydrated    Past Medical History:  Diagnosis Date  . Allergy    allergic rhinitis  . Anxiety    xanax as needed   . Arthritis    R knee, hands , back   . Cancer ([place])    melonoma- on face, treated with excision   . Complication of anesthesia    trouble waking up with succycholine, does not have enzyme to break down succycholine  . Dizziness    MRI brain unremarkable 02/05/2015.  Marland Kitchen GERD (gastroesophageal reflux disease)   . Headache(784.0)   . Hepatitis age 33   history of infections Hepatitis type a  . History of melanoma 15 yrs ago   face and forehead-- previously followed by Derm in [place]  . History of ulcerative colitis    non recent flares  . Hyperlipidemia   . Hypertension   . Sleep apnea    last sleep study 10 yrs ago,CPAP- intermittent use, not every night      Past Surgical History:  Procedure Laterality Date  . acl and mensicus repair reconstruct Right 1980's  . ANTERIOR CERVICAL DECOMP/DISCECTOMY FUSION  10/15/2011   Procedure: ANTERIOR CERVICAL DECOMPRESSION/DISCECTOMY FUSION 1 LEVEL;  Surgeon: [name], MD;  Location: [facility];  Service: Neurosurgery;  Laterality: N/A;  Cervical four-five Anterior cervical  decompression/diskectomy/fusion  . APPENDECTOMY  1969  . COLONOSCOPY    . HERNIA REPAIR     inguinal / abdominal - repaired as an infant   . JOINT REPLACEMENT    . KNEE ARTHROSCOPY Left last done oct 2013   x 3  . KNEE SURGERY  yrs ago   right knee  . LEFT HEART CATH AND CORONARY ANGIOGRAPHY N/A 11/14/2017   Procedure: LEFT HEART CATH AND CORONARY ANGIOGRAPHY;  Surgeon: [name], MD;  Location: [facility];  Service: Cardiovascular;  Laterality: N/A;  . LUMBAR LAMINECTOMY WITH COFLEX 2 LEVEL N/A 09/12/2015   Procedure: L3-4 L4-5 Laminectomy with coflex;  Surgeon: [name], MD;  Location: [facility];  Service: Neurosurgery;  Laterality: N/A;  L3-4 L4-5 Laminectomy with coflex  . NASAL SINUS SURGERY     multiple sinus surgery - 1980's   . TONSILLECTOMY  1969  . TOTAL KNEE ARTHROPLASTY Left 10/22/2012   Procedure: TOTAL KNEE ARTHROPLASTY;  Surgeon: [name], MD;  Location: [facility];  Service: Orthopedics;  Laterality: Left;    Current Medications: Prior to Admission medications   Medication Sig Start Date End Date Taking? Authorizing Provider  nortriptyline (PAMELOR) 10 MG capsule Take 10 mg by mouth at bedtime.  Yes [provider]  amLODipine (NORVASC) 5 MG tablet Take 1 tablet (5 mg total) by mouth daily. 07/17/17   [name], MD  aspirin EC 81 MG tablet Take 1 tablet (81 mg total) by mouth daily. 10/24/12   [name], [name]  co-enzyme Q-10 30 MG capsule Take 30 mg by mouth daily.    [provider]  dutasteride (AVODART) 0.5 MG capsule TAKE 1 CAPSULE (0.5 MG TOTAL) BY MOUTH DAILY. 06/18/17   [name], MD  esomeprazole (NEXIUM) 40 MG capsule TAKE ONE CAPSULE BY MOUTH EVERY DAY BEFORE BREAKFAST 05/19/17   [name], MD  HYDROcodone-acetaminophen (NORCO) 10-325 MG tablet Take 1 tablet by mouth every 6 (six) hours as needed for severe pain. 12/04/17   [name], MD  lidocaine (LIDODERM) 5 % Place 1 patch onto the  skin daily. May wear up to 12 hours 11/02/17   [provider]  Melatonin 5 MG TABS Take 1 tablet by mouth at bedtime.     [provider]  mupirocin ointment (BACTROBAN) 2 % Apply 1 application topically 2 (two) times daily. 10/10/17   [name], MD  naloxone [facility]) nasal spray 4 mg/0.1 mL Use as directed 07/22/17   [name], MD  nitroGLYCERIN (NITROSTAT) 0.4 MG SL tablet Place 1 tablet (0.4 mg total) under the tongue every 5 (five) minutes x 3 doses as needed for chest pain. 11/14/17   Bhagat, [name], PA  rosuvastatin (CRESTOR) 10 MG tablet TAKE 1 TABLET BY MOUTH EVERY DAY 05/11/17   [name], MD  tamsulosin (FLOMAX) 0.4 MG CAPS capsule Take 0.4 mg by mouth.    [provider]  valsartan (DIOVAN) 80 MG tablet TAKE 1 TABLET BY MOUTH EVERY DAY 12/02/17   [name], MD  zolpidem (AMBIEN) 10 MG tablet TAKE 1 TABLET BY MOUTH EVERY DAY AT BEDTIME AS NEEDED 12/11/17   [name], MD    Allergies:   Heparin, Succinylcholine chloride, Vancomycin, and Penicillins   Social History   Socioeconomic History  . Marital status: Married    Spouse name: Not on file  . Number of children: 3  . Years of education: Not on file  . Highest education level: Not on file  Occupational History  . Occupation: Materials engineer  Tobacco Use  . Smoking status: Never Smoker  . Smokeless tobacco: Former Systems developer    Types: Chew  Substance and Sexual Activity  . Alcohol use: No  . Drug use: No  . Sexual activity: Not on file  Other Topics Concern  . Not on file  Social History Narrative   Wife works at cath lab at [employer] advanced breast cancer   Lives with wife in a 2 story home.  Retired.  Education: college.    Social Determinants of Health   Financial Resource Strain:   . Difficulty of Paying Living Expenses: Not on file  Food Insecurity:   . Worried About Charity fundraiser in the Last Year: Not on file  . Ran Out of Food in the Last Year:  Not on file  Transportation Needs:   . Lack of Transportation (Medical): Not on file  . Lack of Transportation (Non-Medical): Not on file  Physical Activity:   . Days of Exercise per Week: Not on file  . Minutes of Exercise per Session: Not on file  Stress:   . Feeling of Stress : Not on file  Social Connections:   .  Frequency of Communication with Friends and Family: Not on file  . Frequency of Social Gatherings with Friends and Family: Not on file  . Attends Religious Services: Not on file  . Active Member of Clubs or Organizations: Not on file  . Attends Archivist Meetings: Not on file  . Marital Status: Not on file     Family History:  The patient's family history includes Cancer in his brother; Diabetes in his father; Ovarian cancer in his mother.  ROS:   Please see the history of present illness.    ROS All other systems reviewed and are negative.   PHYSICAL EXAM:   VS:  BP (!) 144/66   Pulse 86   Ht 6' (1.829 m)   Wt 218 lb 6.4 oz (99.1 kg)   BMI 29.62 kg/m    GEN: Overweight 69 yo  in no acute distress  HEENT: normal  Neck: no JVD, carotid bruits  Cardiac: RRR; no murmurs, rubs, or gallops,no LE  edema  Respiratory:  clear to auscultation bilaterally, normal work of breathing GI: soft, nontender, nondistended, + BS MS: no deformity or atrophy  Skin: warm and dry, no rash Neuro:  Alert and Oriented x 3, Strength and sensation are intact Psych: euthymic mood, full affect  Wt Readings from Last 3 Encounters:  03/22/19 218 lb 6.4 oz (99.1 kg)  12/16/18 213 lb (96.6 kg)  10/19/18 211 lb 6 oz (95.9 kg)      Studies/Labs Reviewed:   EKG:  EKG is ordered today.  SR 86 bpm   LAFB    Recent Labs: 10/19/2018: ALT 15; Hemoglobin 13.0; Platelets 294.0; TSH 1.09 12/16/2018: BUN 24; Creatinine, Ser 0.80; Potassium 4.0; Sodium 139   Lipid Panel    Component Value Date/Time   CHOL 179 10/19/2018 0845   TRIG 79.0 10/19/2018 0845   HDL 66.40 10/19/2018  0845   CHOLHDL 3 10/19/2018 0845   VLDL 15.8 10/19/2018 0845   LDLCALC 97 10/19/2018 0845   LDLCALC 83 04/10/2018 1529   LDLDIRECT 142.0 11/11/2016 1626    Additional studies/ records that were reviewed today include:   Stress test 11/12/17 IMPRESSION: 1. Large area of reversibility involving the mid and distal anterior, septal, and inferoapical walls, suspicious for inducible myocardial ischemia.  2. Mild left ventricular dilatation, without regional wall motion abnormality.  3. Left ventricular ejection fraction 59%  4. Non invasive risk stratification*: High  LEFT HEART CATH AND CORONARY ANGIOGRAPHY  10/2017  Conclusion     Prox LAD lesion is 10% stenosed. Nonobstructive CAD.  The left ventricular systolic function is normal.  LV end diastolic pressure is mildly elevated. LVEDP 18 mm Hg.  The left ventricular ejection fraction is 55-65% by visual estimate.  There is no aortic valve stenosis.   Continue aggressive preventive therapy.       ASSESSMENT & PLAN:   1.  CAD  -False positive stress test. Cath with 10%pLAD. Continue ASA and statin. Pt remains asymptomatic  2. HTN Keep on current regimen    3. OSA on CPAP - compliant  4. HLD -Continue statin. LDL 83  HDL 46    Medication Adjustments/Labs and Tests Ordered: Current medicines are reviewed at length with the patient today.  Concerns regarding medicines are outlined above.  Medication changes, Labs and Tests ordered today are listed in the Patient Instructions below. There are no Patient Instructions on file for this visit.   Signed, Dorris Carnes, MD  03/22/2019 10:29 AM  [facility] [place], [place], [place]  [address] Phone: [phone]; Fax: [phone]

## 2019-03-23 DIAGNOSIS — M5136 Other intervertebral disc degeneration, lumbar region: Secondary | ICD-10-CM | POA: Diagnosis not present

## 2019-03-25 DIAGNOSIS — M48062 Spinal stenosis, lumbar region with neurogenic claudication: Secondary | ICD-10-CM | POA: Diagnosis not present

## 2019-03-25 DIAGNOSIS — M5136 Other intervertebral disc degeneration, lumbar region: Secondary | ICD-10-CM | POA: Diagnosis not present

## 2019-04-06 DIAGNOSIS — M5416 Radiculopathy, lumbar region: Secondary | ICD-10-CM | POA: Diagnosis not present

## 2019-04-11 ENCOUNTER — Ambulatory Visit: Payer: Medicare HMO | Attending: Internal Medicine

## 2019-04-11 ENCOUNTER — Ambulatory Visit: Payer: Self-pay

## 2019-04-11 DIAGNOSIS — Z23 Encounter for immunization: Secondary | ICD-10-CM | POA: Insufficient documentation

## 2019-04-12 ENCOUNTER — Encounter: Payer: Self-pay | Admitting: Family Medicine

## 2019-04-12 ENCOUNTER — Other Ambulatory Visit: Payer: Self-pay | Admitting: Family Medicine

## 2019-04-12 DIAGNOSIS — M5412 Radiculopathy, cervical region: Secondary | ICD-10-CM

## 2019-04-12 DIAGNOSIS — M4692 Unspecified inflammatory spondylopathy, cervical region: Secondary | ICD-10-CM

## 2019-04-12 DIAGNOSIS — M159 Polyosteoarthritis, unspecified: Secondary | ICD-10-CM

## 2019-04-12 MED ORDER — HYDROCODONE-ACETAMINOPHEN 10-325 MG PO TABS: 1.0000 | ORAL_TABLET | Freq: Four times a day (QID) | ORAL | 0 refills | Status: DC | PRN

## 2019-04-12 NOTE — Telephone Encounter (Signed)
Last refill: 1.14.21 #120, 0  Last OV: 8.24.20 dx. CPE

## 2019-04-12 NOTE — Telephone Encounter (Signed)
Last OV 10/19/18 Hydrocodone last filled 03/11/19 #120 with 0

## 2019-04-14 ENCOUNTER — Other Ambulatory Visit: Payer: Self-pay

## 2019-04-14 ENCOUNTER — Encounter (HOSPITAL_COMMUNITY): Payer: Self-pay | Admitting: Emergency Medicine

## 2019-04-14 ENCOUNTER — Emergency Department (HOSPITAL_COMMUNITY): Payer: Medicare HMO

## 2019-04-14 ENCOUNTER — Emergency Department (HOSPITAL_COMMUNITY)
Admission: EM | Admit: 2019-04-14 | Discharge: 2019-04-14 | Disposition: A | Discharge status: Home / Self Care | Payer: Medicare HMO | Attending: Emergency Medicine | Admitting: Emergency Medicine

## 2019-04-14 DIAGNOSIS — Z85828 Personal history of other malignant neoplasm of skin: Secondary | ICD-10-CM | POA: Insufficient documentation

## 2019-04-14 DIAGNOSIS — Z87891 Personal history of nicotine dependence: Secondary | ICD-10-CM | POA: Insufficient documentation

## 2019-04-14 DIAGNOSIS — R0789 Other chest pain: Secondary | ICD-10-CM | POA: Diagnosis not present

## 2019-04-14 DIAGNOSIS — M79602 Pain in left arm: Secondary | ICD-10-CM | POA: Diagnosis not present

## 2019-04-14 DIAGNOSIS — E119 Type 2 diabetes mellitus without complications: Secondary | ICD-10-CM | POA: Insufficient documentation

## 2019-04-14 DIAGNOSIS — I10 Essential (primary) hypertension: Secondary | ICD-10-CM | POA: Diagnosis not present

## 2019-04-14 DIAGNOSIS — Z79899 Other long term (current) drug therapy: Secondary | ICD-10-CM | POA: Insufficient documentation

## 2019-04-14 DIAGNOSIS — Z7901 Long term (current) use of anticoagulants: Secondary | ICD-10-CM | POA: Insufficient documentation

## 2019-04-14 DIAGNOSIS — R079 Chest pain, unspecified: Secondary | ICD-10-CM | POA: Diagnosis not present

## 2019-04-14 LAB — CBC
HCT: 38.5 % — ABNORMAL LOW (ref 39.0–52.0)
Hemoglobin: 12.8 g/dL — ABNORMAL LOW (ref 13.0–17.0)
MCH: 30.3 pg (ref 26.0–34.0)
MCHC: 33.2 g/dL (ref 30.0–36.0)
MCV: 91.2 fL (ref 80.0–100.0)
Platelets: 266 10*3/uL (ref 150–400)
RBC: 4.22 MIL/uL (ref 4.22–5.81)
RDW: 13.1 % (ref 11.5–15.5)
WBC: 7.9 10*3/uL (ref 4.0–10.5)
nRBC: 0 % (ref 0.0–0.2)

## 2019-04-14 LAB — PROTIME-INR
INR: 0.9 (ref 0.8–1.2)
Prothrombin Time: 12.4 seconds (ref 11.4–15.2)

## 2019-04-14 LAB — TROPONIN I (HIGH SENSITIVITY)
Troponin I (High Sensitivity): 3 ng/L (ref <18)
Troponin I (High Sensitivity): 4 ng/L (ref <18)

## 2019-04-14 LAB — BASIC METABOLIC PANEL
Anion gap: 8 (ref 5–15)
BUN: 15 mg/dL (ref 8–23)
CO2: 23 mmol/L (ref 22–32)
Calcium: 9.2 mg/dL (ref 8.9–10.3)
Chloride: 100 mmol/L (ref 98–111)
Creatinine, Ser: 0.77 mg/dL (ref 0.61–1.24)
GFR calc Af Amer: >60 mL/min (ref >60)
GFR calc non Af Amer: >60 mL/min (ref >60)
Glucose, Bld: 121 mg/dL — ABNORMAL HIGH (ref 70–99)
Potassium: 3.8 mmol/L (ref 3.5–5.1)
Sodium: 131 mmol/L — ABNORMAL LOW (ref 135–145)

## 2019-04-14 LAB — HEPATIC FUNCTION PANEL
ALT: 21 U/L (ref 0–44)
AST: 14 U/L — ABNORMAL LOW (ref 15–41)
Albumin: 3.7 g/dL (ref 3.5–5.0)
Alkaline Phosphatase: 42 U/L (ref 38–126)
Bilirubin, Direct: <0.1 mg/dL (ref 0.0–0.2)
Total Bilirubin: 0.7 mg/dL (ref 0.3–1.2)
Total Protein: 6.6 g/dL (ref 6.5–8.1)

## 2019-04-14 MED ORDER — SODIUM CHLORIDE 0.9% FLUSH: 3.0000 mL | Freq: Once | INTRAVENOUS | Status: DC

## 2019-04-14 MED ORDER — ACETAMINOPHEN 325 MG PO TABS
650.0000 mg | ORAL_TABLET | Freq: Once | ORAL | Status: AC
  Administered 2019-04-14: 650 mg via ORAL
  Filled 2019-04-14: qty 2

## 2019-04-14 MED ORDER — MORPHINE SULFATE (PF) 4 MG/ML IV SOLN
4.0000 mg | Freq: Once | INTRAVENOUS | Status: AC
  Administered 2019-04-14: 09:00:00 4 mg via INTRAVENOUS
  Filled 2019-04-14: qty 1

## 2019-04-14 MED ORDER — METHOCARBAMOL 500 MG PO TABS: 500.0000 mg | ORAL_TABLET | Freq: Two times a day (BID) | ORAL | 0 refills | Status: DC

## 2019-04-14 NOTE — Discharge Instructions (Addendum)
As discussed, all of your labs, chest x-ray, and EKG looked good today.  I suspect your left arm symptoms are coming from your chronic neck pain.  Continue take your pain medication as prescribed.  I have prescribed you a muscle relaxer.  You may take it twice a day as needed for pain.  Medication can cause drowsiness and do not drive or operate machinery on the medication.  Follow-up with your cardiologist within the next week for further evaluation.  You may also want to schedule an appointment with your neurosurgeon to further evaluate your neck pain.  Return to the ER for new or worsening symptoms.

## 2019-04-14 NOTE — ED Provider Notes (Signed)
[facility] EMERGENCY DEPARTMENT Provider Note   CSN: [id number] Arrival date & time: 04/14/19  0556     History Chief Complaint  Patient presents with  . Chest Pain    Dylan [name] is a 69 y.o. male with a past medical history significant for hyperlipidemia, hypertension, diabetes (diet controlled), and OSA sometimes compliant with CPAP who presents to the ED due to gradual onset of worsening left arm pain that radiates to the left pectoral region x1 day.  Patient states yesterday around noon he started to feel left shoulder pain that radiates down to his left elbow and left pectoral region. When he first experienced the pain he was putting things away and cleaning, no exertion. He notes pain is constant and achy in sensation and feels like his arm is "dead". He is still experiencing this pain. He took a baby ASA and hydrocodone this morning with mild relief. Patient notes pain improves with direct pressure to arm.  Patient denies associative shortness of breath, nausea, vomiting, and numbness/tingling. Patient denies injury to left shoulder, but notes he received his COVID vaccine on Sunday at noon. No issues with left shoulder/ left arm previously. Denies recent illness. Denies history of blood clots, recent surgeries, recent long immobilizations, and hormonal treatments. Chart reviewed. Patient was admitted to the hospital on 11/11/2017 for chest pain where he had a cath that showed 10% stenosis of LAD. He sees Dr. Harrington Challenger with cardiology. Patient denies abdominal pain and lower extremity edema. Denies fever and chills.     Past Medical History:  Diagnosis Date  . Allergy    allergic rhinitis  . Anxiety    xanax as needed   . Arthritis    R knee, hands , back   . Cancer ([place])    melonoma- on face, treated with excision   . Complication of anesthesia    trouble waking up with succycholine, does not have enzyme to break down succycholine  . Dizziness    MRI brain  unremarkable 02/05/2015.  Marland Kitchen GERD (gastroesophageal reflux disease)   . Headache(784.0)   . Hepatitis age 89   history of infections Hepatitis type a  . History of melanoma 15 yrs ago   face and forehead-- previously followed by Derm in [place]  . History of ulcerative colitis    non recent flares  . Hyperlipidemia   . Hypertension   . Sleep apnea    last sleep study 10 yrs ago,CPAP- intermittent use, not every night      Patient Active Problem List   Diagnosis Date Noted  . Overweight (BMI 25.0-29.9) 10/19/2018  . Abnormal stress test   . Cervical radiculopathy 08/14/2017  . Chronic use of opiate for therapeutic purpose 07/22/2017  . Ulnar neuropathy of both upper extremities 06/19/2017  . Numbness and tingling in both hands 06/16/2017  . Encounter for long-term opiate analgesic use 06/16/2017  . 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 Routine general medical examination at a health care facility 11/11/2016  . Oropharyngeal dysphagia 03/05/2016  . Lumbar stenosis with neurogenic claudication 09/12/2015  . Hypertension 05/10/2011  . BPH (benign prostatic hyperplasia) 08/18/2010  . OA (osteoarthritis) 11/15/2009  . CARPAL TUNNEL SYNDROME 08/07/2009  . SLEEP APNEA, OBSTRUCTIVE, MODERATE 03/22/2009  . Insomnia w/ sleep apnea 03/22/2009  . Hx of malignant melanoma 11/17/2008  . Hyperlipidemia LDL goal <100 11/17/2008  . ALLERGIC RHINITIS 11/17/2008    Past Surgical History:  Procedure Laterality Date  . acl and mensicus  repair reconstruct Right 1980's  . ANTERIOR CERVICAL DECOMP/DISCECTOMY FUSION  10/15/2011   Procedure: ANTERIOR CERVICAL DECOMPRESSION/DISCECTOMY FUSION 1 LEVEL;  Surgeon: [name], MD;  Location: [facility] [facility];  Service: Neurosurgery;  Laterality: N/A;  Cervical four-five Anterior cervical decompression/diskectomy/fusion  . APPENDECTOMY  1969  . COLONOSCOPY    . HERNIA REPAIR     inguinal / abdominal - repaired as an infant   . JOINT  REPLACEMENT    . KNEE ARTHROSCOPY Left last done oct 2013   x 3  . KNEE SURGERY  yrs ago   right knee  . LEFT HEART CATH AND CORONARY ANGIOGRAPHY N/A 11/14/2017   Procedure: LEFT HEART CATH AND CORONARY ANGIOGRAPHY;  Surgeon: [name], MD;  Location: [facility];  Service: Cardiovascular;  Laterality: N/A;  . LUMBAR LAMINECTOMY WITH COFLEX 2 LEVEL N/A 09/12/2015   Procedure: L3-4 L4-5 Laminectomy with coflex;  Surgeon: [name], MD;  Location: [facility];  Service: Neurosurgery;  Laterality: N/A;  L3-4 L4-5 Laminectomy with coflex  . NASAL SINUS SURGERY     multiple sinus surgery - 1980's   . TONSILLECTOMY  1969  . TOTAL KNEE ARTHROPLASTY Left 10/22/2012   Procedure: TOTAL KNEE ARTHROPLASTY;  Surgeon: [name], MD;  Location: [facility];  Service: Orthopedics;  Laterality: Left;       Family History  Problem Relation Age of Onset  . Cancer Brother        brain tumor  . Ovarian cancer Mother   . Diabetes Father   . Colon cancer Neg Hx   . Stomach cancer Neg Hx   . Rectal cancer Neg Hx   . Esophageal cancer Neg Hx   . Liver cancer Neg Hx     Social History   Tobacco Use  . Smoking status: Never Smoker  . Smokeless tobacco: Former Systems developer    Types: Chew  Substance Use Topics  . Alcohol use: No  . Drug use: No    Home Medications Prior to Admission medications   Medication Sig Start Date End Date Taking? Authorizing Provider  acetaminophen (TYLENOL) 650 MG CR tablet Take 1,300 mg by mouth every 8 (eight) hours as needed for pain.   Yes [provider]  amLODipine (NORVASC) 10 MG tablet Take 1 tablet (10 mg total) by mouth daily. 02/13/19  Yes Fay Records, MD  aspirin EC 81 MG tablet Take 1 tablet (81 mg total) by mouth daily. 10/24/12  Yes Babish, Rodman Key, [name]  co-enzyme Q-10 30 MG capsule Take 30 mg by mouth daily.   Yes [provider]  dutasteride (AVODART) 0.5 MG capsule TAKE 1 CAPSULE BY MOUTH EVERY DAY Patient taking  differently: Take 0.5 mg by mouth daily.  01/02/19  Yes [name], MD  esomeprazole (NEXIUM) 40 MG capsule TAKE 1 CAPSULE BY MOUTH EVERY DAY BEFORE BREAKFAST Patient taking differently: Take 40 mg by mouth daily before breakfast. TAKE 1 CAPSULE BY MOUTH EVERY DAY BEFORE BREAKFAST 02/15/19  Yes [name], MD  HYDROcodone-acetaminophen (NORCO) 10-325 MG tablet Take 1 tablet by mouth every 6 (six) hours as needed for severe pain. 04/12/19  Yes [name], MD  ibuprofen (ADVIL) 200 MG tablet Take 400 mg by mouth every 6 (six) hours as needed for moderate pain.   Yes [provider]  irbesartan (AVAPRO) 75 MG tablet TAKE 1 TABLET BY MOUTH EVERY DAY Patient taking differently: Take 75 mg by mouth daily.  11/19/18  Yes Tabori,  Aundra Millet, MD  Melatonin 5 MG TABS Take 1 tablet by mouth at bedtime.    Yes [provider]  rosuvastatin (CRESTOR) 10 MG tablet TAKE 1 TABLET BY MOUTH EVERY DAY Patient taking differently: Take 10 mg by mouth daily.  02/12/19  Yes [name], MD  tamsulosin (FLOMAX) 0.4 MG CAPS capsule Take 0.4 mg by mouth daily.    Yes [provider]  zolpidem (AMBIEN) 10 MG tablet TAKE 1 TABLET BY MOUTH EVERYDAY AT BEDTIME Patient taking differently: Take 10 mg by mouth at bedtime.  02/18/19  Yes [name], MD  methocarbamol (ROBAXIN) 500 MG tablet Take 1 tablet (500 mg total) by mouth 2 (two) times daily. 04/14/19   Suzy Bouchard, [name]    Allergies    Heparin, Succinylcholine chloride, Vancomycin, and Penicillins  Review of Systems   Review of Systems  Constitutional: Negative for chills and fever.  Respiratory: Negative for cough and shortness of breath.   Cardiovascular: Positive for chest pain.  Gastrointestinal: Negative for abdominal pain, diarrhea, nausea and vomiting.  Genitourinary: Negative for dysuria.  Musculoskeletal: Positive for arthralgias (left shoulder to elbow).  Neurological: Negative for  dizziness and headaches.  All other systems reviewed and are negative.   Physical Exam Updated Vital Signs BP (!) 164/96   Pulse 80   Temp 97.7 F (36.5 C) (Oral)   Resp 15   SpO2 97%   Physical Exam Vitals and nursing note reviewed.  Constitutional:      General: He is not in acute distress.    Appearance: He is not toxic-appearing.  HENT:     Head: Normocephalic.  Eyes:     Pupils: Pupils are equal, round, and reactive to light.  Cardiovascular:     Rate and Rhythm: Normal rate and regular rhythm.     Pulses: Normal pulses.     Heart sounds: Normal heart sounds. No murmur. No friction rub. No gallop.   Pulmonary:     Effort: Pulmonary effort is normal.     Breath sounds: Normal breath sounds.  Abdominal:     General: Abdomen is flat. Bowel sounds are normal. There is no distension.     Palpations: Abdomen is soft.     Tenderness: There is abdominal tenderness. There is no guarding or rebound.     Comments: Diffuse tenderness to palpation, most significant in LUQ. No focal tenderness. Negative Murphy's sign. No tenderness at McBurney's point. No guarding or rebound.  Musculoskeletal:     Cervical back: Neck supple.     Comments: Mild reproducible tenderness on anterior aspect of left shoulder. Full ROM of left shoulder and elbow. Neurovascularly intact. Soft compartments. No lower extremity edema. Negative homans sign bilaterally.   Skin:    General: Skin is warm and dry.  Neurological:     General: No focal deficit present.     Mental Status: He is alert.  Psychiatric:        Mood and Affect: Mood normal.        Behavior: Behavior normal.     ED Results / Procedures / Treatments   Labs (all labs ordered are listed, but only abnormal results are displayed) Labs Reviewed  BASIC METABOLIC PANEL - Abnormal; Notable for the following components:      Result Value   Sodium 131 (*)    Glucose, Bld 121 (*)    All other components within normal limits  CBC -  Abnormal; Notable for the following components:  Hemoglobin 12.8 (*)    HCT 38.5 (*)    All other components within normal limits  HEPATIC FUNCTION PANEL - Abnormal; Notable for the following components:   AST 14 (*)    All other components within normal limits  PROTIME-INR  TROPONIN I (HIGH SENSITIVITY)  TROPONIN I (HIGH SENSITIVITY)    EKG EKG Interpretation  Date/Time:  Wednesday April 14 2019 06:06:28 EST Ventricular Rate:  84 PR Interval:    QRS Duration: 103 QT Interval:  365 QTC Calculation: 432 R Axis:   -73 Text Interpretation: Sinus rhythm Borderline prolonged PR interval Left atrial enlargement Incomplete RBBB and LAFB Abnormal R-wave progression, late transition Confirmed by Thayer Jew [phone]) on 04/14/2019 6:20:24 AM   Radiology DG Chest 2 View  Result Date: 04/14/2019 CLINICAL DATA:  Chest pain. Elevated blood pressure. EXAM: CHEST - 2 VIEW COMPARISON:  11/11/2017 FINDINGS: The cardiomediastinal contours are normal. The lungs are clear. Pulmonary vasculature is normal. No consolidation, pleural effusion, or pneumothorax. No acute osseous abnormalities are seen. Surgical hardware in the cervical spine is partially included. IMPRESSION: No acute chest findings. Electronically Signed   By: Keith Rake M.D.   On: 04/14/2019 06:32    Procedures Procedures (including critical care time)  Medications Ordered in ED Medications  sodium chloride flush (NS) 0.9 % injection 3 mL (3 mLs Intravenous Not Given 04/14/19 0725)  acetaminophen (TYLENOL) tablet 650 mg (650 mg Oral Given 04/14/19 0725)  morphine 4 MG/ML injection 4 mg (4 mg Intravenous Given 04/14/19 0841)    ED Course  I have reviewed the triage vital signs and the nursing notes.  Pertinent labs & imaging results that were available during my care of the patient were reviewed by me and considered in my medical decision making (see chart for details).    MDM Rules/Calculators/A&P                      69 year old male presents to the ED for evaluation of left arm pain that radiates to left pectoral region x 1 days. Chart reviewed. Patient was admitted to the hospital on 11/11/2017 for chest pain where he had a cath that showed 10% stenosis of LAD. He sees Dr. Harrington Challenger with cardiology. Stable vitals with mild elevated in BP. Will continue to monitor. Patient in no acute distress and non-ill appearing. Physical exam reassuring. Chest pain sounds atypical in nature, but will obtain routine labs to rule out cardiac etiology. Suspect MSK vs. Cervical radiculopathy. Chart reviewed. Patient has a history of cervical radiculopathy.   CBC reassuring with no leukocytosis. CXR personally reviewed which is negative for signs of pneumonia, PTX, or widened mediastinum.EKG personally reviewed which demonstrates sinus rhythm with no signs of ischemia. BMP reassuring. Normal renal function. Initial troponin 3. Will obtain delta to rule out ACS.   Delta troponin flat. Low suspicion for ACS given normal troponins, EKG, and atypical nature. Low suspicion for PE and dissection. Suspect left arm pain coming from cervical radiculopathy. Discussed case with Dr. Stark Jock who evaluated patient at bedside and agrees with assessment and plan. Will discharge patient with muscle relaxer. Advised patient that medication can cause drowsiness and to not drive or operate machinery while on the medication. Patient is already on chronic narcotic for back pain. Advised patient to follow-up with cardiology within the next week. Also instructed patient to schedule an appointment with his neurosurgeon for further evaluation of radicular symptoms. Strict ED precautions discussed with patient. Patient states understanding  and agrees to plan. Patient discharged home in no acute distress and stable vitals   Final Clinical Impression(s) / ED Diagnoses Final diagnoses:  Left arm pain  Atypical chest pain    Rx / DC Orders ED Discharge Orders          Ordered    methocarbamol (ROBAXIN) 500 MG tablet  2 times daily     04/14/19 [place], Tyreon Frigon C, [name] 04/14/19 [id number]    Veryl Speak, MD 04/15/19 [phone]

## 2019-04-14 NOTE — ED Triage Notes (Signed)
Patient reports persistent left arm pain and left chest pain onset yesterday afternoon , denies SOB , no emesis or diaphoresis .

## 2019-05-04 ENCOUNTER — Ambulatory Visit: Payer: Medicare HMO | Attending: Internal Medicine

## 2019-05-04 DIAGNOSIS — Z23 Encounter for immunization: Secondary | ICD-10-CM

## 2019-05-04 NOTE — Progress Notes (Signed)
   Covid-19 Vaccination Clinic  Name:  Dylan [name]    MRN: [id number] DOB: 1950-06-01  05/04/2019  Mr. [name] was observed post Covid-19 immunization for 30 minutes based on pre-vaccination screening without incident. He was provided with Vaccine Information Sheet and instruction to access the V-Safe system.   Mr. [name] was instructed to call 911 with any severe reactions post vaccine: Marland Kitchen Difficulty breathing  . Swelling of face and throat  . A fast heartbeat  . A bad rash all over body  . Dizziness and weakness   Immunizations Administered    Name Date Dose VIS Date Route   Pfizer COVID-19 Vaccine 05/04/2019  2:41 PM 0.3 mL 02/05/2019 Intramuscular   Manufacturer: [place]   Lot: [id number]   [place]: [id number]

## 2019-05-09 ENCOUNTER — Encounter: Payer: Self-pay | Admitting: Family Medicine

## 2019-05-09 DIAGNOSIS — M4692 Unspecified inflammatory spondylopathy, cervical region: Secondary | ICD-10-CM

## 2019-05-09 DIAGNOSIS — M159 Polyosteoarthritis, unspecified: Secondary | ICD-10-CM

## 2019-05-10 ENCOUNTER — Other Ambulatory Visit: Payer: Self-pay | Admitting: Family Medicine

## 2019-05-10 DIAGNOSIS — M5412 Radiculopathy, cervical region: Secondary | ICD-10-CM

## 2019-05-10 DIAGNOSIS — M159 Polyosteoarthritis, unspecified: Secondary | ICD-10-CM

## 2019-05-10 DIAGNOSIS — M4692 Unspecified inflammatory spondylopathy, cervical region: Secondary | ICD-10-CM

## 2019-05-10 MED ORDER — HYDROCODONE-ACETAMINOPHEN 10-325 MG PO TABS: 1.0000 | ORAL_TABLET | Freq: Four times a day (QID) | ORAL | 0 refills | Status: DC | PRN

## 2019-05-10 NOTE — Telephone Encounter (Signed)
Duplicate Rx refill.

## 2019-05-10 NOTE — Telephone Encounter (Signed)
Last OV 10/19/18 Hydrocodone last filled 04/12/19 #120 with 0

## 2019-05-13 ENCOUNTER — Other Ambulatory Visit: Payer: Self-pay | Admitting: Internal Medicine

## 2019-05-17 ENCOUNTER — Other Ambulatory Visit: Payer: Self-pay | Admitting: Family Medicine

## 2019-05-17 DIAGNOSIS — G4730 Sleep apnea, unspecified: Secondary | ICD-10-CM

## 2019-05-17 DIAGNOSIS — G4700 Insomnia, unspecified: Secondary | ICD-10-CM

## 2019-05-18 ENCOUNTER — Encounter: Payer: Self-pay | Admitting: Family Medicine

## 2019-05-18 NOTE — Telephone Encounter (Signed)
Last OV 10/19/18 Zolpidem last filled 02/18/19 #30 with 2

## 2019-05-26 ENCOUNTER — Telehealth: Payer: Self-pay | Admitting: Family Medicine

## 2019-05-26 NOTE — Progress Notes (Signed)
°  Chronic Care Management   Note  05/26/2019 Name: JEROMI ACHORD MRN: [id number] DOB: 01/20/1951  RASHAWD HILDEBRANDT is a 69 y.o. year old male who is a primary care patient of Birdie Riddle, Aundra Millet, MD. I reached out to Jena Gauss by phone today in response to a referral sent by Mr. Elyse Jarvis Beamon's PCP, [name], MD.   Mr. Morten was given information about Chronic Care Management services today including:  1. CCM service includes personalized support from designated clinical staff supervised by his physician, including individualized plan of care and coordination with other care providers 2. 24/7 contact phone numbers for assistance for urgent and routine care needs. 3. Service will only be billed when office clinical staff spend 20 minutes or more in a month to coordinate care. 4. Only one practitioner may furnish and bill the service in a calendar month. 5. The patient may stop CCM services at any time (effective at the end of the month) by phone call to the office staff.   Patient agreed to services and verbal consent obtained.   Follow up plan:   Earney [facility] Scheduler

## 2019-05-27 DIAGNOSIS — M5136 Other intervertebral disc degeneration, lumbar region: Secondary | ICD-10-CM | POA: Diagnosis not present

## 2019-05-28 ENCOUNTER — Encounter: Payer: Self-pay | Admitting: Family Medicine

## 2019-05-31 ENCOUNTER — Telehealth: Payer: Medicare HMO

## 2019-05-31 NOTE — Patient Instructions (Incomplete)
Visit Information  Goals Addressed   None     Dylan [name] was given information about Chronic Care Management services today including:  1. CCM service includes personalized support from designated clinical staff supervised by his physician, including individualized plan of care and coordination with other care providers 2. 24/7 contact phone numbers for assistance for urgent and routine care needs. 3. Standard insurance, coinsurance, copays and deductibles apply for chronic care management only during months in which we provide at least 20 minutes of these services. Most insurances cover these services at 100%, however patients may be responsible for any copay, coinsurance and/or deductible if applicable. This service may help you avoid the need for more expensive face-to-face services. 4. Only one practitioner may furnish and bill the service in a calendar month. 5. The patient may stop CCM services at any time (effective at the end of the month) by phone call to the office staff.  Patient agreed to services and verbal consent obtained.   {CCM PT PRINT INSTRUCTIONS:22237} {USCCMFUPLAN:23560}  SIGNATURE***

## 2019-05-31 NOTE — Progress Notes (Unsigned)
   Chronic Care Management Pharmacy  Name: Dylan [name]  MRN: [id number] DOB: 1950/03/05  Initial Planning Appointment: ***  Initial Questions: 1. Have you seen any other providers since your last visit? {YES/NO:21197} 2. Any changes in your medicines or health? {YES/NO:21197}  Chief Complaint/ HPI  Dylan [name],  69 y.o. , male presents for their {Initial/Follow-up:3041532} CCM visit with the clinical pharmacist {CHL HP [facility] Pharm visit [id number].  PCP : [name], MD  Their chronic conditions include: {CHL AMB CHRONIC MEDICAL CONDITIONS:[phone]}  Office Visits:***  Consult Visit:***  Medications: Outpatient Encounter Medications as of 05/31/2019  Medication Sig  . acetaminophen (TYLENOL) 650 MG CR tablet Take 1,300 mg by mouth every 8 (eight) hours as needed for pain.  Marland Kitchen amLODipine (NORVASC) 10 MG tablet TAKE 1 TABLET BY MOUTH EVERY DAY  . aspirin EC 81 MG tablet Take 1 tablet (81 mg total) by mouth daily.  Marland Kitchen co-enzyme Q-10 30 MG capsule Take 30 mg by mouth daily.  Marland Kitchen dutasteride (AVODART) 0.5 MG capsule TAKE 1 CAPSULE BY MOUTH EVERY DAY (Patient taking differently: Take 0.5 mg by mouth daily. )  . esomeprazole (NEXIUM) 40 MG capsule TAKE 1 CAPSULE BY MOUTH EVERY DAY BEFORE BREAKFAST (Patient taking differently: Take 40 mg by mouth daily before breakfast. TAKE 1 CAPSULE BY MOUTH EVERY DAY BEFORE BREAKFAST)  . HYDROcodone-acetaminophen (NORCO) 10-325 MG tablet Take 1 tablet by mouth every 6 (six) hours as needed for severe pain.  Marland Kitchen ibuprofen (ADVIL) 200 MG tablet Take 400 mg by mouth every 6 (six) hours as needed for moderate pain.  Marland Kitchen irbesartan (AVAPRO) 75 MG tablet TAKE 1 TABLET BY MOUTH EVERY DAY  . Melatonin 5 MG TABS Take 1 tablet by mouth at bedtime.   . methocarbamol (ROBAXIN) 500 MG tablet Take 1 tablet (500 mg total) by mouth 2 (two) times daily.  . rosuvastatin (CRESTOR) 10 MG tablet TAKE 1 TABLET BY MOUTH EVERY DAY (Patient taking differently:  Take 10 mg by mouth daily. )  . tamsulosin (FLOMAX) 0.4 MG CAPS capsule Take 0.4 mg by mouth daily.   Marland Kitchen zolpidem (AMBIEN) 10 MG tablet TAKE 1 TABLET BY MOUTH EVERYDAY AT BEDTIME   No facility-administered encounter medications on file as of 05/31/2019.     Current Diagnosis/Assessment:  Goals Addressed   None     {CHL [facility] Diagnosis/[phone]}

## 2019-06-08 ENCOUNTER — Other Ambulatory Visit: Payer: Self-pay | Admitting: Family Medicine

## 2019-06-08 DIAGNOSIS — M4692 Unspecified inflammatory spondylopathy, cervical region: Secondary | ICD-10-CM

## 2019-06-08 DIAGNOSIS — M159 Polyosteoarthritis, unspecified: Secondary | ICD-10-CM

## 2019-06-08 NOTE — Telephone Encounter (Signed)
Last OV 10/19/18 Hydrocodone last filled 05/10/19 #120 with 0

## 2019-06-09 ENCOUNTER — Other Ambulatory Visit: Payer: Self-pay | Admitting: Family Medicine

## 2019-06-09 ENCOUNTER — Encounter: Payer: Self-pay | Admitting: Family Medicine

## 2019-06-09 DIAGNOSIS — M4692 Unspecified inflammatory spondylopathy, cervical region: Secondary | ICD-10-CM

## 2019-06-09 DIAGNOSIS — M159 Polyosteoarthritis, unspecified: Secondary | ICD-10-CM

## 2019-06-09 DIAGNOSIS — E785 Hyperlipidemia, unspecified: Secondary | ICD-10-CM

## 2019-06-09 MED ORDER — HYDROCODONE-ACETAMINOPHEN 10-325 MG PO TABS: 1.0000 | ORAL_TABLET | Freq: Four times a day (QID) | ORAL | 0 refills | Status: DC | PRN

## 2019-06-09 NOTE — Telephone Encounter (Signed)
This is a duplicate Rx and it will not let me delete.

## 2019-07-06 ENCOUNTER — Other Ambulatory Visit: Payer: Self-pay | Admitting: Family Medicine

## 2019-07-06 DIAGNOSIS — M4692 Unspecified inflammatory spondylopathy, cervical region: Secondary | ICD-10-CM

## 2019-07-06 DIAGNOSIS — M5412 Radiculopathy, cervical region: Secondary | ICD-10-CM

## 2019-07-06 DIAGNOSIS — M159 Polyosteoarthritis, unspecified: Secondary | ICD-10-CM

## 2019-07-06 NOTE — Telephone Encounter (Signed)
Last OV 10/19/18 Hydrocodone last filled 06/09/19 #120 with 0

## 2019-07-07 ENCOUNTER — Other Ambulatory Visit: Payer: Self-pay

## 2019-07-07 ENCOUNTER — Ambulatory Visit (INDEPENDENT_AMBULATORY_CARE_PROVIDER_SITE_OTHER): Payer: Medicare HMO | Admitting: Family Medicine

## 2019-07-07 ENCOUNTER — Encounter: Payer: Self-pay | Admitting: Family Medicine

## 2019-07-07 VITALS — BP 139/72 | HR 101 | Temp 98.5°F | Resp 17 | Ht 72.0 in | Wt 220.4 lb

## 2019-07-07 DIAGNOSIS — J309 Allergic rhinitis, unspecified: Secondary | ICD-10-CM

## 2019-07-07 DIAGNOSIS — F4321 Adjustment disorder with depressed mood: Secondary | ICD-10-CM

## 2019-07-07 DIAGNOSIS — H60502 Unspecified acute noninfective otitis externa, left ear: Secondary | ICD-10-CM | POA: Diagnosis not present

## 2019-07-07 MED ORDER — NEOMYCIN-POLYMYXIN-HC 3.5-10000-1 OT SUSP: 4.0000 [drp] | Freq: Three times a day (TID) | OTIC | 0 refills | Status: DC

## 2019-07-07 MED ORDER — FLUTICASONE PROPIONATE 50 MCG/ACT NA SUSP: 2.0000 | Freq: Every day | NASAL | 6 refills | Status: DC

## 2019-07-07 MED ORDER — HYDROCODONE-ACETAMINOPHEN 10-325 MG PO TABS: 1.0000 | ORAL_TABLET | Freq: Four times a day (QID) | ORAL | 0 refills | Status: DC | PRN

## 2019-07-07 MED ORDER — ALPRAZOLAM 0.25 MG PO TABS: 0.2500 mg | ORAL_TABLET | Freq: Two times a day (BID) | ORAL | 0 refills | Status: DC | PRN

## 2019-07-07 NOTE — Progress Notes (Signed)
   Subjective:    Patient ID: Dylan [name], male    DOB: 13-Oct-1950, 69 y.o.   MRN: [id number]  HPI L ear congestion- 'it's swollen up'.  + dizziness.  + nasal congestion, PND.  sxs started ~1 week ago.  'I could barely get a qtip in my ear'.  Pain w/ manipulation of pinna.  No fever.  No drainage from ear.  Seasonal allergies- + nasal congestion, PND, itchy/watery eyes.  Not currently taking medication  Grief- wife recently passed and her Onnie Boer is upcoming (5/22) Asking for something to help w/ his anxiety   Review of Systems For ROS see HPI   This visit occurred during the SARS-CoV-2 public health emergency.  Safety protocols were in place, including screening questions prior to the visit, additional usage of staff PPE, and extensive cleaning of exam room while observing appropriate contact time as indicated for disinfecting solutions.       Objective:   Physical Exam Vitals reviewed.  Constitutional:      General: He is not in acute distress.    Appearance: He is well-developed.  HENT:     Head: Normocephalic and atraumatic.     Right Ear: Tympanic membrane and ear canal normal.     Left Ear: Tympanic membrane normal.     Ears:     Comments: L EAC swollen and red, painful to manipulate pinna Eyes:     Conjunctiva/sclera: Conjunctivae normal.     Pupils: Pupils are equal, round, and reactive to light.  Cardiovascular:     Rate and Rhythm: Normal rate and regular rhythm.     Heart sounds: Normal heart sounds.  Pulmonary:     Effort: Pulmonary effort is normal. No respiratory distress.     Breath sounds: Normal breath sounds. No wheezing.  Musculoskeletal:     Cervical back: Normal range of motion and neck supple.  Lymphadenopathy:     Cervical: No cervical adenopathy.  Skin:    General: Skin is warm and dry.  Psychiatric:        Mood and Affect: Mood normal.        Behavior: Behavior normal.        Thought Content: Thought content normal.             Assessment & Plan:  L otitis externa- pt has had similar sxs previously.  No evidence of OM so will start w/ Cortisporin Otic suspension.  Pt expressed understanding and is in agreement w/ plan.   Allergic Rhinitis- deteriorated.  Pt is not using any allergy products at this time.  He says antihistamines leave him feeling foggy so will start nasal steroid.  Pt expressed understanding and is in agreement w/ plan.   Grief- wife passed recently and her memorial service is upcoming.  He reports he is having high anxiety moments and is asking for something to help him.  Cautioned that Benzos are not to be used in combination with pain meds or alcohol due to risk of respiratory depression and death.  Pt expressed understanding and is in agreement w/ plan.

## 2019-07-07 NOTE — Patient Instructions (Signed)
Follow up as needed or as scheduled START the ear drops- 4 drops 3x/day x7days USE Flonase- 2 sprays each nostril daily- to improve nasal congestion and drainage Please consider grief counseling- Hospice has great services Use the Alprazolam as needed for high anxiety moments- this is not to be used regularly and is extremely dangerous when combined w/ pain meds or alcohol Call with any questions or concerns Hang in there!

## 2019-07-15 ENCOUNTER — Other Ambulatory Visit: Payer: Self-pay | Admitting: Family Medicine

## 2019-07-16 MED ORDER — ALPRAZOLAM 0.25 MG PO TABS: ORAL_TABLET | ORAL | 0 refills | Status: DC

## 2019-07-16 NOTE — Telephone Encounter (Signed)
Xanax last rx 07/07/19 #20 LOV: 07/07/19

## 2019-08-03 ENCOUNTER — Other Ambulatory Visit: Payer: Self-pay | Admitting: Family Medicine

## 2019-08-03 DIAGNOSIS — M4692 Unspecified inflammatory spondylopathy, cervical region: Secondary | ICD-10-CM

## 2019-08-03 DIAGNOSIS — M159 Polyosteoarthritis, unspecified: Secondary | ICD-10-CM

## 2019-08-03 MED ORDER — HYDROCODONE-ACETAMINOPHEN 10-325 MG PO TABS: 1.0000 | ORAL_TABLET | Freq: Four times a day (QID) | ORAL | 0 refills | Status: DC | PRN

## 2019-08-03 NOTE — Telephone Encounter (Signed)
Last OV 07/07/19 Hydrocodone last filled 07/07/19 #120 with 0

## 2019-08-05 ENCOUNTER — Other Ambulatory Visit: Payer: Self-pay | Admitting: Family Medicine

## 2019-08-05 DIAGNOSIS — G4730 Sleep apnea, unspecified: Secondary | ICD-10-CM

## 2019-08-06 NOTE — Telephone Encounter (Signed)
Last OV 07/07/19 Zolpidem last filled 05/19/19 #30 with 2

## 2019-08-10 ENCOUNTER — Encounter: Payer: Self-pay | Admitting: Family Medicine

## 2019-08-10 DIAGNOSIS — G4700 Insomnia, unspecified: Secondary | ICD-10-CM

## 2019-08-10 NOTE — Telephone Encounter (Signed)
Last OV 07/07/19 ambien last filled 05/19/19 #30 with 2

## 2019-08-11 ENCOUNTER — Encounter: Payer: Self-pay | Admitting: Family Medicine

## 2019-08-11 MED ORDER — ZOLPIDEM TARTRATE 10 MG PO TABS: ORAL_TABLET | ORAL | 2 refills | Status: DC

## 2019-08-12 DIAGNOSIS — M5136 Other intervertebral disc degeneration, lumbar region: Secondary | ICD-10-CM | POA: Diagnosis not present

## 2019-08-31 ENCOUNTER — Other Ambulatory Visit: Payer: Self-pay | Admitting: Family Medicine

## 2019-08-31 DIAGNOSIS — M159 Polyosteoarthritis, unspecified: Secondary | ICD-10-CM

## 2019-08-31 DIAGNOSIS — M4692 Unspecified inflammatory spondylopathy, cervical region: Secondary | ICD-10-CM

## 2019-08-31 MED ORDER — HYDROCODONE-ACETAMINOPHEN 10-325 MG PO TABS: 1.0000 | ORAL_TABLET | Freq: Four times a day (QID) | ORAL | 0 refills | Status: DC | PRN

## 2019-08-31 NOTE — Telephone Encounter (Signed)
Hydrocodone LFD 08/13/19 # 120 with no refills LOV 07/07/19 NOV none

## 2019-09-17 DIAGNOSIS — M5136 Other intervertebral disc degeneration, lumbar region: Secondary | ICD-10-CM | POA: Diagnosis not present

## 2019-09-27 ENCOUNTER — Other Ambulatory Visit: Payer: Self-pay | Admitting: Physician Assistant

## 2019-09-27 ENCOUNTER — Ambulatory Visit: Payer: Medicare HMO

## 2019-09-27 DIAGNOSIS — M159 Polyosteoarthritis, unspecified: Secondary | ICD-10-CM

## 2019-09-27 DIAGNOSIS — M5412 Radiculopathy, cervical region: Secondary | ICD-10-CM

## 2019-09-27 DIAGNOSIS — M4692 Unspecified inflammatory spondylopathy, cervical region: Secondary | ICD-10-CM

## 2019-09-27 MED ORDER — HYDROCODONE-ACETAMINOPHEN 10-325 MG PO TABS: 1.0000 | ORAL_TABLET | Freq: Four times a day (QID) | ORAL | 0 refills | Status: DC | PRN

## 2019-09-27 NOTE — Telephone Encounter (Signed)
Last OV 07/07/19 Hydrocodone last filled 08/31/19 #120 with 0

## 2019-09-30 NOTE — Progress Notes (Signed)
Subjective:   Dylan [name] is a 69 y.o. male who presents for an Initial Medicare Annual Wellness Visit.  I connected with Kaiven today by telephone and verified that I am speaking with the correct person using two identifiers. Location patient: home Location provider: work Persons participating in the virtual visit: patient, Marine scientist.    I discussed the limitations, risks, security and privacy concerns of performing an evaluation and management service by telephone and the availability of in person appointments. I also discussed with the patient that there may be a patient responsible charge related to this service. The patient expressed understanding and verbally consented to this telephonic visit.    Interactive audio and video telecommunications were attempted between this provider and patient, however failed, due to patient having technical difficulties OR patient did not have access to video capability.  We continued and completed visit with audio only.  Some vital signs may be absent or patient reported.   Time Spent with patient on telephone encounter: 25 minutes  Review of Systems     Cardiac Risk Factors include: advanced age (>76men, >74 women);dyslipidemia;hypertension;obesity (BMI >30kg/m2);male gender     Objective:    Today's Vitals   10/04/19 0814  Weight: 224 lb (101.6 kg)  Height: 6' (1.829 m)  PainSc: 4    Body mass index is 30.38 kg/m.  Advanced Directives 10/04/2019 04/14/2019 11/11/2017 11/11/2017 10/03/2017 05/06/2017 06/18/2016  Does Patient Have a Medical Advance Directive? No No No No No No No  Would patient like information on creating a medical advance directive? Yes (MAU/Ambulatory/Procedural Areas - Information given) No - Patient declined No - Patient declined - No - Patient declined No - Patient declined -  Pre-existing out of facility DNR order (yellow form or pink MOST form) - - - - - - -    Current Medications (verified) Outpatient Encounter  Medications as of 10/04/2019  Medication Sig  . acetaminophen (TYLENOL) 650 MG CR tablet Take 1,300 mg by mouth every 8 (eight) hours as needed for pain.  Marland Kitchen ALPRAZolam (XANAX) 0.25 MG tablet 1 tab twice daily AS NEEDED for anxiety.  This is NOT to be used regularly  . amLODipine (NORVASC) 10 MG tablet TAKE 1 TABLET BY MOUTH EVERY DAY  . aspirin EC 81 MG tablet Take 1 tablet (81 mg total) by mouth daily.  Marland Kitchen co-enzyme Q-10 30 MG capsule Take 30 mg by mouth daily.  Marland Kitchen dutasteride (AVODART) 0.5 MG capsule TAKE 1 CAPSULE BY MOUTH EVERY DAY (Patient taking differently: Take 0.5 mg by mouth daily. )  . esomeprazole (NEXIUM) 40 MG capsule TAKE 1 CAPSULE BY MOUTH EVERY DAY BEFORE BREAKFAST (Patient taking differently: Take 40 mg by mouth daily before breakfast. TAKE 1 CAPSULE BY MOUTH EVERY DAY BEFORE BREAKFAST)  . fluticasone (FLONASE) 50 MCG/ACT nasal spray Place 2 sprays into both nostrils daily.  Marland Kitchen HYDROcodone-acetaminophen (NORCO) 10-325 MG tablet Take 1 tablet by mouth every 6 (six) hours as needed for severe pain.  Marland Kitchen ibuprofen (ADVIL) 200 MG tablet Take 400 mg by mouth every 6 (six) hours as needed for moderate pain.  Marland Kitchen irbesartan (AVAPRO) 75 MG tablet TAKE 1 TABLET BY MOUTH EVERY DAY  . Melatonin 5 MG TABS Take 1 tablet by mouth at bedtime.   . methocarbamol (ROBAXIN) 500 MG tablet Take 1 tablet (500 mg total) by mouth 2 (two) times daily.  . rosuvastatin (CRESTOR) 10 MG tablet TAKE 1 TABLET BY MOUTH EVERY DAY  . tamsulosin (FLOMAX) 0.4 MG CAPS  capsule Take 0.4 mg by mouth daily.   Marland Kitchen zolpidem (AMBIEN) 10 MG tablet TAKE 1 TABLET BY MOUTH EVERYDAY AT BEDTIME  . neomycin-polymyxin-hydrocortisone (CORTISPORIN) 3.5-10000-1 OTIC suspension Place 4 drops into the left ear 3 (three) times daily. (Patient not taking: Reported on 10/04/2019)   No facility-administered encounter medications on file as of 10/04/2019.    Allergies (verified) Heparin, Succinylcholine chloride, Vancomycin, and Penicillins    History: Past Medical History:  Diagnosis Date  . Allergy    allergic rhinitis  . Anxiety    xanax as needed   . Arthritis    R knee, hands , back   . Cancer ([place])    melonoma- on face, treated with excision   . Complication of anesthesia    trouble waking up with succycholine, does not have enzyme to break down succycholine  . Dizziness    MRI brain unremarkable 02/05/2015.  Marland Kitchen GERD (gastroesophageal reflux disease)   . Headache(784.0)   . Hepatitis age 61   history of infections Hepatitis type a  . History of melanoma 15 yrs ago   face and forehead-- previously followed by Derm in [place]  . History of ulcerative colitis    non recent flares  . Hyperlipidemia   . Hypertension   . Sleep apnea    last sleep study 10 yrs ago,CPAP- intermittent use, not every night     Past Surgical History:  Procedure Laterality Date  . acl and mensicus repair reconstruct Right 1980's  . ANTERIOR CERVICAL DECOMP/DISCECTOMY FUSION  10/15/2011   Procedure: ANTERIOR CERVICAL DECOMPRESSION/DISCECTOMY FUSION 1 LEVEL;  Surgeon: [name], MD;  Location: [facility];  Service: Neurosurgery;  Laterality: N/A;  Cervical four-five Anterior cervical decompression/diskectomy/fusion  . APPENDECTOMY  1969  . COLONOSCOPY    . HERNIA REPAIR     inguinal / abdominal - repaired as an infant   . JOINT REPLACEMENT    . KNEE ARTHROSCOPY Left last done oct 2013   x 3  . KNEE SURGERY  yrs ago   right knee  . LEFT HEART CATH AND CORONARY ANGIOGRAPHY N/A 11/14/2017   Procedure: LEFT HEART CATH AND CORONARY ANGIOGRAPHY;  Surgeon: [name], MD;  Location: [facility];  Service: Cardiovascular;  Laterality: N/A;  . LUMBAR LAMINECTOMY WITH COFLEX 2 LEVEL N/A 09/12/2015   Procedure: L3-4 L4-5 Laminectomy with coflex;  Surgeon: [name], MD;  Location: [facility];  Service: Neurosurgery;  Laterality: N/A;  L3-4 L4-5 Laminectomy with coflex  . NASAL SINUS SURGERY     multiple sinus surgery  - 1980's   . TONSILLECTOMY  1969  . TOTAL KNEE ARTHROPLASTY Left 10/22/2012   Procedure: TOTAL KNEE ARTHROPLASTY;  Surgeon: [name], MD;  Location: [facility];  Service: Orthopedics;  Laterality: Left;   Family History  Problem Relation Age of Onset  . Cancer Brother        brain tumor  . Ovarian cancer Mother   . Diabetes Father   . Colon cancer Neg Hx   . Stomach cancer Neg Hx   . Rectal cancer Neg Hx   . Esophageal cancer Neg Hx   . Liver cancer Neg Hx    Social History   Socioeconomic History  . Marital status: Widowed    Spouse name: Not on file  . Number of children: 3  . Years of education: Not on file  . Highest education level: Not on file  Occupational History  . Occupation: retired  Tobacco Use  .  Smoking status: Never Smoker  . Smokeless tobacco: Former Systems developer    Types: Secondary school teacher  . Vaping Use: Never used  Substance and Sexual Activity  . Alcohol use: No  . Drug use: No  . Sexual activity: Not on file  Other Topics Concern  . Not on file  Social History Narrative   Wife works at cath lab at [employer] advanced breast cancer   Lives with wife in a 2 story home.  Retired.  Education: college.    Social Determinants of Health   Financial Resource Strain: Low Risk   . Difficulty of Paying Living Expenses: Not hard at all  Food Insecurity: No Food Insecurity  . Worried About Charity fundraiser in the Last Year: Never true  . Ran Out of Food in the Last Year: Never true  Transportation Needs: No Transportation Needs  . Lack of Transportation (Medical): No  . Lack of Transportation (Non-Medical): No  Physical Activity: Sufficiently Active  . Days of Exercise per Week: 7 days  . Minutes of Exercise per Session: 40 min  Stress: No Stress Concern Present  . Feeling of Stress : Not at all  Social Connections: Moderately Integrated  . Frequency of Communication with Friends and Family: More than three times a week  . Frequency of Social Gatherings  with Friends and Family: More than three times a week  . Attends Religious Services: More than 4 times per year  . Active Member of Clubs or Organizations: Yes  . Attends Archivist Meetings: More than 4 times per year  . Marital Status: Widowed    Tobacco Counseling Counseling given: Not Answered   Clinical Intake:  Pre-visit preparation completed: Yes  Pain : 0-10 Pain Score: 4  Pain Type: Chronic pain Pain Location: Back Pain Onset: More than a month ago Pain Frequency: Constant     Nutritional Status: BMI > 30  Obese Nutritional Risks: None Diabetes: No  How often do you need to have someone help you when you read instructions, pamphlets, or other written materials from your doctor or pharmacy?: 1 - Never What is the last grade level you completed in school?: Business degree  Diabetic?No  Interpreter Needed?: No  Information entered by :: Caroleen Hamman LPN   Activities of Daily Living In your present state of health, do you have any difficulty performing the following activities: 10/04/2019 07/07/2019  Hearing? N N  Vision? N N  Difficulty concentrating or making decisions? N N  Walking or climbing stairs? N N  Dressing or bathing? N N  Doing errands, shopping? N N  Preparing Food and eating ? N -  Using the Toilet? N -  In the past six months, have you accidently leaked urine? N -  Do you have problems with loss of bowel control? N -  Managing your Medications? N -  Managing your Finances? N -  Housekeeping or managing your Housekeeping? N -  Some recent data might be hidden    Patient Care Team: [name], MD as PCP - General (Family Medicine) Fay Records, MD as PCP - Cardiology (Cardiology) Alda Berthold, DO as Consulting Physician (Neurology) Iran Planas, MD as Consulting Physician (Orthopedic Surgery) Madelin Rear, [facility] as Pharmacist (Pharmacist)  Indicate any recent Medical Services you may have received from other than  [facility] providers in the past year (date may be approximate).     Assessment:   This is a routine wellness examination for Dennys.  Hearing/Vision screen  Hearing Screening   125Hz  250Hz  500Hz  1000Hz  2000Hz  3000Hz  4000Hz  6000Hz  8000Hz   Right ear:           Left ear:           Comments: Has hearing aids but does not where them all the time  Vision Screening Comments: Wears glasses Last eye exam 6 months ago [place] issues and exercise activities discussed: Current Exercise Habits: Home exercise routine, Type of exercise: walking, Time (Minutes): 45, Frequency (Times/Week): 7, Weekly Exercise (Minutes/Week): 315, Intensity: Mild  Goals    . Patient Stated     Maintain current health & level of activity      Depression Screen PHQ 2/9 Scores 10/04/2019 10/19/2018 07/29/2018 07/15/2018 04/21/2018 04/10/2018 08/20/2017  PHQ - 2 Score 0 0 0 0 0 0 0  PHQ- 9 Score - 0 - - - 0 0    Fall Risk Fall Risk  10/04/2019 07/07/2019 10/19/2018 07/29/2018 07/15/2018  Falls in the past year? 0 0 0 0 0  Number falls in past yr: 0 0 0 0 0  Injury with Fall? 0 0 0 0 0  Follow up Falls prevention discussed Falls evaluation completed - - -    Any stairs in or around the home? Yes  If so, are there any without handrails? No  Home free of loose throw rugs in walkways, pet beds, electrical cords, etc? Yes  Adequate lighting in your home to reduce risk of falls? Yes   ASSISTIVE DEVICES UTILIZED TO PREVENT FALLS:  Life alert? No  Use of a cane, walker or w/c? No  Grab bars in the bathroom? No pt states he has a non slip mat in shower Shower chair or bench in shower? No  Elevated toilet seat or a handicapped toilet? No   TIMED UP AND GO:  Was the test performed? No . Phone visit   Cognitive Function: No cognitive impairment noted.        Immunizations Immunization History  Administered Date(s) Administered  . Influenza Whole 11/08/2008, 11/15/2009  . Influenza, High Dose Seasonal PF  11/01/2017  . Influenza,inj,Quad PF,6+ Mos 11/17/2012, 10/31/2013, 10/19/2018  . Influenza-Unspecified 10/29/2014, 10/12/2015, 10/28/2016  . PFIZER SARS-COV-2 Vaccination 04/11/2019, 05/04/2019  . Pneumococcal Conjugate-13 03/05/2016  . Pneumococcal Polysaccharide-23 10/16/2011, 03/05/2016, 10/19/2018  . Td 11/18/2000  . Tdap 03/05/2016    TDAP status: Up to date   Flu Vaccine status: Up to date   Pneumococcal vaccine status: Up to date   Covid-19 vaccine status: Completed vaccines  Qualifies for Shingles Vaccine? Yes   Zostavax completed Yes per patient Shingrix Completed?: No.    Education has been provided regarding the importance of this vaccine. Patient has been advised to call insurance company to determine out of pocket expense if they have not yet received this vaccine. Advised may also receive vaccine at local pharmacy or Health Dept. Verbalized acceptance and understanding.  Screening Tests Health Maintenance  Topic Date Due  . INFLUENZA VACCINE  09/26/2019  . COLONOSCOPY  06/25/2021  . TETANUS/TDAP  03/05/2026  . COVID-19 Vaccine  Completed  . Hepatitis C Screening  Completed  . PNA vac Low Risk Adult  Completed    Health Maintenance  Health Maintenance Due  Topic Date Due  . INFLUENZA VACCINE  09/26/2019    Colorectal cancer screening: Completed 06/25/2016. Repeat every 5 years  Lung Cancer Screening: (Low Dose CT Chest recommended if Age 73-80 years, 30 pack-year currently smoking OR have  quit w/in 15years.) does not qualify.     Additional Screening:  Hepatitis C Screening:  Completed 11/11/2016  Vision Screening: Recommended annual ophthalmology exams for early detection of glaucoma and other disorders of the eye. Is the patient up to date with their annual eye exam?  Yes  Who is the provider or what is the name of the office in which the patient attends annual eye exams? [facility]   Dental Screening: Recommended annual dental exams for proper  oral hygiene  Community Resource Referral / Chronic Care Management: CRR required this visit?  No   CCM required this visit?  No      Plan:     I have personally reviewed and noted the following in the patient's chart:   . Medical and social history . Use of alcohol, tobacco or illicit drugs  . Current medications and supplements . Functional ability and status . Nutritional status . Physical activity . Advanced directives . List of other physicians . Hospitalizations, surgeries, and ER visits in previous 12 months . Vitals . Screenings to include cognitive, depression, and falls . Referrals and appointments  In addition, I have reviewed and discussed with patient certain preventive protocols, quality metrics, and best practice recommendations. A written personalized care plan for preventive services as well as general preventive health recommendations were provided to patient.  Due to this being a telephonic visit, the after visit summary with patients personalized plan was offered to patient via mail or my-chart.  Patient would like to access on my-chart.     Marta Antu, LPN   05/27/7406  Nurse Health Advisor  Nurse Notes: None

## 2019-10-04 ENCOUNTER — Ambulatory Visit (INDEPENDENT_AMBULATORY_CARE_PROVIDER_SITE_OTHER): Payer: Medicare HMO

## 2019-10-04 VITALS — Ht 72.0 in | Wt 224.0 lb

## 2019-10-04 DIAGNOSIS — Z0000 Encounter for general adult medical examination without abnormal findings: Secondary | ICD-10-CM | POA: Diagnosis not present

## 2019-10-04 NOTE — Patient Instructions (Signed)
Dylan [name] , Thank you for taking time to complete your Medicare Wellness Visit. I appreciate your ongoing commitment to your health goals. Please review the following plan we discussed and let me know if I can assist you in the future.   Screening recommendations/referrals: Colonoscopy: 06/25/2016-Due 06/25/2021 Recommended yearly ophthalmology/optometry visit for glaucoma screening and checkup Recommended yearly dental visit for hygiene and checkup  Vaccinations: Influenza vaccine: Due 10/2019 Pneumococcal vaccine: Completed vaccines Tdap vaccine: Up to date-Due 03/05/2026 Shingles vaccine: Discuss with pharmacy   Covid-19: Completed vaccines  Advanced directives: Discussed. Information mailed today.  Conditions/risks identified: See problem list  Next appointment: Follow up in one year for your annual wellness visit. 10/09/2020 @ 8:15 am  Preventive Care 65 Years and Older, Male Preventive care refers to lifestyle choices and visits with your health care provider that can promote health and wellness. What does preventive care include?  A yearly physical exam. This is also called an annual well check.  Dental exams once or twice a year.  Routine eye exams. Ask your health care provider how often you should have your eyes checked.  Personal lifestyle choices, including:  Daily care of your teeth and gums.  Regular physical activity.  Eating a healthy diet.  Avoiding tobacco and drug use.  Limiting alcohol use.  Practicing safe sex.  Taking low doses of aspirin every day.  Taking vitamin and mineral supplements as recommended by your health care provider. What happens during an annual well check? The services and screenings done by your health care provider during your annual well check will depend on your age, overall health, lifestyle risk factors, and family history of disease. Counseling  Your health care provider may ask you questions about your:  Alcohol  use.  Tobacco use.  Drug use.  Emotional well-being.  Home and relationship well-being.  Sexual activity.  Eating habits.  History of falls.  Memory and ability to understand (cognition).  Work and work Statistician. Screening  You may have the following tests or measurements:  Height, weight, and BMI.  Blood pressure.  Lipid and cholesterol levels. These may be checked every 5 years, or more frequently if you are over 64 years old.  Skin check.  Lung cancer screening. You may have this screening every year starting at age 64 if you have a 30-pack-year history of smoking and currently smoke or have quit within the past 15 years.  Fecal occult blood test (FOBT) of the stool. You may have this test every year starting at age 58.  Flexible sigmoidoscopy or colonoscopy. You may have a sigmoidoscopy every 5 years or a colonoscopy every 10 years starting at age 68.  Prostate cancer screening. Recommendations will vary depending on your family history and other risks.  Hepatitis C blood test.  Hepatitis B blood test.  Sexually transmitted disease (STD) testing.  Diabetes screening. This is done by checking your blood sugar (glucose) after you have not eaten for a while (fasting). You may have this done every 1-3 years.  Abdominal aortic aneurysm (AAA) screening. You may need this if you are a current or former smoker.  Osteoporosis. You may be screened starting at age 74 if you are at high risk. Talk with your health care provider about your test results, treatment options, and if necessary, the need for more tests. Vaccines  Your health care provider may recommend certain vaccines, such as:  Influenza vaccine. This is recommended every year.  Tetanus, diphtheria, and acellular pertussis (Tdap, Td)  vaccine. You may need a Td booster every 10 years.  Zoster vaccine. You may need this after age 57.  Pneumococcal 13-valent conjugate (PCV13) vaccine. One dose is  recommended after age 61.  Pneumococcal polysaccharide (PPSV23) vaccine. One dose is recommended after age 8. Talk to your health care provider about which screenings and vaccines you need and how often you need them. This information is not intended to replace advice given to you by your health care provider. Make sure you discuss any questions you have with your health care provider. Document Released: 03/10/2015 Document Revised: 11/01/2015 Document Reviewed: 12/13/2014 Elsevier Interactive Patient Education  2017 [place] Prevention in the Home Falls can cause injuries. They can happen to people of all ages. There are many things you can do to make your home safe and to help prevent falls. What can I do on the outside of my home?  Regularly fix the edges of walkways and driveways and fix any cracks.  Remove anything that might make you trip as you walk through a door, such as a raised step or threshold.  Trim any bushes or trees on the path to your home.  Use bright outdoor lighting.  Clear any walking paths of anything that might make someone trip, such as rocks or tools.  Regularly check to see if handrails are loose or broken. Make sure that both sides of any steps have handrails.  Any raised decks and porches should have guardrails on the edges.  Have any leaves, snow, or ice cleared regularly.  Use sand or salt on walking paths during winter.  Clean up any spills in your garage right away. This includes oil or grease spills. What can I do in the bathroom?  Use night lights.  Install grab bars by the toilet and in the tub and shower. Do not use towel bars as grab bars.  Use non-skid mats or decals in the tub or shower.  If you need to sit down in the shower, use a plastic, non-slip stool.  Keep the floor dry. Clean up any water that spills on the floor as soon as it happens.  Remove soap buildup in the tub or shower regularly.  Attach bath mats  securely with double-sided non-slip rug tape.  Do not have throw rugs and other things on the floor that can make you trip. What can I do in the bedroom?  Use night lights.  Make sure that you have a light by your bed that is easy to reach.  Do not use any sheets or blankets that are too big for your bed. They should not hang down onto the floor.  Have a firm chair that has side arms. You can use this for support while you get dressed.  Do not have throw rugs and other things on the floor that can make you trip. What can I do in the kitchen?  Clean up any spills right away.  Avoid walking on wet floors.  Keep items that you use a lot in easy-to-reach places.  If you need to reach something above you, use a strong step stool that has a grab bar.  Keep electrical cords out of the way.  Do not use floor polish or wax that makes floors slippery. If you must use wax, use non-skid floor wax.  Do not have throw rugs and other things on the floor that can make you trip. What can I do with my stairs?  Do not leave  any items on the stairs.  Make sure that there are handrails on both sides of the stairs and use them. Fix handrails that are broken or loose. Make sure that handrails are as long as the stairways.  Check any carpeting to make sure that it is firmly attached to the stairs. Fix any carpet that is loose or worn.  Avoid having throw rugs at the top or bottom of the stairs. If you do have throw rugs, attach them to the floor with carpet tape.  Make sure that you have a light switch at the top of the stairs and the bottom of the stairs. If you do not have them, ask someone to add them for you. What else can I do to help prevent falls?  Wear shoes that:  Do not have high heels.  Have rubber bottoms.  Are comfortable and fit you well.  Are closed at the toe. Do not wear sandals.  If you use a stepladder:  Make sure that it is fully opened. Do not climb a closed  stepladder.  Make sure that both sides of the stepladder are locked into place.  Ask someone to hold it for you, if possible.  Clearly mark and make sure that you can see:  Any grab bars or handrails.  First and last steps.  Where the edge of each step is.  Use tools that help you move around (mobility aids) if they are needed. These include:  Canes.  Walkers.  Scooters.  Crutches.  Turn on the lights when you go into a dark area. Replace any light bulbs as soon as they burn out.  Set up your furniture so you have a clear path. Avoid moving your furniture around.  If any of your floors are uneven, fix them.  If there are any pets around you, be aware of where they are.  Review your medicines with your doctor. Some medicines can make you feel dizzy. This can increase your chance of falling. Ask your doctor what other things that you can do to help prevent falls. This information is not intended to replace advice given to you by your health care provider. Make sure you discuss any questions you have with your health care provider. Document Released: 12/08/2008 Document Revised: 07/20/2015 Document Reviewed: 03/18/2014 Elsevier Interactive Patient Education  2017 [employer].

## 2019-10-14 DIAGNOSIS — M5136 Other intervertebral disc degeneration, lumbar region: Secondary | ICD-10-CM | POA: Diagnosis not present

## 2019-10-18 DIAGNOSIS — M25562 Pain in left knee: Secondary | ICD-10-CM | POA: Diagnosis not present

## 2019-10-18 DIAGNOSIS — Z96652 Presence of left artificial knee joint: Secondary | ICD-10-CM | POA: Diagnosis not present

## 2019-10-20 DIAGNOSIS — M545 Low back pain: Secondary | ICD-10-CM | POA: Diagnosis not present

## 2019-10-20 DIAGNOSIS — M5416 Radiculopathy, lumbar region: Secondary | ICD-10-CM | POA: Diagnosis not present

## 2019-10-26 ENCOUNTER — Other Ambulatory Visit: Payer: Self-pay | Admitting: Family Medicine

## 2019-10-26 DIAGNOSIS — M4692 Unspecified inflammatory spondylopathy, cervical region: Secondary | ICD-10-CM

## 2019-10-26 DIAGNOSIS — M159 Polyosteoarthritis, unspecified: Secondary | ICD-10-CM

## 2019-10-26 DIAGNOSIS — M150 Primary generalized (osteo)arthritis: Secondary | ICD-10-CM

## 2019-10-26 MED ORDER — HYDROCODONE-ACETAMINOPHEN 10-325 MG PO TABS: 1.0000 | ORAL_TABLET | Freq: Four times a day (QID) | ORAL | 0 refills | Status: DC | PRN

## 2019-10-26 NOTE — Telephone Encounter (Signed)
Last OV 07/07/19 Hydrocodone last filled 09/27/19 #120 with 0

## 2019-11-01 ENCOUNTER — Encounter: Payer: Self-pay | Admitting: Family Medicine

## 2019-11-03 ENCOUNTER — Other Ambulatory Visit: Payer: Self-pay | Admitting: Family Medicine

## 2019-11-03 DIAGNOSIS — E785 Hyperlipidemia, unspecified: Secondary | ICD-10-CM

## 2019-11-03 DIAGNOSIS — G4730 Sleep apnea, unspecified: Secondary | ICD-10-CM

## 2019-11-03 NOTE — Telephone Encounter (Signed)
Last OV 07/07/19 Zolpidem last filled 08/11/19

## 2019-11-05 DIAGNOSIS — M545 Low back pain: Secondary | ICD-10-CM | POA: Diagnosis not present

## 2019-11-07 ENCOUNTER — Other Ambulatory Visit: Payer: Self-pay | Admitting: Family Medicine

## 2019-11-07 DIAGNOSIS — G4700 Insomnia, unspecified: Secondary | ICD-10-CM

## 2019-11-08 ENCOUNTER — Encounter: Payer: Self-pay | Admitting: Family Medicine

## 2019-11-08 NOTE — Telephone Encounter (Signed)
Last OV 07/07/19 Zolpidem last filled 11/03/19 #30 with 2

## 2019-11-18 ENCOUNTER — Encounter: Payer: Self-pay | Admitting: Family Medicine

## 2019-11-18 DIAGNOSIS — M2550 Pain in unspecified joint: Secondary | ICD-10-CM

## 2019-11-18 DIAGNOSIS — M159 Polyosteoarthritis, unspecified: Secondary | ICD-10-CM

## 2019-11-18 DIAGNOSIS — M150 Primary generalized (osteo)arthritis: Secondary | ICD-10-CM

## 2019-11-18 DIAGNOSIS — M4692 Unspecified inflammatory spondylopathy, cervical region: Secondary | ICD-10-CM

## 2019-11-22 ENCOUNTER — Encounter: Payer: Self-pay | Admitting: Family Medicine

## 2019-11-22 DIAGNOSIS — M2550 Pain in unspecified joint: Secondary | ICD-10-CM

## 2019-11-22 MED ORDER — HYDROCODONE-ACETAMINOPHEN 10-325 MG PO TABS: 1.0000 | ORAL_TABLET | Freq: Four times a day (QID) | ORAL | 0 refills | Status: DC | PRN

## 2019-11-22 NOTE — Addendum Note (Signed)
Addended by: [name] on: 11/22/2019 10:34 AM   Modules accepted: Orders

## 2019-11-23 DIAGNOSIS — Z471 Aftercare following joint replacement surgery: Secondary | ICD-10-CM | POA: Diagnosis not present

## 2019-11-23 DIAGNOSIS — M25562 Pain in left knee: Secondary | ICD-10-CM | POA: Diagnosis not present

## 2019-11-23 DIAGNOSIS — M25551 Pain in right hip: Secondary | ICD-10-CM | POA: Diagnosis not present

## 2019-11-23 DIAGNOSIS — Z96652 Presence of left artificial knee joint: Secondary | ICD-10-CM | POA: Diagnosis not present

## 2019-11-24 DIAGNOSIS — I10 Essential (primary) hypertension: Secondary | ICD-10-CM | POA: Diagnosis not present

## 2019-11-24 DIAGNOSIS — M545 Low back pain, unspecified: Secondary | ICD-10-CM | POA: Insufficient documentation

## 2019-11-24 DIAGNOSIS — F419 Anxiety disorder, unspecified: Secondary | ICD-10-CM | POA: Diagnosis not present

## 2019-11-24 DIAGNOSIS — G8929 Other chronic pain: Secondary | ICD-10-CM | POA: Diagnosis not present

## 2019-11-24 DIAGNOSIS — N401 Enlarged prostate with lower urinary tract symptoms: Secondary | ICD-10-CM | POA: Diagnosis not present

## 2019-11-24 DIAGNOSIS — E785 Hyperlipidemia, unspecified: Secondary | ICD-10-CM | POA: Diagnosis not present

## 2019-11-24 DIAGNOSIS — J302 Other seasonal allergic rhinitis: Secondary | ICD-10-CM | POA: Diagnosis not present

## 2019-11-24 DIAGNOSIS — Z85820 Personal history of malignant melanoma of skin: Secondary | ICD-10-CM | POA: Diagnosis not present

## 2019-11-24 DIAGNOSIS — G4700 Insomnia, unspecified: Secondary | ICD-10-CM | POA: Diagnosis not present

## 2019-11-24 DIAGNOSIS — R739 Hyperglycemia, unspecified: Secondary | ICD-10-CM | POA: Insufficient documentation

## 2019-11-24 DIAGNOSIS — K219 Gastro-esophageal reflux disease without esophagitis: Secondary | ICD-10-CM | POA: Insufficient documentation

## 2019-11-24 DIAGNOSIS — I2510 Atherosclerotic heart disease of native coronary artery without angina pectoris: Secondary | ICD-10-CM | POA: Insufficient documentation

## 2019-11-26 DIAGNOSIS — M25551 Pain in right hip: Secondary | ICD-10-CM | POA: Diagnosis not present

## 2019-11-26 DIAGNOSIS — M5136 Other intervertebral disc degeneration, lumbar region: Secondary | ICD-10-CM | POA: Diagnosis not present

## 2019-11-26 DIAGNOSIS — M961 Postlaminectomy syndrome, not elsewhere classified: Secondary | ICD-10-CM | POA: Diagnosis not present

## 2019-11-26 DIAGNOSIS — F32A Depression, unspecified: Secondary | ICD-10-CM | POA: Diagnosis not present

## 2019-11-26 DIAGNOSIS — M25562 Pain in left knee: Secondary | ICD-10-CM | POA: Diagnosis not present

## 2019-11-26 DIAGNOSIS — M47816 Spondylosis without myelopathy or radiculopathy, lumbar region: Secondary | ICD-10-CM | POA: Diagnosis not present

## 2019-11-30 DIAGNOSIS — E785 Hyperlipidemia, unspecified: Secondary | ICD-10-CM | POA: Diagnosis not present

## 2019-11-30 DIAGNOSIS — R739 Hyperglycemia, unspecified: Secondary | ICD-10-CM | POA: Diagnosis not present

## 2019-11-30 DIAGNOSIS — M25562 Pain in left knee: Secondary | ICD-10-CM | POA: Diagnosis not present

## 2019-12-01 ENCOUNTER — Other Ambulatory Visit: Payer: Self-pay | Admitting: Student

## 2019-12-01 ENCOUNTER — Other Ambulatory Visit (HOSPITAL_COMMUNITY): Payer: Self-pay | Admitting: Student

## 2019-12-01 DIAGNOSIS — N401 Enlarged prostate with lower urinary tract symptoms: Secondary | ICD-10-CM | POA: Diagnosis not present

## 2019-12-01 DIAGNOSIS — R350 Frequency of micturition: Secondary | ICD-10-CM | POA: Diagnosis not present

## 2019-12-01 DIAGNOSIS — R3914 Feeling of incomplete bladder emptying: Secondary | ICD-10-CM | POA: Diagnosis not present

## 2019-12-01 DIAGNOSIS — Z96652 Presence of left artificial knee joint: Secondary | ICD-10-CM

## 2019-12-08 ENCOUNTER — Ambulatory Visit (HOSPITAL_COMMUNITY)
Admission: RE | Admit: 2019-12-08 | Discharge: 2019-12-08 | Disposition: A | Discharge status: Home / Self Care | Payer: Medicare HMO | Source: Ambulatory Visit | Attending: Student | Admitting: Student

## 2019-12-08 ENCOUNTER — Other Ambulatory Visit: Payer: Self-pay

## 2019-12-08 ENCOUNTER — Encounter (HOSPITAL_COMMUNITY)
Admission: RE | Admit: 2019-12-08 | Discharge: 2019-12-08 | Disposition: A | Discharge status: Home / Self Care | Payer: Medicare HMO | Source: Ambulatory Visit | Attending: Student | Admitting: Student

## 2019-12-08 DIAGNOSIS — M25562 Pain in left knee: Secondary | ICD-10-CM | POA: Diagnosis not present

## 2019-12-08 DIAGNOSIS — Z96652 Presence of left artificial knee joint: Secondary | ICD-10-CM | POA: Diagnosis not present

## 2019-12-08 MED ORDER — TECHNETIUM TC 99M MEDRONATE IV KIT
20.0000 | PACK | Freq: Once | INTRAVENOUS | Status: AC | PRN
  Administered 2019-12-08: 20 via INTRAVENOUS

## 2019-12-16 DIAGNOSIS — T8484XA Pain due to internal orthopedic prosthetic devices, implants and grafts, initial encounter: Secondary | ICD-10-CM | POA: Diagnosis not present

## 2019-12-16 DIAGNOSIS — Z96652 Presence of left artificial knee joint: Secondary | ICD-10-CM | POA: Diagnosis not present

## 2019-12-16 DIAGNOSIS — M25562 Pain in left knee: Secondary | ICD-10-CM | POA: Diagnosis not present

## 2019-12-17 ENCOUNTER — Encounter: Payer: Self-pay | Admitting: Family Medicine

## 2019-12-17 ENCOUNTER — Other Ambulatory Visit: Payer: Self-pay | Admitting: Family Medicine

## 2019-12-17 NOTE — Telephone Encounter (Signed)
LFD 07/16/19 #20 with no refills LOV 07/07/19 NOV none

## 2019-12-22 DIAGNOSIS — Z0100 Encounter for examination of eyes and vision without abnormal findings: Secondary | ICD-10-CM | POA: Diagnosis not present

## 2019-12-31 DIAGNOSIS — R3912 Poor urinary stream: Secondary | ICD-10-CM | POA: Diagnosis not present

## 2019-12-31 DIAGNOSIS — R3915 Urgency of urination: Secondary | ICD-10-CM | POA: Diagnosis not present

## 2019-12-31 DIAGNOSIS — N401 Enlarged prostate with lower urinary tract symptoms: Secondary | ICD-10-CM | POA: Diagnosis not present

## 2019-12-31 DIAGNOSIS — R350 Frequency of micturition: Secondary | ICD-10-CM | POA: Diagnosis not present

## 2020-01-03 ENCOUNTER — Other Ambulatory Visit: Payer: Self-pay | Admitting: Urology

## 2020-01-12 ENCOUNTER — Telehealth: Payer: Self-pay | Admitting: Internal Medicine

## 2020-01-12 DIAGNOSIS — E782 Mixed hyperlipidemia: Secondary | ICD-10-CM

## 2020-01-12 DIAGNOSIS — I2510 Atherosclerotic heart disease of native coronary artery without angina pectoris: Secondary | ICD-10-CM

## 2020-01-12 DIAGNOSIS — I10 Essential (primary) hypertension: Secondary | ICD-10-CM

## 2020-01-12 NOTE — Telephone Encounter (Signed)
Called patient - see MyChart message for details.

## 2020-01-12 NOTE — Telephone Encounter (Signed)
Pt c/o BP issue: STAT if pt c/o blurred vision, one-sided weakness or slurred speech  1. What are your last 5 BP readings?  170/87, 161/89  2. Are you having any other symptoms (ex. Dizziness, headache, blurred vision, passed out)?  dizziness  3. What is your BP issue? Blood pressure running high for him- wanted an appt- I made one for Friday(01-14-20)

## 2020-01-12 NOTE — Telephone Encounter (Signed)
Called patient.  He is going to come Friday morning for fasting labs.  Appreciative for call.

## 2020-01-14 ENCOUNTER — Encounter: Payer: Self-pay | Admitting: Internal Medicine

## 2020-01-14 ENCOUNTER — Ambulatory Visit: Payer: Medicare HMO | Admitting: Internal Medicine

## 2020-01-14 ENCOUNTER — Other Ambulatory Visit: Payer: Self-pay

## 2020-01-14 ENCOUNTER — Other Ambulatory Visit: Payer: Medicare HMO | Admitting: *Deleted

## 2020-01-14 VITALS — BP 152/88 | HR 97 | Ht 72.0 in | Wt 233.8 lb

## 2020-01-14 DIAGNOSIS — I2510 Atherosclerotic heart disease of native coronary artery without angina pectoris: Secondary | ICD-10-CM

## 2020-01-14 DIAGNOSIS — I10 Essential (primary) hypertension: Secondary | ICD-10-CM

## 2020-01-14 DIAGNOSIS — E782 Mixed hyperlipidemia: Secondary | ICD-10-CM

## 2020-01-14 LAB — CBC
Hematocrit: 40.5 % (ref 37.5–51.0)
Hemoglobin: 13.7 g/dL (ref 13.0–17.7)
MCH: 30.4 pg (ref 26.6–33.0)
MCHC: 33.8 g/dL (ref 31.5–35.7)
MCV: 90 fL (ref 79–97)
Platelets: 289 10*3/uL (ref 150–450)
RBC: 4.51 x10E6/uL (ref 4.14–5.80)
RDW: 13 % (ref 11.6–15.4)
WBC: 8.2 10*3/uL (ref 3.4–10.8)

## 2020-01-14 LAB — BASIC METABOLIC PANEL
BUN/Creatinine Ratio: 16 (ref 10–24)
BUN: 14 mg/dL (ref 8–27)
CO2: 22 mmol/L (ref 20–29)
Calcium: 9.5 mg/dL (ref 8.6–10.2)
Chloride: 102 mmol/L (ref 96–106)
Creatinine, Ser: 0.87 mg/dL (ref 0.76–1.27)
GFR calc Af Amer: 103 mL/min/{1.73_m2} (ref >59)
GFR calc non Af Amer: 89 mL/min/{1.73_m2} (ref >59)
Glucose: 105 mg/dL — ABNORMAL HIGH (ref 65–99)
Potassium: 4.6 mmol/L (ref 3.5–5.2)
Sodium: 137 mmol/L (ref 134–144)

## 2020-01-14 LAB — LIPID PANEL
Chol/HDL Ratio: 2.7 ratio (ref 0.0–5.0)
Cholesterol, Total: 164 mg/dL (ref 100–199)
HDL: 61 mg/dL (ref >39)
LDL Chol Calc (NIH): 76 mg/dL (ref 0–99)
Triglycerides: 157 mg/dL — ABNORMAL HIGH (ref 0–149)
VLDL Cholesterol Cal: 27 mg/dL (ref 5–40)

## 2020-01-14 LAB — HEPATIC FUNCTION PANEL
ALT: 20 IU/L (ref 0–44)
AST: 23 IU/L (ref 0–40)
Albumin: 4.5 g/dL (ref 3.8–4.8)
Alkaline Phosphatase: 67 IU/L (ref 44–121)
Bilirubin Total: 0.3 mg/dL (ref 0.0–1.2)
Bilirubin, Direct: 0.15 mg/dL (ref 0.00–0.40)
Total Protein: 7 g/dL (ref 6.0–8.5)

## 2020-01-14 MED ORDER — HYDROCHLOROTHIAZIDE 12.5 MG PO CAPS: 12.5000 mg | ORAL_CAPSULE | Freq: Every day | ORAL | 3 refills | Status: DC

## 2020-01-14 NOTE — Progress Notes (Signed)
Cardiology Office Note    Date:  01/14/2020   ID:  TATUM Dylan [name], DOB 1950-05-15, MRN [id number]  PCP:  Percell Belt, DO  Cardiologist:  Dr. Harrington Challenger  F/U of CAD, HTN  History of Present Illness:   Dylan [name] is a 69 y.o. male with past medical history of hypertension, hyperlipidemia, diabetes (diet-controlled), degenerative joint disease, GERD, sleep apnea on CPAP and mild CAD   Pt was admitted to [facility] in 2019 with chest pressure  He had been golfing   He wonders if he was dehydrated  Myovue was abnormal  He went on to have cath that showed minimal CAD   He was seen by B Bhagat after     I saw him in Jan 2021 Since I saw him last his wife died in 07-02-2022   It has been difficult year Over past month or so his BP has been high  His PCP increased Avapro to 150 mg He denies CP   Does say at night he will have some SOB in bed   Past Medical History:  Diagnosis Date  . Allergy    allergic rhinitis  . Anxiety    xanax as needed   . Arthritis    R knee, hands , back   . Cancer ([place])    melonoma- on face, treated with excision   . Complication of anesthesia    trouble waking up with succycholine, does not have enzyme to break down succycholine  . Dizziness    MRI brain unremarkable 02/05/2015.  Marland Kitchen GERD (gastroesophageal reflux disease)   . Headache(784.0)   . Hepatitis age 69   history of infections Hepatitis type a  . History of melanoma 15 yrs ago   face and forehead-- previously followed by Derm in [place]  . History of ulcerative colitis    non recent flares  . Hyperlipidemia   . Hypertension   . Sleep apnea    last sleep study 10 yrs ago,CPAP- intermittent use, not every night      Past Surgical History:  Procedure Laterality Date  . acl and mensicus repair reconstruct Right 1980's  . ANTERIOR CERVICAL DECOMP/DISCECTOMY FUSION  10/15/2011   Procedure: ANTERIOR CERVICAL DECOMPRESSION/DISCECTOMY FUSION 1 LEVEL;  Surgeon: [name], MD;  Location: [facility];  Service: Neurosurgery;  Laterality: N/A;  Cervical four-five Anterior cervical decompression/diskectomy/fusion  . APPENDECTOMY  1969  . COLONOSCOPY    . HERNIA REPAIR     inguinal / abdominal - repaired as an infant   . JOINT REPLACEMENT    . KNEE ARTHROSCOPY Left last done oct 2013   x 3  . KNEE SURGERY  yrs ago   right knee  . LEFT HEART CATH AND CORONARY ANGIOGRAPHY N/A 11/14/2017   Procedure: LEFT HEART CATH AND CORONARY ANGIOGRAPHY;  Surgeon: [name], MD;  Location: [facility];  Service: Cardiovascular;  Laterality: N/A;  . LUMBAR LAMINECTOMY WITH COFLEX 2 LEVEL N/A 09/12/2015   Procedure: L3-4 L4-5 Laminectomy with coflex;  Surgeon: [name], MD;  Location: [place] [facility];  Service: Neurosurgery;  Laterality: N/A;  L3-4 L4-5 Laminectomy with coflex  . NASAL SINUS SURGERY     multiple sinus surgery - 1980's   . TONSILLECTOMY  1969  . TOTAL KNEE ARTHROPLASTY Left 10/22/2012   Procedure: TOTAL KNEE ARTHROPLASTY;  Surgeon: [name], MD;  Location: [facility];  Service: Orthopedics;  Laterality: Left;    Current Medications: Prior to Admission  medications   Medication Sig Start Date End Date Taking? Authorizing Provider  nortriptyline (PAMELOR) 10 MG capsule Take 10 mg by mouth at bedtime.   Yes [provider]  amLODipine (NORVASC) 5 MG tablet Take 1 tablet (5 mg total) by mouth daily. 07/17/17   [name], MD  aspirin EC 81 MG tablet Take 1 tablet (81 mg total) by mouth daily. 10/24/12   [name], [name]  co-enzyme Q-10 30 MG capsule Take 30 mg by mouth daily.    [provider]  dutasteride (AVODART) 0.5 MG capsule TAKE 1 CAPSULE (0.5 MG TOTAL) BY MOUTH DAILY. 06/18/17   [name], MD  esomeprazole (NEXIUM) 40 MG capsule TAKE ONE CAPSULE BY MOUTH EVERY DAY BEFORE BREAKFAST 05/19/17   [name], MD  HYDROcodone-acetaminophen (NORCO) 10-325 MG tablet Take 1 tablet by mouth every 6 (six) hours as needed for severe pain.  12/04/17   [name], MD  lidocaine (LIDODERM) 5 % Place 1 patch onto the skin daily. May wear up to 12 hours 11/02/17   [provider]  Melatonin 5 MG TABS Take 1 tablet by mouth at bedtime.     [provider]  mupirocin ointment (BACTROBAN) 2 % Apply 1 application topically 2 (two) times daily. 10/10/17   [name], MD  naloxone [facility]) nasal spray 4 mg/0.1 mL Use as directed 07/22/17   [name], MD  nitroGLYCERIN (NITROSTAT) 0.4 MG SL tablet Place 1 tablet (0.4 mg total) under the tongue every 5 (five) minutes x 3 doses as needed for chest pain. 11/14/17   Bhagat, [name], PA  rosuvastatin (CRESTOR) 10 MG tablet TAKE 1 TABLET BY MOUTH EVERY DAY 05/11/17   [name], MD  tamsulosin (FLOMAX) 0.4 MG CAPS capsule Take 0.4 mg by mouth.    [provider]  valsartan (DIOVAN) 80 MG tablet TAKE 1 TABLET BY MOUTH EVERY DAY 12/02/17   [name], MD  zolpidem (AMBIEN) 10 MG tablet TAKE 1 TABLET BY MOUTH EVERY DAY AT BEDTIME AS NEEDED 12/11/17   [name], MD    Allergies:   Heparin, Succinylcholine chloride, Vancomycin, and Penicillins   Social History   Socioeconomic History  . Marital status: Widowed    Spouse name: Not on file  . Number of children: 3  . Years of education: Not on file  . Highest education level: Not on file  Occupational History  . Occupation: retired  Tobacco Use  . Smoking status: Never Smoker  . Smokeless tobacco: Former Systems developer    Types: Secondary school teacher  . Vaping Use: Never used  Substance and Sexual Activity  . Alcohol use: No  . Drug use: No  . Sexual activity: Not on file  Other Topics Concern  . Not on file  Social History Narrative   Wife works at cath lab at [employer] advanced breast cancer   Lives with wife in a 2 story home.  Retired.  Education: college.    Social Determinants of Health   Financial Resource Strain: Low Risk   . Difficulty of Paying Living Expenses: Not  hard at all  Food Insecurity: No Food Insecurity  . Worried About Charity fundraiser in the Last Year: Never true  . Ran Out of Food in the Last Year: Never true  Transportation Needs: No Transportation Needs  . Lack of Transportation (Medical): No  . Lack of Transportation (Non-Medical): No  Physical Activity: Sufficiently Active  .  Days of Exercise per Week: 7 days  . Minutes of Exercise per Session: 40 min  Stress: No Stress Concern Present  . Feeling of Stress : Not at all  Social Connections: Moderately Integrated  . Frequency of Communication with Friends and Family: More than three times a week  . Frequency of Social Gatherings with Friends and Family: More than three times a week  . Attends Religious Services: More than 4 times per year  . Active Member of Clubs or Organizations: Yes  . Attends Archivist Meetings: More than 4 times per year  . Marital Status: Widowed     Family History:  The patient's family history includes Cancer in his brother; Diabetes in his father; Ovarian cancer in his mother.  ROS:   Please see the history of present illness.    ROS All other systems reviewed and are negative.   PHYSICAL EXAM:   VS:  BP (!) 152/88   Pulse 97   Ht 6' (1.829 m)   Wt 233 lb 12.8 oz (106.1 kg)   SpO2 98%   BMI 31.71 kg/m    GEN: Obese  69 yo  in no acute distress  HEENT: normal  Neck: no JVD Cardiac: RRR; no murmurs, No LE  edema  Respiratory:  clear to auscultation bilaterally, normal work of breathing GI: soft, nontender, nondistended, + BS  Skin: warm and dry, no rash Neuro:  Alert and Oriented x 3, Strength and sensation are intact Psych: euthymic mood, full affect  Wt Readings from Last 3 Encounters:  01/14/20 233 lb 12.8 oz (106.1 kg)  10/04/19 224 lb (101.6 kg)  07/07/19 220 lb 6 oz (100 kg)      Studies/Labs Reviewed:   EKG:  EKG is not done  Recent Labs: 04/14/2019: ALT 21; BUN 15; Creatinine, Ser 0.77; Hemoglobin 12.8;  Platelets 266; Potassium 3.8; Sodium 131   Lipid Panel    Component Value Date/Time   CHOL 179 10/19/2018 0845   TRIG 79.0 10/19/2018 0845   HDL 66.40 10/19/2018 0845   CHOLHDL 3 10/19/2018 0845   VLDL 15.8 10/19/2018 0845   LDLCALC 97 10/19/2018 0845   LDLCALC 83 04/10/2018 1529   LDLDIRECT 142.0 11/11/2016 1626    Additional studies/ records that were reviewed today include:   Stress test 11/12/17 IMPRESSION: 1. Large area of reversibility involving the mid and distal anterior, septal, and inferoapical walls, suspicious for inducible myocardial ischemia.  2. Mild left ventricular dilatation, without regional wall motion abnormality.  3. Left ventricular ejection fraction 59%  4. Non invasive risk stratification*: High  LEFT HEART CATH AND CORONARY ANGIOGRAPHY  10/2017  Conclusion     Prox LAD lesion is 10% stenosed. Nonobstructive CAD.  The left ventricular systolic function is normal.  LV end diastolic pressure is mildly elevated. LVEDP 18 mm Hg.  The left ventricular ejection fraction is 55-65% by visual estimate.  There is no aortic valve stenosis.   Continue aggressive preventive therapy.       ASSESSMENT & PLAN:   1.  CAD  -False positive stress test. In 2019  Cath with 10%pLAD. Continue ASA and statin. Pt denies CP      2. HTN  BP is higher   Will check lab results  If Cr OK Rx with HCTZ12.5 mg   F/U in HTN clinic   3. OSA on CPAP - compliant  4. HLD On statin   LIpids are pending   5  DJD  Pt followed by Dr Hosie Poisson him name of Bjorn Loser for PT    F/U next September with me    Medication Adjustments/Labs and Tests Ordered: Current medicines are reviewed at length with the patient today.  Concerns regarding medicines are outlined above.  Medication changes, Labs and Tests ordered today are listed in the Patient Instructions below. There are no Patient Instructions on file for this visit.   Signed, Dorris Carnes, MD   01/14/2020 4:14 PM    [facility] [place], [place], [place]  [address] Phone: [phone]; Fax: [phone]

## 2020-01-14 NOTE — Patient Instructions (Signed)
Medication Instructions:  Your physician has recommended you make the following change in your medication:  1.) start hydrochlorothiazide (hctz) 12.5 mg --one tablet daily3  *If you need a refill on your cardiac medications before your next appointment, please call your pharmacy*   Lab Work: none If you have labs (blood work) drawn today and your tests are completely normal, you will receive your results only by: Marland Kitchen MyChart Message (if you have MyChart) OR . A paper copy in the mail If you have any lab test that is abnormal or we need to change your treatment, we will call you to review the results.   Testing/Procedures: none   Follow-Up: In about 6 weeks in Hypertension Clinic  Other Instructions

## 2020-01-24 ENCOUNTER — Encounter (HOSPITAL_BASED_OUTPATIENT_CLINIC_OR_DEPARTMENT_OTHER): Payer: Self-pay | Admitting: Urology

## 2020-01-26 DIAGNOSIS — M47816 Spondylosis without myelopathy or radiculopathy, lumbar region: Secondary | ICD-10-CM | POA: Diagnosis not present

## 2020-01-26 DIAGNOSIS — G8929 Other chronic pain: Secondary | ICD-10-CM | POA: Diagnosis not present

## 2020-01-26 DIAGNOSIS — M5441 Lumbago with sciatica, right side: Secondary | ICD-10-CM | POA: Insufficient documentation

## 2020-01-26 DIAGNOSIS — M5416 Radiculopathy, lumbar region: Secondary | ICD-10-CM | POA: Diagnosis not present

## 2020-01-26 DIAGNOSIS — M5442 Lumbago with sciatica, left side: Secondary | ICD-10-CM | POA: Diagnosis not present

## 2020-01-27 ENCOUNTER — Other Ambulatory Visit: Payer: Self-pay

## 2020-01-27 ENCOUNTER — Encounter (HOSPITAL_BASED_OUTPATIENT_CLINIC_OR_DEPARTMENT_OTHER): Payer: Self-pay | Admitting: Urology

## 2020-01-27 NOTE — Progress Notes (Signed)
Spoke w/ via phone for pre-op interview---pt Lab needs dos----  I stat 8             COVID test ------01-28-2020 1450 Arrive at -------530 am 02-03-2020 NPO after MN NO Solid Food.  Clear liquids from MN until---430 am then npo Medications to take morning of surgery -----rosuvastatin, amlodipine, flonase prn, dutasteride, tamsulosin, nexium, oxycodone Diabetic medication -----n/a Patient Special Instructions -----patient given overnight stay instructions Pre-Op special Istructions -----none Patient verbalized understanding of instructions that were given at this phone interview. Patient denies shortness of breath, chest pain, fever, cough at this phone interview.  Anesthesia Review: no   PCP: dr Rayann Heman Cardiologist :dr Dorris Carnes lov 01-14-2020 Chest x-ray :04-14-2019 epic EKG :04-14-2019 epic Echo : none Stress test: false positve stress test 11-12-2017 epic, per dr Rudean Haskell 01-14-2020 epic Cardiac Cath : 11-14-2017 epic Activity level:  Sleep Study/ CPAP :pt not currently using cpap feels he does not need it now  Fasting Blood Sugar :      / Checks Blood Sugar -- times a day:  n/a Blood Thinner/ Instructions /Last Dose:n/a ASA / Instructions/ Last Dose : last dose 81 mg aspirin 01-28-2020 per dr Diona Fanti instructions

## 2020-01-28 ENCOUNTER — Other Ambulatory Visit (HOSPITAL_COMMUNITY)
Admission: RE | Admit: 2020-01-28 | Discharge: 2020-01-28 | Disposition: A | Discharge status: Home / Self Care | Payer: Medicare HMO | Source: Ambulatory Visit | Attending: Urology | Admitting: Urology

## 2020-01-28 DIAGNOSIS — Z20822 Contact with and (suspected) exposure to covid-19: Secondary | ICD-10-CM | POA: Diagnosis not present

## 2020-01-28 DIAGNOSIS — Z01812 Encounter for preprocedural laboratory examination: Secondary | ICD-10-CM | POA: Diagnosis not present

## 2020-01-29 LAB — SARS CORONAVIRUS 2 (TAT 6-24 HRS): SARS Coronavirus 2: NEGATIVE

## 2020-01-30 NOTE — H&P (Signed)
H&P  Chief Complaint: Prostate problems  History of Present Illness: 69 yo male w/ BPH/LUTS presents for TURP for mgmt of worsening symptomatology and increasing pvr volumes.  Past Medical History:  Diagnosis Date  . Allergy    allergic rhinitis  . Anxiety   . Arthritis    R knee, hands , back oa  . BPH (benign prostatic hyperplasia)   . Cancer ([place])    melonoma- on face, treated with excision   . Complication of anesthesia    trouble waking up with succycholine, does not have enzyme to break down succycholine  . Coronary artery disease    nonobstructive  . Dizziness    MRI brain unremarkable 02/05/2015. resolved as of 01-27-2020  . GERD (gastroesophageal reflux disease)   . Hepatitis age 63   history of infections Hepatitis type a  . History of melanoma 15 yrs ago   face and forehead-- previously followed by Derm in [place]  . History of migraine    none recent as of 01-27-2020  . History of ulcerative colitis    non recent flares  . Hyperlipidemia   . Hypertension   . Sleep apnea    last sleep study 10 yrs ago,CPAP-  no current cpap use     Past Surgical History:  Procedure Laterality Date  . acl and mensicus repair reconstruct Right 1980's  . ANTERIOR CERVICAL DECOMP/DISCECTOMY FUSION  10/15/2011   Procedure: ANTERIOR CERVICAL DECOMPRESSION/DISCECTOMY FUSION 1 LEVEL;  Surgeon: [name], MD;  Location: [place] [facility];  Service: Neurosurgery;  Laterality: N/A;  Cervical four-five Anterior cervical decompression/diskectomy/fusion  . APPENDECTOMY  1969  . COLONOSCOPY  last done 2018  . ELBOW SURGERY Right 2019   tendon replacement  . HERNIA REPAIR     inguinal / abdominal - repaired as an infant   . JOINT REPLACEMENT    . KNEE ARTHROSCOPY Left last done oct 2013   x 3  . KNEE SURGERY  yrs ago   right knee  . LEFT HEART CATH AND CORONARY ANGIOGRAPHY N/A 11/14/2017   Procedure: LEFT HEART CATH AND CORONARY ANGIOGRAPHY;  Surgeon: [name], MD;  Location: [place];  Service: Cardiovascular;  Laterality: N/A;  . LUMBAR LAMINECTOMY WITH COFLEX 2 LEVEL N/A 09/12/2015   Procedure: L3-4 L4-5 Laminectomy with coflex;  Surgeon: [name], MD;  Location: [place] [facility];  Service: Neurosurgery;  Laterality: N/A;  L3-4 L4-5 Laminectomy with coflex  . NASAL SINUS SURGERY     multiple sinus surgery - 1980's   . TONSILLECTOMY  1969  . TOTAL KNEE ARTHROPLASTY Left 10/22/2012   Procedure: TOTAL KNEE ARTHROPLASTY;  Surgeon: [name], MD;  Location: [facility];  Service: Orthopedics;  Laterality: Left;    Home Medications:  Allergies as of 01/30/2020      Reactions   Heparin Anaphylaxis   REACTION: breathing problems   Succinylcholine Chloride Other (See Comments)   REACTION: does not wake up from anesthetic, pt. Reports that through testing that he was found  not to have the enzyme to breakdown the Succ.   Vancomycin Hives, Other (See Comments)   Ran off a pump. So developed redman's . Running too fast   Penicillins Rash   Did it involve swelling of the face/tongue/throat, SOB, or low BP? No Did it involve sudden or severe rash/hives, skin peeling, or any reaction on the inside of your mouth or nose? No Did you need to seek medical attention at a hospital or doctor's  office? No When did it last happen?childhood If all above answers are "NO", may proceed with cephalosporin use.      Medication List    Notice   Cannot display discharge medications because the patient has not yet been admitted.     Allergies:  Allergies  Allergen Reactions  . Heparin Anaphylaxis    REACTION: breathing problems  . Succinylcholine Chloride Other (See Comments)    REACTION: does not wake up from anesthetic, pt. Reports that through testing that he was found  not to have the enzyme to breakdown the Succ.  . Vancomycin Hives and Other (See Comments)    Ran off a pump. So developed redman's . Running too fast  . Penicillins Rash    Did it involve  swelling of the face/tongue/throat, SOB, or low BP? No Did it involve sudden or severe rash/hives, skin peeling, or any reaction on the inside of your mouth or nose? No Did you need to seek medical attention at a hospital or doctor's office? No When did it last happen?childhood If all above answers are "NO", may proceed with cephalosporin use.    Family History  Problem Relation Age of Onset  . Cancer Brother        brain tumor  . Ovarian cancer Mother   . Diabetes Father   . Colon cancer Neg Hx   . Stomach cancer Neg Hx   . Rectal cancer Neg Hx   . Esophageal cancer Neg Hx   . Liver cancer Neg Hx     Social History:  reports that he has never smoked. He quit smokeless tobacco use about 16 years ago.  His smokeless tobacco use included chew. He reports that he does not drink alcohol and does not use drugs.  ROS: A complete review of systems was performed.  All systems are negative except for pertinent findings as noted.  Physical Exam:  Vital signs in last 24 hours: Ht 6' (1.829 m)   Wt 107.5 kg   BMI 32.14 kg/m  Constitutional:  Alert and oriented, No acute distress Cardiovascular: Regular rate  Respiratory: Normal respiratory effort GI: Abdomen is soft, nontender, nondistended, no abdominal masses. No CVAT.  Genitourinary: Normal male phallus, testes are descended bilaterally and non-tender and without masses, scrotum is normal in appearance without lesions or masses, perineum is normal on inspection. Lymphatic: No lymphadenopathy Neurologic: Grossly intact, no focal deficits Psychiatric: Normal mood and affect  Laboratory Data:  No results for input(s): WBC, HGB, HCT, PLT in the last 72 hours.  No results for input(s): NA, K, CL, GLUCOSE, BUN, CALCIUM, CREATININE in the last 72 hours.  Invalid input(s): CO3   No results found for this or any previous visit (from the past 24 hour(s)). Recent Results (from the past 240 hour(s))  SARS CORONAVIRUS 2 (TAT 6-24  HRS) Nasopharyngeal Nasopharyngeal Swab     Status: None   Collection Time: 01/28/20  2:54 PM   Specimen: Nasopharyngeal Swab  Result Value Ref Range Status   SARS Coronavirus 2 NEGATIVE NEGATIVE Final    Comment: (NOTE) SARS-CoV-2 target nucleic acids are NOT DETECTED.  The SARS-CoV-2 RNA is generally detectable in upper and lower respiratory specimens during the acute phase of infection. Negative results do not preclude SARS-CoV-2 infection, do not rule out co-infections with other pathogens, and should not be used as the sole basis for treatment or other patient management decisions. Negative results must be combined with clinical observations, patient history, and epidemiological information. The expected result  is Negative.  Fact Sheet for Patients: SugarRoll.be  Fact Sheet for Healthcare Providers: https://www.woods-mathews.com/  This test is not yet approved or cleared by the [place] FDA and  has been authorized for detection and/or diagnosis of SARS-CoV-2 by FDA under an Emergency Use Authorization (EUA). This EUA will remain  in effect (meaning this test can be used) for the duration of the COVID-19 declaration under Se ction 564(b)(1) of the Act, 21 U.S.C. section 360bbb-3(b)(1), unless the authorization is terminated or revoked sooner.  Performed at [facility], [place] [address] [place], [place] [address]     Renal Function: No results for input(s): CREATININE in the last 168 hours. Estimated Creatinine Clearance: 103 mL/min (by C-G formula based on SCr of 0.87 mg/dL).  Radiologic Imaging: No results found.  Impression/Assessment:  BPH w/ obstructive sx's.  Plan:  TURP

## 2020-01-31 ENCOUNTER — Ambulatory Visit (HOSPITAL_BASED_OUTPATIENT_CLINIC_OR_DEPARTMENT_OTHER): Payer: Medicare HMO | Admitting: Anesthesiology

## 2020-01-31 ENCOUNTER — Encounter (HOSPITAL_BASED_OUTPATIENT_CLINIC_OR_DEPARTMENT_OTHER): Payer: Self-pay | Admitting: Urology

## 2020-01-31 ENCOUNTER — Encounter (HOSPITAL_BASED_OUTPATIENT_CLINIC_OR_DEPARTMENT_OTHER)
Admission: RE | Disposition: A | Discharge status: Home / Self Care | Payer: Self-pay | Source: Home / Self Care | Attending: Urology

## 2020-01-31 ENCOUNTER — Observation Stay (HOSPITAL_BASED_OUTPATIENT_CLINIC_OR_DEPARTMENT_OTHER)
Admission: RE | Admit: 2020-01-31 | Discharge: 2020-02-01 | Disposition: A | Discharge status: Home / Self Care | Payer: Medicare HMO | Attending: Urology | Admitting: Urology

## 2020-01-31 DIAGNOSIS — N320 Bladder-neck obstruction: Secondary | ICD-10-CM | POA: Diagnosis not present

## 2020-01-31 DIAGNOSIS — Z96652 Presence of left artificial knee joint: Secondary | ICD-10-CM | POA: Diagnosis not present

## 2020-01-31 DIAGNOSIS — Z79899 Other long term (current) drug therapy: Secondary | ICD-10-CM | POA: Diagnosis not present

## 2020-01-31 DIAGNOSIS — N401 Enlarged prostate with lower urinary tract symptoms: Principal | ICD-10-CM | POA: Insufficient documentation

## 2020-01-31 DIAGNOSIS — I10 Essential (primary) hypertension: Secondary | ICD-10-CM | POA: Diagnosis not present

## 2020-01-31 DIAGNOSIS — N138 Other obstructive and reflux uropathy: Secondary | ICD-10-CM | POA: Diagnosis present

## 2020-01-31 DIAGNOSIS — I2510 Atherosclerotic heart disease of native coronary artery without angina pectoris: Secondary | ICD-10-CM | POA: Insufficient documentation

## 2020-01-31 DIAGNOSIS — N400 Enlarged prostate without lower urinary tract symptoms: Secondary | ICD-10-CM | POA: Diagnosis not present

## 2020-01-31 HISTORY — DX: Benign prostatic hyperplasia without lower urinary tract symptoms: N40.0

## 2020-01-31 HISTORY — DX: Atherosclerotic heart disease of native coronary artery without angina pectoris: I25.10

## 2020-01-31 HISTORY — PX: TRANSURETHRAL RESECTION OF PROSTATE: SHX73

## 2020-01-31 HISTORY — DX: Personal history of other diseases of the nervous system and sense organs: Z86.69

## 2020-01-31 LAB — POCT I-STAT, CHEM 8
BUN: 11 mg/dL (ref 8–23)
Calcium, Ion: 1.26 mmol/L (ref 1.15–1.40)
Chloride: 98 mmol/L (ref 98–111)
Creatinine, Ser: 0.6 mg/dL — ABNORMAL LOW (ref 0.61–1.24)
Glucose, Bld: 127 mg/dL — ABNORMAL HIGH (ref 70–99)
HCT: 42 % (ref 39.0–52.0)
Hemoglobin: 14.3 g/dL (ref 13.0–17.0)
Potassium: 3.7 mmol/L (ref 3.5–5.1)
Sodium: 142 mmol/L (ref 135–145)
TCO2: 17 mmol/L — ABNORMAL LOW (ref 22–32)

## 2020-01-31 SURGERY — TURP (TRANSURETHRAL RESECTION OF PROSTATE)
Anesthesia: General | Site: Prostate

## 2020-01-31 MED ORDER — SULFAMETHOXAZOLE-TRIMETHOPRIM 800-160 MG PO TABS
1.0000 | ORAL_TABLET | Freq: Two times a day (BID) | ORAL | Status: DC
  Administered 2020-01-31 (×2): 1 via ORAL
  Filled 2020-01-31 (×2): qty 1

## 2020-01-31 MED ORDER — HYDROCHLOROTHIAZIDE 12.5 MG PO CAPS
12.5000 mg | ORAL_CAPSULE | Freq: Every day | ORAL | Status: DC
  Filled 2020-01-31: qty 1

## 2020-01-31 MED ORDER — OXYCODONE-ACETAMINOPHEN 5-325 MG PO TABS
ORAL_TABLET | ORAL | Status: AC
  Filled 2020-01-31: qty 1

## 2020-01-31 MED ORDER — OXYCODONE-ACETAMINOPHEN 5-325 MG PO TABS
1.0000 | ORAL_TABLET | Freq: Four times a day (QID) | ORAL | Status: DC
  Administered 2020-01-31 – 2020-02-01 (×4): 1 via ORAL

## 2020-01-31 MED ORDER — SODIUM CHLORIDE 0.45 % IV SOLN: INTRAVENOUS | Status: DC

## 2020-01-31 MED ORDER — OXYCODONE HCL 5 MG PO TABS
ORAL_TABLET | ORAL | Status: AC
  Filled 2020-01-31: qty 2

## 2020-01-31 MED ORDER — OXYCODONE HCL 5 MG PO TABS
5.0000 mg | ORAL_TABLET | Freq: Four times a day (QID) | ORAL | Status: DC
  Administered 2020-01-31 – 2020-02-01 (×4): 5 mg via ORAL

## 2020-01-31 MED ORDER — CIPROFLOXACIN IN D5W 400 MG/200ML IV SOLN
400.0000 mg | INTRAVENOUS | Status: AC
  Administered 2020-01-31: 400 mg via INTRAVENOUS

## 2020-01-31 MED ORDER — ACETAMINOPHEN 325 MG PO TABS
ORAL_TABLET | ORAL | Status: AC
  Filled 2020-01-31: qty 1

## 2020-01-31 MED ORDER — FLUTICASONE PROPIONATE 50 MCG/ACT NA SUSP: 2.0000 | NASAL | Status: DC | PRN

## 2020-01-31 MED ORDER — LACTATED RINGERS IV SOLN: INTRAVENOUS | Status: DC

## 2020-01-31 MED ORDER — ZOLPIDEM TARTRATE 5 MG PO TABS
ORAL_TABLET | ORAL | Status: AC
  Filled 2020-01-31: qty 1

## 2020-01-31 MED ORDER — FENTANYL CITRATE (PF) 100 MCG/2ML IJ SOLN
INTRAMUSCULAR | Status: DC | PRN
  Administered 2020-01-31: 100 ug via INTRAVENOUS

## 2020-01-31 MED ORDER — PROPOFOL 10 MG/ML IV BOLUS
INTRAVENOUS | Status: AC
  Filled 2020-01-31: qty 20

## 2020-01-31 MED ORDER — HYDROMORPHONE HCL 1 MG/ML IJ SOLN
0.2500 mg | INTRAMUSCULAR | Status: DC | PRN
  Administered 2020-01-31 (×2): 0.5 mg via INTRAVENOUS

## 2020-01-31 MED ORDER — HYDROMORPHONE HCL 1 MG/ML IJ SOLN
INTRAMUSCULAR | Status: AC
  Filled 2020-01-31: qty 1

## 2020-01-31 MED ORDER — CIPROFLOXACIN IN D5W 400 MG/200ML IV SOLN
INTRAVENOUS | Status: AC
  Filled 2020-01-31: qty 200

## 2020-01-31 MED ORDER — MIDAZOLAM HCL 2 MG/2ML IJ SOLN
INTRAMUSCULAR | Status: AC
  Filled 2020-01-31: qty 2

## 2020-01-31 MED ORDER — SENNA 8.6 MG PO TABS
ORAL_TABLET | ORAL | Status: AC
  Filled 2020-01-31: qty 1

## 2020-01-31 MED ORDER — MIDAZOLAM HCL 5 MG/5ML IJ SOLN
INTRAMUSCULAR | Status: DC | PRN
  Administered 2020-01-31: 2 mg via INTRAVENOUS

## 2020-01-31 MED ORDER — DEXAMETHASONE SODIUM PHOSPHATE 10 MG/ML IJ SOLN
INTRAMUSCULAR | Status: DC | PRN
  Administered 2020-01-31: 10 mg via INTRAVENOUS

## 2020-01-31 MED ORDER — OXYCODONE-ACETAMINOPHEN 10-325 MG PO TABS: 1.0000 | ORAL_TABLET | Freq: Four times a day (QID) | ORAL | Status: DC

## 2020-01-31 MED ORDER — IRBESARTAN 150 MG PO TABS
150.0000 mg | ORAL_TABLET | Freq: Every day | ORAL | Status: DC
  Filled 2020-01-31: qty 1

## 2020-01-31 MED ORDER — DEXAMETHASONE SODIUM PHOSPHATE 10 MG/ML IJ SOLN
INTRAMUSCULAR | Status: AC
  Filled 2020-01-31: qty 1

## 2020-01-31 MED ORDER — ZOLPIDEM TARTRATE 5 MG PO TABS
5.0000 mg | ORAL_TABLET | Freq: Every evening | ORAL | Status: DC | PRN
  Administered 2020-01-31: 5 mg via ORAL

## 2020-01-31 MED ORDER — FENTANYL CITRATE (PF) 100 MCG/2ML IJ SOLN
INTRAMUSCULAR | Status: AC
  Filled 2020-01-31: qty 2

## 2020-01-31 MED ORDER — BELLADONNA ALKALOIDS-OPIUM 16.2-60 MG RE SUPP: 1.0000 | Freq: Four times a day (QID) | RECTAL | Status: DC | PRN

## 2020-01-31 MED ORDER — OXYCODONE HCL 5 MG PO TABS
ORAL_TABLET | ORAL | Status: AC
  Filled 2020-01-31: qty 1

## 2020-01-31 MED ORDER — HYDROMORPHONE HCL 1 MG/ML IJ SOLN
INTRAMUSCULAR | Status: DC | PRN
Start: 2020-01-31 — End: 2020-01-31
  Administered 2020-01-31 (×2): .5 mg via INTRAVENOUS
  Administered 2020-01-31: 1 mg via INTRAVENOUS

## 2020-01-31 MED ORDER — ONDANSETRON HCL 4 MG/2ML IJ SOLN
INTRAMUSCULAR | Status: DC | PRN
  Administered 2020-01-31: 4 mg via INTRAVENOUS

## 2020-01-31 MED ORDER — HYDROMORPHONE HCL 2 MG/ML IJ SOLN
INTRAMUSCULAR | Status: AC
  Filled 2020-01-31: qty 1

## 2020-01-31 MED ORDER — PANTOPRAZOLE SODIUM 40 MG PO TBEC
40.0000 mg | DELAYED_RELEASE_TABLET | Freq: Every day | ORAL | Status: DC
  Administered 2020-01-31: 40 mg via ORAL

## 2020-01-31 MED ORDER — LIDOCAINE HCL (PF) 2 % IJ SOLN
INTRAMUSCULAR | Status: AC
  Filled 2020-01-31: qty 5

## 2020-01-31 MED ORDER — ROSUVASTATIN CALCIUM 10 MG PO TABS
10.0000 mg | ORAL_TABLET | Freq: Every day | ORAL | Status: DC
  Administered 2020-01-31: 10 mg via ORAL
  Filled 2020-01-31: qty 1

## 2020-01-31 MED ORDER — ACETAMINOPHEN 325 MG PO TABS: 650.0000 mg | ORAL_TABLET | ORAL | Status: DC | PRN

## 2020-01-31 MED ORDER — ONDANSETRON HCL 4 MG/2ML IJ SOLN
INTRAMUSCULAR | Status: AC
  Filled 2020-01-31: qty 2

## 2020-01-31 MED ORDER — ACETAMINOPHEN 500 MG PO TABS
ORAL_TABLET | ORAL | Status: AC
  Filled 2020-01-31: qty 2

## 2020-01-31 MED ORDER — SODIUM CHLORIDE 0.9 % IR SOLN
Status: DC | PRN
  Administered 2020-01-31: 11000 mL via INTRAVESICAL

## 2020-01-31 MED ORDER — SENNA 8.6 MG PO TABS: 1.0000 | ORAL_TABLET | Freq: Two times a day (BID) | ORAL | Status: DC

## 2020-01-31 MED ORDER — ACETAMINOPHEN 500 MG PO TABS
1000.0000 mg | ORAL_TABLET | Freq: Once | ORAL | Status: AC
  Administered 2020-01-31: 1000 mg via ORAL

## 2020-01-31 MED ORDER — FENTANYL CITRATE (PF) 100 MCG/2ML IJ SOLN: 25.0000 ug | INTRAMUSCULAR | Status: DC | PRN

## 2020-01-31 MED ORDER — PANTOPRAZOLE SODIUM 40 MG PO TBEC
DELAYED_RELEASE_TABLET | ORAL | Status: AC
  Filled 2020-01-31: qty 1

## 2020-01-31 MED ORDER — 0.9 % SODIUM CHLORIDE (POUR BTL) OPTIME
TOPICAL | Status: DC | PRN
  Administered 2020-01-31: 500 mL

## 2020-01-31 MED ORDER — SODIUM CHLORIDE 0.9 % IR SOLN
3000.0000 mL | Status: DC
  Administered 2020-01-31 (×4): 3000 mL

## 2020-01-31 MED ORDER — AMLODIPINE BESYLATE 10 MG PO TABS
10.0000 mg | ORAL_TABLET | Freq: Every day | ORAL | Status: DC
  Filled 2020-01-31: qty 1

## 2020-01-31 SURGICAL SUPPLY — 33 items
BAG DRAIN URO-CYSTO SKYTR STRL (DRAIN) ×3 IMPLANT
BAG DRN RND TRDRP ANRFLXCHMBR (UROLOGICAL SUPPLIES) ×1
BAG DRN UROCATH (DRAIN) ×1
BAG URINE DRAIN 2000ML AR STRL (UROLOGICAL SUPPLIES) ×3 IMPLANT
BAG URINE LEG 500ML (DRAIN) IMPLANT
CATH FOLEY 2WAY SLVR  5CC 20FR (CATHETERS)
CATH FOLEY 2WAY SLVR  5CC 22FR (CATHETERS)
CATH FOLEY 2WAY SLVR 30CC 22FR (CATHETERS) IMPLANT
CATH FOLEY 2WAY SLVR 5CC 20FR (CATHETERS) IMPLANT
CATH FOLEY 2WAY SLVR 5CC 22FR (CATHETERS) IMPLANT
CATH FOLEY 3WAY 30CC 22FR (CATHETERS) ×3 IMPLANT
CATH HEMA 3WAY 30CC 24FR COUDE (CATHETERS) IMPLANT
CATH HEMA 3WAY 30CC 24FR RND (CATHETERS) IMPLANT
CLOTH BEACON ORANGE TIMEOUT ST (SAFETY) ×3 IMPLANT
ELECT REM PT RETURN 9FT ADLT (ELECTROSURGICAL) ×3
ELECTRODE REM PT RTRN 9FT ADLT (ELECTROSURGICAL) ×1 IMPLANT
EVACUATOR MICROVAS BLADDER (UROLOGICAL SUPPLIES) ×3 IMPLANT
GLOVE BIO SURGEON STRL SZ8 (GLOVE) ×3 IMPLANT
GOWN STRL REUS W/TWL XL LVL3 (GOWN DISPOSABLE) ×3 IMPLANT
HOLDER FOLEY CATH W/STRAP (MISCELLANEOUS) ×3 IMPLANT
IV NS IRRIG 3000ML ARTHROMATIC (IV SOLUTION) ×21 IMPLANT
KIT TURNOVER CYSTO (KITS) ×3 IMPLANT
LOOP CUT BIPOLAR 24F LRG (ELECTROSURGICAL) ×3 IMPLANT
MANIFOLD NEPTUNE II (INSTRUMENTS) ×3 IMPLANT
PACK CYSTO (CUSTOM PROCEDURE TRAY) ×3 IMPLANT
PLUG CATH AND CAP STER (CATHETERS) IMPLANT
SYR 30ML LL (SYRINGE) ×3 IMPLANT
SYR TOOMEY IRRIG 70ML (MISCELLANEOUS)
SYRINGE TOOMEY IRRIG 70ML (MISCELLANEOUS) IMPLANT
TUBE CONNECTING 12'X1/4 (SUCTIONS) ×1
TUBE CONNECTING 12X1/4 (SUCTIONS) ×2 IMPLANT
TUBING UROLOGY SET (TUBING) ×3 IMPLANT
WATER STERILE IRR 500ML POUR (IV SOLUTION) ×3 IMPLANT

## 2020-01-31 NOTE — Anesthesia Postprocedure Evaluation (Signed)
Anesthesia Post Note  Patient: Dylan [name]  Procedure(s) Performed: TRANSURETHRAL RESECTION OF THE PROSTATE (TURP) (N/A Prostate)     Patient location during evaluation: PACU Anesthesia Type: General Level of consciousness: awake and alert Pain management: pain level controlled Vital Signs Assessment: post-procedure vital signs reviewed and stable Respiratory status: spontaneous breathing, nonlabored ventilation, respiratory function stable and patient connected to nasal cannula oxygen Cardiovascular status: blood pressure returned to baseline and stable Postop Assessment: no apparent nausea or vomiting Anesthetic complications: no   No complications documented.  Last Vitals:  Vitals:   01/31/20 0945 01/31/20 0950  BP:  136/79  Pulse:  84  Resp:  16  Temp:  36.7 C  SpO2: 94% 95%    Last Pain:  Vitals:   01/31/20 0950  TempSrc:   PainSc: 6                  Odester Nilson L Reon Hunley

## 2020-01-31 NOTE — Discharge Instructions (Signed)
Transurethral Resection of the Prostate ° °Care After ° °Refer to this sheet in the next few weeks. These discharge instructions provide you with general information on caring for yourself after you leave the hospital. Your caregiver may also give you specific instructions. Your treatment has been planned according to the most current medical practices available, but unavoidable complications sometimes occur. If you have any problems or questions after discharge, please call your caregiver. ° °HOME CARE INSTRUCTIONS  ° °Medications °· You may receive medicine for pain management. As your level of discomfort decreases, adjustments in your pain medicines may be made.  °· Take all medicines as directed.  °· You may be given a medicine (antibiotic) to kill germs following surgery. Finish all medicines. Let your caregiver know if you have any side effects or problems from the medicine.  °· If you are on aspirin, it would be best not to restart the aspirin until the blood in the urine clears °Hygiene °· You can take a shower after surgery.  °· You should not take a bath while you still have the urethral catheter. °Activity °· You will be encouraged to get out of bed as much as possible and increase your activity level as tolerated.  °· Spend the first week in and around your home. For 3 weeks, avoid the following:  °· Straining.  °· Running.  °· Strenuous work.  °· Walks longer than a few blocks.  °· Riding for extended periods.  °· Sexual relations.  °· Do not lift heavy objects (more than 20 pounds) for at least 1 month. When lifting, use your arms instead of your abdominal muscles.  °· You will be encouraged to walk as tolerated. Do not exert yourself. Increase your activity level slowly. Remember that it is important to keep moving after an operation of any type. This cuts down on the possibility of developing blood clots.  °· Your caregiver will tell you when you can resume driving and light housework. Discuss this  at your first office visit after discharge. °Diet °· No special diet is ordered after a TURP. However, if you are on a special diet for another medical problem, it should be continued.  °· Normal fluid intake is usually recommended.  °· Avoid alcohol and caffeinated drinks for 2 weeks. They irritate the bladder. Decaffeinated drinks are okay.  °· Avoid spicy foods.  °Bladder Function °· For the first 10 days, empty the bladder whenever you feel a definite desire. Do not try to hold the urine for long periods of time.  °· Urinating once or twice a night even after you are healed is not uncommon.  °· You may see some recurrence of blood in the urine after discharge from the hospital. This usually happens within 2 weeks after the procedure.If this occurs, force fluids again as you did in the hospital and reduce your activity.  °Bowel Function °· You may experience some constipation after surgery. This can be minimized by increasing fluids and fiber in your diet. Drink enough water and fluids to keep your urine clear or pale yellow.  °· A stool softener may be prescribed for use at home. Do not strain to move your bowels.  °· If you are requiring increased pain medicine, it is important that you take stool softeners to prevent constipation. This will help to promote proper healing by reducing the need to strain to move your bowels.  °Sexual Activity °· Semen movement in the opposite direction and into the bladder (  retrograde ejaculation) may occur. Since the semen passes into the bladder, cloudy urine can occur the first time you urinate after intercourse. Or, you may not have an ejaculation during erection. Ask your caregiver when you can resume sexual activity. Retrograde ejaculation and reduced semen discharge should not reduce one's pleasure of intercourse.  °Postoperative Visit °· Arrange the date and time of your after surgery visit with your caregiver.  °Return to Work °· After your recovery is complete, you will  be able to return to work and resume all activities. Your caregiver will inform you when you can return to work.  °Foley Catheter Care °A soft, flexible tube (Foley catheter) may have been placed in your bladder to drain urine and fluid. Follow these instructions: °Taking Care of the Catheter °· Keep the area where the catheter leaves your body clean.  °· Attach the catheter to the leg so there is no tension on the catheter.  °· Keep the drainage bag below the level of the bladder, but keep it OFF the floor.  °· Do not take long soaking baths. Your caregiver will give instructions about showering.  °· Wash your hands before touching ANYTHING related to the catheter or bag.  °· Using mild soap and warm water on a washcloth:  °· Clean the area closest to the catheter insertion site using a circular motion around the catheter.  °· Clean the catheter itself by wiping AWAY from the insertion site for several inches down the tube.  °· NEVER wipe upward as this could sweep bacteria up into the urethra (tube in your body that normally drains the bladder) and cause infection.  °· Place a small amount of sterile lubricant at the tip of the penis where the catheter is entering.  °Taking Care of the Drainage Bags °· Two drainage bags may be taken home: a large overnight drainage bag, and a smaller leg bag which fits underneath clothing.  °· It is okay to wear the overnight bag at any time, but NEVER wear the smaller leg bag at night.  °· Keep the drainage bag well below the level of your bladder. This prevents backflow of urine into the bladder and allows the urine to drain freely.  °· Anchor the tubing to your leg to prevent pulling or tension on the catheter. Use tape or a leg strap provided by the hospital.  °· Empty the drainage bag when it is 1/2 to 3/4 full. Wash your hands before and after touching the bag.  °· Periodically check the tubing for kinks to make sure there is no pressure on the tubing which could restrict  the flow of urine.  °Changing the Drainage Bags °· Cleanse both ends of the clean bag with alcohol before changing.  °· Pinch off the rubber catheter to avoid urine spillage during the disconnection.  °· Disconnect the dirty bag and connect the clean one.  °· Empty the dirty bag carefully to avoid a urine spill.  °· Attach the new bag to the leg with tape or a leg strap.  °Cleaning the Drainage Bags °· Whenever a drainage bag is disconnected, it must be cleaned quickly so it is ready for the next use.  °· Wash the bag in warm, soapy water.  °· Rinse the bag thoroughly with warm water.  °· Soak the bag for 30 minutes in a solution of white vinegar and water (1 cup vinegar to 1 quart warm water).  °· Rinse with warm water.  °SEEK MEDICAL   CARE IF:  °· You have chills or night sweats.  °· You are leaking around your catheter or have problems with your catheter. It is not uncommon to have sporadic leakage around your catheter as a result of bladder spasms. If the leakage stops, there is not much need for concern. If you are uncertain, call your caregiver.  °· You develop side effects that you think are coming from your medicines.  °SEEK IMMEDIATE MEDICAL CARE IF:  °· You are suddenly unable to urinate. Check to see if there are any kinks in the drainage tubing that may cause this. If you cannot find any kinks, call your caregiver immediately. This is an emergency.  °· You develop shortness of breath or chest pains.  °· Bleeding persists or clots develop in your urine.  °· You have a fever.  °· You develop pain in your back or over your lower belly (abdomen).  °· You develop pain or swelling in your legs.  °· Any problems you are having get worse rather than better.  °MAKE SURE YOU:  °· Understand these instructions.  °· Will watch your condition.  °· Will get help right away if you are not doing well or get worse.  °Document Released: 02/11/2005 Document Revised: 10/24/2010 Document Reviewed: 10/05/2008 °[employer]®  Patient Information ©2012 [employer].Transurethral Resection of the Prostate °Care After °Refer to this sheet in the next few weeks. These discharge instructions provide you with general information on caring for yourself after you leave the hospital. Your caregiver may also give you specific instructions. Your treatment has been planned according to the most current medical practices available, but unavoidable complications sometimes occur. If you have any problems or questions after discharge, please call your caregiver. °

## 2020-01-31 NOTE — Anesthesia Procedure Notes (Signed)
Procedure Name: LMA Insertion Date/Time: 01/31/2020 7:51 AM Performed by: Takuya Lariccia D, CRNA Pre-anesthesia Checklist: Patient identified, Emergency Drugs available, Suction available and Patient being monitored Patient Re-evaluated:Patient Re-evaluated prior to induction Oxygen Delivery Method: Circle system utilized Preoxygenation: Pre-oxygenation with 100% oxygen Induction Type: IV induction Ventilation: Mask ventilation without difficulty LMA: LMA inserted LMA Size: 4.0 Tube type: Oral Number of attempts: 1 Placement Confirmation: positive ETCO2 and breath sounds checked- equal and bilateral Tube secured with: Tape Dental Injury: Teeth and Oropharynx as per pre-operative assessment

## 2020-01-31 NOTE — Transfer of Care (Signed)
Immediate Anesthesia Transfer of Care Note  Patient: Dylan [name]  Procedure(s) Performed: TRANSURETHRAL RESECTION OF THE PROSTATE (TURP) (N/A Prostate)  Patient Location: PACU  Anesthesia Type:General  Level of Consciousness: awake, alert  and oriented  Airway & Oxygen Therapy: Patient Spontanous Breathing and Patient connected to nasal cannula oxygen  Post-op Assessment: Report given to RN and Post -op Vital signs reviewed and stable  Post vital signs: Reviewed and stable  Last Vitals:  Vitals Value Taken Time  BP 144/81 01/31/20 0851  Temp 36.7 C 01/31/20 0851  Pulse 80 01/31/20 0852  Resp 12 01/31/20 0852  SpO2 99 % 01/31/20 0852  Vitals shown include unvalidated device data.  Last Pain:  Vitals:   01/31/20 0558  TempSrc: Oral  PainSc: 8       Patients Stated Pain Goal: 5 (25/74/93 5521)  Complications: No complications documented.

## 2020-01-31 NOTE — Anesthesia Preprocedure Evaluation (Addendum)
Anesthesia Evaluation  Patient identified by MRN, date of birth, ID band Patient awake    Reviewed: Allergy & Precautions, NPO status , Patient's Chart, lab work & pertinent test results  History of Anesthesia Complications (+) PSEUDOCHOLINESTERASE DEFICIENCY  Airway Mallampati: III  TM Distance: >3 FB Neck ROM: Full    Dental no notable dental hx. (+) Teeth Intact, Dental Advisory Given   Pulmonary sleep apnea ,    Pulmonary exam normal breath sounds clear to auscultation       Cardiovascular hypertension, Pt. on medications + CAD  Normal cardiovascular exam Rhythm:Regular Rate:Normal  HLD  LHC 2019 Prox LAD lesion is 10% stenosed. Nonobstructive CAD. The left ventricular systolic function is normal. LV end diastolic pressure is mildly elevated. LVEDP 18 mm Hg. The left ventricular ejection fraction is 55-65% by visual estimate. There is no aortic valve stenosis.    Neuro/Psych  Headaches, PSYCHIATRIC DISORDERS Anxiety    GI/Hepatic GERD  Medicated and Controlled,(+)     Substance abuse: takes oxycodone 10mg  QID.  , Hepatitis -, A  Endo/Other  negative endocrine ROS  Renal/GU negative Renal ROS  negative genitourinary   Musculoskeletal  (+) Arthritis , narcotic dependent  Abdominal   Peds  Hematology negative hematology ROS (+)   Anesthesia Other Findings Pseudocholinesterase deficiency     Reproductive/Obstetrics                         Anesthesia Physical Anesthesia Plan  ASA: III  Anesthesia Plan: General   Post-op Pain Management:    Induction: Intravenous  PONV Risk Score and Plan: 2 and Ondansetron, Dexamethasone and Midazolam  Airway Management Planned: LMA  Additional Equipment:   Intra-op Plan:   Post-operative Plan: Extubation in OR  Informed Consent: I have reviewed the patients History and Physical, chart, labs and discussed the procedure including  the risks, benefits and alternatives for the proposed anesthesia with the patient or authorized representative who has indicated his/her understanding and acceptance.     Dental advisory given  Plan Discussed with: CRNA  Anesthesia Plan Comments:         Anesthesia Quick Evaluation

## 2020-01-31 NOTE — Interval H&P Note (Signed)
History and Physical Interval Note:  01/31/2020 7:36 AM  Dylan [name]  has presented today for surgery, with the diagnosis of BENIGN PROSTATIC HYPERPLASIA.  The various methods of treatment have been discussed with the patient and family. After consideration of risks, benefits and other options for treatment, the patient has consented to  Procedure(s) with comments: DISH (TURP) (N/A) - 75 MIS as a surgical intervention.  The patient's history has been reviewed, patient examined, no change in status, stable for surgery.  I have reviewed the patient's chart and labs.  Questions were answered to the patient's satisfaction.     Lillette Boxer Odysseus Cada

## 2020-01-31 NOTE — Op Note (Signed)
Preoperative diagnosis: 1. Bladder outlet obstruction secondary to BPH  Postoperative diagnosis:  1. Bladder outlet obstruction secondary to BPH  Procedure:  1. Cystoscopy 2. Transurethral resection of the prostate  Surgeon: Lillette Boxer. Michae Grimley, M.D.  Anesthesia: General  Complications: None  Drain: Foley catheter  EBL: Minimal  Specimens: 1. Prostate chips  Disposition of specimens: Pathology  Indication: Dylan [name] is a patient with bladder outlet obstruction secondary to benign prostatic hyperplasia. After reviewing the management options for treatment, he elected to proceed with the above surgical procedure(s). We have discussed the potential benefits and risks of the procedure, side effects of the proposed treatment, the likelihood of the patient achieving the goals of the procedure, and any potential problems that might occur during the procedure or recuperation. Informed consent has been obtained.  Description of procedure:  The patient was identified in the holding area. He received preoperative antibiotics. He was then taken to the operating room. General anesthetic was administered.  The patient was then placed in the dorsal lithotomy position, prepped and draped in the usual sterile fashion. Timeout was then performed.  A resectoscope sheath was placed using the visual obturator.   The bladder was then systematically examined in its entirety. There was no evidence of  tumors, stones, or other mucosal pathology. 2+ trabeculations noted.The resectoscope, loop and telescope were then placed.  The ureteral orifices were identified so as to be avoided during the procedure.  The prostate adenoma was then resected utilizing loop cautery resection with the bipolar cutting loop.  The prostate adenoma from the bladder neck back to the verumontanum was resected beginning at the six o'clock position to take down the median lobe, and then extended to include the right and  left lobes of the prostate and anterior prostate, respectively. Care was taken not to resect distal to the verumontanum.  Hemostasis was then achieved with the cautery and the bladder was emptied and reinspected with no significant bleeding noted at the end of the procedure.  Resected chips were irrigated from the bladder with the evacuator and sent to pathology.  A 3 way catheter was then placed into the bladder and placed on continuous bladder irrigation.  The patient appeared to tolerate the procedure well and without complications. The patient was able to be awakened and transferred to the recovery unit in satisfactory condition. He tolerated the procedure well.

## 2020-02-01 ENCOUNTER — Encounter (HOSPITAL_BASED_OUTPATIENT_CLINIC_OR_DEPARTMENT_OTHER): Payer: Self-pay | Admitting: Urology

## 2020-02-01 DIAGNOSIS — I2510 Atherosclerotic heart disease of native coronary artery without angina pectoris: Secondary | ICD-10-CM | POA: Diagnosis not present

## 2020-02-01 DIAGNOSIS — Z96652 Presence of left artificial knee joint: Secondary | ICD-10-CM | POA: Diagnosis not present

## 2020-02-01 DIAGNOSIS — I10 Essential (primary) hypertension: Secondary | ICD-10-CM | POA: Diagnosis not present

## 2020-02-01 DIAGNOSIS — Z79899 Other long term (current) drug therapy: Secondary | ICD-10-CM | POA: Diagnosis not present

## 2020-02-01 DIAGNOSIS — N320 Bladder-neck obstruction: Secondary | ICD-10-CM | POA: Diagnosis not present

## 2020-02-01 DIAGNOSIS — N401 Enlarged prostate with lower urinary tract symptoms: Secondary | ICD-10-CM | POA: Diagnosis not present

## 2020-02-01 MED ORDER — OXYCODONE HCL 5 MG PO TABS
ORAL_TABLET | ORAL | Status: AC
  Filled 2020-02-01: qty 1

## 2020-02-01 MED ORDER — OXYCODONE-ACETAMINOPHEN 5-325 MG PO TABS
ORAL_TABLET | ORAL | Status: AC
  Filled 2020-02-01: qty 1

## 2020-02-01 NOTE — Discharge Summary (Signed)
Physician Discharge Summary  Patient ID: Dylan [name] MRN: [id number] DOB/AGE: Sep 21, 1950 69 y.o.  Admit date: 01/31/2020 Discharge date: 02/01/2020  Admission Diagnoses:  Enlarged prostate with urinary obstruction  Discharge Diagnoses:  Principal Problem:   Enlarged prostate with urinary obstruction   Past Medical History:  Diagnosis Date  . Allergy    allergic rhinitis  . Anxiety   . Arthritis    R knee, hands , back oa  . BPH (benign prostatic hyperplasia)   . Cancer ([place])    melonoma- on face, treated with excision   . Complication of anesthesia    trouble waking up with succycholine, does not have enzyme to break down succycholine  . Coronary artery disease    nonobstructive  . Dizziness    MRI brain unremarkable 02/05/2015. resolved as of 01-27-2020  . GERD (gastroesophageal reflux disease)   . Hepatitis age 83   history of infections Hepatitis type a  . History of melanoma 15 yrs ago   face and forehead-- previously followed by Derm in [place]  . History of migraine    none recent as of 01-27-2020  . History of ulcerative colitis    non recent flares  . Hyperlipidemia   . Hypertension   . Sleep apnea    last sleep study 10 yrs ago,CPAP-  no current cpap use     Surgeries: Procedure(s): TRANSURETHRAL RESECTION OF THE PROSTATE (TURP) on 01/31/2020   Consultants (if any):   Discharged Condition: Improved  Hospital Course: Dylan [name] is an 69 y.o. male who was admitted 01/31/2020 with a diagnosis of Enlarged prostate with urinary obstruction and went to the operating room on 01/31/2020 and underwent the above named procedures.  His foley was removed on 12/7.   He was able to void.  The urine was bloody but the PVR was only 28ml.  He was felt to be ready for discharge.  He was given perioperative antibiotics:  Anti-infectives (From admission, onward)   Start     Dose/Rate Route Frequency Ordered Stop   01/31/20 1200  sulfamethoxazole-trimethoprim (BACTRIM  DS) 800-160 MG per tablet 1 tablet        1 tablet Oral Every 12 hours 01/31/20 1002     01/31/20 0533  ciprofloxacin (CIPRO) IVPB 400 mg        400 mg 200 mL/hr over 60 Minutes Intravenous 60 min pre-op 01/31/20 0533 01/31/20 0752    .  He was given sequential compression devices for DVT prophylaxis.  He benefited maximally from the hospital stay and there were no complications.    Recent vital signs:  Vitals:   02/01/20 0200 02/01/20 0515  BP: 131/81 (!) 119/91  Pulse: 83 75  Resp: 18 18  Temp: 98.1 F (36.7 C) 98.1 F (36.7 C)  SpO2: 96% 97%    Recent laboratory studies:  Lab Results  Component Value Date   HGB 14.3 01/31/2020   HGB 13.7 01/14/2020   HGB 12.8 (L) 04/14/2019   Lab Results  Component Value Date   WBC 8.2 01/14/2020   PLT 289 01/14/2020   Lab Results  Component Value Date   INR 0.9 04/14/2019   Lab Results  Component Value Date   NA 142 01/31/2020   K 3.7 01/31/2020   CL 98 01/31/2020   CO2 22 01/14/2020   BUN 11 01/31/2020   CREATININE 0.60 (L) 01/31/2020   GLUCOSE 127 (H) 01/31/2020    Discharge Medications:   Allergies as of 02/01/2020  Reactions   Heparin Anaphylaxis   REACTION: breathing problems   Succinylcholine Chloride Other (See Comments)   REACTION: does not wake up from anesthetic, pt. Reports that through testing that he was found  not to have the enzyme to breakdown the Succ.   Vancomycin Hives, Other (See Comments)   Ran off a pump. So developed redman's . Running too fast   Penicillins Rash   Did it involve swelling of the face/tongue/throat, SOB, or low BP? No Did it involve sudden or severe rash/hives, skin peeling, or any reaction on the inside of your mouth or nose? No Did you need to seek medical attention at a hospital or doctor's office? No When did it last happen?childhood If all above answers are "NO", may proceed with cephalosporin use.      Medication List    TAKE these medications    acetaminophen 650 MG CR tablet Commonly known as: TYLENOL Take 1,300 mg by mouth every 8 (eight) hours as needed for pain.   amLODipine 10 MG tablet Commonly known as: NORVASC TAKE 1 TABLET BY MOUTH EVERY DAY   aspirin EC 81 MG tablet Take 1 tablet (81 mg total) by mouth daily.   co-enzyme Q-10 30 MG capsule Take 30 mg by mouth daily.   dutasteride 0.5 MG capsule Commonly known as: AVODART TAKE 1 CAPSULE BY MOUTH EVERY DAY What changed: how much to take   esomeprazole 40 MG capsule Commonly known as: NEXIUM TAKE 1 CAPSULE BY MOUTH EVERY DAY BEFORE BREAKFAST   fluticasone 50 MCG/ACT nasal spray Commonly known as: FLONASE Place 2 sprays into both nostrils daily. What changed:   when to take this  reasons to take this   hydrochlorothiazide 12.5 MG capsule Commonly known as: MICROZIDE Take 1 capsule (12.5 mg total) by mouth daily.   irbesartan 75 MG tablet Commonly known as: AVAPRO TAKE 1 TABLET BY MOUTH EVERY DAY What changed: how much to take   melatonin 5 MG Tabs Take 1 tablet by mouth at bedtime.   neomycin-polymyxin-hydrocortisone 3.5-10000-1 OTIC suspension Commonly known as: CORTISPORIN Place 4 drops into the left ear 3 (three) times daily. What changed:   when to take this  reasons to take this   oxyCODONE-acetaminophen 10-325 MG tablet Commonly known as: PERCOCET Take 1 tablet by mouth 4 (four) times daily.   rosuvastatin 10 MG tablet Commonly known as: CRESTOR TAKE 1 TABLET BY MOUTH EVERY DAY   tamsulosin 0.4 MG Caps capsule Commonly known as: FLOMAX Take 0.4 mg by mouth daily.   zolpidem 10 MG tablet Commonly known as: AMBIEN TAKE 1 TABLET BY MOUTH EVERYDAY AT BEDTIME       Diagnostic Studies: No results found.  Disposition: Discharge disposition: 01-Home or Self Care       Discharge Instructions    Discontinue IV   Complete by: As directed        Follow-up Information    Karen Kays, NP.   Specialty: Nurse  Practitioner Why: 12.27.2021 @ 0815 Contact information: [address] [place] [address] [phone]                Signed: Irine Seal 02/01/2020, 8:48 AM

## 2020-02-02 LAB — SURGICAL PATHOLOGY

## 2020-02-07 DIAGNOSIS — Z96652 Presence of left artificial knee joint: Secondary | ICD-10-CM | POA: Diagnosis not present

## 2020-02-07 DIAGNOSIS — T8484XA Pain due to internal orthopedic prosthetic devices, implants and grafts, initial encounter: Secondary | ICD-10-CM | POA: Diagnosis not present

## 2020-02-07 DIAGNOSIS — G5782 Other specified mononeuropathies of left lower limb: Secondary | ICD-10-CM | POA: Diagnosis not present

## 2020-02-09 DIAGNOSIS — M5136 Other intervertebral disc degeneration, lumbar region: Secondary | ICD-10-CM | POA: Diagnosis not present

## 2020-02-09 DIAGNOSIS — M5416 Radiculopathy, lumbar region: Secondary | ICD-10-CM | POA: Diagnosis not present

## 2020-02-09 DIAGNOSIS — T8484XD Pain due to internal orthopedic prosthetic devices, implants and grafts, subsequent encounter: Secondary | ICD-10-CM | POA: Diagnosis not present

## 2020-02-10 ENCOUNTER — Other Ambulatory Visit: Payer: Self-pay | Admitting: Internal Medicine

## 2020-02-15 DIAGNOSIS — M5416 Radiculopathy, lumbar region: Secondary | ICD-10-CM | POA: Diagnosis not present

## 2020-02-15 DIAGNOSIS — M545 Low back pain, unspecified: Secondary | ICD-10-CM | POA: Diagnosis not present

## 2020-02-17 ENCOUNTER — Telehealth: Payer: Self-pay | Admitting: *Deleted

## 2020-02-17 DIAGNOSIS — M5416 Radiculopathy, lumbar region: Secondary | ICD-10-CM | POA: Diagnosis not present

## 2020-02-17 DIAGNOSIS — Z6833 Body mass index (BMI) 33.0-33.9, adult: Secondary | ICD-10-CM | POA: Diagnosis not present

## 2020-02-17 DIAGNOSIS — I10 Essential (primary) hypertension: Secondary | ICD-10-CM | POA: Diagnosis not present

## 2020-02-17 NOTE — Telephone Encounter (Signed)
   [facility] Pre-operative Risk Assessment    HEARTCARE STAFF: - Please ensure there is not already an duplicate clearance open for this procedure. - Under Visit Info/Reason for Call, type in Other and utilize the format Clearance MM/DD/YY or Clearance TBD. Do not use dashes or single digits. - If request is for dental extraction, please clarify the # of teeth to be extracted.  Request for surgical clearance:  1. What type of surgery is being performed? LEFT KNEE EXCISION SAPHENOUS NERVE BRANCH, NEUROTOMY, LEFT OPEN   2. When is this surgery scheduled? 03/13/20   3. What type of clearance is required (medical clearance vs. Pharmacy clearance to hold med vs. Both)? MEDICAL  4. Are there any medications that need to be held prior to surgery and how long? ASA   5. Practice name and name of physician performing surgery? [facility]; DR. FRANK ALUISIO   6. What is the office phone number? [phone]   7.   What is the office fax number? [place]  8.   Anesthesia type (None, local, MAC, general) ? SPINAL   Julaine Hua 02/17/2020, 8:55 AM  _________________________________________________________________   (provider comments below)

## 2020-02-17 NOTE — Telephone Encounter (Signed)
   Primary Cardiologist: Dorris Carnes, MD  Chart reviewed as part of pre-operative protocol coverage. Given past medical history and time since last visit, based on ACC/AHA guidelines, Dylan [name] would be at acceptable risk for the planned procedure without further cardiovascular testing.   His aspirin may be held for 5 to 7 days prior to his procedure.  Please resume as soon as hemostasis is achieved.  I will route this recommendation to the requesting party via Epic fax function and remove from pre-op pool.  Please call with questions.  Jossie Ng. Matthe Sloane NP-C    02/17/2020, 9:51 AM [address] Office [phone] Fax [phone]

## 2020-02-17 NOTE — Telephone Encounter (Signed)
Pt low risk for cardiac event with procedure  OK to hold aspirin

## 2020-02-17 NOTE — Telephone Encounter (Signed)
Dylan [name] 69 year old male is requesting a left knee excision saphenous nerve branch neurotomy.  He was last seen by you on 01/14/2020.  During that time he denied chest pain.  His blood pressure was elevated.  He was working with his PCP on his blood pressure and was referred to the hypertension clinic.  His PMH includes coronary artery disease with false positive stress test 2019, hypertension, OSA on CPAP, HLD, DJD, and history of malignant melanoma.   May his aspirin be held prior to his procedure?  Please direct response to [facility] preop pool.  Thank you for your help.  Jossie Ng. Kadedra Vanaken NP-C    02/17/2020, 9:09 AM [address] Office [phone] Fax [phone]

## 2020-02-23 ENCOUNTER — Ambulatory Visit: Payer: Medicare HMO

## 2020-02-28 ENCOUNTER — Encounter: Payer: Self-pay | Admitting: Family Medicine

## 2020-02-28 ENCOUNTER — Ambulatory Visit: Payer: Medicare HMO

## 2020-02-28 NOTE — Progress Notes (Unsigned)
Patient ID: Dylan [name]                 DOB: [birth year]                      MRN: [id number]     HPI: Dylan [name] is a 70 y.o. male referred by Dr. Tenny Craw to HTN clinic. PMH is significant for hypertension, hyperlipidemia, diabetes, degenerative joint disease, GERD, sleep apnea on CPAP, and mild CAD.   Patient was seen by Dr. Tenny Craw on 01/14/2020 for high blood pressure. Patient endorsed chronic pain from back and knee replacement which may be contributing to his higher blood pressure.  - ask about previous meds tried - irbesartan 75 or 150? - consider inc HCTZ, start carvedilol  Current HTN meds: amlodipine 10 mg daily, hydrochlorothiazide 12.5 mg daily, irbesartan 75 mg daily Previously tried: losartan 50 mg daily, valsartan 160 mg daily BP goal: <130/80 mmHg  Family History: diabetes (father)  Social History: never smoker, denies alcohol or drug use  Diet:   Exercise:   Home BP readings:   Wt Readings from Last 3 Encounters:  01/31/20 228 lb 6.4 oz (103.6 kg)  01/14/20 233 lb 12.8 oz (106.1 kg)  10/04/19 224 lb (101.6 kg)   BP Readings from Last 3 Encounters:  02/01/20 (!) 145/78  01/14/20 (!) 152/88  07/07/19 139/72   Pulse Readings from Last 3 Encounters:  02/01/20 74  01/14/20 97  07/07/19 (!) 101    Renal function: CrCl cannot be calculated (Patient's most recent lab result is older than the maximum 21 days allowed.).  Past Medical History:  Diagnosis Date  . Allergy    allergic rhinitis  . Anxiety   . Arthritis    R knee, hands , back oa  . BPH (benign prostatic hyperplasia)   . Cancer (HCC)    melonoma- on face, treated with excision   . Complication of anesthesia    trouble waking up with succycholine, does not have enzyme to break down succycholine  . Coronary artery disease    nonobstructive  . Dizziness    MRI brain unremarkable 02/05/2015. resolved as of 01-27-2020  . GERD (gastroesophageal reflux disease)   . Hepatitis age 38   history of  infections Hepatitis type a  . History of melanoma 15 yrs ago   face and forehead-- [place] in [place]  . History of migraine    none recent as of 01-27-2020  . History of ulcerative colitis    non recent flares  . Hyperlipidemia   . Hypertension   . Sleep apnea    last sleep study 10 yrs ago,CPAP-  no current cpap use     Current Outpatient Medications on File Prior to Visit  Medication Sig Dispense Refill  . acetaminophen (TYLENOL) 650 MG CR tablet Take 1,300 mg by mouth every 8 (eight) hours as needed for pain.    Marland Kitchen amLODipine (NORVASC) 10 MG tablet TAKE 1 TABLET BY MOUTH EVERY DAY 90 tablet 3  . aspirin EC 81 MG tablet Take 1 tablet (81 mg total) by mouth daily.    Marland Kitchen co-enzyme Q-10 30 MG capsule Take 30 mg by mouth daily.    Marland Kitchen dutasteride (AVODART) 0.5 MG capsule TAKE 1 CAPSULE BY MOUTH EVERY DAY (Patient taking differently: Take 0.5 mg by mouth daily. ) 90 capsule 1  . esomeprazole (NEXIUM) 40 MG capsule TAKE 1 CAPSULE BY MOUTH EVERY DAY BEFORE BREAKFAST 90 capsule  1  . fluticasone (FLONASE) 50 MCG/ACT nasal spray Place 2 sprays into both nostrils daily. (Patient taking differently: Place 2 sprays into both nostrils as needed. ) 16 g 6  . hydrochlorothiazide (MICROZIDE) 12.5 MG capsule Take 1 capsule (12.5 mg total) by mouth daily. 90 capsule 3  . irbesartan (AVAPRO) 75 MG tablet TAKE 1 TABLET BY MOUTH EVERY DAY (Patient taking differently: Take 150 mg by mouth daily. ) 90 tablet 1  . Melatonin 5 MG TABS Take 1 tablet by mouth at bedtime.     Marland Kitchen neomycin-polymyxin-hydrocortisone (CORTISPORIN) 3.5-10000-1 OTIC suspension Place 4 drops into the left ear 3 (three) times daily. (Patient taking differently: Place 4 drops into the left ear as needed. ) 10 mL 0  . oxyCODONE-acetaminophen (PERCOCET) 10-325 MG tablet Take 1 tablet by mouth 4 (four) times daily.    . rosuvastatin (CRESTOR) 10 MG tablet TAKE 1 TABLET BY MOUTH EVERY DAY 90 tablet 1  . tamsulosin (FLOMAX) 0.4  MG CAPS capsule Take 0.4 mg by mouth daily.     Marland Kitchen zolpidem (AMBIEN) 10 MG tablet TAKE 1 TABLET BY MOUTH EVERYDAY AT BEDTIME 30 tablet 2   No current facility-administered medications on file prior to visit.    Allergies  Allergen Reactions  . Heparin Anaphylaxis    REACTION: breathing problems  . Succinylcholine Chloride Other (See Comments)    REACTION: does not wake up from anesthetic, pt. Reports that through testing that he was found  not to have the enzyme to breakdown the Succ.  . Vancomycin Hives and Other (See Comments)    Ran off a pump. So developed redman's . Running too fast  . Penicillins Rash    Did it involve swelling of the face/tongue/throat, SOB, or low BP? No Did it involve sudden or severe rash/hives, skin peeling, or any reaction on the inside of your mouth or nose? No Did you need to seek medical attention at a hospital or doctor's office? No When did it last happen?childhood If all above answers are "NO", may proceed with cephalosporin use.     Assessment/Plan:  1. Hypertension -   Kinnie Feil, PharmD PGY1 Acute Care Pharmacy Resident 02/28/2020 8:30 AM  Please check [email or web address] for unit specific pharmacy phone numbers.

## 2020-03-02 DIAGNOSIS — M5416 Radiculopathy, lumbar region: Secondary | ICD-10-CM | POA: Diagnosis not present

## 2020-03-07 ENCOUNTER — Other Ambulatory Visit: Payer: Self-pay

## 2020-03-07 ENCOUNTER — Encounter: Payer: Self-pay | Admitting: Pharmacist

## 2020-03-07 ENCOUNTER — Ambulatory Visit (INDEPENDENT_AMBULATORY_CARE_PROVIDER_SITE_OTHER): Payer: Medicare HMO | Admitting: Pharmacist

## 2020-03-07 VITALS — BP 148/86 | HR 89

## 2020-03-07 DIAGNOSIS — I10 Essential (primary) hypertension: Secondary | ICD-10-CM | POA: Diagnosis not present

## 2020-03-07 MED ORDER — HYDROCHLOROTHIAZIDE 25 MG PO TABS: 25.0000 mg | ORAL_TABLET | Freq: Every day | ORAL | 3 refills | Status: DC

## 2020-03-07 NOTE — Progress Notes (Signed)
Patient ID: Dylan [name]                 DOB: 21-Aug-1950                      MRN: [id number]     HPI: Dylan [name] is a 70 y.o. male referred by Dr. Harrington Challenger to HTN clinic. PMH is significant for HTN, DM, OSA, BPH, chronic pain, and HLD.  BP has been elevated in past year since wife passed away in 06-26-22.  PCP increased irbesartan to 150mg  daily however hypertension is complicated by chronic pain.  Is scheduled for knee surgery on 03/14/19.  At last visit with Dr Harrington Challenger, HCTZ was intiated at 12.5mg  once daily.    Patient presents today in good spirits. Has had chronic pain in knee and back. Takes scheduled Percocet 10/325 and ibuprofen PRN.  Reports uses ibuprofen up to 400mg  every 4 hours on days when he needs it.   Received a back injection in December at Ortho but is not sure what he received.  Is scheduled for knee procedure next week.  Reports medication compliance and had had no adverse effects to increase in irbesartan or since starting HCTZ.  Late wife and daughters all are nurses so he is aware to limit salt in his diet.  Has not been as physically active as he would like which he thinks is causing an increase in his BP as well. However is going to play golf this Thursday.  Current HTN meds: HCTZ 12.5mg  daily, amlodipine 10mg , irbesartan 150mg  daily Previously tried: valsartan 160, losartan 50 BP goal: <130/80  Family History: grandfather: HTN, MI, father: DM,   Social History: no tobacco, no Etoh   Diet: watches salt content.    Exercise: plays golf 2-3x a week unless limited by pain, yoga stretches, has a stationary bike  Home BP readings: n/a  Wt Readings from Last 3 Encounters:  01/31/20 228 lb 6.4 oz (103.6 kg)  01/14/20 233 lb 12.8 oz (106.1 kg)  10/04/19 224 lb (101.6 kg)   BP Readings from Last 3 Encounters:  02/01/20 (!) 145/78  01/14/20 (!) 152/88  07/07/19 139/72   Pulse Readings from Last 3 Encounters:  02/01/20 74  01/14/20 97  07/07/19 (!) 101    Renal  function: CrCl cannot be calculated (Patient's most recent lab result is older than the maximum 21 days allowed.).  Past Medical History:  Diagnosis Date  . Allergy    allergic rhinitis  . Anxiety   . Arthritis    R knee, hands , back oa  . BPH (benign prostatic hyperplasia)   . Cancer ([place])    melonoma- on face, treated with excision   . Complication of anesthesia    trouble waking up with succycholine, does not have enzyme to break down succycholine  . Coronary artery disease    nonobstructive  . Dizziness    MRI brain unremarkable 02/05/2015. resolved as of 01-27-2020  . GERD (gastroesophageal reflux disease)   . Hepatitis age 79   history of infections Hepatitis type a  . History of melanoma 15 yrs ago   face and forehead-- previously followed by Derm in [place]  . History of migraine    none recent as of 01-27-2020  . History of ulcerative colitis    non recent flares  . Hyperlipidemia   . Hypertension   . Sleep apnea    last sleep study 10 yrs ago,CPAP-  no current  cpap use     Current Outpatient Medications on File Prior to Visit  Medication Sig Dispense Refill  . acetaminophen (TYLENOL) 650 MG CR tablet Take 1,300 mg by mouth daily as needed for pain.    Marland Kitchen amLODipine (NORVASC) 10 MG tablet TAKE 1 TABLET BY MOUTH EVERY DAY (Patient taking differently: Take 10 mg by mouth daily.) 90 tablet 3  . aspirin EC 81 MG tablet Take 1 tablet (81 mg total) by mouth daily.    . cetirizine (ZYRTEC) 10 MG tablet Take 10 mg by mouth daily as needed for allergies.    . Coenzyme Q10 (COQ10) 200 MG CAPS Take 200 mg by mouth daily.    Marland Kitchen dutasteride (AVODART) 0.5 MG capsule TAKE 1 CAPSULE BY MOUTH EVERY DAY (Patient taking differently: Take 0.5 mg by mouth daily.) 90 capsule 1  . esomeprazole (NEXIUM) 40 MG capsule TAKE 1 CAPSULE BY MOUTH EVERY DAY BEFORE BREAKFAST (Patient taking differently: Take 40 mg by mouth daily.) 90 capsule 1  . fluticasone (FLONASE) 50 MCG/ACT nasal spray Place 2  sprays into both nostrils daily. (Patient taking differently: Place 2 sprays into both nostrils daily as needed for allergies.) 16 g 6  . hydrochlorothiazide (MICROZIDE) 12.5 MG capsule Take 1 capsule (12.5 mg total) by mouth daily. 90 capsule 3  . ibuprofen (ADVIL) 200 MG tablet Take 400 mg by mouth 3 (three) times daily as needed for moderate pain.    Marland Kitchen irbesartan (AVAPRO) 150 MG tablet Take 150 mg by mouth daily.    . irbesartan (AVAPRO) 75 MG tablet TAKE 1 TABLET BY MOUTH EVERY DAY (Patient not taking: Reported on 03/01/2020) 90 tablet 1  . Melatonin 5 MG TABS Take 10 mg by mouth at bedtime as needed (sleep).    . mupirocin ointment (BACTROBAN) 2 % Apply 1 application topically daily as needed (skin bumps).    Marland Kitchen oxyCODONE-acetaminophen (PERCOCET) 10-325 MG tablet Take 1 tablet by mouth 4 (four) times daily.    . rosuvastatin (CRESTOR) 10 MG tablet TAKE 1 TABLET BY MOUTH EVERY DAY (Patient taking differently: Take 10 mg by mouth daily.) 90 tablet 1  . tamsulosin (FLOMAX) 0.4 MG CAPS capsule Take 0.4 mg by mouth daily.     Marland Kitchen zolpidem (AMBIEN) 10 MG tablet TAKE 1 TABLET BY MOUTH EVERYDAY AT BEDTIME (Patient taking differently: Take 10 mg by mouth at bedtime.) 30 tablet 2   No current facility-administered medications on file prior to visit.    Allergies  Allergen Reactions  . Heparin Anaphylaxis    breathing problems  . Succinylcholine Chloride Other (See Comments)    does not wake up from anesthetic, pt. Reports that through testing that he was found  not to have the enzyme to breakdown the Succ.  . Vancomycin Hives and Other (See Comments)    Ran off a pump. So developed redman's . Running too fast  . Penicillins Rash    Did it involve swelling of the face/tongue/throat, SOB, or low BP? No Did it involve sudden or severe rash/hives, skin peeling, or any reaction on the inside of your mouth or nose? No Did you need to seek medical attention at a hospital or doctor's office? No When did it  last happen?childhood If all above answers are "NO", may proceed with cephalosporin use.     Assessment/Plan:  1. Hypertension - BP in room today 148/86 which is above goal of <130/80.  Possibly contributory are pain and inactivity.    Recommended he limit ibuprofen use  as much as he can since this may also be increasing BP.  Recommended continuing to watch salt in his diet and increase exercise/golf as much as tolerated.    Recommended beginning checking his BP once daily and if he feels like it may be elevated.  Since patient is tolerating HCTZ, will increase at this time and recheck in about a month.  Patient voiced understanding.  CONTINUE amlodipine 10mg  daily CONTINUE irbesartan 150mg  daily START HCTZ 25mg  once daily CHECK BMP in 2 weeks RECHECK in clinic in 4 weeks  Karren Cobble, PharmD, BCACP, [place], [place]  [id number], [place], [place] [address] Phone: [phone]; Fax: [phone] 03/07/2020 9:47 AM

## 2020-03-07 NOTE — Patient Instructions (Signed)
It was nice meeting you today!  We would like to have your blood pressure be less than 130/80  Please start checking your blood pressure at home and keep a log and watch your salt content  Continue your amlodipine 10mg  daily and your irbesartan 150mg  daily  We will increase your hydrochlorothiazide to 25 mg once daily in the morning  We will check your lab work in about 2 weeks  Please call with any questions!  Karren Cobble, PharmD, BCACP, [place], [place] [address], [place], [place] [address] Phone: [phone]; Fax: [phone] 03/07/2020 9:28 AM

## 2020-03-07 NOTE — Progress Notes (Signed)
DUE TO COVID-19 ONLY ONE VISITOR IS ALLOWED TO COME WITH YOU AND STAY IN THE WAITING ROOM ONLY DURING PRE OP AND PROCEDURE DAY OF SURGERY. THE 1 VISITOR  MAY VISIT WITH YOU AFTER SURGERY IN YOUR PRIVATE ROOM DURING VISITING HOURS ONLY!  YOU NEED TO HAVE A COVID 19 TEST ON__1/13/2022 _____ @_______ , THIS TEST MUST BE DONE BEFORE SURGERY,  COVID TESTING SITE [address] [place] [address], IT IS ON THE RIGHT GOING OUT [address] APPROXIMATELY  2 MINUTES PAST [employer] ON THE RIGHT. ONCE YOUR COVID TEST IS COMPLETED,  PLEASE BEGIN THE QUARANTINE INSTRUCTIONS AS OUTLINED IN YOUR HANDOUT.                Dylan [name]  03/07/2020   Your procedure is scheduled on:  03/13/2020   Report to [facility] Main  Entrance   Report to admitting at 0530   AM     Call this number if you have problems the morning of surgery [phone]    REMEMBER: NO  SOLID FOOD CANDY OR GUM AFTER MIDNIGHT. CLEAR LIQUIDS UNTIL  0430       . NOTHING BY MOUTH EXCEPT CLEAR LIQUIDS UNTIL    . PLEASE FINISH ENSURE DRINK PER SURGEON ORDER  WHICH NEEDS TO BE COMPLETED AT    0430  .      CLEAR LIQUID DIET   Foods Allowed                                                                    Coffee and tea, regular and decaf                            Fruit ices (not with fruit pulp)                                      Iced Popsicles                                    Carbonated beverages, regular and diet                                    Cranberry, grape and apple juices Sports drinks like Gatorade Lightly seasoned clear broth or consume(fat free) Sugar, honey syrup ___________________________________________________________________      BRUSH YOUR TEETH MORNING OF SURGERY AND RINSE YOUR MOUTH OUT, NO CHEWING GUM CANDY OR MINTS.     Take these medicines the morning of surgery with A SIP OF WATER: AMLODPINE, nEXIUM   DO NOT TAKE ANY DIABETIC MEDICATIONS DAY OF YOUR SURGERY                                You may not have any metal on your body including hair pins and              piercings  Do not wear jewelry,  make-up, lotions, powders or perfumes, deodorant             Do not wear nail polish on your fingernails.  Do not shave  48 hours prior to surgery.              Men may shave face and neck.   Do not bring valuables to the hospital. [place].  Contacts, dentures or bridgework may not be worn into surgery.  Leave suitcase in the car. After surgery it may be brought to your room.     Patients discharged the day of surgery will not be allowed to drive home. IF YOU ARE HAVING SURGERY AND GOING HOME THE SAME DAY, YOU MUST HAVE AN ADULT TO DRIVE YOU HOME AND BE WITH YOU FOR 24 HOURS. YOU MAY GO HOME BY TAXI OR UBER OR ORTHERWISE, BUT AN ADULT MUST ACCOMPANY YOU HOME AND STAY WITH YOU FOR 24 HOURS.  Name and phone number of your driver:  Special Instructions: N/A              Please read over the following fact sheets you were given: _____________________________________________________________________  [facility] - Preparing for Surgery Before surgery, you can play an important role.  Because skin is not sterile, your skin needs to be as free of germs as possible.  You can reduce the number of germs on your skin by washing with CHG (chlorahexidine gluconate) soap before surgery.  CHG is an antiseptic cleaner which kills germs and bonds with the skin to continue killing germs even after washing. Please DO NOT use if you have an allergy to CHG or antibacterial soaps.  If your skin becomes reddened/irritated stop using the CHG and inform your nurse when you arrive at Short Stay. Do not shave (including legs and underarms) for at least 48 hours prior to the first CHG shower.  You may shave your face/neck. Please follow these instructions carefully:  1.  Shower with CHG Soap the night before surgery and the  morning of  Surgery.  2.  If you choose to wash your hair, wash your hair first as usual with your  normal  shampoo.  3.  After you shampoo, rinse your hair and body thoroughly to remove the  shampoo.                           4.  Use CHG as you would any other liquid soap.  You can apply chg directly  to the skin and wash                       Gently with a scrungie or clean washcloth.  5.  Apply the CHG Soap to your body ONLY FROM THE NECK DOWN.   Do not use on face/ open                           Wound or open sores. Avoid contact with eyes, ears mouth and genitals (private parts).                       Wash face,  Genitals (private parts) with your normal soap.             6.  Wash thoroughly, paying special attention  to the area where your surgery  will be performed.  7.  Thoroughly rinse your body with warm water from the neck down.  8.  DO NOT shower/wash with your normal soap after using and rinsing off  the CHG Soap.                9.  Pat yourself dry with a clean towel.            10.  Wear clean pajamas.            11.  Place clean sheets on your bed the night of your first shower and do not  sleep with pets. Day of Surgery : Do not apply any lotions/deodorants the morning of surgery.  Please wear clean clothes to the hospital/surgery center.  FAILURE TO FOLLOW THESE INSTRUCTIONS MAY RESULT IN THE CANCELLATION OF YOUR SURGERY PATIENT SIGNATURE_________________________________  NURSE SIGNATURE__________________________________  ________________________________________________________________________

## 2020-03-08 ENCOUNTER — Encounter (HOSPITAL_COMMUNITY)
Admission: RE | Admit: 2020-03-08 | Discharge: 2020-03-08 | Disposition: A | Discharge status: Home / Self Care | Payer: Medicare HMO | Source: Ambulatory Visit | Attending: Orthopedic Surgery | Admitting: Orthopedic Surgery

## 2020-03-08 ENCOUNTER — Encounter (HOSPITAL_COMMUNITY): Payer: Self-pay

## 2020-03-08 ENCOUNTER — Other Ambulatory Visit: Payer: Self-pay

## 2020-03-08 DIAGNOSIS — Z01812 Encounter for preprocedural laboratory examination: Secondary | ICD-10-CM | POA: Diagnosis not present

## 2020-03-08 LAB — CBC
HCT: 41.8 % (ref 39.0–52.0)
Hemoglobin: 14.1 g/dL (ref 13.0–17.0)
MCH: 30.1 pg (ref 26.0–34.0)
MCHC: 33.7 g/dL (ref 30.0–36.0)
MCV: 89.1 fL (ref 80.0–100.0)
Platelets: 306 10*3/uL (ref 150–400)
RBC: 4.69 MIL/uL (ref 4.22–5.81)
RDW: 12.4 % (ref 11.5–15.5)
WBC: 6.9 10*3/uL (ref 4.0–10.5)
nRBC: 0 % (ref 0.0–0.2)

## 2020-03-08 LAB — BASIC METABOLIC PANEL
Anion gap: 9 (ref 5–15)
BUN: 21 mg/dL (ref 8–23)
CO2: 27 mmol/L (ref 22–32)
Calcium: 9.6 mg/dL (ref 8.9–10.3)
Chloride: 102 mmol/L (ref 98–111)
Creatinine, Ser: 0.93 mg/dL (ref 0.61–1.24)
GFR, Estimated: >60 mL/min (ref >60)
Glucose, Bld: 133 mg/dL — ABNORMAL HIGH (ref 70–99)
Potassium: 4.5 mmol/L (ref 3.5–5.1)
Sodium: 138 mmol/L (ref 135–145)

## 2020-03-08 LAB — SURGICAL PCR SCREEN
MRSA, PCR: POSITIVE — AB
Staphylococcus aureus: POSITIVE — AB

## 2020-03-08 NOTE — Progress Notes (Addendum)
Anesthesia Review:  PCP: dr lAZOFF [facility]  Cardiologist : dr Nevin Bloodgood [name]  Chest x-ray :04/14/2019  EKG :04/15/2019  Echo : Stress test: Cardiac Cath : 2019  Activity level: can do a flight of stairs without difficulty  Sleep Study/ CPAP : Fasting Blood Sugar :      / Checks Blood Sugar -- times a day:   Blood Thinner/ Instructions /Last Dose: ASA / Instructions/ Last Dose :  See Anesthesia History - does not have enzyme to break down succycholine per pt pt states was tested  pcr pos for staph and mrsa - routed to DR Alvan Dame.

## 2020-03-09 ENCOUNTER — Other Ambulatory Visit (HOSPITAL_COMMUNITY): Payer: Medicare HMO

## 2020-03-13 LAB — TYPE AND SCREEN
ABO/RH(D): A POS
Antibody Screen: NEGATIVE

## 2020-03-20 ENCOUNTER — Other Ambulatory Visit: Payer: Medicare HMO | Admitting: *Deleted

## 2020-03-20 ENCOUNTER — Other Ambulatory Visit: Payer: Self-pay

## 2020-03-20 DIAGNOSIS — I10 Essential (primary) hypertension: Secondary | ICD-10-CM

## 2020-03-20 LAB — BASIC METABOLIC PANEL
BUN/Creatinine Ratio: 16 (ref 10–24)
BUN: 14 mg/dL (ref 8–27)
CO2: 23 mmol/L (ref 20–29)
Calcium: 9.1 mg/dL (ref 8.6–10.2)
Chloride: 96 mmol/L (ref 96–106)
Creatinine, Ser: 0.85 mg/dL (ref 0.76–1.27)
GFR calc Af Amer: 103 mL/min/{1.73_m2} (ref >59)
GFR calc non Af Amer: 90 mL/min/{1.73_m2} (ref >59)
Glucose: 179 mg/dL — ABNORMAL HIGH (ref 65–99)
Potassium: 4.3 mmol/L (ref 3.5–5.2)
Sodium: 134 mmol/L (ref 134–144)

## 2020-03-21 ENCOUNTER — Other Ambulatory Visit: Payer: Medicare HMO

## 2020-03-21 NOTE — Patient Instructions (Addendum)
DUE TO COVID-19 ONLY ONE VISITOR IS ALLOWED TO COME WITH YOU AND STAY IN THE WAITING ROOM ONLY DURING PRE OP AND PROCEDURE DAY OF SURGERY. THE 1 VISITOR  MAY VISIT WITH YOU AFTER SURGERY IN YOUR PRIVATE ROOM DURING VISITING HOURS ONLY!  YOU NEED TO HAVE A COVID 19 TEST ON: 03/27/20 @  8:50 AM  , THIS TEST MUST BE DONE BEFORE SURGERY,  COVID TESTING SITE [place] [address], IT IS ON THE RIGHT GOING OUT [address] APPROXIMATELY  2 MINUTES PAST [employer] ON THE RIGHT. ONCE YOUR COVID TEST IS COMPLETED,  PLEASE BEGIN THE QUARANTINE INSTRUCTIONS AS OUTLINED IN YOUR HANDOUT.                Dylan [name]    Your procedure is scheduled on: 03/30/20   Report to [facility] Main  Entrance   Report to admitting at: 7:30 AM     Call this number if you have problems the morning of surgery [phone]   NO SOLID FOOD AFTER MIDNIGHT THE NIGHT PRIOR TO SURGERY. NOTHING BY MOUTH EXCEPT CLEAR LIQUIDS UNTIL: 7:00 AM . PLEASE FINISH ENSURE DRINK PER SURGEON ORDER  WHICH NEEDS TO BE COMPLETED AT: 7:00 AM .  CLEAR LIQUID DIET  Foods Allowed                                                                     Foods Excluded  Coffee and tea, regular and decaf                             liquids that you cannot  Plain Jell-O any favor except red or purple                                           see through such as: Fruit ices (not with fruit pulp)                                     milk, soups, orange juice  Iced Popsicles                                    All solid food Carbonated beverages, regular and diet                                    Cranberry, grape and apple juices Sports drinks like Gatorade Lightly seasoned clear broth or consume(fat free) Sugar, honey syrup  Sample Menu Breakfast                                Lunch  Supper Cranberry juice                    Beef broth                            Chicken  broth Jell-O                                     Grape juice                           Apple juice Coffee or tea                        Jell-O                                      Popsicle                                                Coffee or tea                        Coffee or tea  _____________________________________________________________________   BRUSH YOUR TEETH MORNING OF SURGERY AND RINSE YOUR MOUTH OUT, NO CHEWING GUM CANDY OR MINTS.    Take these medicines the morning of surgery with A SIP OF WATER: amlodipine,esomeprazole,dutasteride,tamsulosin. Flonase and cetirizine as needed.                               You may not have any metal on your body including hair pins and              piercings  Do not wear jewelry, lotions, powders or perfumes, deodoran             Men may shave face and neck.   Do not bring valuables to the hospital. [place].  Contacts, dentures or bridgework may not be worn into surgery.  Leave suitcase in the car. After surgery it may be brought to your room.     Patients discharged the day of surgery will not be allowed to drive home. IF YOU ARE HAVING SURGERY AND GOING HOME THE SAME DAY, YOU MUST HAVE AN ADULT TO DRIVE YOU HOME AND BE WITH YOU FOR 24 HOURS. YOU MAY GO HOME BY TAXI OR UBER OR ORTHERWISE, BUT AN ADULT MUST ACCOMPANY YOU HOME AND STAY WITH YOU FOR 24 HOURS.  Name and phone number of your driver:  Special Instructions: N/A              Please read over the following fact sheets you were given: _____________________________________________________________________         [facility] - Preparing for Surgery Before surgery, you can play an important role.  Because skin is not sterile, your skin needs to be as free of germs as possible.  You can reduce the number of germs on your skin by  washing with CHG (chlorahexidine gluconate) soap before surgery.  CHG is an antiseptic cleaner which  kills germs and bonds with the skin to continue killing germs even after washing. Please DO NOT use if you have an allergy to CHG or antibacterial soaps.  If your skin becomes reddened/irritated stop using the CHG and inform your nurse when you arrive at Short Stay. Do not shave (including legs and underarms) for at least 48 hours prior to the first CHG shower.  You may shave your face/neck. Please follow these instructions carefully:  1.  Shower with CHG Soap the night before surgery and the  morning of Surgery.  2.  If you choose to wash your hair, wash your hair first as usual with your  normal  shampoo.  3.  After you shampoo, rinse your hair and body thoroughly to remove the  shampoo.                           4.  Use CHG as you would any other liquid soap.  You can apply chg directly  to the skin and wash                       Gently with a scrungie or clean washcloth.  5.  Apply the CHG Soap to your body ONLY FROM THE NECK DOWN.   Do not use on face/ open                           Wound or open sores. Avoid contact with eyes, ears mouth and genitals (private parts).                       Wash face,  Genitals (private parts) with your normal soap.             6.  Wash thoroughly, paying special attention to the area where your surgery  will be performed.  7.  Thoroughly rinse your body with warm water from the neck down.  8.  DO NOT shower/wash with your normal soap after using and rinsing off  the CHG Soap.                9.  Pat yourself dry with a clean towel.            10.  Wear clean pajamas.            11.  Place clean sheets on your bed the night of your first shower and do not  sleep with pets. Day of Surgery : Do not apply any lotions/deodorants the morning of surgery.  Please wear clean clothes to the hospital/surgery center.  FAILURE TO FOLLOW THESE INSTRUCTIONS MAY RESULT IN THE CANCELLATION OF YOUR SURGERY PATIENT SIGNATURE_________________________________  NURSE  SIGNATURE__________________________________  ________________________________________________________________________   Adam Phenix  An incentive spirometer is a tool that can help keep your lungs clear and active. This tool measures how well you are filling your lungs with each breath. Taking long deep breaths may help reverse or decrease the chance of developing breathing (pulmonary) problems (especially infection) following:  A long period of time when you are unable to move or be active. BEFORE THE PROCEDURE   If the spirometer includes an indicator to show your best effort, your nurse or respiratory therapist will set it to a desired goal.  If possible, sit up  straight or lean slightly forward. Try not to slouch.  Hold the incentive spirometer in an upright position. INSTRUCTIONS FOR USE  1. Sit on the edge of your bed if possible, or sit up as far as you can in bed or on a chair. 2. Hold the incentive spirometer in an upright position. 3. Breathe out normally. 4. Place the mouthpiece in your mouth and seal your lips tightly around it. 5. Breathe in slowly and as deeply as possible, raising the piston or the ball toward the top of the column. 6. Hold your breath for 3-5 seconds or for as long as possible. Allow the piston or ball to fall to the bottom of the column. 7. Remove the mouthpiece from your mouth and breathe out normally. 8. Rest for a few seconds and repeat Steps 1 through 7 at least 10 times every 1-2 hours when you are awake. Take your time and take a few normal breaths between deep breaths. 9. The spirometer may include an indicator to show your best effort. Use the indicator as a goal to work toward during each repetition. 10. After each set of 10 deep breaths, practice coughing to be sure your lungs are clear. If you have an incision (the cut made at the time of surgery), support your incision when coughing by placing a pillow or rolled up towels firmly  against it. Once you are able to get out of bed, walk around indoors and cough well. You may stop using the incentive spirometer when instructed by your caregiver.  RISKS AND COMPLICATIONS  Take your time so you do not get dizzy or light-headed.  If you are in pain, you may need to take or ask for pain medication before doing incentive spirometry. It is harder to take a deep breath if you are having pain. AFTER USE  Rest and breathe slowly and easily.  It can be helpful to keep track of a log of your progress. Your caregiver can provide you with a simple table to help with this. If you are using the spirometer at home, follow these instructions: [place] IF:   You are having difficultly using the spirometer.  You have trouble using the spirometer as often as instructed.  Your pain medication is not giving enough relief while using the spirometer.  You develop fever of 100.5 F (38.1 C) or higher. SEEK IMMEDIATE MEDICAL CARE IF:   You cough up bloody sputum that had not been present before.  You develop fever of 102 F (38.9 C) or greater.  You develop worsening pain at or near the incision site. MAKE SURE YOU:   Understand these instructions.  Will watch your condition.  Will get help right away if you are not doing well or get worse. Document Released: 06/24/2006 Document Revised: 05/06/2011 Document Reviewed: 08/25/2006 [facility] Patient Information 2014 [place], [place].   ________________________________________________________________________

## 2020-03-23 ENCOUNTER — Other Ambulatory Visit: Payer: Self-pay

## 2020-03-23 ENCOUNTER — Encounter (HOSPITAL_COMMUNITY): Payer: Self-pay

## 2020-03-23 ENCOUNTER — Encounter (HOSPITAL_COMMUNITY)
Admission: RE | Admit: 2020-03-23 | Discharge: 2020-03-23 | Disposition: A | Discharge status: Home / Self Care | Payer: Medicare HMO | Source: Ambulatory Visit | Attending: Orthopedic Surgery | Admitting: Orthopedic Surgery

## 2020-03-23 NOTE — Progress Notes (Signed)
COVID Vaccine Completed: Yes Date COVID Vaccine completed: 03/30/20 COVID vaccine manufacturer: [place]      PCP - Irene Pap: DO Cardiologist - Dr. Dorris Carnes. LOV: 03/07/20  Chest x-ray - 04/14/19 EKG - 04/15/19 Stress Test -  ECHO -  Cardiac Cath - 2019 Pacemaker/ICD device last checked:  Sleep Study -  CPAP -   Fasting Blood Sugar -  Checks Blood Sugar _____ times a day  Blood Thinner Instructions: Aspirin Instructions: Last Dose:  Anesthesia review:   Patient denies shortness of breath, fever, cough and chest pain at PAT appointment   Patient verbalized understanding of instructions that were given to them at the PAT appointment. Patient was also instructed that they will need to review over the PAT instructions again at home before surgery.

## 2020-03-27 ENCOUNTER — Other Ambulatory Visit (HOSPITAL_COMMUNITY): Payer: Medicare HMO

## 2020-03-29 ENCOUNTER — Other Ambulatory Visit: Payer: Self-pay

## 2020-03-29 ENCOUNTER — Ambulatory Visit (INDEPENDENT_AMBULATORY_CARE_PROVIDER_SITE_OTHER): Payer: Medicare HMO | Admitting: Pharmacist

## 2020-03-29 VITALS — BP 134/84 | HR 91

## 2020-03-29 DIAGNOSIS — I10 Essential (primary) hypertension: Secondary | ICD-10-CM

## 2020-03-29 NOTE — Patient Instructions (Addendum)
It was good seeing you again!  Your blood pressure goal is less than 130/80  Continue your amlodipine 10 mg daily, hydrochlorothiazide 25 mg daily  We will increase the irbesartan to 225 mg (1 and 1/2 tablets) once a day  Continue to monitor your blood pressure at home  Reduce the amount of salt in your diet and try to reduce your carbohydrates.    Try to be as physically active as possible without pain  Please call with any questions and we will see you in 4 weeks  Karren Cobble, PharmD, BCACP, [place], [place] [address] [place], [place] [address] Phone: [phone]; Fax: [phone] 03/29/2020 9:03 AM

## 2020-03-29 NOTE — Progress Notes (Signed)
Patient ID: Dylan [name]                 DOB: November 27, 1950                      MRN: [id number]     HPI: Dylan [name] is a 70 y.o. male referred by Dr. Harrington Challenger to HTN clinic. PMH is significant forHTN, DM, OSA, BPH, chronic pain, and HLD.  BP has been elevated in past year since wife passed away in 06-06-22.  PCP increased irbesartan to 150mg  daily however hypertension is complicated by chronic pain.  Is scheduled for knee surgery on 03/14/19.  At last visit with Dr Harrington Challenger, HCTZ was intiated at 12.5mg  once daily.    Patient presents today in good spirits. Has had chronic pain in knee and back. Takes scheduled Percocet 10/325 and ibuprofen PRN.  Reports uses ibuprofen up to 400mg  every 4 hours on days when he needs it although he has cut down significantly. Was originally scheduled for knee surgery this month but has now been pushed back to late March due to Covid.  Patient reports no adverse effects since increase in HCTZ and says he feels better.  Drinks 3-04 cups of black coffee per morning. Has not been as physically active as he likes due to pain and cold temperature but does plan to go golfing tomorrow.  Is trying to eliminate fat in his diet.  Does not monitor sodium intake.  Home BP readings: 1/30: 160/88 1/28: 134/78 1/27: 111/70 1/20: 155/90 1/19: 137/79 1/18: 162/88  Current HTN meds: HCTZ 25mg  daily, amlodipine 10mg , irbesartan 150mg  daily Previously tried: valsartan 160, losartan 50 BP goal: <130/80  Social History: caffiene, 3-4 cups per day  Wt Readings from Last 3 Encounters:  01/31/20 228 lb 6.4 oz (103.6 kg)  01/14/20 233 lb 12.8 oz (106.1 kg)  10/04/19 224 lb (101.6 kg)   BP Readings from Last 3 Encounters:  03/23/20 111/70  03/08/20 139/70  03/07/20 (!) 148/86   Pulse Readings from Last 3 Encounters:  03/23/20 99  03/08/20 92  03/07/20 89    Renal function: CrCl cannot be calculated (Unknown ideal weight.).  Past Medical History:  Diagnosis Date  . Allergy     allergic rhinitis  . Anxiety   . Arthritis    R knee, hands , back oa  . BPH (benign prostatic hyperplasia)   . Cancer ([place])    melonoma- on face, treated with excision   . Complication of anesthesia    trouble waking up with succycholine, does not have enzyme to break down succycholine  . Coronary artery disease    nonobstructive  . Dizziness    MRI brain unremarkable 02/05/2015. resolved as of 01-27-2020  . History of melanoma 15 yrs ago   face and forehead-- previously followed by Derm in [place]  . History of migraine    none recent as of 01-27-2020  . History of ulcerative colitis    non recent flares  . Hyperlipidemia   . Hypertension   . Sleep apnea    last sleep study 10 yrs ago,CPAP-  no current cpap use     Current Outpatient Medications on File Prior to Visit  Medication Sig Dispense Refill  . acetaminophen (TYLENOL) 650 MG CR tablet Take 1,300 mg by mouth daily as needed for pain.    Marland Kitchen amLODipine (NORVASC) 10 MG tablet TAKE 1 TABLET BY MOUTH EVERY DAY (Patient taking differently: Take 10 mg by  mouth daily.) 90 tablet 3  . aspirin EC 81 MG tablet Take 1 tablet (81 mg total) by mouth daily.    . cetirizine (ZYRTEC) 10 MG tablet Take 10 mg by mouth daily as needed for allergies.    . Coenzyme Q10 (COQ10) 200 MG CAPS Take 200 mg by mouth daily.    Marland Kitchen dutasteride (AVODART) 0.5 MG capsule TAKE 1 CAPSULE BY MOUTH EVERY DAY (Patient taking differently: Take 0.5 mg by mouth daily.) 90 capsule 1  . esomeprazole (NEXIUM) 40 MG capsule TAKE 1 CAPSULE BY MOUTH EVERY DAY BEFORE BREAKFAST (Patient taking differently: Take 40 mg by mouth daily.) 90 capsule 1  . fluticasone (FLONASE) 50 MCG/ACT nasal spray Place 2 sprays into both nostrils daily. (Patient taking differently: Place 2 sprays into both nostrils daily as needed for allergies.) 16 g 6  . hydrochlorothiazide (HYDRODIURIL) 25 MG tablet Take 1 tablet (25 mg total) by mouth daily. 90 tablet 3  . ibuprofen (ADVIL) 200 MG  tablet Take 400 mg by mouth 3 (three) times daily as needed for moderate pain.    Marland Kitchen irbesartan (AVAPRO) 150 MG tablet Take 150 mg by mouth daily.    . Melatonin 5 MG TABS Take 10 mg by mouth at bedtime as needed (sleep).    . mupirocin ointment (BACTROBAN) 2 % Apply 1 application topically daily as needed (skin bumps).    Marland Kitchen oxyCODONE-acetaminophen (PERCOCET) 10-325 MG tablet Take 1 tablet by mouth 4 (four) times daily.    . rosuvastatin (CRESTOR) 10 MG tablet TAKE 1 TABLET BY MOUTH EVERY DAY (Patient taking differently: Take 10 mg by mouth daily.) 90 tablet 1  . tamsulosin (FLOMAX) 0.4 MG CAPS capsule Take 0.4 mg by mouth daily.     Marland Kitchen zolpidem (AMBIEN) 10 MG tablet TAKE 1 TABLET BY MOUTH EVERYDAY AT BEDTIME (Patient taking differently: Take 10 mg by mouth at bedtime.) 30 tablet 2   No current facility-administered medications on file prior to visit.    Allergies  Allergen Reactions  . Heparin Anaphylaxis    breathing problems  . Succinylcholine Chloride Other (See Comments)    does not wake up from anesthetic, pt. Reports that through testing that he was found  not to have the enzyme to breakdown the Succ.  . Vancomycin Hives and Other (See Comments)    Ran off a pump. So developed redman's . Running too fast  . Penicillins Rash    Did it involve swelling of the face/tongue/throat, SOB, or low BP? No Did it involve sudden or severe rash/hives, skin peeling, or any reaction on the inside of your mouth or nose? No Did you need to seek medical attention at a hospital or doctor's office? No When did it last happen?childhood If all above answers are "NO", may proceed with cephalosporin use.     Assessment/Plan:  1. Hypertension - Patient BP in room 134/84 which is slightly above goal of <130/80.  Complicated by knee and back pain and also patient has had 3 cups of coffee this morning.    Counseled patient on reducing salt intake as well as carbohydrates since his blood sugar had  previously been elevated.  Also recommended he reduce his caffeine intake or switch to decaf or half/caff.  Patient willing to make changes.  Patient will need further BP control since surgery has now been pushed back until late March.  Will increase irbesartan to 1.5 tablets and recheck in 1 month.  Hopeful BP is better controlled post surgery and  his pain is relieved.   Continue amlodipine 10mg  daily Continue HCTZ 25mg  daily Increase irbesartan to 225mg  daily Recheck in 1 month.  Karren Cobble, PharmD, BCACP, [place], [place] [address], [place], [place] [address] Phone: [phone]; Fax: [phone] 03/29/2020 10:02 AM

## 2020-04-18 DIAGNOSIS — M5416 Radiculopathy, lumbar region: Secondary | ICD-10-CM | POA: Diagnosis not present

## 2020-04-26 ENCOUNTER — Encounter: Payer: Self-pay | Admitting: Pharmacist

## 2020-04-26 ENCOUNTER — Ambulatory Visit (INDEPENDENT_AMBULATORY_CARE_PROVIDER_SITE_OTHER): Payer: Medicare HMO | Admitting: Pharmacist

## 2020-04-26 ENCOUNTER — Other Ambulatory Visit: Payer: Self-pay

## 2020-04-26 VITALS — BP 122/58 | HR 87

## 2020-04-26 DIAGNOSIS — I10 Essential (primary) hypertension: Secondary | ICD-10-CM | POA: Diagnosis not present

## 2020-04-26 NOTE — Patient Instructions (Addendum)
It was good seeing you today!  We would like to keep your blood pressure lower than 130/80 so today's reading was at goal  Continue your amlodipine 10mg , hydrochlorothiazide 25mg , and your irbesartan 225mg  (1 and 1/2 tablets) once a day  Continue to watch your salt and caffeine intake  We recommend blood pressure cuffs made by [employer], starting with the [employer] 3  Please call with any questions  Karren Cobble, PharmD, BCACP, [place], [place] [address] [place], [place] [address] Phone: [phone]; Fax: [phone] 04/26/2020 9:51 AM

## 2020-04-26 NOTE — Progress Notes (Signed)
Patient ID: MAKAVELI HOARD                 DOB: [birth year]                      MRN: [id number]     HPI: Dylan [name] is a 70 y.o. male referred by Dr. Harrington Challenger to HTN clinic. PMH is significant for  significant for HTN,DM,OSA, BPH,chronic pain,and HLD.  BP has been elevated in past year since wife passed away in Jun 23, 2022. PCP increased irbesartan to 150mg  daily however hypertension is complicated by chronic pain. Is scheduled for knee surgery in March 2022 At last visit with Dr Harrington Challenger, HCTZ was intiated at 12.5mg  once daily and at last visit with PharmD, irbesartan was increased to 225mg  once daily and patient was advised to cut back on caffiene and salty foods.  Patient presents today in good spirits.  Has significantly cut back down on sodium intake. No longer adds salt, uses Mrs Deliah Boston.  Has cut down on coffee intake, was previously drinking ~4 cups a day, now down to ~2 cups per day.  Is still limited by back and knee pain which he believes has increased his blood pressure and pulse rate.  Has pre op lab work scheduled for 3/18.    Tests blood pressure daily but is not sure his machine is reading correctly. Uses a CVS cuff and thinks his BP results are lower at clinic than at home. Has not been validated by nursing staff or clinicians.   Current HTN meds: amlodipine 10mg  daily, irbesartan 225mg  daily, HCTZ 25mg  daily,  Previously tried: valsartan 160, losartan 50,  BP goal: <130/80  Home BP readings:   3/2: 137/84  98 3/1: 142/92  97 2/28: 141/87  96 2/27: 148/96  98 2/26:  138/83  90 2/25:  136/72  94  Wt Readings from Last 3 Encounters:  01/31/20 228 lb 6.4 oz (103.6 kg)  01/14/20 233 lb 12.8 oz (106.1 kg)  10/04/19 224 lb (101.6 kg)   BP Readings from Last 3 Encounters:  03/29/20 134/84  03/23/20 111/70  03/08/20 139/70   Pulse Readings from Last 3 Encounters:  03/29/20 91  03/23/20 99  03/08/20 92    Renal function: CrCl cannot be calculated (Patient's most recent  lab result is older than the maximum 21 days allowed.).  Past Medical History:  Diagnosis Date  . Allergy    allergic rhinitis  . Anxiety   . Arthritis    R knee, hands , back oa  . BPH (benign prostatic hyperplasia)   . Cancer ([place])    melonoma- on face, treated with excision   . Complication of anesthesia    trouble waking up with succycholine, does not have enzyme to break down succycholine  . Coronary artery disease    nonobstructive  . Dizziness    MRI brain unremarkable 02/05/2015. resolved as of 01-27-2020  . History of melanoma 15 yrs ago   face and forehead-- previously followed by Derm in [place]  . History of migraine    none recent as of 01-27-2020  . History of ulcerative colitis    non recent flares  . Hyperlipidemia   . Hypertension   . Sleep apnea    last sleep study 10 yrs ago,CPAP-  no current cpap use     Current Outpatient Medications on File Prior to Visit  Medication Sig Dispense Refill  . acetaminophen (TYLENOL) 650 MG CR tablet Take 1,300  mg by mouth daily as needed for pain.    Marland Kitchen amLODipine (NORVASC) 10 MG tablet TAKE 1 TABLET BY MOUTH EVERY DAY (Patient taking differently: Take 10 mg by mouth daily.) 90 tablet 3  . aspirin EC 81 MG tablet Take 1 tablet (81 mg total) by mouth daily.    . cetirizine (ZYRTEC) 10 MG tablet Take 10 mg by mouth daily as needed for allergies.    . Coenzyme Q10 (COQ10) 200 MG CAPS Take 200 mg by mouth daily.    Marland Kitchen dutasteride (AVODART) 0.5 MG capsule TAKE 1 CAPSULE BY MOUTH EVERY DAY (Patient taking differently: Take 0.5 mg by mouth daily.) 90 capsule 1  . esomeprazole (NEXIUM) 40 MG capsule TAKE 1 CAPSULE BY MOUTH EVERY DAY BEFORE BREAKFAST (Patient taking differently: Take 40 mg by mouth daily.) 90 capsule 1  . fluticasone (FLONASE) 50 MCG/ACT nasal spray Place 2 sprays into both nostrils daily. (Patient taking differently: Place 2 sprays into both nostrils daily as needed for allergies.) 16 g 6  . hydrochlorothiazide  (HYDRODIURIL) 25 MG tablet Take 1 tablet (25 mg total) by mouth daily. 90 tablet 3  . ibuprofen (ADVIL) 200 MG tablet Take 400 mg by mouth 3 (three) times daily as needed for moderate pain.    Marland Kitchen irbesartan (AVAPRO) 150 MG tablet Take 225 mg by mouth daily.    . Melatonin 5 MG TABS Take 10 mg by mouth at bedtime as needed (sleep).    . mupirocin ointment (BACTROBAN) 2 % Apply 1 application topically daily as needed (skin bumps).    Marland Kitchen oxyCODONE-acetaminophen (PERCOCET) 10-325 MG tablet Take 1 tablet by mouth 4 (four) times daily.    . rosuvastatin (CRESTOR) 10 MG tablet TAKE 1 TABLET BY MOUTH EVERY DAY (Patient taking differently: Take 10 mg by mouth daily.) 90 tablet 1  . tamsulosin (FLOMAX) 0.4 MG CAPS capsule Take 0.4 mg by mouth daily.     Marland Kitchen zolpidem (AMBIEN) 10 MG tablet TAKE 1 TABLET BY MOUTH EVERYDAY AT BEDTIME (Patient taking differently: Take 10 mg by mouth at bedtime.) 30 tablet 2   No current facility-administered medications on file prior to visit.    Allergies  Allergen Reactions  . Heparin Anaphylaxis    breathing problems  . Succinylcholine Chloride Other (See Comments)    does not wake up from anesthetic, pt. Reports that through testing that he was found  not to have the enzyme to breakdown the Succ.  . Vancomycin Hives and Other (See Comments)    Ran off a pump. So developed redman's . Running too fast  . Penicillins Rash    Did it involve swelling of the face/tongue/throat, SOB, or low BP? No Did it involve sudden or severe rash/hives, skin peeling, or any reaction on the inside of your mouth or nose? No Did you need to seek medical attention at a hospital or doctor's office? No When did it last happen?childhood If all above answers are "NO", may proceed with cephalosporin use.     Assessment/Plan:  1. Hypertension - BP in room today 122/58 which is at goal of <130/80.  Recommend patient continue current medication therapy and diet changes.  Recommend patient  purchase an Omron BP cuff if he can not bring in his CVS cuff for validation.  Placed order for BMP to be performed at time of pre op lab work and printed lab requisition form.  Patient voiced understanding.  Recheck in 4 weeks.  Continue amlodipine 10mg  daily Continue irbesartan 225mg  daily  Continue HCTZ 25mg  daily Check BMP on 05/12/20  Karren Cobble, PharmD, BCACP, [place], [place] [address] [place], [place] [address] Phone: [phone]; Fax: [phone] 04/26/2020 10:25 AM Recheck in 4 weeks

## 2020-05-03 DIAGNOSIS — N401 Enlarged prostate with lower urinary tract symptoms: Secondary | ICD-10-CM | POA: Diagnosis not present

## 2020-05-03 DIAGNOSIS — R350 Frequency of micturition: Secondary | ICD-10-CM | POA: Diagnosis not present

## 2020-05-08 DIAGNOSIS — M5416 Radiculopathy, lumbar region: Secondary | ICD-10-CM | POA: Diagnosis not present

## 2020-05-08 NOTE — Progress Notes (Signed)
DUE TO COVID-19 ONLY ONE VISITOR IS ALLOWED TO COME WITH YOU AND STAY IN THE WAITING ROOM ONLY DURING PRE OP AND PROCEDURE DAY OF SURGERY. THE 1 VISITOR  MAY VISIT WITH YOU AFTER SURGERY IN YOUR PRIVATE ROOM DURING VISITING HOURS ONLY!  YOU NEED TO HAVE A COVID 19 TEST ON__3/21/22 _____ @_______ , THIS TEST MUST BE DONE BEFORE SURGERY,  COVID TESTING SITE [address] [place] [address], IT IS ON THE RIGHT GOING OUT [address] APPROXIMATELY  2 MINUTES PAST [employer] ON THE RIGHT. ONCE YOUR COVID TEST IS COMPLETED,  PLEASE BEGIN THE QUARANTINE INSTRUCTIONS AS OUTLINED IN YOUR HANDOUT.                Dylan [name]  05/08/2020   Your procedure is scheduled on: 05/18/20   Report to [facility] Main  Entrance   Report to admitting at   0530 AM     Call this number if you have problems the morning of surgery [phone]    REMEMBER: NO  SOLID FOOD CANDY OR GUM AFTER MIDNIGHT. CLEAR LIQUIDS UNTIL  0430am        . NOTHING BY MOUTH EXCEPT CLEAR LIQUIDS UNTIL    . PLEASE FINISH ENSURE DRINK PER SURGEON ORDER  WHICH NEEDS TO BE COMPLETED AT      . 0430am      CLEAR LIQUID DIET   Foods Allowed                                                                    Coffee and tea, regular and decaf                            Fruit ices (not with fruit pulp)                                      Iced Popsicles                                    Carbonated beverages, regular and diet                                    Cranberry, grape and apple juices Sports drinks like Gatorade Lightly seasoned clear broth or consume(fat free) Sugar, honey syrup ___________________________________________________________________      BRUSH YOUR TEETH MORNING OF SURGERY AND RINSE YOUR MOUTH OUT, NO CHEWING GUM CANDY OR MINTS.     Take these medicines the morning of surgery with A SIP OF WATER: amlodipine, nexium  DO NOT TAKE ANY DIABETIC MEDICATIONS DAY OF YOUR SURGERY                                You may not have any metal on your body including hair pins and              piercings  Do not wear jewelry,  make-up, lotions, powders or perfumes, deodorant             Do not wear nail polish on your fingernails.  Do not shave  48 hours prior to surgery.              Men may shave face and neck.   Do not bring valuables to the hospital. [place].  Contacts, dentures or bridgework may not be worn into surgery.  Leave suitcase in the car. After surgery it may be brought to your room.     Patients discharged the day of surgery will not be allowed to drive home. IF YOU ARE HAVING SURGERY AND GOING HOME THE SAME DAY, YOU MUST HAVE AN ADULT TO DRIVE YOU HOME AND BE WITH YOU FOR 24 HOURS. YOU MAY GO HOME BY TAXI OR UBER OR ORTHERWISE, BUT AN ADULT MUST ACCOMPANY YOU HOME AND STAY WITH YOU FOR 24 HOURS.  Name and phone number of your driver:  Special Instructions: N/A              Please read over the following fact sheets you were given: _____________________________________________________________________  [facility] - Preparing for Surgery Before surgery, you can play an important role.  Because skin is not sterile, your skin needs to be as free of germs as possible.  You can reduce the number of germs on your skin by washing with CHG (chlorahexidine gluconate) soap before surgery.  CHG is an antiseptic cleaner which kills germs and bonds with the skin to continue killing germs even after washing. Please DO NOT use if you have an allergy to CHG or antibacterial soaps.  If your skin becomes reddened/irritated stop using the CHG and inform your nurse when you arrive at Short Stay. Do not shave (including legs and underarms) for at least 48 hours prior to the first CHG shower.  You may shave your face/neck. Please follow these instructions carefully:  1.  Shower with CHG Soap the night before surgery and the  morning of  Surgery.  2.  If you choose to wash your hair, wash your hair first as usual with your  normal  shampoo.  3.  After you shampoo, rinse your hair and body thoroughly to remove the  shampoo.                           4.  Use CHG as you would any other liquid soap.  You can apply chg directly  to the skin and wash                       Gently with a scrungie or clean washcloth.  5.  Apply the CHG Soap to your body ONLY FROM THE NECK DOWN.   Do not use on face/ open                           Wound or open sores. Avoid contact with eyes, ears mouth and genitals (private parts).                       Wash face,  Genitals (private parts) with your normal soap.             6.  Wash thoroughly, paying special attention  to the area where your surgery  will be performed.  7.  Thoroughly rinse your body with warm water from the neck down.  8.  DO NOT shower/wash with your normal soap after using and rinsing off  the CHG Soap.                9.  Pat yourself dry with a clean towel.            10.  Wear clean pajamas.            11.  Place clean sheets on your bed the night of your first shower and do not  sleep with pets. Day of Surgery : Do not apply any lotions/deodorants the morning of surgery.  Please wear clean clothes to the hospital/surgery center.  FAILURE TO FOLLOW THESE INSTRUCTIONS MAY RESULT IN THE CANCELLATION OF YOUR SURGERY PATIENT SIGNATURE_________________________________  NURSE SIGNATURE__________________________________  ________________________________________________________________________

## 2020-05-12 ENCOUNTER — Other Ambulatory Visit: Payer: Self-pay

## 2020-05-12 ENCOUNTER — Encounter (HOSPITAL_COMMUNITY): Payer: Self-pay

## 2020-05-12 ENCOUNTER — Encounter (HOSPITAL_COMMUNITY)
Admission: RE | Admit: 2020-05-12 | Discharge: 2020-05-12 | Disposition: A | Discharge status: Home / Self Care | Payer: Medicare HMO | Source: Ambulatory Visit | Attending: Orthopedic Surgery | Admitting: Orthopedic Surgery

## 2020-05-12 DIAGNOSIS — Z791 Long term (current) use of non-steroidal anti-inflammatories (NSAID): Secondary | ICD-10-CM | POA: Diagnosis not present

## 2020-05-12 DIAGNOSIS — G4733 Obstructive sleep apnea (adult) (pediatric): Secondary | ICD-10-CM | POA: Insufficient documentation

## 2020-05-12 DIAGNOSIS — E8809 Other disorders of plasma-protein metabolism, not elsewhere classified: Secondary | ICD-10-CM | POA: Diagnosis not present

## 2020-05-12 DIAGNOSIS — G5782 Other specified mononeuropathies of left lower limb: Secondary | ICD-10-CM | POA: Insufficient documentation

## 2020-05-12 DIAGNOSIS — Z79899 Other long term (current) drug therapy: Secondary | ICD-10-CM | POA: Insufficient documentation

## 2020-05-12 DIAGNOSIS — Z01818 Encounter for other preprocedural examination: Secondary | ICD-10-CM | POA: Insufficient documentation

## 2020-05-12 DIAGNOSIS — I10 Essential (primary) hypertension: Secondary | ICD-10-CM | POA: Insufficient documentation

## 2020-05-12 DIAGNOSIS — Z7982 Long term (current) use of aspirin: Secondary | ICD-10-CM | POA: Diagnosis not present

## 2020-05-12 DIAGNOSIS — Y838 Other surgical procedures as the cause of abnormal reaction of the patient, or of later complication, without mention of misadventure at the time of the procedure: Secondary | ICD-10-CM | POA: Insufficient documentation

## 2020-05-12 DIAGNOSIS — T84093A Other mechanical complication of internal left knee prosthesis, initial encounter: Secondary | ICD-10-CM | POA: Insufficient documentation

## 2020-05-12 DIAGNOSIS — Z981 Arthrodesis status: Secondary | ICD-10-CM | POA: Insufficient documentation

## 2020-05-12 DIAGNOSIS — Z85820 Personal history of malignant melanoma of skin: Secondary | ICD-10-CM | POA: Diagnosis not present

## 2020-05-12 DIAGNOSIS — Z79891 Long term (current) use of opiate analgesic: Secondary | ICD-10-CM | POA: Insufficient documentation

## 2020-05-12 DIAGNOSIS — I2510 Atherosclerotic heart disease of native coronary artery without angina pectoris: Secondary | ICD-10-CM | POA: Diagnosis not present

## 2020-05-12 HISTORY — DX: Other disorders of plasma-protein metabolism, not elsewhere classified: E88.09

## 2020-05-12 LAB — BASIC METABOLIC PANEL
Anion gap: 10 (ref 5–15)
BUN: 14 mg/dL (ref 8–23)
CO2: 25 mmol/L (ref 22–32)
Calcium: 9.3 mg/dL (ref 8.9–10.3)
Chloride: 98 mmol/L (ref 98–111)
Creatinine, Ser: 0.82 mg/dL (ref 0.61–1.24)
GFR, Estimated: >60 mL/min (ref >60)
Glucose, Bld: 122 mg/dL — ABNORMAL HIGH (ref 70–99)
Potassium: 4.3 mmol/L (ref 3.5–5.1)
Sodium: 133 mmol/L — ABNORMAL LOW (ref 135–145)

## 2020-05-12 LAB — CBC
HCT: 36.9 % — ABNORMAL LOW (ref 39.0–52.0)
Hemoglobin: 12.5 g/dL — ABNORMAL LOW (ref 13.0–17.0)
MCH: 29.8 pg (ref 26.0–34.0)
MCHC: 33.9 g/dL (ref 30.0–36.0)
MCV: 88.1 fL (ref 80.0–100.0)
Platelets: 290 10*3/uL (ref 150–400)
RBC: 4.19 MIL/uL — ABNORMAL LOW (ref 4.22–5.81)
RDW: 12.8 % (ref 11.5–15.5)
WBC: 7 10*3/uL (ref 4.0–10.5)
nRBC: 0 % (ref 0.0–0.2)

## 2020-05-12 LAB — SURGICAL PCR SCREEN
MRSA, PCR: POSITIVE — AB
Staphylococcus aureus: POSITIVE — AB

## 2020-05-12 NOTE — Progress Notes (Signed)
Anesthesia Review:  PCP: DR Ingalls [place] Cardiologist : DR ross- 04/26/20- LOV with Cardiology  Chest x-ray : EKG :05/12/20  Echo : Stress test: Cardiac Cath : 2019  Activity level:  Sleep Study/ CPAP : Fasting Blood Sugar :      / Checks Blood Sugar -- times a day:   Blood Thinner/ Instructions /Last Dose: ASA / Instructions/ Last Dose :  Does not have enzyme to break down Succinycholine

## 2020-05-15 ENCOUNTER — Other Ambulatory Visit (HOSPITAL_COMMUNITY)
Admission: RE | Admit: 2020-05-15 | Discharge: 2020-05-15 | Disposition: A | Discharge status: Home / Self Care | Payer: Medicare HMO | Source: Ambulatory Visit | Attending: Orthopedic Surgery | Admitting: Orthopedic Surgery

## 2020-05-15 ENCOUNTER — Encounter (HOSPITAL_COMMUNITY): Payer: Self-pay

## 2020-05-15 DIAGNOSIS — Z01812 Encounter for preprocedural laboratory examination: Secondary | ICD-10-CM | POA: Insufficient documentation

## 2020-05-15 DIAGNOSIS — Z20822 Contact with and (suspected) exposure to covid-19: Secondary | ICD-10-CM | POA: Insufficient documentation

## 2020-05-15 LAB — SARS CORONAVIRUS 2 (TAT 6-24 HRS): SARS Coronavirus 2: NEGATIVE

## 2020-05-15 NOTE — Anesthesia Preprocedure Evaluation (Deleted)
Anesthesia Evaluation    Airway        Dental   Pulmonary           Cardiovascular hypertension,      Neuro/Psych    GI/Hepatic   Endo/Other    Renal/GU      Musculoskeletal   Abdominal   Peds  Hematology   Anesthesia Other Findings   Reproductive/Obstetrics                             Anesthesia Physical Anesthesia Plan  ASA:   Anesthesia Plan:    Post-op Pain Management:    Induction:   PONV Risk Score and Plan:   Airway Management Planned:   Additional Equipment:   Intra-op Plan:   Post-operative Plan:   Informed Consent:   Plan Discussed with:   Anesthesia Plan Comments: (See APP note by Durel Salts, FNP. Pseudocholinesterase deficiency. )        Anesthesia Quick Evaluation

## 2020-05-15 NOTE — Progress Notes (Signed)
Anesthesia Chart Review:   Case: [id number] Date/Time: 05/18/20 0715   Procedure: Left knee excision saphenous nerve branch, neurectomy, left open scar excision with polyethlene revision (Left Knee) - 90 mins Spinal with adductor block   Anesthesia type: Spinal   Pre-op diagnosis: Status post left total knee with pain related to scar, saphenous neuritis   Location: [facility] ROOM 09 / [facility]   Surgeons: [name], MD      DISCUSSION: Pt is 70 years old with hx CAD (mild nonobstructive by 2019 cath), HTN, malignant melanoma, OSA. 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 Last office visit 01/14/20 - Attends HTN clinic- last visit 04/26/20   LABS: Labs reviewed: Acceptable for surgery. (all labs ordered are listed, but only abnormal results are displayed)  Labs Reviewed  SURGICAL PCR SCREEN - Abnormal; Notable for the following components:      Result Value   MRSA, PCR POSITIVE (*)    Staphylococcus aureus POSITIVE (*)    All other components within normal limits  CBC - Abnormal; Notable for the following components:   RBC 4.19 (*)    Hemoglobin 12.5 (*)    HCT 36.9 (*)    All other components within normal limits  BASIC METABOLIC PANEL - Abnormal; Notable for the following components:   Sodium 133 (*)    Glucose, Bld 122 (*)    All other components within normal limits    EKG 05/12/20: NSR with 1st degree A-V block. Incomplete RBBB. LAFB.    CV: Cardiac cath 11/14/17:   Prox LAD lesion is 10% stenosed. Nonobstructive CAD.  The left ventricular systolic function is normal.  LV end diastolic pressure is mildly elevated. LVEDP 18 mm Hg.  The left ventricular ejection fraction is 55-65% by visual estimate.  There  is no aortic valve stenosis.     Past Medical History:  Diagnosis Date  . Allergy    allergic rhinitis  . Anxiety   . Arthritis    R knee, hands , back oa  . BPH (benign prostatic hyperplasia)   . Cancer ([place])    melonoma- on face, treated with excision   . Complication of anesthesia    trouble waking up with succycholine, does not have enzyme to break down succycholine  . Coronary artery disease    nonobstructive  . Dizziness    MRI brain unremarkable 02/05/2015. resolved as of 01-27-2020  . History of melanoma 15 yrs ago   face and forehead-- previously followed by Derm in [place]  . History of migraine    none recent as of 01-27-2020  . History of ulcerative colitis    non recent flares  . Hyperlipidemia   . Hypertension   . Pseudocholinesterase deficiency   . Sleep apnea    last sleep study 10 yrs ago,CPAP-  no current cpap use     Past Surgical History:  Procedure Laterality Date  . acl and mensicus repair reconstruct Right 1980's  . ANTERIOR CERVICAL DECOMP/DISCECTOMY FUSION  10/15/2011   Procedure: ANTERIOR CERVICAL DECOMPRESSION/DISCECTOMY FUSION 1 LEVEL;  Surgeon: [name], MD;  Location: [facility];  Service: Neurosurgery;  Laterality: N/A;  Cervical four-five Anterior cervical decompression/diskectomy/fusion  .  APPENDECTOMY  1969  . COLONOSCOPY  last done 2018  . ELBOW SURGERY Right 2019   tendon replacement  . HERNIA REPAIR     inguinal / abdominal - repaired as an infant   . JOINT REPLACEMENT    . KNEE ARTHROSCOPY Left last done oct 2013   x 3  . KNEE SURGERY  yrs ago   right knee  . LEFT HEART CATH AND CORONARY ANGIOGRAPHY N/A 11/14/2017   Procedure: LEFT HEART CATH AND CORONARY ANGIOGRAPHY;  Surgeon: [name], MD;  Location: [facility] CV LAB;  Service: Cardiovascular;  Laterality: N/A;  . LUMBAR LAMINECTOMY WITH COFLEX 2 LEVEL N/A 09/12/2015   Procedure: L3-4 L4-5 Laminectomy with coflex;  Surgeon: [name], MD;  Location: [facility]  NEURO ORS;  Service: Neurosurgery;  Laterality: N/A;  L3-4 L4-5 Laminectomy with coflex  . NASAL SINUS SURGERY     multiple sinus surgery - 1980's   . TONSILLECTOMY  1969  . TOTAL KNEE ARTHROPLASTY Left 10/22/2012   Procedure: TOTAL KNEE ARTHROPLASTY;  Surgeon: [name], MD;  Location: [facility];  Service: Orthopedics;  Laterality: Left;  . TRANSURETHRAL RESECTION OF PROSTATE N/A 01/31/2020   Procedure: TRANSURETHRAL RESECTION OF THE PROSTATE (TURP);  Surgeon: [name], MD;  Location: [facility];  Service: Urology;  Laterality: N/A;  75 MIS    MEDICATIONS: . acetaminophen (TYLENOL) 650 MG CR tablet  . amLODipine (NORVASC) 10 MG tablet  . aspirin EC 81 MG tablet  . cetirizine (ZYRTEC) 10 MG tablet  . Coenzyme Q10 (COQ10) 200 MG CAPS  . dutasteride (AVODART) 0.5 MG capsule  . esomeprazole (NEXIUM) 40 MG capsule  . fluticasone (FLONASE) 50 MCG/ACT nasal spray  . hydrochlorothiazide (HYDRODIURIL) 25 MG tablet  . ibuprofen (ADVIL) 200 MG tablet  . irbesartan (AVAPRO) 150 MG tablet  . Melatonin 5 MG TABS  . mupirocin ointment (BACTROBAN) 2 %  . oxyCODONE-acetaminophen (PERCOCET) 10-325 MG tablet  . rosuvastatin (CRESTOR) 10 MG tablet  . tamsulosin (FLOMAX) 0.4 MG CAPS capsule  . zolpidem (AMBIEN) 10 MG tablet   No current facility-administered medications for this encounter.    If no changes, I anticipate pt can proceed with surgery as scheduled.   Willeen Cass, PhD, FNP-BC [facility]/Anesthesiology Phone: [phone] 05/15/2020 9:44 AM

## 2020-05-17 NOTE — Anesthesia Preprocedure Evaluation (Addendum)
Anesthesia Evaluation  Patient identified by MRN, date of birth, ID band Patient awake    Reviewed: Allergy & Precautions, NPO status , Patient's Chart, lab work & pertinent test results  History of Anesthesia Complications (+) PSEUDOCHOLINESTERASE DEFICIENCY and history of anesthetic complications  Airway Mallampati: II  TM Distance: >3 FB Neck ROM: Full    Dental no notable dental hx. (+) Dental Advisory Given   Pulmonary sleep apnea ,    Pulmonary exam normal        Cardiovascular hypertension, Pt. on medications + CAD  Normal cardiovascular exam  HLD  LHC 2019 Prox LAD lesion is 10% stenosed. Nonobstructive CAD. The left ventricular systolic function is normal. LV end diastolic pressure is mildly elevated. LVEDP 18 mm Hg. The left ventricular ejection fraction is 55-65% by visual estimate. There is no aortic valve stenosis.    Neuro/Psych  Headaches, PSYCHIATRIC DISORDERS Anxiety    GI/Hepatic GERD  Medicated and Controlled,(+)     Substance abuse: takes oxycodone 10mg  QID.  , Hepatitis -, A  Endo/Other  negative endocrine ROS  Renal/GU negative Renal ROS  negative genitourinary   Musculoskeletal  (+) Arthritis ,   Abdominal   Peds  Hematology negative hematology ROS (+)   Anesthesia Other Findings Pseudocholinesterase deficiency     Reproductive/Obstetrics                            Anesthesia Physical  Anesthesia Plan  ASA: III  Anesthesia Plan: Spinal   Post-op Pain Management:  Regional for Post-op pain   Induction: Intravenous  PONV Risk Score and Plan: 2 and Ondansetron and Propofol infusion  Airway Management Planned: Natural Airway  Additional Equipment:   Intra-op Plan:   Post-operative Plan:   Informed Consent: I have reviewed the patients History and Physical, chart, labs and discussed the procedure including the risks, benefits and  alternatives for the proposed anesthesia with the patient or authorized representative who has indicated his/her understanding and acceptance.     Dental advisory given  Plan Discussed with: Anesthesiologist and CRNA  Anesthesia Plan Comments: (See APP note by Durel Salts, FNP. Pseudocholinesterase deficiency. )       Anesthesia Quick Evaluation

## 2020-05-18 ENCOUNTER — Inpatient Hospital Stay (HOSPITAL_COMMUNITY): Payer: Medicare HMO | Admitting: Physician Assistant

## 2020-05-18 ENCOUNTER — Other Ambulatory Visit: Payer: Self-pay

## 2020-05-18 ENCOUNTER — Encounter (HOSPITAL_COMMUNITY)
Admission: RE | Disposition: A | Discharge status: Home / Self Care | Payer: Self-pay | Source: Ambulatory Visit | Attending: Orthopedic Surgery

## 2020-05-18 ENCOUNTER — Ambulatory Visit: Payer: Self-pay | Admitting: Internal Medicine

## 2020-05-18 ENCOUNTER — Inpatient Hospital Stay (HOSPITAL_COMMUNITY)
Admission: RE | Admit: 2020-05-18 | Discharge: 2020-05-19 | DRG: 489 | Disposition: A | Discharge status: Home / Self Care | Payer: Medicare HMO | Source: Ambulatory Visit | Attending: Orthopedic Surgery | Admitting: Orthopedic Surgery

## 2020-05-18 ENCOUNTER — Encounter (HOSPITAL_COMMUNITY): Payer: Self-pay | Admitting: Orthopedic Surgery

## 2020-05-18 DIAGNOSIS — F419 Anxiety disorder, unspecified: Secondary | ICD-10-CM | POA: Diagnosis present

## 2020-05-18 DIAGNOSIS — Z20822 Contact with and (suspected) exposure to covid-19: Secondary | ICD-10-CM | POA: Diagnosis not present

## 2020-05-18 DIAGNOSIS — Z881 Allergy status to other antibiotic agents status: Secondary | ICD-10-CM | POA: Diagnosis not present

## 2020-05-18 DIAGNOSIS — G629 Polyneuropathy, unspecified: Secondary | ICD-10-CM | POA: Diagnosis present

## 2020-05-18 DIAGNOSIS — K219 Gastro-esophageal reflux disease without esophagitis: Secondary | ICD-10-CM | POA: Diagnosis not present

## 2020-05-18 DIAGNOSIS — I2510 Atherosclerotic heart disease of native coronary artery without angina pectoris: Secondary | ICD-10-CM | POA: Diagnosis present

## 2020-05-18 DIAGNOSIS — Z96652 Presence of left artificial knee joint: Secondary | ICD-10-CM

## 2020-05-18 DIAGNOSIS — Z888 Allergy status to other drugs, medicaments and biological substances status: Secondary | ICD-10-CM

## 2020-05-18 DIAGNOSIS — Z9989 Dependence on other enabling machines and devices: Secondary | ICD-10-CM | POA: Diagnosis not present

## 2020-05-18 DIAGNOSIS — T8484XA Pain due to internal orthopedic prosthetic devices, implants and grafts, initial encounter: Secondary | ICD-10-CM | POA: Diagnosis not present

## 2020-05-18 DIAGNOSIS — G8918 Other acute postprocedural pain: Secondary | ICD-10-CM | POA: Diagnosis not present

## 2020-05-18 DIAGNOSIS — I10 Essential (primary) hypertension: Secondary | ICD-10-CM | POA: Diagnosis present

## 2020-05-18 DIAGNOSIS — G8929 Other chronic pain: Secondary | ICD-10-CM | POA: Diagnosis present

## 2020-05-18 DIAGNOSIS — T8489XA Other specified complication of internal orthopedic prosthetic devices, implants and grafts, initial encounter: Secondary | ICD-10-CM | POA: Diagnosis present

## 2020-05-18 DIAGNOSIS — M5441 Lumbago with sciatica, right side: Secondary | ICD-10-CM | POA: Diagnosis present

## 2020-05-18 DIAGNOSIS — E785 Hyperlipidemia, unspecified: Secondary | ICD-10-CM | POA: Diagnosis not present

## 2020-05-18 DIAGNOSIS — G4733 Obstructive sleep apnea (adult) (pediatric): Secondary | ICD-10-CM | POA: Diagnosis not present

## 2020-05-18 DIAGNOSIS — Z880 Allergy status to penicillin: Secondary | ICD-10-CM

## 2020-05-18 DIAGNOSIS — Z79899 Other long term (current) drug therapy: Secondary | ICD-10-CM | POA: Diagnosis not present

## 2020-05-18 DIAGNOSIS — T84093A Other mechanical complication of internal left knee prosthesis, initial encounter: Secondary | ICD-10-CM | POA: Diagnosis not present

## 2020-05-18 HISTORY — PX: TOTAL KNEE REVISION WITH SCAR DEBRIDEMENT/PATELLA REVISION WITH POLY EXCHANGE: SHX6128

## 2020-05-18 SURGERY — TOTAL KNEE REVISION WITH SCAR DEBRIDEMENT/PATELLA REVISION WITH POLY EXCHANGE
Anesthesia: Spinal | Site: Knee | Laterality: Left

## 2020-05-18 MED ORDER — PROPOFOL 10 MG/ML IV BOLUS
INTRAVENOUS | Status: DC | PRN
  Administered 2020-05-18 (×3): 20 mg via INTRAVENOUS

## 2020-05-18 MED ORDER — ACETAMINOPHEN 325 MG PO TABS
325.0000 mg | ORAL_TABLET | Freq: Four times a day (QID) | ORAL | Status: DC | PRN
  Administered 2020-05-18: 650 mg via ORAL
  Filled 2020-05-18: qty 2

## 2020-05-18 MED ORDER — ZOLPIDEM TARTRATE 5 MG PO TABS
5.0000 mg | ORAL_TABLET | Freq: Every day | ORAL | Status: DC
  Administered 2020-05-18: 5 mg via ORAL
  Filled 2020-05-18: qty 1

## 2020-05-18 MED ORDER — ASPIRIN 81 MG PO CHEW
81.0000 mg | CHEWABLE_TABLET | Freq: Two times a day (BID) | ORAL | Status: DC
  Administered 2020-05-18 – 2020-05-19 (×2): 81 mg via ORAL
  Filled 2020-05-18 (×2): qty 1

## 2020-05-18 MED ORDER — TRANEXAMIC ACID-NACL 1000-0.7 MG/100ML-% IV SOLN
1000.0000 mg | INTRAVENOUS | Status: AC
  Administered 2020-05-18: 1000 mg via INTRAVENOUS
  Filled 2020-05-18: qty 100

## 2020-05-18 MED ORDER — METHOCARBAMOL 500 MG IVPB - SIMPLE MED
500.0000 mg | Freq: Four times a day (QID) | INTRAVENOUS | Status: DC | PRN
  Filled 2020-05-18: qty 50

## 2020-05-18 MED ORDER — DEXAMETHASONE SODIUM PHOSPHATE 10 MG/ML IJ SOLN
INTRAMUSCULAR | Status: DC | PRN
  Administered 2020-05-18: 8 mg

## 2020-05-18 MED ORDER — METOCLOPRAMIDE HCL 5 MG PO TABS: 5.0000 mg | ORAL_TABLET | Freq: Three times a day (TID) | ORAL | Status: DC | PRN

## 2020-05-18 MED ORDER — MENTHOL 3 MG MT LOZG: 1.0000 | LOZENGE | OROMUCOSAL | Status: DC | PRN

## 2020-05-18 MED ORDER — PROPOFOL 500 MG/50ML IV EMUL
INTRAVENOUS | Status: DC | PRN
  Administered 2020-05-18: 125 ug/kg/min via INTRAVENOUS

## 2020-05-18 MED ORDER — KETOROLAC TROMETHAMINE 30 MG/ML IJ SOLN
INTRAMUSCULAR | Status: AC
  Filled 2020-05-18: qty 1

## 2020-05-18 MED ORDER — ROSUVASTATIN CALCIUM 10 MG PO TABS
10.0000 mg | ORAL_TABLET | Freq: Every day | ORAL | Status: DC
  Administered 2020-05-18: 10 mg via ORAL
  Filled 2020-05-18: qty 1

## 2020-05-18 MED ORDER — METOCLOPRAMIDE HCL 5 MG/ML IJ SOLN
5.0000 mg | Freq: Three times a day (TID) | INTRAMUSCULAR | Status: DC | PRN
Start: 2020-05-18 — End: 2020-05-19

## 2020-05-18 MED ORDER — BUPIVACAINE-EPINEPHRINE (PF) 0.25% -1:200000 IJ SOLN
INTRAMUSCULAR | Status: DC | PRN
  Administered 2020-05-18: 30 mL

## 2020-05-18 MED ORDER — FENTANYL CITRATE (PF) 100 MCG/2ML IJ SOLN: 25.0000 ug | INTRAMUSCULAR | Status: DC | PRN

## 2020-05-18 MED ORDER — PROPOFOL 500 MG/50ML IV EMUL
INTRAVENOUS | Status: AC
  Filled 2020-05-18: qty 50

## 2020-05-18 MED ORDER — FENTANYL CITRATE (PF) 100 MCG/2ML IJ SOLN
INTRAMUSCULAR | Status: AC
  Filled 2020-05-18: qty 2

## 2020-05-18 MED ORDER — PANTOPRAZOLE SODIUM 40 MG PO TBEC
40.0000 mg | DELAYED_RELEASE_TABLET | Freq: Every day | ORAL | Status: DC
  Administered 2020-05-19: 40 mg via ORAL
  Filled 2020-05-18: qty 1

## 2020-05-18 MED ORDER — PHENYLEPHRINE 40 MCG/ML (10ML) SYRINGE FOR IV PUSH (FOR BLOOD PRESSURE SUPPORT)
PREFILLED_SYRINGE | INTRAVENOUS | Status: AC
  Filled 2020-05-18: qty 10

## 2020-05-18 MED ORDER — IRBESARTAN 75 MG PO TABS
225.0000 mg | ORAL_TABLET | Freq: Every day | ORAL | Status: DC
  Administered 2020-05-19: 225 mg via ORAL
  Filled 2020-05-18: qty 1

## 2020-05-18 MED ORDER — VANCOMYCIN HCL IN DEXTROSE 1-5 GM/200ML-% IV SOLN
1000.0000 mg | Freq: Once | INTRAVENOUS | Status: AC
  Administered 2020-05-18: 1000 mg via INTRAVENOUS
  Filled 2020-05-18: qty 200

## 2020-05-18 MED ORDER — DEXAMETHASONE SODIUM PHOSPHATE 10 MG/ML IJ SOLN
10.0000 mg | Freq: Once | INTRAMUSCULAR | Status: AC
  Administered 2020-05-19: 10 mg via INTRAVENOUS
  Filled 2020-05-18: qty 1

## 2020-05-18 MED ORDER — CHLORHEXIDINE GLUCONATE 0.12 % MT SOLN
15.0000 mL | Freq: Once | OROMUCOSAL | Status: AC
  Administered 2020-05-18: 15 mL via OROMUCOSAL

## 2020-05-18 MED ORDER — ONDANSETRON HCL 4 MG/2ML IJ SOLN
INTRAMUSCULAR | Status: DC | PRN
  Administered 2020-05-18: 4 mg via INTRAVENOUS

## 2020-05-18 MED ORDER — SODIUM CHLORIDE (PF) 0.9 % IJ SOLN
INTRAMUSCULAR | Status: DC | PRN
  Administered 2020-05-18: 50 mL

## 2020-05-18 MED ORDER — ONDANSETRON HCL 4 MG/2ML IJ SOLN: 4.0000 mg | Freq: Four times a day (QID) | INTRAMUSCULAR | Status: DC | PRN

## 2020-05-18 MED ORDER — SODIUM CHLORIDE 0.9 % IV SOLN: INTRAVENOUS | Status: DC

## 2020-05-18 MED ORDER — CEFAZOLIN SODIUM-DEXTROSE 2-4 GM/100ML-% IV SOLN
2.0000 g | Freq: Four times a day (QID) | INTRAVENOUS | Status: AC
  Administered 2020-05-18 (×2): 2 g via INTRAVENOUS
  Filled 2020-05-18 (×2): qty 100

## 2020-05-18 MED ORDER — PHENOL 1.4 % MT LIQD: 1.0000 | OROMUCOSAL | Status: DC | PRN

## 2020-05-18 MED ORDER — STERILE WATER FOR IRRIGATION IR SOLN
Status: DC | PRN
  Administered 2020-05-18: 2000 mL

## 2020-05-18 MED ORDER — LACTATED RINGERS IV SOLN: INTRAVENOUS | Status: DC

## 2020-05-18 MED ORDER — BISACODYL 10 MG RE SUPP: 10.0000 mg | Freq: Every day | RECTAL | Status: DC | PRN

## 2020-05-18 MED ORDER — FERROUS SULFATE 325 (65 FE) MG PO TABS
325.0000 mg | ORAL_TABLET | Freq: Three times a day (TID) | ORAL | Status: DC
  Administered 2020-05-19: 325 mg via ORAL
  Filled 2020-05-18: qty 1

## 2020-05-18 MED ORDER — PROPOFOL 1000 MG/100ML IV EMUL
INTRAVENOUS | Status: AC
  Filled 2020-05-18: qty 100

## 2020-05-18 MED ORDER — HYDROMORPHONE HCL 1 MG/ML IJ SOLN
0.5000 mg | INTRAMUSCULAR | Status: DC | PRN
  Administered 2020-05-18 (×2): 1 mg via INTRAVENOUS
  Filled 2020-05-18 (×2): qty 1

## 2020-05-18 MED ORDER — CELECOXIB 200 MG PO CAPS
200.0000 mg | ORAL_CAPSULE | Freq: Once | ORAL | Status: AC
  Administered 2020-05-18: 200 mg via ORAL
  Filled 2020-05-18: qty 1

## 2020-05-18 MED ORDER — CEFAZOLIN SODIUM-DEXTROSE 2-4 GM/100ML-% IV SOLN
2.0000 g | INTRAVENOUS | Status: AC
  Administered 2020-05-18: 2 g via INTRAVENOUS
  Filled 2020-05-18: qty 100

## 2020-05-18 MED ORDER — TRANEXAMIC ACID-NACL 1000-0.7 MG/100ML-% IV SOLN
1000.0000 mg | Freq: Once | INTRAVENOUS | Status: AC
  Administered 2020-05-18: 1000 mg via INTRAVENOUS
  Filled 2020-05-18: qty 100

## 2020-05-18 MED ORDER — METHOCARBAMOL 500 MG PO TABS
500.0000 mg | ORAL_TABLET | Freq: Four times a day (QID) | ORAL | Status: DC | PRN
  Administered 2020-05-19: 500 mg via ORAL
  Filled 2020-05-18: qty 1

## 2020-05-18 MED ORDER — CELECOXIB 200 MG PO CAPS
200.0000 mg | ORAL_CAPSULE | Freq: Two times a day (BID) | ORAL | Status: DC
  Administered 2020-05-18 – 2020-05-19 (×2): 200 mg via ORAL
  Filled 2020-05-18 (×2): qty 1

## 2020-05-18 MED ORDER — HYDROCHLOROTHIAZIDE 25 MG PO TABS
25.0000 mg | ORAL_TABLET | Freq: Every day | ORAL | Status: DC
  Administered 2020-05-19: 25 mg via ORAL
  Filled 2020-05-18: qty 1

## 2020-05-18 MED ORDER — FLUTICASONE PROPIONATE 50 MCG/ACT NA SUSP
2.0000 | Freq: Every day | NASAL | Status: DC
  Filled 2020-05-18: qty 16

## 2020-05-18 MED ORDER — FENTANYL CITRATE (PF) 100 MCG/2ML IJ SOLN
INTRAMUSCULAR | Status: DC | PRN
  Administered 2020-05-18 (×2): 50 ug via INTRAVENOUS

## 2020-05-18 MED ORDER — PHENYLEPHRINE 40 MCG/ML (10ML) SYRINGE FOR IV PUSH (FOR BLOOD PRESSURE SUPPORT)
PREFILLED_SYRINGE | INTRAVENOUS | Status: DC | PRN
  Administered 2020-05-18: 120 ug via INTRAVENOUS

## 2020-05-18 MED ORDER — LORATADINE 10 MG PO TABS
10.0000 mg | ORAL_TABLET | Freq: Every day | ORAL | Status: DC
  Administered 2020-05-19: 10 mg via ORAL
  Filled 2020-05-18: qty 1

## 2020-05-18 MED ORDER — AMLODIPINE BESYLATE 10 MG PO TABS
10.0000 mg | ORAL_TABLET | Freq: Every day | ORAL | Status: DC
  Administered 2020-05-19: 10 mg via ORAL
  Filled 2020-05-18: qty 1

## 2020-05-18 MED ORDER — DEXAMETHASONE SODIUM PHOSPHATE 10 MG/ML IJ SOLN: 10.0000 mg | Freq: Once | INTRAMUSCULAR | Status: DC

## 2020-05-18 MED ORDER — BUPIVACAINE-EPINEPHRINE (PF) 0.25% -1:200000 IJ SOLN
INTRAMUSCULAR | Status: AC
  Filled 2020-05-18: qty 30

## 2020-05-18 MED ORDER — OXYCODONE HCL 5 MG PO TABS
10.0000 mg | ORAL_TABLET | ORAL | Status: DC | PRN
  Administered 2020-05-18: 15 mg via ORAL
  Administered 2020-05-18: 10 mg via ORAL
  Administered 2020-05-18 – 2020-05-19 (×3): 15 mg via ORAL
  Filled 2020-05-18: qty 2
  Filled 2020-05-18 (×4): qty 3

## 2020-05-18 MED ORDER — POLYETHYLENE GLYCOL 3350 17 G PO PACK: 17.0000 g | PACK | Freq: Every day | ORAL | Status: DC | PRN

## 2020-05-18 MED ORDER — ACETAMINOPHEN 500 MG PO TABS
1000.0000 mg | ORAL_TABLET | Freq: Once | ORAL | Status: AC
  Administered 2020-05-18: 1000 mg via ORAL
  Filled 2020-05-18: qty 2

## 2020-05-18 MED ORDER — 0.9 % SODIUM CHLORIDE (POUR BTL) OPTIME
TOPICAL | Status: DC | PRN
  Administered 2020-05-18: 1000 mL

## 2020-05-18 MED ORDER — DOCUSATE SODIUM 100 MG PO CAPS
100.0000 mg | ORAL_CAPSULE | Freq: Two times a day (BID) | ORAL | Status: DC
  Administered 2020-05-18 – 2020-05-19 (×2): 100 mg via ORAL
  Filled 2020-05-18 (×2): qty 1

## 2020-05-18 MED ORDER — ORAL CARE MOUTH RINSE: 15.0000 mL | Freq: Once | OROMUCOSAL | Status: AC

## 2020-05-18 MED ORDER — MELATONIN 5 MG PO TABS: 10.0000 mg | ORAL_TABLET | Freq: Every evening | ORAL | Status: DC | PRN

## 2020-05-18 MED ORDER — MIDAZOLAM HCL 2 MG/2ML IJ SOLN
INTRAMUSCULAR | Status: DC | PRN
  Administered 2020-05-18: 2 mg via INTRAVENOUS

## 2020-05-18 MED ORDER — KETOROLAC TROMETHAMINE 30 MG/ML IJ SOLN
INTRAMUSCULAR | Status: DC | PRN
  Administered 2020-05-18: 30 mg

## 2020-05-18 MED ORDER — SODIUM CHLORIDE 0.9 % IR SOLN
Status: DC | PRN
  Administered 2020-05-18: 1000 mL

## 2020-05-18 MED ORDER — OXYCODONE HCL 5 MG PO TABS
5.0000 mg | ORAL_TABLET | ORAL | Status: DC | PRN
  Administered 2020-05-19: 10 mg via ORAL
  Filled 2020-05-18: qty 2

## 2020-05-18 MED ORDER — BUPIVACAINE IN DEXTROSE 0.75-8.25 % IT SOLN
INTRATHECAL | Status: DC | PRN
  Administered 2020-05-18: 1.8 mL via INTRATHECAL

## 2020-05-18 MED ORDER — ROPIVACAINE HCL 7.5 MG/ML IJ SOLN
INTRAMUSCULAR | Status: DC | PRN
  Administered 2020-05-18: 20 mL via PERINEURAL

## 2020-05-18 MED ORDER — ONDANSETRON HCL 4 MG PO TABS: 4.0000 mg | ORAL_TABLET | Freq: Four times a day (QID) | ORAL | Status: DC | PRN

## 2020-05-18 MED ORDER — MIDAZOLAM HCL 2 MG/2ML IJ SOLN
INTRAMUSCULAR | Status: AC
  Filled 2020-05-18: qty 2

## 2020-05-18 MED ORDER — PROPOFOL 10 MG/ML IV BOLUS
INTRAVENOUS | Status: AC
  Filled 2020-05-18: qty 20

## 2020-05-18 MED ORDER — ONDANSETRON HCL 4 MG/2ML IJ SOLN
INTRAMUSCULAR | Status: AC
  Filled 2020-05-18: qty 2

## 2020-05-18 SURGICAL SUPPLY — 58 items
BAG ZIPLOCK 12X15 (MISCELLANEOUS) ×2 IMPLANT
BANDAGE ESMARK 6X9 LF (GAUZE/BANDAGES/DRESSINGS) ×1 IMPLANT
BLADE SAW SGTL 11.0X1.19X90.0M (BLADE) IMPLANT
BLADE SAW SGTL 13.0X1.19X90.0M (BLADE) ×2 IMPLANT
BLADE SAW SGTL 81X20 HD (BLADE) ×2 IMPLANT
BNDG ELASTIC 6X5.8 VLCR STR LF (GAUZE/BANDAGES/DRESSINGS) ×2 IMPLANT
BNDG ESMARK 6X9 LF (GAUZE/BANDAGES/DRESSINGS) ×2
BOWL SMART MIX CTS (DISPOSABLE) IMPLANT
COVER SURGICAL LIGHT HANDLE (MISCELLANEOUS) ×2 IMPLANT
COVER WAND RF STERILE (DRAPES) IMPLANT
CUFF TOURN SGL QUICK 34 (TOURNIQUET CUFF) ×1
CUFF TRNQT CYL 34X4.125X (TOURNIQUET CUFF) ×1 IMPLANT
DERMABOND ADVANCED (GAUZE/BANDAGES/DRESSINGS) ×1
DERMABOND ADVANCED .7 DNX12 (GAUZE/BANDAGES/DRESSINGS) ×1 IMPLANT
DRAPE POUCH INSTRU U-SHP 10X18 (DRAPES) ×2 IMPLANT
DRAPE SHEET LG 3/4 BI-LAMINATE (DRAPES) ×2 IMPLANT
DRAPE U-SHAPE 47X51 STRL (DRAPES) ×2 IMPLANT
DRESSING AQUACEL AG SP 3.5X10 (GAUZE/BANDAGES/DRESSINGS) IMPLANT
DRSG ADAPTIC 3X8 NADH LF (GAUZE/BANDAGES/DRESSINGS) ×2 IMPLANT
DRSG AQUACEL AG ADV 3.5X10 (GAUZE/BANDAGES/DRESSINGS) ×2 IMPLANT
DRSG AQUACEL AG SP 3.5X10 (GAUZE/BANDAGES/DRESSINGS)
DRSG PAD ABDOMINAL 8X10 ST (GAUZE/BANDAGES/DRESSINGS) ×2 IMPLANT
DURAPREP 26ML APPLICATOR (WOUND CARE) ×2 IMPLANT
ELECT REM PT RETURN 15FT ADLT (MISCELLANEOUS) ×2 IMPLANT
FACESHIELD WRAPAROUND (MASK) ×10 IMPLANT
FACESHIELD WRAPAROUND OR TEAM (MASK) ×5 IMPLANT
GAUZE SPONGE 4X4 12PLY STRL (GAUZE/BANDAGES/DRESSINGS) ×4 IMPLANT
GLOVE ORTHO TXT STRL SZ7.5 (GLOVE) ×4 IMPLANT
GLOVE SURG LTX SZ8 (GLOVE) ×2 IMPLANT
GLOVE SURG UNDER POLY LF SZ7.5 (GLOVE) ×2 IMPLANT
GOWN STRL REUS W/TWL LRG LVL3 (GOWN DISPOSABLE) ×2 IMPLANT
HANDPIECE INTERPULSE COAX TIP (DISPOSABLE) ×1
IMMOBILIZER KNEE 20 (SOFTGOODS) ×2
IMMOBILIZER KNEE 20 THIGH 36 (SOFTGOODS) ×1 IMPLANT
KIT TURNOVER KIT A (KITS) ×2 IMPLANT
MANIFOLD NEPTUNE II (INSTRUMENTS) ×2 IMPLANT
NDL SAFETY ECLIPSE 18X1.5 (NEEDLE) ×2 IMPLANT
NEEDLE HYPO 18GX1.5 SHARP (NEEDLE) ×2
NS IRRIG 1000ML POUR BTL (IV SOLUTION) ×2 IMPLANT
PADDING CAST COTTON 6X4 STRL (CAST SUPPLIES) ×4 IMPLANT
PENCIL SMOKE EVACUATOR (MISCELLANEOUS) IMPLANT
PLATE ROT INSERT 10MM SIZE 5 (Plate) ×2 IMPLANT
PROTECTOR NERVE ULNAR (MISCELLANEOUS) ×2 IMPLANT
SET HNDPC FAN SPRY TIP SCT (DISPOSABLE) ×1 IMPLANT
SET PAD KNEE POSITIONER (MISCELLANEOUS) ×2 IMPLANT
SPONGE LAP 18X18 RF (DISPOSABLE) ×2 IMPLANT
STAPLER VISISTAT 35W (STAPLE) IMPLANT
SUCTION FRAZIER HANDLE 12FR (TUBING) ×1
SUCTION TUBE FRAZIER 12FR DISP (TUBING) ×1 IMPLANT
SUT VIC AB 1 CT1 36 (SUTURE) ×6 IMPLANT
SUT VIC AB 2-0 CT1 27 (SUTURE) ×3
SUT VIC AB 2-0 CT1 TAPERPNT 27 (SUTURE) ×3 IMPLANT
SYR 50ML LL SCALE MARK (SYRINGE) ×2 IMPLANT
TOWEL OR 17X26 10 PK STRL BLUE (TOWEL DISPOSABLE) ×4 IMPLANT
TOWER CARTRIDGE SMART MIX (DISPOSABLE) ×2 IMPLANT
TRAY FOLEY MTR SLVR 16FR STAT (SET/KITS/TRAYS/PACK) ×2 IMPLANT
WATER STERILE IRR 1000ML POUR (IV SOLUTION) ×2 IMPLANT
WRAP KNEE MAXI GEL POST OP (GAUZE/BANDAGES/DRESSINGS) ×2 IMPLANT

## 2020-05-18 NOTE — Progress Notes (Signed)
Pt still receiving vancomycin; no s/s of distress or red man syndrome; pt denies any symptoms; pt states " I feel great. " ; will continue to monitor patient.

## 2020-05-18 NOTE — Interval H&P Note (Signed)
History and Physical Interval Note:  05/18/2020 7:15 AM  Dylan [name]  has presented today for surgery, with the diagnosis of Status post left total knee with pain related to scar, saphenous neuritis.  The various methods of treatment have been discussed with the patient and family. After consideration of risks, benefits and other options for treatment, the patient has consented to  Procedure(s) with comments: Left knee excision saphenous nerve branch, neurectomy, left open scar excision with polyethlene revision (Left) - 90 mins Spinal with adductor block as a surgical intervention.  The patient's history has been reviewed, patient examined, no change in status, stable for surgery.  I have reviewed the patient's chart and labs.  Questions were answered to the patient's satisfaction.     [name]

## 2020-05-18 NOTE — Anesthesia Postprocedure Evaluation (Signed)
Anesthesia Post Note  Patient: Dylan [name]  Procedure(s) Performed: Left knee excision saphenous nerve branch, neurectomy, left open scar excision with polyethlene revision (Left Knee)     Patient location during evaluation: PACU Anesthesia Type: Spinal Level of consciousness: awake and alert Pain management: pain level controlled Vital Signs Assessment: post-procedure vital signs reviewed and stable Respiratory status: spontaneous breathing and respiratory function stable Cardiovascular status: blood pressure returned to baseline and stable Postop Assessment: spinal receding Anesthetic complications: no   No complications documented.  Last Vitals:  Vitals:   05/18/20 1015 05/18/20 1030  BP: 120/70 117/80  Pulse: 82 80  Resp: 16 14  Temp: 36.5 C   SpO2: 98% 99%    Last Pain:  Vitals:   05/18/20 1030  TempSrc:   PainSc: 0-No pain                 [name]

## 2020-05-18 NOTE — Transfer of Care (Signed)
Immediate Anesthesia Transfer of Care Note  Patient: Dylan [name]  Procedure(s) Performed: Left knee excision saphenous nerve branch, neurectomy, left open scar excision with polyethlene revision (Left Knee)  Patient Location: PACU  Anesthesia Type:Spinal  Level of Consciousness: awake, alert  and oriented  Airway & Oxygen Therapy: Patient Spontanous Breathing and Patient connected to face mask oxygen  Post-op Assessment: Report given to RN and Post -op Vital signs reviewed and stable  Post vital signs: Reviewed and stable  Last Vitals:  Vitals Value Taken Time  BP 103/62 05/18/20 0916  Temp    Pulse 85 05/18/20 0917  Resp 18 05/18/20 0917  SpO2 96 % 05/18/20 0917  Vitals shown include unvalidated device data.  Last Pain:  Vitals:   05/18/20 0626  TempSrc: Oral  PainSc:          Complications: No complications documented.

## 2020-05-18 NOTE — H&P (Signed)
TOTAL KNEE REVISION ADMISSION H&P  Patient is being admitted for left total knee excision of saphenous nerve branch, neurotomy, scar excision w/ poly exchange     Subjective:     Chief Complaint: Left knee pain s/p TKA.          HPI: Dylan [name], 69 y.o. male, has a history of pain and functional disability in the left knee due to pain since the index surgery in 2014 and has failed non-surgical conservative treatments for greater than 12 weeks to includeNSAID's and/or analgesics, supervised PT with diminished ADL's post treatment and activity modification. Onset of symptoms was gradual, starting 7 years ago with gradually worsening course since that time. The patient noted prior procedures on the knee to include arthroplasty on the left knee(s). Patient currently rates pain in the left knee(s) at 6 out of 10 with activity. Patient has night pain, worsening of pain with activity and weight bearing, pain that interferes with activities of daily living and pain with passive range of motion. Patient has evidence of previous TKA by imaging studies. There is no active infection. Risks, benefits and expectations were discussed with the patient. Risks including but not limited to the risk of anesthesia, blood clots, nerve damage, blood vessel damage, failure of the prosthesis, infection and up to and including death. Patient understand the risks, benefits and expectations and wishes to proceed with surgery.     D/C Plans: Home     Post-op Meds: No Rx given     Tranexamic Acid: To be given - IV     Decadron: Is to be given     FYI: ASA   Oxy 10     DME: Pt already has equipment     PT: [place] [address]    Patient Active Problem List   Diagnosis Date Noted  . Enlarged prostate with urinary obstruction 01/31/2020  . Lumbago with sciatica, right side 01/26/2020  . Other chronic pain 01/26/2020  . Chronic midline low back pain 11/24/2019  . Coronary artery disease  involving native heart without angina pectoris 11/24/2019  . Dyslipidemia 11/24/2019  . Elevated blood sugar 11/24/2019  . Gastroesophageal reflux disease without esophagitis 11/24/2019  . Seasonal allergies 11/24/2019  . Overweight (BMI 25.0-29.9) 10/19/2018  . Abnormal stress test   . Cervical radiculopathy 08/14/2017  . Chronic use of opiate for therapeutic purpose 07/22/2017  . Ulnar neuropathy of both upper extremities 06/19/2017  . Numbness and tingling in both hands 06/16/2017  . Encounter for long-term opiate analgesic use 06/16/2017  . 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 Routine general medical examination at a health care facility 11/11/2016  . Oropharyngeal dysphagia 03/05/2016  . Lumbar stenosis with neurogenic claudication 09/12/2015  . Hypertension 05/10/2011  . BPH (benign prostatic hyperplasia) 08/18/2010  . OA (osteoarthritis) 11/15/2009  . CARPAL TUNNEL SYNDROME 08/07/2009  . OSA on CPAP 03/22/2009  . Insomnia w/ sleep apnea 03/22/2009  . Hx of malignant melanoma 11/17/2008  . Hyperlipidemia LDL goal <100 11/17/2008  . Allergic rhinitis 11/17/2008   Past Medical History:  Diagnosis Date  . Allergy    allergic rhinitis  . Anxiety   . Arthritis    R knee, hands , back oa  . BPH (benign prostatic hyperplasia)   . Cancer ([place])    melonoma- on face, treated with excision   . Complication of anesthesia    trouble waking up with succycholine, does not have enzyme to  break down succycholine  . Coronary artery disease    nonobstructive  . Dizziness    MRI brain unremarkable 02/05/2015. resolved as of 01-27-2020  . History of melanoma 15 yrs ago   face and forehead-- previously followed by Derm in [place]  . History of migraine    none recent as of 01-27-2020  . History of ulcerative colitis    non recent flares  . Hyperlipidemia   . Hypertension   . Pseudocholinesterase deficiency   . Sleep apnea    last sleep study 10 yrs ago,CPAP-   no current cpap use     Past Surgical History:  Procedure Laterality Date  . acl and mensicus repair reconstruct Right 1980's  . ANTERIOR CERVICAL DECOMP/DISCECTOMY FUSION  10/15/2011   Procedure: ANTERIOR CERVICAL DECOMPRESSION/DISCECTOMY FUSION 1 LEVEL;  Surgeon: [name], MD;  Location: [facility];  Service: Neurosurgery;  Laterality: N/A;  Cervical four-five Anterior cervical decompression/diskectomy/fusion  . APPENDECTOMY  1969  . COLONOSCOPY  last done 2018  . ELBOW SURGERY Right 2019   tendon replacement  . HERNIA REPAIR     inguinal / abdominal - repaired as an infant   . JOINT REPLACEMENT    . KNEE ARTHROSCOPY Left last done oct 2013   x 3  . KNEE SURGERY  yrs ago   right knee  . LEFT HEART CATH AND CORONARY ANGIOGRAPHY N/A 11/14/2017   Procedure: LEFT HEART CATH AND CORONARY ANGIOGRAPHY;  Surgeon: [name], MD;  Location: [facility];  Service: Cardiovascular;  Laterality: N/A;  . LUMBAR LAMINECTOMY WITH COFLEX 2 LEVEL N/A 09/12/2015   Procedure: L3-4 L4-5 Laminectomy with coflex;  Surgeon: [name], MD;  Location: [facility];  Service: Neurosurgery;  Laterality: N/A;  L3-4 L4-5 Laminectomy with coflex  . NASAL SINUS SURGERY     multiple sinus surgery - 1980's   . TONSILLECTOMY  1969  . TOTAL KNEE ARTHROPLASTY Left 10/22/2012   Procedure: TOTAL KNEE ARTHROPLASTY;  Surgeon: [name], MD;  Location: [facility];  Service: Orthopedics;  Laterality: Left;  . TRANSURETHRAL RESECTION OF PROSTATE N/A 01/31/2020   Procedure: TRANSURETHRAL RESECTION OF THE PROSTATE (TURP);  Surgeon: [name], MD;  Location: [facility];  Service: Urology;  Laterality: N/A;  75 MIS    Current Facility-Administered Medications  Medication Dose Route Frequency Provider Last Rate Last Admin  . ceFAZolin (ANCEF) IVPB 2g/100 mL premix  2 g Intravenous On Call to OR Babish, Rodman Key, [name]      . dexamethasone (DECADRON) injection 10 mg  10 mg Intravenous  Once Babish, Matthew, [name]      . lactated ringers infusion   Intravenous Continuous Albertha Ghee, MD 10 mL/hr at 05/18/20 0620 New Bag at 05/18/20 3300  . tranexamic acid (CYKLOKAPRON) IVPB 1,000 mg  1,000 mg Intravenous To OR Babish, Matthew, [name]      . vancomycin (VANCOCIN) IVPB 1000 mg/200 mL premix  1,000 mg Intravenous Once Maurice March, [name] 100 mL/hr at 05/18/20 0645 1,000 mg at 05/18/20 0645   Allergies  Allergen Reactions  . Heparin Anaphylaxis    breathing problems  . Succinylcholine Chloride Other (See Comments)    does not wake up from anesthetic, pt. Reports that through testing that he was found  not to have the enzyme to breakdown the Succ.  . Vancomycin Hives and Other (See Comments)    Ran off a pump. So developed redman's . Running too fast  . Penicillins Rash  Did it involve swelling of the face/tongue/throat, SOB, or low BP? No Did it involve sudden or severe rash/hives, skin peeling, or any reaction on the inside of your mouth or nose? No Did you need to seek medical attention at a hospital or doctor's office? No When did it last happen?childhood If all above answers are "NO", may proceed with cephalosporin use.    Social History   Tobacco Use  . Smoking status: Never Smoker  . Smokeless tobacco: Former Systems developer    Types: Chew  Substance Use Topics  . Alcohol use: No    Family History  Problem Relation Age of Onset  . Cancer Brother        brain tumor  . Ovarian cancer Mother   . Diabetes Father   . Colon cancer Neg Hx   . Stomach cancer Neg Hx   . Rectal cancer Neg Hx   . Esophageal cancer Neg Hx   . Liver cancer Neg Hx       Review of Systems  Constitutional: Negative for chills and fever.  Respiratory: Negative for cough and shortness of breath.   Cardiovascular: Negative for chest pain.  Gastrointestinal: Negative for nausea and vomiting.  Musculoskeletal: Positive for arthralgias.     Objective:  Physical Exam Constitutional:    Appearance: He is well-developed.  HENT:   Head: Normocephalic.  Eyes:   Pupils: Pupils are equal, round, and reactive to light.  Neck:   Thyroid: No thyromegaly.   Vascular: No JVD.   Trachea: No tracheal deviation.  Cardiovascular:   Rate and Rhythm: Normal rate and regular rhythm.   Pulses: Intact distal pulses.  Pulmonary:   Effort: Pulmonary effort is normal. No respiratory distress.   Breath sounds: Normal breath sounds. No wheezing.  Abdominal:   Palpations: Abdomen is soft.   Tenderness: There is no abdominal tenderness. There is no guarding.  Musculoskeletal:   Cervical back: Neck supple.   Left knee: Swelling present. No erythema or ecchymosis. Decreased range of motion. Tenderness present.  Lymphadenopathy:   Cervical: No cervical adenopathy.  Skin:   General: Skin is warm and dry.  Neurological:   Mental Status: He is alert and oriented to person, place, and time.   Sensory: Sensory deficit (rare numbness and burning in the toes and feet) present.  Psychiatric:   Mood and Affect: Mood and affect normal.     Vital signs in last 24 hours: Temp:  [98.8 F (37.1 C)] 98.8 F (37.1 C) (03/24 0626) Pulse Rate:  [90] 90 (03/24 0626) Resp:  [20] 20 (03/24 0626) BP: (142)/(87) 142/87 (03/24 0626) SpO2:  [98 %] 98 % (03/24 0626) Weight:  [105.1 kg] 105.1 kg (03/24 0626)  Labs:  Estimated body mass index is 31.41 kg/m as calculated from the following:   Height as of 03/08/20: 6' (1.829 m).   Weight as of this encounter: 105.1 kg.  Imaging Review  Imaging Review  Plain radiographs demonstrate previous TKA of the left knee(s). The overall alignment is neutral. The bone quality appears to be good for age and reported activity level.   Assessment/Plan:  Painful left knee(s) with failed previous arthroplasty.   The patient history, physical examination, clinical judgment of the provider and imaging studies are consistent with end stage degenerative joint disease  of the left knee(s), previous total knee arthroplasty. Revision total knee arthroplasty is deemed medically necessary. The treatment options including medical management, injection therapy, arthroscopy and revision arthroplasty were discussed at length. The risks and  benefits of revision total knee arthroplasty were presented and reviewed. The risks due to aseptic loosening, infection, stiffness, patella tracking problems, thromboembolic complications and other imponderables were discussed. The patient acknowledged the explanation, agreed to proceed with the plan and consent was signed. Patient is being admitted for inpatient treatment for surgery, pain control, PT, OT, prophylactic antibiotics, VTE prophylaxis, progressive ambulation and ADL's and discharge planning.The patient is planning to be discharged home.    Griffith Citron, [name] Orthopedic Surgery [facility] [place] [phone]

## 2020-05-18 NOTE — Brief Op Note (Signed)
05/18/2020  8:48 AM  PATIENT:  Dylan [name]  70 y.o. male  PRE-OPERATIVE DIAGNOSIS:  Status post left total knee with pain related to scar, saphenous neuritis  POST-OPERATIVE DIAGNOSIS:  Status post left total knee with pain related to scar, saphenous neuritis  PROCEDURE:  Procedure(s) with comments: 1.  Left knee excision saphenous nerve branch, neurectomy,  2. left open scar excision with polyethlene revision (Left) - 90 mins Spinal with adductor block  SURGEON:  Surgeon(s) and Role:    [name], MD - Primary  PHYSICIAN ASSISTANT: Griffith Citron, [name]  ANESTHESIA:   regional and spinal  EBL:  <50 cc  BLOOD ADMINISTERED:none  DRAINS: none   LOCAL MEDICATIONS USED:  MARCAINE     SPECIMEN:  No Specimen  DISPOSITION OF SPECIMEN:  N/A  COUNTS:  YES  TOURNIQUET:   Total Tourniquet Time Documented: Thigh (Left) - 30 minutes Total: Thigh (Left) - 30 minutes   DICTATION: .Other Dictation: Dictation Number [id number]  PLAN OF CARE: Admit for overnight observation  PATIENT DISPOSITION:  PACU - hemodynamically stable.   Delay start of Pharmacological VTE agent (>24hrs) due to surgical blood loss or risk of bleeding: no

## 2020-05-18 NOTE — Anesthesia Procedure Notes (Signed)
Spinal  Patient location during procedure: OR Start time: 05/18/2020 7:34 AM End time: 05/18/2020 7:43 AM Reason for block: surgical anesthesia Staffing Performed: anesthesiologist  Anesthesiologist: Duane Boston, MD Preanesthetic Checklist Completed: patient identified, IV checked, risks and benefits discussed, surgical consent, monitors and equipment checked, pre-op evaluation and timeout performed Spinal Block Patient position: sitting Prep: DuraPrep Patient monitoring: cardiac monitor, continuous pulse ox and blood pressure Approach: midline Location: L2-3 Injection technique: single-shot Needle Needle type: Pencan  Needle gauge: 24 G Needle length: 9 cm Assessment Events: CSF return Additional Notes Functioning IV was confirmed and monitors were applied. Sterile prep and drape, including hand hygiene and sterile gloves were used. The patient was positioned and the spine was prepped. The skin was anesthetized with lidocaine.  Free flow of clear CSF was obtained prior to injecting local anesthetic into the CSF.  The spinal needle aspirated freely following injection.  The needle was carefully withdrawn.  The patient tolerated the procedure well. Difficult placement.

## 2020-05-18 NOTE — Anesthesia Procedure Notes (Signed)
Anesthesia Regional Block: Adductor canal block   Pre-Anesthetic Checklist: ,, timeout performed, Correct Patient, Correct Site, Correct Laterality, Correct Procedure, Correct Position, site marked, Risks and benefits discussed,  Surgical consent,  Pre-op evaluation,  At surgeon's request and post-op pain management  Laterality: Left  Prep: chloraprep       Needles:  Injection technique: Single-shot  Needle Type: Stimulator Needle - 80     Needle Length: 10cm  Needle Gauge: 21     Additional Needles:   Narrative:  Start time: 05/18/2020 6:57 AM End time: 05/18/2020 7:07 AM Injection made incrementally with aspirations every 5 mL.  Performed by: Personally

## 2020-05-18 NOTE — Anesthesia Procedure Notes (Signed)
Procedure Name: MAC Date/Time: 05/18/2020 7:33 AM Performed by: Niel Hummer, CRNA Pre-anesthesia Checklist: Patient identified, Emergency Drugs available, Suction available and Patient being monitored Oxygen Delivery Method: Simple face mask

## 2020-05-18 NOTE — Evaluation (Signed)
Physical Therapy Evaluation Patient Details Name: WINNIE BARSKY MRN: [id number] DOB: Mar 24, 1950 Today's Date: 05/18/2020   History of Present Illness  Patient is a 70 y.o. male s/p Lt knee excision saphenous nerve branch, neurectomy, left open scar excision with polyethlene revision on 05/18/2020 with PMH signifincat for HTN, HLD, CAD, OA, anxiety, Lt TKA (2021), lumbar laminectomy, ACL and meniscal repair (1980's), ACDF (2013).  Clinical Impression  Pt is a 70y.o. male s/p Lt knee excision saphenous nerve branch with open scar excision and Lt polyethlene revision POD 0. Pt reports that he is independent with mobility at baseline. Pt required MIN guard with cues for safe hand placement for sit to stand transfer. Pt required MIN assist progressing to MIN guard for safety with ambulation 36ft and verbal cues for RW management and step to gait pattern with no LOB. PT reviewed therapeutic intervention for promotion of DVT prevention, pt demonstrated understanding. Pt will have assistance from his daughter upon discharge. Pt will benefit from skilled PT to increase independence and safety with mobility. Acute therapy to follow up during stay to progress functional mobility as able to ensure safe discharge home.       Follow Up Recommendations Outpatient PT;Follow surgeon's recommendation for DC plan and follow-up therapies    Equipment Recommendations  None recommended by PT (pt owns RW)    Recommendations for Other Services       Precautions / Restrictions Precautions Precautions: Fall Restrictions Weight Bearing Restrictions: No Other Position/Activity Restrictions: WBAT      Mobility  Bed Mobility Overal bed mobility: Needs Assistance Bed Mobility: Supine to Sit     Supine to sit: Supervision     General bed mobility comments: Pt with use of B UEs to scoot to EOB with supervision for safety    Transfers Overall transfer level: Needs assistance Equipment used: Rolling walker  (2 wheeled) Transfers: Sit to/from Stand Sit to Stand: Min guard         General transfer comment: MIN guard for safety, pt demonstrated safe hand placement without cuing from therapist  Ambulation/Gait Ambulation/Gait assistance: Min assist;Min guard Gait Distance (Feet): 70 Feet Assistive device: Rolling walker (2 wheeled) Gait Pattern/deviations: Step-to pattern;Decreased stride length;Decreased weight shift to left Gait velocity: decr   General Gait Details: Pt performed pre-gait marching with use of B UEs on RW with no knee buckling. MIN assist-MIN guard for safety with cues for step to gait pattern with no LOB.  Stairs            Wheelchair Mobility    Modified Rankin (Stroke Patients Only)       Balance Overall balance assessment: Needs assistance Sitting-balance support: Feet supported Sitting balance-Leahy Scale: Good     Standing balance support: Bilateral upper extremity supported;During functional activity Standing balance-Leahy Scale: Poor Standing balance comment: use of RW for standing balance                             Pertinent Vitals/Pain Pain Assessment: 0-10 Pain Score: 6  Pain Location: Lt knee Pain Descriptors / Indicators: Sore Pain Intervention(s): Limited activity within patient's tolerance;Monitored during session;Repositioned;Ice applied    Home Living Family/patient expects to be discharged to:: Private residence Living Arrangements: Children Available Help at Discharge: Family Type of Home: House Home Access: Stairs to enter Entrance Stairs-Rails: Left Entrance Stairs-Number of Steps: 5 Home Layout: Two level;Able to live on main level with bedroom/bathroom Home Equipment: Gilford Rile - 2  wheels;Cane - single point;Cane - quad;Bedside commode;Shower seat Additional Comments: pt will have assist from his daughter, who is a Marine scientist, at home    Prior Function Level of Independence: Independent         Comments: use of  cane twice 2/2 increased pain in Lt knee     Hand Dominance   Dominant Hand: Right    Extremity/Trunk Assessment   Upper Extremity Assessment Upper Extremity Assessment: Overall WFL for tasks assessed    Lower Extremity Assessment Lower Extremity Assessment: LLE deficits/detail LLE Deficits / Details: pt with 4+/5 B dorsi/plantar flexion strength. Pt able to complete full SLR wiht no extensor lag. LLE Sensation: WNL LLE Coordination: WNL    Cervical / Trunk Assessment Cervical / Trunk Assessment: Normal  Communication   Communication: No difficulties  Cognition Arousal/Alertness: Awake/alert Behavior During Therapy: WFL for tasks assessed/performed Overall Cognitive Status: Within Functional Limits for tasks assessed                                        General Comments      Exercises Total Joint Exercises Ankle Circles/Pumps: AROM;Both;20 reps;Seated   Assessment/Plan    PT Assessment Patient needs continued PT services  PT Problem List Decreased strength;Decreased range of motion;Decreased activity tolerance;Decreased balance;Decreased mobility;Decreased knowledge of use of DME;Pain       PT Treatment Interventions DME instruction;Gait training;Stair training;Functional mobility training;Therapeutic activities;Therapeutic exercise;Balance training;Patient/family education    PT Goals (Current goals can be found in the Care Plan section)  Acute Rehab PT Goals Patient Stated Goal: get back to golfing PT Goal Formulation: With patient/family Time For Goal Achievement: 05/25/20 Potential to Achieve Goals: Good    Frequency 7X/week   Barriers to discharge        Co-evaluation               AM-PAC PT "6 Clicks" Mobility  Outcome Measure Help needed turning from your back to your side while in a flat bed without using bedrails?: None Help needed moving from lying on your back to sitting on the side of a flat bed without using  bedrails?: None Help needed moving to and from a bed to a chair (including a wheelchair)?: A Little Help needed standing up from a chair using your arms (e.g., wheelchair or bedside chair)?: A Little Help needed to walk in hospital room?: A Little Help needed climbing 3-5 steps with a railing? : A Little 6 Click Score: 20    End of Session Equipment Utilized During Treatment: Gait belt Activity Tolerance: Patient tolerated treatment well Patient left: in chair;with call bell/phone within reach;with chair alarm set;with family/visitor present Nurse Communication: Mobility status PT Visit Diagnosis: Unsteadiness on feet (R26.81);Muscle weakness (generalized) (M62.81);Pain Pain - Right/Left: Left Pain - part of body: Knee    Time: 1232-1253 PT Time Calculation (min) (ACUTE ONLY): 21 min   Charges:              Elna Breslow, SPT  Acute rehab    Nguyen Butler 05/18/2020, 1:05 PM

## 2020-05-18 NOTE — Op Note (Signed)
NAMESHERWOOD, CASTILLA MEDICAL RECORD NO: [id number] ACCOUNT NO: [id number] DATE OF BIRTH: January 11, 1951 FACILITY: [facility] LOCATION: [facility] PHYSICIAN: Pietro Cassis. Alvan Dame, MD  Operative Report   DATE OF PROCEDURE: 05/18/2020   PREOPERATIVE DIAGNOSIS:  Painful left total knee arthroplasty with concern of saphenous nerve branch pain, saphenous neuritis versus intra-articular scarring.  POSTOPERATIVE DIAGNOSIS:  Painful left total knee arthroplasty with concern of saphenous nerve branch pain, saphenous neuritis versus intra-articular scarring.  PROCEDURES:   1.  Excision of left knee saphenous nerve branches, saphenous neurectomy. 2.  Open excisional debridement of the left knee.  Excisional debridement was done with a knife including skin, subcutaneous tissue, nonviable scar tissue and bone within the knee.  The size of the incision and excision was over a 6-8 inch incision. 3.  A single component polyethylene revision for a size 5 femur, 10 mm posterior stabilized insert for the Sigma  knee system with DePuy.  FINDINGS:  Please see dictated operative note for details of the findings.  SURGEON:  Dr. [name].  ASSISTANT:  Griffith Citron, [name].  Note that Ms. Nehemiah Settle was present for the entirety of the case for preoperative positioning, perioperative management of the operative extremity, general facilitation of the case and primary wound closure.  ANESTHESIA:  Regional plus spinal block.  SPECIMENS:  None.  COMPLICATIONS:  None.  ESTIMATED BLOOD LOSS:  Minimal.  TOURNIQUET TIME:  Up for 30 minutes at 250 mmHg.  INDICATIONS:  The patient is a pleasant 70 year old male who has been seen and evaluated in the office following a total knee replacement performed by one of my partners.  We have been working through the challenges of postoperative pain with his left  knee.  Clinical exam had revealed concerns regarding sensitivity over the medial side of the knee as well as mechanical based  symptoms.  Workup for infection or loosening was negative with bone scan and serum C-reactive protein and sedimentation rates.   After a lengthy discussion regarding the persistence of his pain, we had discussed options including those outlined above.  Given the fact that I told him that risks included the persistence of pain despite our attempts to try and improve his condition  as well as standard risk for infection, DVT, component failure and need for future surgeries.  He wished and at this point has elected to proceed with revision surgery as outlined.  Consent was obtained for the benefit of attempted pain relief.  DESCRIPTION OF PROCEDURE:  The patient was brought to the operative theater.  Once adequate anesthesia, preoperative antibiotics, Ancef and vancomycin administered due to MRSA screening positivity.  He was positioned supine with a left thigh tourniquet  placed.  The left lower extremity was prepped and draped in sterile fashion.  A timeout was performed identifying the patient, planned procedure, and extremity.  The leg was exsanguinated and tourniquet elevated to 250 mmHg.  His old incision was  identified.  I utilized this incision and excised a portion of it.  It was extended slightly proximal and slightly distal for exposure purposes.  Soft tissue planes were created.  Attention was first directed to the medial aspect of his knee.  He did not have a significant amount of adipose tissue medially with significant scarring in this area.  I did excise a layer down to the capsule in the subcutaneous layer that would likely  contain saphenous nerve branches.  The intent at this point is to create numbness in this area intentionally based on our  discussion preoperatively.  Once I completed in this area in the infrapatellar region that I felt that I had gotten it back to the  safest dissection I could due to the subcutaneous layer of his skin, we then proceeded to the joint procedure.  The  knee was flexed and an arthrotomy made.  We encountered clear synovial fluid and overall arching seen was in his knee was very tight with a lot of scar tissue in the knee.  I exposed the knee in standard fashion revision setting, I did debride scar  along the medial gutter into the suprapatellar pouch.  As I was able to expose the knee more I was able to get around the patella and have it partially everted enough to remove the scar tissue around it.  Though we did not have scar tissue that would be  consistent with findings of that of a patellar clunk and as well as his clinical exam not presented that there was abundant scar around and over top of the patella.  I debrided this all back to a stable level to the tendinous level.  The patellar button  itself was stable.  After I worked around the lateral border of the patella we were able to evert it more.  I was able to remove some of the bone from the lateral facet.  I then removed scar tissue and bands along the lateral gutter of the knee.  With  this now performed, I was able to flex the knee.  We removed the old polyethylene.  This allowed for further debridement of the medial and posterior medial aspect of the knee where some of his symptoms were.  We carried this debridement into the  posterior aspect of the knee and laterally.  Given the fact that he had a preoperative flexion contracture and he already had a size 10 mm insert in place there was no place to go further to reduce the size of the poly.  I selected the 10 mm poly and we  updated his poly at this point.  As new poly was placed in the knee.  We then irrigated the knee with 1 liter normal saline solution.  The tourniquet was let down without significant hemostasis required.  At this point, the extensor mechanism was  reapproximated with the knee in flexion using #1 Vicryl and #1 Stratafix suture.  The remainder of the wound was closed with 2-0 Vicryl and a running Monocryl stitch.  The knee  was clean, dry and dressed sterilely at the end of the case with Dermabond  and an Aquacel dressing.  He was brought to the recovery room in stable condition, tolerating the procedure well.  Postoperatively, we will have him observed overnight for antibiotics and to work on pain control.  He was then subsequently discharged with physical therapy set up to work on motion and strength.   Elián.Darby D: 05/18/2020 8:57:33 am T: 05/18/2020 9:46:00 am  JOB: [id number]/ [id number]

## 2020-05-18 NOTE — Discharge Instructions (Signed)
INSTRUCTIONS AFTER KNEE SURGERY  o Remove items at home which could result in a fall. This includes throw rugs or furniture in walking pathways o ICE to the affected joint every three hours while awake for 30 minutes at a time, for at least the first 3-5 days, and then as needed for pain and swelling.  Continue to use ice for pain and swelling. You may notice swelling that will progress down to the foot and ankle.  This is normal after surgery.  Elevate your leg when you are not up walking on it.   o Continue to use the breathing machine you got in the hospital (incentive spirometer) which will help keep your temperature down.  It is common for your temperature to cycle up and down following surgery, especially at night when you are not up moving around and exerting yourself.  The breathing machine keeps your lungs expanded and your temperature down.   DIET:  As you were doing prior to hospitalization, we recommend a well-balanced diet.  DRESSING / WOUND CARE / SHOWERING  Keep the surgical dressing until follow up.  The dressing is water proof, so you can shower without any extra covering.  IF THE DRESSING FALLS OFF or the wound gets wet inside, change the dressing with sterile gauze.  Please use good hand washing techniques before changing the dressing.  Do not use any lotions or creams on the incision until instructed by your surgeon.    ACTIVITY  o Increase activity slowly as tolerated, but follow the weight bearing instructions below.   o No driving for 6 weeks or until further direction given by your physician.  You cannot drive while taking narcotics.  o No lifting or carrying greater than 10 lbs. until further directed by your surgeon. o Avoid periods of inactivity such as sitting longer than an hour when not asleep. This helps prevent blood clots.  o You may return to work once you are authorized by your doctor.     WEIGHT BEARING   Weight bearing as tolerated with assist device  (walker, cane, etc) as directed, use it as long as suggested by your surgeon or therapist, typically at least 4-6 weeks.   EXERCISES  Results after joint replacement surgery are often greatly improved when you follow the exercise, range of motion and muscle strengthening exercises prescribed by your doctor. Safety measures are also important to protect the joint from further injury. Any time any of these exercises cause you to have increased pain or swelling, decrease what you are doing until you are comfortable again and then slowly increase them. If you have problems or questions, call your caregiver or physical therapist for advice.   Rehabilitation is important following a joint replacement. After just a few days of immobilization, the muscles of the leg can become weakened and shrink (atrophy).  These exercises are designed to build up the tone and strength of the thigh and leg muscles and to improve motion. Often times heat used for twenty to thirty minutes before working out will loosen up your tissues and help with improving the range of motion but do not use heat for the first two weeks following surgery (sometimes heat can increase post-operative swelling).   These exercises can be done on a training (exercise) mat, on the floor, on a table or on a bed. Use whatever works the best and is most comfortable for you.    Use music or television while you are exercising so that the  exercises are a pleasant break in your day. This will make your life better with the exercises acting as a break in your routine that you can look forward to.   Perform all exercises about fifteen times, three times per day or as directed.  You should exercise both the operative leg and the other leg as well.  Exercises include:   . Quad Sets - Tighten up the muscle on the front of the thigh (Quad) and hold for 5-10 seconds.   . Straight Leg Raises - With your knee straight (if you were given a brace, keep it on), lift  the leg to 60 degrees, hold for 3 seconds, and slowly lower the leg.  Perform this exercise against resistance later as your leg gets stronger.  . Leg Slides: Lying on your back, slowly slide your foot toward your buttocks, bending your knee up off the floor (only go as far as is comfortable). Then slowly slide your foot back down until your leg is flat on the floor again.  Glenard Haring Wings: Lying on your back spread your legs to the side as far apart as you can without causing discomfort.  . Hamstring Strength:  Lying on your back, push your heel against the floor with your leg straight by tightening up the muscles of your buttocks.  Repeat, but this time bend your knee to a comfortable angle, and push your heel against the floor.  You may put a pillow under the heel to make it more comfortable if necessary.   A rehabilitation program following joint replacement surgery can speed recovery and prevent re-injury in the future due to weakened muscles. Contact your doctor or a physical therapist for more information on knee rehabilitation.    CONSTIPATION  Constipation is defined medically as fewer than three stools per week and severe constipation as less than one stool per week.  Even if you have a regular bowel pattern at home, your normal regimen is likely to be disrupted due to multiple reasons following surgery.  Combination of anesthesia, postoperative narcotics, change in appetite and fluid intake all can affect your bowels.   YOU MUST use at least one of the following options; they are listed in order of increasing strength to get the job done.  They are all available over the counter, and you may need to use some, POSSIBLY even all of these options:    Drink plenty of fluids (prune juice may be helpful) and high fiber foods Colace 100 mg by mouth twice a day  Senokot for constipation as directed and as needed Dulcolax (bisacodyl), take with full glass of water  Miralax (polyethylene glycol) once  or twice a day as needed.  If you have tried all these things and are unable to have a bowel movement in the first 3-4 days after surgery call either your surgeon or your primary doctor.    If you experience loose stools or diarrhea, hold the medications until you stool forms back up.  If your symptoms do not get better within 1 week or if they get worse, check with your doctor.  If you experience "the worst abdominal pain ever" or develop nausea or vomiting, please contact the office immediately for further recommendations for treatment.   ITCHING:  If you experience itching with your medications, try taking only a single pain pill, or even half a pain pill at a time.  You can also use Benadryl over the counter for itching or also to help  with sleep.   TED HOSE STOCKINGS:  Use stockings on both legs until for at least 2 weeks or as directed by physician office. They may be removed at night for sleeping.  MEDICATIONS:  See your medication summary on the "After Visit Summary" that nursing will review with you.  You may have some home medications which will be placed on hold until you complete the course of blood thinner medication.  It is important for you to complete the blood thinner medication as prescribed.  PRECAUTIONS:  If you experience chest pain or shortness of breath - call 911 immediately for transfer to the hospital emergency department.   If you develop a fever greater that 101 F, purulent drainage from wound, increased redness or drainage from wound, foul odor from the wound/dressing, or calf pain - CONTACT YOUR SURGEON.                                                   FOLLOW-UP APPOINTMENTS:  If you do not already have a post-op appointment, please call the office for an appointment to be seen by your surgeon.  Guidelines for how soon to be seen are listed in your "After Visit Summary", but are typically between 1-4 weeks after surgery.  OTHER INSTRUCTIONS:   Knee Replacement:  Do  not place pillow under knee, focus on keeping the knee straight while resting. CPM instructions: 0-90 degrees, 2 hours in the morning, 2 hours in the afternoon, and 2 hours in the evening. Place foam block, curve side up under heel at all times except when in CPM or when walking.  DO NOT modify, tear, cut, or change the foam block in any way.  POST-OPERATIVE OPIOID TAPER INSTRUCTIONS: . It is important to wean off of your opioid medication as soon as possible. If you do not need pain medication after your surgery it is ok to stop day one. Marland Kitchen Opioids include: o Codeine, Hydrocodone(Norco, Vicodin), Oxycodone(Percocet, oxycontin) and hydromorphone amongst others.  . Long term and even short term use of opiods can cause: o Increased pain response o Dependence o Constipation o Depression o Respiratory depression o And more.  . Withdrawal symptoms can include o Flu like symptoms o Nausea, vomiting o And more . Techniques to manage these symptoms o Hydrate well o Eat regular healthy meals o Stay active o Use relaxation techniques(deep breathing, meditating, yoga) . Do Not substitute Alcohol to help with tapering . If you have been on opioids for less than two weeks and do not have pain than it is ok to stop all together.  . Plan to wean off of opioids o This plan should start within one week post op of your joint replacement. o Maintain the same interval or time between taking each dose and first decrease the dose.  o Cut the total daily intake of opioids by one tablet each day o Next start to increase the time between doses. o The last dose that should be eliminated is the evening dose.     MAKE SURE YOU:  . Understand these instructions.  . Get help right away if you are not doing well or get worse.    Thank you for letting us be a part of your medical care team.  It is a privilege we respect greatly.  We hope these instructions will help  you stay on track for a fast and full  recovery!

## 2020-05-19 ENCOUNTER — Encounter (HOSPITAL_COMMUNITY): Payer: Self-pay | Admitting: Orthopedic Surgery

## 2020-05-19 LAB — CBC
HCT: 32.2 % — ABNORMAL LOW (ref 39.0–52.0)
Hemoglobin: 10.8 g/dL — ABNORMAL LOW (ref 13.0–17.0)
MCH: 29.9 pg (ref 26.0–34.0)
MCHC: 33.5 g/dL (ref 30.0–36.0)
MCV: 89.2 fL (ref 80.0–100.0)
Platelets: 276 10*3/uL (ref 150–400)
RBC: 3.61 MIL/uL — ABNORMAL LOW (ref 4.22–5.81)
RDW: 13.3 % (ref 11.5–15.5)
WBC: 14.5 10*3/uL — ABNORMAL HIGH (ref 4.0–10.5)
nRBC: 0 % (ref 0.0–0.2)

## 2020-05-19 LAB — BASIC METABOLIC PANEL
Anion gap: 7 (ref 5–15)
BUN: 19 mg/dL (ref 8–23)
CO2: 24 mmol/L (ref 22–32)
Calcium: 8.7 mg/dL — ABNORMAL LOW (ref 8.9–10.3)
Chloride: 106 mmol/L (ref 98–111)
Creatinine, Ser: 0.76 mg/dL (ref 0.61–1.24)
GFR, Estimated: >60 mL/min (ref >60)
Glucose, Bld: 139 mg/dL — ABNORMAL HIGH (ref 70–99)
Potassium: 3.9 mmol/L (ref 3.5–5.1)
Sodium: 137 mmol/L (ref 135–145)

## 2020-05-19 MED ORDER — METHOCARBAMOL 500 MG PO TABS: 500.0000 mg | ORAL_TABLET | Freq: Four times a day (QID) | ORAL | 0 refills | Status: DC | PRN

## 2020-05-19 MED ORDER — CELECOXIB 200 MG PO CAPS: 200.0000 mg | ORAL_CAPSULE | Freq: Two times a day (BID) | ORAL | 0 refills | Status: DC

## 2020-05-19 MED ORDER — POLYETHYLENE GLYCOL 3350 17 G PO PACK: 17.0000 g | PACK | Freq: Every day | ORAL | 0 refills | Status: DC | PRN

## 2020-05-19 MED ORDER — ASPIRIN 81 MG PO CHEW: 81.0000 mg | CHEWABLE_TABLET | Freq: Two times a day (BID) | ORAL | 0 refills | Status: AC

## 2020-05-19 MED ORDER — OXYCODONE HCL 5 MG PO TABS: 5.0000 mg | ORAL_TABLET | Freq: Four times a day (QID) | ORAL | 0 refills | Status: DC | PRN

## 2020-05-19 MED ORDER — DOCUSATE SODIUM 100 MG PO CAPS: 100.0000 mg | ORAL_CAPSULE | Freq: Two times a day (BID) | ORAL | 0 refills | Status: DC

## 2020-05-19 NOTE — Plan of Care (Signed)

## 2020-05-19 NOTE — Progress Notes (Signed)
Physical Therapy Treatment Patient Details Name: Dylan [name] MRN: [id number] DOB: 13-Jul-1950 Today's Date: 05/19/2020    History of Present Illness Patient is a 69 y.o. male s/p Lt knee excision saphenous nerve branch, neurectomy, left open scar excision with polyethlene revision on 05/18/2020 with PMH signifincat for HTN, HLD, CAD, OA, anxiety, Lt TKA (2021), lumbar laminectomy, ACL and meniscal repair (1980's), ACDF (2013).    PT Comments    Pt meeting goals, making excellent progress. His dtrs will be his caregivers and both are RNs. Pt is ready to d/c from PT standpoint. See below for mobility details.   Follow Up Recommendations  Outpatient PT;Follow surgeon's recommendation for DC plan and follow-up therapies     Equipment Recommendations  None recommended by PT (pt owns RW)    Recommendations for Other Services       Precautions / Restrictions Precautions Precautions: Fall Restrictions Weight Bearing Restrictions: No Other Position/Activity Restrictions: WBAT    Mobility  Bed Mobility               General bed mobility comments: in chair    Transfers Overall transfer level: Needs assistance Equipment used: Rolling walker (2 wheeled) Transfers: Sit to/from Stand Sit to Stand: Supervision         General transfer comment: cues for hand placement, LLE position, overall safety  Ambulation/Gait Ambulation/Gait assistance: Supervision Gait Distance (Feet): 260 Feet Assistive device: Rolling walker (2 wheeled) Gait Pattern/deviations: Decreased stride length;Step-to pattern;Step-through pattern     General Gait Details: cues for progression, no knee buckl;ing or instabiity with step through pattern, improved wt shift to LLE   Stairs Stairs: Yes Stairs assistance: Min guard Stair Management: Two rails;Step to pattern;Forwards Number of Stairs: 3 General stair comments: cues for sequence   Wheelchair Mobility    Modified Rankin (Stroke  Patients Only)       Balance Overall balance assessment: Needs assistance Sitting-balance support: Feet supported Sitting balance-Leahy Scale: Good     Standing balance support: Bilateral upper extremity supported;During functional activity Standing balance-Leahy Scale: Fair                              Cognition Arousal/Alertness: Awake/alert Behavior During Therapy: WFL for tasks assessed/performed Overall Cognitive Status: Within Functional Limits for tasks assessed                                        Exercises Total Joint Exercises Ankle Circles/Pumps: AROM;Both;10 reps Quad Sets: AROM;Both;10 reps Short Arc Quad: AROM;Left;10 reps Heel Slides: AROM;AAROM;Left;10 reps Hip ABduction/ADduction: AROM;Left;10 reps Straight Leg Raises: AROM;Left;10 reps Goniometric ROM: ~ 5 to 85 degrees L knee flexion AAROM    General Comments        Pertinent Vitals/Pain Pain Assessment: 0-10 Pain Score: 3  Pain Location: Lt knee Pain Descriptors / Indicators: Sore Pain Intervention(s): Limited activity within patient's tolerance;Monitored during session;Premedicated before session    Home Living                      Prior Function            PT Goals (current goals can now be found in the care plan section) Acute Rehab PT Goals Patient Stated Goal: get back to golfing PT Goal Formulation: With patient/family Time For Goal Achievement: 05/25/20 Potential to Achieve Goals: Good  Progress towards PT goals: Progressing toward goals    Frequency    7X/week      PT Plan      Co-evaluation              AM-PAC PT "6 Clicks" Mobility   Outcome Measure  Help needed turning from your back to your side while in a flat bed without using bedrails?: None Help needed moving from lying on your back to sitting on the side of a flat bed without using bedrails?: None Help needed moving to and from a bed to a chair (including a  wheelchair)?: A Little Help needed standing up from a chair using your arms (e.g., wheelchair or bedside chair)?: A Little Help needed to walk in hospital room?: A Little Help needed climbing 3-5 steps with a railing? : A Little 6 Click Score: 20    End of Session Equipment Utilized During Treatment: Gait belt Activity Tolerance: Patient tolerated treatment well Patient left: in chair;with call bell/phone within reach;with chair alarm set;with family/visitor present Nurse Communication: Mobility status PT Visit Diagnosis: Unsteadiness on feet (R26.81);Muscle weakness (generalized) (M62.81);Pain Pain - Right/Left: Left Pain - part of body: Knee     Time: 1023-1039 PT Time Calculation (min) (ACUTE ONLY): 16 min  Charges:  $Gait Training: 8-22 mins                     Baxter Flattery, PT  Acute Rehab Dept ([place]) [phone] Pager [phone]  05/19/2020    [facility] 05/19/2020, 10:46 AM

## 2020-05-19 NOTE — Progress Notes (Signed)
Subjective: 1 Day Post-Op Procedure(s) (LRB): Left knee excision saphenous nerve branch, neurectomy, left open scar excision with polyethlene revision (Left) Patient reports pain as mild.   Patient seen in rounds by Dr. Alvan Dame. Patient is well, and has had no acute complaints or problems other than discomfort in the knee. No acute events overnight. Voiding without difficulty, positive flatus. Ambulated 70+ feet with PT yesterday.  We will continue therapy today.   Objective: Vital signs in last 24 hours: Temp:  [97.4 F (36.3 C)-98.7 F (37.1 C)] 97.8 F (36.6 C) (03/25 0527) Pulse Rate:  [75-100] 85 (03/25 0527) Resp:  [14-21] 16 (03/25 0527) BP: (100-145)/(62-85) 142/83 (03/25 0527) SpO2:  [96 %-100 %] 100 % (03/25 0527) Weight:  [104.8 kg] 104.8 kg (03/24 1109)  Intake/Output from previous day:  Intake/Output Summary (Last 24 hours) at 05/19/2020 0801 Last data filed at 05/19/2020 0600 Gross per 24 hour  Intake 2971.05 ml  Output 1700 ml  Net 1271.05 ml     Intake/Output this shift: No intake/output data recorded.  Labs: Recent Labs    05/19/20 0243  HGB 10.8*   Recent Labs    05/19/20 0243  WBC 14.5*  RBC 3.61*  HCT 32.2*  PLT 276   Recent Labs    05/19/20 0243  NA 137  K 3.9  CL 106  CO2 24  BUN 19  CREATININE 0.76  GLUCOSE 139*  CALCIUM 8.7*   No results for input(s): LABPT, INR in the last 72 hours.  Exam: General - Patient is Alert and Oriented Extremity - Neurologically intact Sensation intact distally Intact pulses distally Dorsiflexion/Plantar flexion intact Dressing - dressing C/D/I Motor Function - intact, moving foot and toes well on exam.   Past Medical History:  Diagnosis Date  . Allergy    allergic rhinitis  . Anxiety   . Arthritis    R knee, hands , back oa  . BPH (benign prostatic hyperplasia)   . Cancer ([place])    melonoma- on face, treated with excision   . Complication of anesthesia    trouble waking up with  succycholine, does not have enzyme to break down succycholine  . Coronary artery disease    nonobstructive  . Dizziness    MRI brain unremarkable 02/05/2015. resolved as of 01-27-2020  . History of melanoma 15 yrs ago   face and forehead-- previously followed by Derm in [place]  . History of migraine    none recent as of 01-27-2020  . History of ulcerative colitis    non recent flares  . Hyperlipidemia   . Hypertension   . Pseudocholinesterase deficiency   . Sleep apnea    last sleep study 10 yrs ago,CPAP-  no current cpap use     Assessment/Plan: 1 Day Post-Op Procedure(s) (LRB): Left knee excision saphenous nerve branch, neurectomy, left open scar excision with polyethlene revision (Left) Active Problems:   S/P revision of total knee, left  Estimated body mass index is 31.33 kg/m as calculated from the following:   Height as of this encounter: 6' (1.829 m).   Weight as of this encounter: 104.8 kg. Advance diet Up with therapy D/C IV fluids  DVT Prophylaxis - Aspirin Weight bearing as tolerated.  Plan is to go Home after hospital stay. Plan for discharge today after 1-2 sessions of PT as long as he is meeting his goals. Patient is scheduled for OPPT Monday. Follow up in the office in 2 weeks.   He takes Percocet 10 q6h  at home at baseline. We will add an additional 5mg  for post-operative pain.   Griffith Citron, [name] Orthopedic Surgery [phone] 05/19/2020, 8:01 AM

## 2020-05-19 NOTE — Progress Notes (Signed)
Patient informed us that he is out of his regular pain medication at home, and cannot get a refill until 3/30. We will send in 5 days worth of pain medication for post-operative pain.

## 2020-05-19 NOTE — TOC Transition Note (Signed)
Transition of Care [facility]) - CM/SW Discharge Note   Patient Details  Name: Dylan [name] MRN: [id number] Date of Birth: 09-Feb-1951  Transition of Care [facility]) CM/SW Contact:  Lennart Pall, LCSW Phone Number: 05/19/2020, 10:58 AM   Clinical Narrative:    Met briefly with pt and confirming he has all needed DME at home.  Plan for OPPT at [facility].  No further TOC needs.   Final next level of care: OP Rehab Barriers to Discharge: No Barriers Identified   Patient Goals and CMS Choice Patient states their goals for this hospitalization and ongoing recovery are:: return home      Discharge Placement                       Discharge Plan and Services                DME Arranged: N/A DME Agency: NA                  Social Determinants of Health (SDOH) Interventions     Readmission Risk Interventions Readmission Risk Prevention Plan 05/19/2020  Post Dischage Appt Complete  Medication Screening Complete  Transportation Screening Complete  Some recent data might be hidden

## 2020-05-22 ENCOUNTER — Other Ambulatory Visit: Payer: Self-pay

## 2020-05-22 DIAGNOSIS — M25662 Stiffness of left knee, not elsewhere classified: Secondary | ICD-10-CM | POA: Diagnosis not present

## 2020-05-22 DIAGNOSIS — M25562 Pain in left knee: Secondary | ICD-10-CM | POA: Diagnosis not present

## 2020-05-22 NOTE — Patient Outreach (Signed)
[facility]) Care Management  05/22/2020  Dylan [name] 03-10-1950 [id number]     Transition of Care Referral  Referral Date: 05/22/2020 Referral Source: [facility] Discharge Report Date of Discharge: 05/19/2020 Facility: [place]: [facility]    Referral received. Transition of care calls being completed via EMMI-automated calls. Patient does not have [facility] provider.     Plan: RN CM will close referral.    Enzo Montgomery, RN,BSN,CCM [place] Management Telephonic Care Management Coordinator Direct Phone: [phone] Toll Free: [phone] Fax: [phone]

## 2020-05-24 ENCOUNTER — Ambulatory Visit: Payer: Medicare HMO | Admitting: Family Medicine

## 2020-05-24 ENCOUNTER — Other Ambulatory Visit: Payer: Self-pay | Admitting: Family Medicine

## 2020-05-25 ENCOUNTER — Ambulatory Visit: Payer: Self-pay | Admitting: Internal Medicine

## 2020-05-25 DIAGNOSIS — M25662 Stiffness of left knee, not elsewhere classified: Secondary | ICD-10-CM | POA: Diagnosis not present

## 2020-05-28 NOTE — Discharge Summary (Signed)
Physician Discharge Summary   Patient ID: Dylan [name] MRN: [id number] DOB/AGE: [birth year] 69 y.o.  Admit date: 05/18/2020 Discharge date: 05/19/2020  Primary Diagnosis: Painful left total knee arthroplasty with concern of saphenous nerve branch pain, saphenous neuritis versus intra-articular scarring.  Admission Diagnoses:  Past Medical History:  Diagnosis Date  . Allergy    allergic rhinitis  . Anxiety   . Arthritis    R knee, hands , back oa  . BPH (benign prostatic hyperplasia)   . Cancer ([place])    melonoma- on face, treated with excision   . Complication of anesthesia    trouble waking up with succycholine, does not have enzyme to break down succycholine  . Coronary artery disease    nonobstructive  . Dizziness    MRI brain unremarkable 02/05/2015. resolved as of 01-27-2020  . History of melanoma 15 yrs ago   face and forehead-- previously followed by Derm in [place]  . History of migraine    none recent as of 01-27-2020  . History of ulcerative colitis    non recent flares  . Hyperlipidemia   . Hypertension   . Pseudocholinesterase deficiency   . Sleep apnea    last sleep study 10 yrs ago,CPAP-  no current cpap use    Discharge Diagnoses:   Active Problems:   S/P revision of total knee, left  Estimated body mass index is 31.33 kg/m as calculated from the following:   Height as of this encounter: 6' (1.829 m).   Weight as of this encounter: 104.8 kg.  Procedure:  Procedure(s) (LRB): Left knee excision saphenous nerve branch, neurectomy, left open scar excision with polyethlene revision (Left)   Consults: None  HPI:   The patient is a pleasant 70 year old male who has been seen and evaluated in the office following a total knee replacement performed by one of my partners.  We have been working through the challenges of postoperative pain with his left  knee.  Clinical exam had revealed concerns regarding sensitivity over the medial side of the knee as well  as mechanical based symptoms.  Workup for infection or loosening was negative with bone scan and serum C-reactive protein and sedimentation rates.   After a lengthy discussion regarding the persistence of his pain, we had discussed options including those outlined above.  Given the fact that I told him that risks included the persistence of pain despite our attempts to try and improve his condition  as well as standard risk for infection, DVT, component failure and need for future surgeries.  He wished and at this point has elected to proceed with revision surgery as outlined.  Consent was obtained for the benefit of attempted pain relief.  Laboratory Data: Admission on 05/18/2020, Discharged on 05/19/2020  Component Date Value Ref Range Status  . WBC 05/19/2020 14.5* 4.0 - 10.5 K/uL Final  . RBC 05/19/2020 3.61* 4.22 - 5.81 MIL/uL Final  . Hemoglobin 05/19/2020 10.8* 13.0 - 17.0 g/dL Final  . HCT 05/19/2020 32.2* 39.0 - 52.0 % Final  . MCV 05/19/2020 89.2  80.0 - 100.0 fL Final  . MCH 05/19/2020 29.9  26.0 - 34.0 pg Final  . MCHC 05/19/2020 33.5  30.0 - 36.0 g/dL Final  . RDW 05/19/2020 13.3  11.5 - 15.5 % Final  . Platelets 05/19/2020 276  150 - 400 K/uL Final  . nRBC 05/19/2020 0.0  0.0 - 0.2 % Final   Performed at [facility], [place] [address] [place], [place]  [address]  . Sodium 05/19/2020 137  135 - 145 mmol/L Final  . Potassium 05/19/2020 3.9  3.5 - 5.1 mmol/L Final  . Chloride 05/19/2020 106  98 - 111 mmol/L Final  . CO2 05/19/2020 24  22 - 32 mmol/L Final  . Glucose, Bld 05/19/2020 139* 70 - 99 mg/dL Final   Glucose reference range applies only to samples taken after fasting for at least 8 hours.  . BUN 05/19/2020 19  8 - 23 mg/dL Final  . Creatinine, Ser 05/19/2020 0.76  0.61 - 1.24 mg/dL Final  . Calcium 05/19/2020 8.7* 8.9 - 10.3 mg/dL Final  . GFR, Estimated 05/19/2020 >60  >60 mL/min Final   Comment: (NOTE) Calculated using the CKD-EPI Creatinine Equation  (2021)   . Anion gap 05/19/2020 7  5 - 15 Final   Performed at [facility], [place] [address], [place], [place] [address]  Hospital Outpatient Visit on 05/15/2020  Component Date Value Ref Range Status  . SARS Coronavirus 2 05/15/2020 NEGATIVE  NEGATIVE Final   Comment: (NOTE) SARS-CoV-2 target nucleic acids are NOT DETECTED.  The SARS-CoV-2 RNA is generally detectable in upper and lower respiratory specimens during the acute phase of infection. Negative results do not preclude SARS-CoV-2 infection, do not rule out co-infections with other pathogens, and should not be used as the sole basis for treatment or other patient management decisions. Negative results must be combined with clinical observations, patient history, and epidemiological information. The expected result is Negative.  Fact Sheet for Patients: SugarRoll.be  Fact Sheet for Healthcare Providers: https://www.woods-mathews.com/  This test is not yet approved or cleared by the [place] FDA and  has been authorized for detection and/or diagnosis of SARS-CoV-2 by FDA under an Emergency Use Authorization (EUA). This EUA will remain  in effect (meaning this test can be used) for the duration of the COVID-19 declaration under Se                          ction 564(b)(1) of the Act, 21 U.S.C. section 360bbb-3(b)(1), unless the authorization is terminated or revoked sooner.  Performed at [facility], [place] [address], [place], [place] [address]   Hospital Outpatient Visit on 05/12/2020  Component Date Value Ref Range Status  . WBC 05/12/2020 7.0  4.0 - 10.5 K/uL Final  . RBC 05/12/2020 4.19* 4.22 - 5.81 MIL/uL Final  . Hemoglobin 05/12/2020 12.5* 13.0 - 17.0 g/dL Final  . HCT 05/12/2020 36.9* 39.0 - 52.0 % Final  . MCV 05/12/2020 88.1  80.0 - 100.0 fL Final  . MCH 05/12/2020 29.8  26.0 - 34.0 pg Final  . MCHC 05/12/2020 33.9  30.0 - 36.0 g/dL Final  .  RDW 05/12/2020 12.8  11.5 - 15.5 % Final  . Platelets 05/12/2020 290  150 - 400 K/uL Final  . nRBC 05/12/2020 0.0  0.0 - 0.2 % Final   Performed at [facility], [place] [address] [place], [place] [address]  . MRSA, PCR 05/12/2020 POSITIVE* NEGATIVE Final   Comment: RESULT CALLED TO, READ BACK BY AND VERIFIED WITH: HESTER,T. RN @1318  ON 03.18.2022 BY COHEN,K   . Staphylococcus aureus 05/12/2020 POSITIVE* NEGATIVE Final   Comment: (NOTE) The Xpert SA Assay (FDA approved for NASAL specimens in patients 21 years of age and older), is one component of a comprehensive surveillance program. It is not intended to diagnose infection nor to guide or monitor treatment. Performed at [facility], [place] [address], [place], [place] [address]   . Sodium 05/12/2020 133* 135 - 145 mmol/L Final  . Potassium 05/12/2020 4.3  3.5 - 5.1 mmol/L Final  . Chloride 05/12/2020 98  98 - 111 mmol/L Final  . CO2 05/12/2020 25  22 - 32 mmol/L Final  . Glucose, Bld 05/12/2020 122* 70 - 99 mg/dL Final   Glucose reference range applies only to samples taken after fasting for at least 8 hours.  . BUN 05/12/2020 14  8 - 23 mg/dL Final  . Creatinine, Ser 05/12/2020 0.82  0.61 - 1.24 mg/dL Final  . Calcium 05/12/2020 9.3  8.9 - 10.3 mg/dL Final  . GFR, Estimated 05/12/2020 >60  >60 mL/min Final   Comment: (NOTE) Calculated using the CKD-EPI Creatinine Equation (2021)   . Anion gap 05/12/2020 10  5 - 15 Final   Performed at [facility], [place] [address] [place], [place] [address]     X-Rays:No results found.  EKG: Orders placed or performed during the hospital encounter of 05/12/20  . EKG 12 lead per protocol  . EKG 12 lead per protocol     Hospital Course: GRAYDEN BURLEY is a 70 y.o. who was admitted to [facility]. They were brought to the operating room on 05/18/2020 and underwent Procedure(s): Left knee excision saphenous nerve branch,  neurectomy, left open scar excision with polyethlene revision.  Patient tolerated the procedure well and was later transferred to the recovery room and then to the orthopaedic floor for postoperative care. They were given PO and IV analgesics for pain control following their surgery. They were given 24 hours of postoperative antibiotics of  Anti-infectives (From admission, onward)   Start     Dose/Rate Route Frequency Ordered Stop   05/18/20 1400  ceFAZolin (ANCEF) IVPB 2g/100 mL premix        2 g 200 mL/hr over 30 Minutes Intravenous Every 6 hours 05/18/20 1056 05/18/20 2007   05/18/20 0600  ceFAZolin (ANCEF) IVPB 2g/100 mL premix        2 g 200 mL/hr over 30 Minutes Intravenous On call to O.R. 05/18/20 0527 05/18/20 0812   05/18/20 0530  vancomycin (VANCOCIN) IVPB 1000 mg/200 mL premix        1,000 mg 200 mL/hr over 60 Minutes Intravenous  Once 05/18/20 0527 05/18/20 0745     and started on DVT prophylaxis in the form of Aspirin.   PT and OT were ordered for total joint protocol. Discharge planning consulted to help with postop disposition and equipment needs.  Patient had a good night on the evening of surgery. They started to get up OOB with therapy on POD #0. Pt was seen during rounds and was ready to go home pending progress with therapy.He worked with therapy on POD #1 and was meeting his goals. Pt was discharged to home later that day in stable condition.  Diet: Regular diet Activity: WBAT Follow-up: in 2 weeks Disposition: Home Discharged Condition: good   Discharge Instructions    Call MD / Call 911   Complete by: As directed    If you experience chest pain or shortness of breath, CALL 911 and be transported to the hospital emergency room.  If you develope a fever above 101 F, pus (white drainage) or increased drainage or redness at the wound, or calf pain, call your surgeon's office.   Change dressing   Complete by: As directed    Maintain surgical dressing until follow  up in  the clinic. If the edges start to pull up, may reinforce with tape. If the dressing is no longer working, may remove and cover with gauze and tape, but must keep the area dry and clean.  Call with any questions or concerns.   Constipation Prevention   Complete by: As directed    Drink plenty of fluids.  Prune juice may be helpful.  You may use a stool softener, such as Colace (over the counter) 100 mg twice a day.  Use MiraLax (over the counter) for constipation as needed.   Diet - low sodium heart healthy   Complete by: As directed    Discharge instructions   Complete by: As directed    Maintain surgical dressing until follow up in the clinic. If the edges start to pull up, may reinforce with tape. If the dressing is no longer working, may remove and cover with gauze and tape, but must keep the area dry and clean.  Follow up in 2 weeks at [facility]. Call with any questions or concerns.   Increase activity slowly as tolerated   Complete by: As directed    Weight bearing as tolerated with assist device (walker, cane, etc) as directed, use it as long as suggested by your surgeon or therapist, typically at least 4-6 weeks.   TED hose   Complete by: As directed    Use stockings (TED hose) for 2 weeks on both leg(s).  You may remove them at night for sleeping.     Allergies as of 05/19/2020      Reactions   Heparin Anaphylaxis   breathing problems   Succinylcholine Chloride Other (See Comments)   does not wake up from anesthetic, pt. Reports that through testing that he was found  not to have the enzyme to breakdown the Succ.   Vancomycin Hives, Other (See Comments)   Ran off a pump. So developed redman's . Running too fast   Penicillins Rash   Did it involve swelling of the face/tongue/throat, SOB, or low BP? No Did it involve sudden or severe rash/hives, skin peeling, or any reaction on the inside of your mouth or nose? No Did you need to seek medical attention at a hospital or doctor's  office? No When did it last happen?childhood If all above answers are "NO", may proceed with cephalosporin use. Tolerated Cephalosporin Date: 05/18/20.      Medication List    STOP taking these medications   aspirin EC 81 MG tablet Replaced by: aspirin 81 MG chewable tablet     TAKE these medications   acetaminophen 650 MG CR tablet Commonly known as: TYLENOL Take 1,300 mg by mouth every 8 (eight) hours as needed for pain.   amLODipine 10 MG tablet Commonly known as: NORVASC TAKE 1 TABLET BY MOUTH EVERY DAY   aspirin 81 MG chewable tablet Chew 1 tablet (81 mg total) by mouth 2 (two) times daily for 28 days. Replaces: aspirin EC 81 MG tablet   celecoxib 200 MG capsule Commonly known as: CELEBREX Take 1 capsule (200 mg total) by mouth 2 (two) times daily.   cetirizine 10 MG tablet Commonly known as: ZYRTEC Take 10 mg by mouth daily as needed for allergies.   CoQ10 200 MG Caps Take 200 mg by mouth daily.   docusate sodium 100 MG capsule Commonly known as: COLACE Take 1 capsule (100 mg total) by mouth 2 (two) times daily.   dutasteride 0.5 MG capsule Commonly known as: AVODART TAKE  1 CAPSULE BY MOUTH EVERY DAY   fluticasone 50 MCG/ACT nasal spray Commonly known as: FLONASE Place 2 sprays into both nostrils daily.   HAIR SKIN & NAILS GUMMIES PO Take 2 tablets by mouth daily.   hydrochlorothiazide 25 MG tablet Commonly known as: HYDRODIURIL Take 1 tablet (25 mg total) by mouth daily.   irbesartan 150 MG tablet Commonly known as: AVAPRO Take 225 mg by mouth daily.   melatonin 5 MG Tabs Take 10 mg by mouth at bedtime as needed (sleep).   methocarbamol 500 MG tablet Commonly known as: ROBAXIN Take 1 tablet (500 mg total) by mouth every 6 (six) hours as needed for muscle spasms.   mupirocin ointment 2 % Commonly known as: BACTROBAN Apply 1 application topically daily as needed (skin bumps).   oxyCODONE 5 MG immediate release tablet Commonly known as:  Oxy IR/ROXICODONE Take 1-3 tablets (5-15 mg total) by mouth every 6 (six) hours as needed (Post-operative pain).   oxyCODONE-acetaminophen 10-325 MG tablet Commonly known as: PERCOCET Take 1 tablet by mouth 4 (four) times daily.   polyethylene glycol 17 g packet Commonly known as: MIRALAX / GLYCOLAX Take 17 g by mouth daily as needed for mild constipation.   rosuvastatin 10 MG tablet Commonly known as: CRESTOR TAKE 1 TABLET BY MOUTH EVERY DAY   vitamin B-12 500 MCG tablet Commonly known as: CYANOCOBALAMIN Take 1,000 mcg by mouth daily.   vitamin C 500 MG tablet Commonly known as: ASCORBIC ACID Take 1,000 mg by mouth daily.   zolpidem 10 MG tablet Commonly known as: AMBIEN TAKE 1 TABLET BY MOUTH EVERYDAY AT BEDTIME What changed: See the new instructions.            Discharge Care Instructions  (From admission, onward)         Start     Ordered   05/19/20 0000  Change dressing       Comments: Maintain surgical dressing until follow up in the clinic. If the edges start to pull up, may reinforce with tape. If the dressing is no longer working, may remove and cover with gauze and tape, but must keep the area dry and clean.  Call with any questions or concerns.   05/19/20 2248          Follow-up Information    [name], MD. Schedule an appointment as soon as possible for a visit in 2 weeks.   Specialty: Orthopedic Surgery Contact information: [address] [place] [address] [phone]               Signed: Griffith Citron, [name] Orthopedic Surgery 05/28/2020, 10:25 AM

## 2020-05-30 DIAGNOSIS — M25662 Stiffness of left knee, not elsewhere classified: Secondary | ICD-10-CM | POA: Diagnosis not present

## 2020-05-31 ENCOUNTER — Ambulatory Visit: Payer: Medicare HMO

## 2020-05-31 NOTE — Progress Notes (Deleted)
Patient ID: Dylan [name]                 DOB: 01/28/1951                      MRN: [id number]     HPI: Dylan [name] is a 70 y.o. male referred by Dr. Harrington Challenger to HTN clinic. PMH is significant for  significant for HTN,DM,OSA, BPH,chronic pain,and HLD.  BP has been elevated in past year since wife passed away in 2022-07-14. PCP increased irbesartan to 150mg  daily however hypertension is complicated by chronic pain. Is scheduled for knee surgery in March 2022 At last visit with Dr Harrington Challenger, HCTZ was intiated at 12.5mg  once daily and at previous visit with PharmD, irbesartan was increased to 225mg  once daily.  At last visit patient reported cutting back significantly on his salt and caffene intake. BP in clinic was at goal. Home BP cuff has not been calibrated and unsure if accurate. He has knee surgery 05/18/20.  Dizziness, lightheadedness, headache, blurred vision, SOB, swelling New Bp cuff? Pain? Salt? Coffee?  Current HTN meds: amlodipine 10mg  daily, irbesartan 225mg  daily, HCTZ 25mg  daily,  Previously tried: valsartan 160, losartan 50,  BP goal: <130/80  Home BP readings:   3/2: 137/84  98 3/1: 142/92  97 2/28: 141/87  96 2/27: 148/96  98 2/26:  138/83  90 2/25:  136/72  94  Wt Readings from Last 3 Encounters:  05/18/20 231 lb (104.8 kg)  01/31/20 228 lb 6.4 oz (103.6 kg)  01/14/20 233 lb 12.8 oz (106.1 kg)   BP Readings from Last 3 Encounters:  05/19/20 (!) 160/89  05/12/20 (!) 152/78  04/26/20 (!) 122/58   Pulse Readings from Last 3 Encounters:  05/19/20 89  05/12/20 84  04/26/20 87    Renal function: Estimated Creatinine Clearance: 110.6 mL/min (by C-G formula based on SCr of 0.76 mg/dL).  Past Medical History:  Diagnosis Date  . Allergy    allergic rhinitis  . Anxiety   . Arthritis    R knee, hands , back oa  . BPH (benign prostatic hyperplasia)   . Cancer ([place])    melonoma- on face, treated with excision   . Complication of anesthesia    trouble waking  up with succycholine, does not have enzyme to break down succycholine  . Coronary artery disease    nonobstructive  . Dizziness    MRI brain unremarkable 02/05/2015. resolved as of 01-27-2020  . History of melanoma 15 yrs ago   face and forehead-- previously followed by Derm in [place]  . History of migraine    none recent as of 01-27-2020  . History of ulcerative colitis    non recent flares  . Hyperlipidemia   . Hypertension   . Pseudocholinesterase deficiency   . Sleep apnea    last sleep study 10 yrs ago,CPAP-  no current cpap use     Current Outpatient Medications on File Prior to Visit  Medication Sig Dispense Refill  . acetaminophen (TYLENOL) 650 MG CR tablet Take 1,300 mg by mouth every 8 (eight) hours as needed for pain.    Marland Kitchen amLODipine (NORVASC) 10 MG tablet TAKE 1 TABLET BY MOUTH EVERY DAY (Patient taking differently: Take 10 mg by mouth daily.) 90 tablet 3  . aspirin 81 MG chewable tablet Chew 1 tablet (81 mg total) by mouth 2 (two) times daily for 28 days. 56 tablet 0  . Biotin w/ Vitamins C &  E (HAIR SKIN & NAILS GUMMIES PO) Take 2 tablets by mouth daily.    . celecoxib (CELEBREX) 200 MG capsule Take 1 capsule (200 mg total) by mouth 2 (two) times daily. 60 capsule 0  . cetirizine (ZYRTEC) 10 MG tablet Take 10 mg by mouth daily as needed for allergies.    . Coenzyme Q10 (COQ10) 200 MG CAPS Take 200 mg by mouth daily.    Marland Kitchen docusate sodium (COLACE) 100 MG capsule Take 1 capsule (100 mg total) by mouth 2 (two) times daily. 10 capsule 0  . dutasteride (AVODART) 0.5 MG capsule TAKE 1 CAPSULE BY MOUTH EVERY DAY (Patient not taking: Reported on 05/17/2020) 90 capsule 1  . esomeprazole (NEXIUM) 40 MG capsule TAKE 1 CAPSULE BY MOUTH EVERY DAY BEFORE BREAKFAST 90 capsule 1  . fluticasone (FLONASE) 50 MCG/ACT nasal spray Place 2 sprays into both nostrils daily. 16 g 6  . hydrochlorothiazide (HYDRODIURIL) 25 MG tablet Take 1 tablet (25 mg total) by mouth daily. 90 tablet 3  .  irbesartan (AVAPRO) 150 MG tablet Take 225 mg by mouth daily.    . Melatonin 5 MG TABS Take 10 mg by mouth at bedtime as needed (sleep).    . methocarbamol (ROBAXIN) 500 MG tablet Take 1 tablet (500 mg total) by mouth every 6 (six) hours as needed for muscle spasms. 40 tablet 0  . mupirocin ointment (BACTROBAN) 2 % Apply 1 application topically daily as needed (skin bumps).    Marland Kitchen oxyCODONE (OXY IR/ROXICODONE) 5 MG immediate release tablet Take 1-3 tablets (5-15 mg total) by mouth every 6 (six) hours as needed (Post-operative pain). 60 tablet 0  . oxyCODONE-acetaminophen (PERCOCET) 10-325 MG tablet Take 1 tablet by mouth 4 (four) times daily.    . polyethylene glycol (MIRALAX / GLYCOLAX) 17 g packet Take 17 g by mouth daily as needed for mild constipation. 14 each 0  . rosuvastatin (CRESTOR) 10 MG tablet TAKE 1 TABLET BY MOUTH EVERY DAY (Patient taking differently: Take 10 mg by mouth daily.) 90 tablet 1  . vitamin B-12 (CYANOCOBALAMIN) 500 MCG tablet Take 1,000 mcg by mouth daily.    . vitamin C (ASCORBIC ACID) 500 MG tablet Take 1,000 mg by mouth daily.    Marland Kitchen zolpidem (AMBIEN) 10 MG tablet TAKE 1 TABLET BY MOUTH EVERYDAY AT BEDTIME (Patient taking differently: Take 10 mg by mouth at bedtime.) 30 tablet 2   No current facility-administered medications on file prior to visit.    Allergies  Allergen Reactions  . Heparin Anaphylaxis    breathing problems  . Succinylcholine Chloride Other (See Comments)    does not wake up from anesthetic, pt. Reports that through testing that he was found  not to have the enzyme to breakdown the Succ.  . Vancomycin Hives and Other (See Comments)    Ran off a pump. So developed redman's . Running too fast  . Penicillins Rash    Did it involve swelling of the face/tongue/throat, SOB, or low BP? No Did it involve sudden or severe rash/hives, skin peeling, or any reaction on the inside of your mouth or nose? No Did you need to seek medical attention at a hospital or  doctor's office? No When did it last happen?childhood If all above answers are "NO", may proceed with cephalosporin use.  Tolerated Cephalosporin Date: 05/18/20.       Assessment/Plan:  1. Hypertension - BP in room today 122/58 which is at goal of <130/80.  Recommend patient continue current medication therapy  and diet changes.  Recommend patient purchase an Omron BP cuff if he can not bring in his [facility] cuff for validation.  Placed order for BMP to be performed at time of pre op lab work and printed lab requisition form.  Patient voiced understanding.  Recheck in 4 weeks.  Continue amlodipine 10mg  daily Continue irbesartan 225mg  daily Continue HCTZ 25mg  daily Check BMP on 05/12/20  Dylan [name], PharmD, BCACP, [place], [place] [address] [place], [place] [address] Phone: [phone]; Fax: [phone] 05/31/2020 8:05 AM Recheck in 4 weeks

## 2020-06-01 DIAGNOSIS — M25662 Stiffness of left knee, not elsewhere classified: Secondary | ICD-10-CM | POA: Diagnosis not present

## 2020-06-03 DIAGNOSIS — M5416 Radiculopathy, lumbar region: Secondary | ICD-10-CM | POA: Diagnosis not present

## 2020-06-08 DIAGNOSIS — M25662 Stiffness of left knee, not elsewhere classified: Secondary | ICD-10-CM | POA: Diagnosis not present

## 2020-06-13 DIAGNOSIS — M25662 Stiffness of left knee, not elsewhere classified: Secondary | ICD-10-CM | POA: Diagnosis not present

## 2020-06-18 ENCOUNTER — Other Ambulatory Visit: Payer: Self-pay | Admitting: Family Medicine

## 2020-06-18 DIAGNOSIS — E785 Hyperlipidemia, unspecified: Secondary | ICD-10-CM

## 2020-06-20 ENCOUNTER — Ambulatory Visit: Payer: Medicare HMO | Admitting: Family Medicine

## 2020-06-20 DIAGNOSIS — M25662 Stiffness of left knee, not elsewhere classified: Secondary | ICD-10-CM | POA: Diagnosis not present

## 2020-06-22 ENCOUNTER — Other Ambulatory Visit: Payer: Self-pay | Admitting: Family Medicine

## 2020-06-22 DIAGNOSIS — E785 Hyperlipidemia, unspecified: Secondary | ICD-10-CM

## 2020-06-26 ENCOUNTER — Ambulatory Visit: Payer: Medicare HMO

## 2020-06-28 DIAGNOSIS — Z96652 Presence of left artificial knee joint: Secondary | ICD-10-CM | POA: Diagnosis not present

## 2020-06-28 DIAGNOSIS — Z471 Aftercare following joint replacement surgery: Secondary | ICD-10-CM | POA: Diagnosis not present

## 2020-07-01 NOTE — Telephone Encounter (Signed)
It has been a bit since wrote in for recommendation WOuld recomm Dylan [name]   She is in office on [facility]

## 2020-07-07 DIAGNOSIS — G894 Chronic pain syndrome: Secondary | ICD-10-CM | POA: Diagnosis not present

## 2020-07-08 ENCOUNTER — Other Ambulatory Visit: Payer: Self-pay | Admitting: Family Medicine

## 2020-07-08 DIAGNOSIS — E785 Hyperlipidemia, unspecified: Secondary | ICD-10-CM

## 2020-07-12 ENCOUNTER — Ambulatory Visit: Payer: Medicare HMO | Admitting: Family Medicine

## 2020-07-13 ENCOUNTER — Other Ambulatory Visit: Payer: Self-pay | Admitting: Family Medicine

## 2020-07-13 DIAGNOSIS — E785 Hyperlipidemia, unspecified: Secondary | ICD-10-CM

## 2020-07-21 ENCOUNTER — Other Ambulatory Visit: Payer: Self-pay | Admitting: Family Medicine

## 2020-07-21 DIAGNOSIS — E785 Hyperlipidemia, unspecified: Secondary | ICD-10-CM

## 2020-08-07 ENCOUNTER — Telehealth: Payer: Medicare HMO | Admitting: Physician Assistant

## 2020-08-07 DIAGNOSIS — J019 Acute sinusitis, unspecified: Secondary | ICD-10-CM

## 2020-08-07 DIAGNOSIS — B9689 Other specified bacterial agents as the cause of diseases classified elsewhere: Secondary | ICD-10-CM

## 2020-08-07 MED ORDER — DOXYCYCLINE HYCLATE 100 MG PO TABS: 100.0000 mg | ORAL_TABLET | Freq: Two times a day (BID) | ORAL | 0 refills | Status: DC

## 2020-08-07 NOTE — Progress Notes (Signed)
We are sorry that you are not feeling well.  Here is how we plan to help!  Based on what you have shared with me it looks like you have sinusitis.  Sinusitis is inflammation and infection in the sinus cavities of the head.  Based on your presentation I believe you most likely have Acute Bacterial Sinusitis.  This is an infection caused by bacteria and is treated with antibiotics. I have prescribed Doxycycline 100mg  by mouth twice a day for 10 days. You may use an oral decongestant such as Mucinex D or if you have glaucoma or high blood pressure use plain Mucinex. Saline nasal spray help and can safely be used as often as needed for congestion.  If you develop worsening sinus pain, fever or notice severe headache and vision changes, or if symptoms are not better after completion of antibiotic, please schedule an appointment with a health care provider.    Sinus infections are not as easily transmitted as other respiratory infection, however we still recommend that you avoid close contact with loved ones, especially the very young and elderly.  Remember to wash your hands thoroughly throughout the day as this is the number one way to prevent the spread of infection!  Home Care: Only take medications as instructed by your medical team. Complete the entire course of an antibiotic. Do not take these medications with alcohol. A steam or ultrasonic humidifier can help congestion.  You can place a towel over your head and breathe in the steam from hot water coming from a faucet. Avoid close contacts especially the very young and the elderly. Cover your mouth when you cough or sneeze. Always remember to wash your hands.  Get Help Right Away If: You develop worsening fever or sinus pain. You develop a severe head ache or visual changes. Your symptoms persist after you have completed your treatment plan.  Make sure you Understand these instructions. Will watch your condition. Will get help right away if  you are not doing well or get worse.  Your e-visit answers were reviewed by a board certified advanced clinical practitioner to complete your personal care plan.  Depending on the condition, your plan could have included both over the counter or prescription medications.  If there is a problem please reply  once you have received a response from your provider.  Your safety is important to [place].  If you have drug allergies check your prescription carefully.    You can use MyChart to ask questions about today's visit, request a non-urgent call back, or ask for a work or school excuse for 24 hours related to this e-Visit. If it has been greater than 24 hours you will need to follow up with your provider, or enter a new e-Visit to address those concerns.  You will get an e-mail in the next two days asking about your experience.  I hope that your e-visit has been valuable and will speed your recovery. Thank you for using e-visits.  I provided 5 minutes of non face-to-face time during this encounter for chart review and documentation.

## 2020-08-17 DIAGNOSIS — C44329 Squamous cell carcinoma of skin of other parts of face: Secondary | ICD-10-CM | POA: Diagnosis not present

## 2020-08-17 DIAGNOSIS — Z85820 Personal history of malignant melanoma of skin: Secondary | ICD-10-CM | POA: Diagnosis not present

## 2020-08-17 DIAGNOSIS — D485 Neoplasm of uncertain behavior of skin: Secondary | ICD-10-CM | POA: Diagnosis not present

## 2020-08-21 MED ORDER — IRBESARTAN 150 MG PO TABS: 225.0000 mg | ORAL_TABLET | Freq: Every day | ORAL | 3 refills | Status: DC

## 2020-09-26 DIAGNOSIS — M5416 Radiculopathy, lumbar region: Secondary | ICD-10-CM | POA: Diagnosis not present

## 2020-09-26 DIAGNOSIS — Z79891 Long term (current) use of opiate analgesic: Secondary | ICD-10-CM | POA: Diagnosis not present

## 2020-09-26 DIAGNOSIS — Z5181 Encounter for therapeutic drug level monitoring: Secondary | ICD-10-CM | POA: Diagnosis not present

## 2020-09-28 DIAGNOSIS — M5416 Radiculopathy, lumbar region: Secondary | ICD-10-CM | POA: Diagnosis not present

## 2020-10-09 ENCOUNTER — Ambulatory Visit: Payer: Medicare HMO

## 2020-10-11 DIAGNOSIS — N401 Enlarged prostate with lower urinary tract symptoms: Secondary | ICD-10-CM | POA: Diagnosis not present

## 2020-10-11 DIAGNOSIS — Z0000 Encounter for general adult medical examination without abnormal findings: Secondary | ICD-10-CM | POA: Diagnosis not present

## 2020-10-11 DIAGNOSIS — F419 Anxiety disorder, unspecified: Secondary | ICD-10-CM | POA: Diagnosis not present

## 2020-10-11 DIAGNOSIS — Z1159 Encounter for screening for other viral diseases: Secondary | ICD-10-CM | POA: Diagnosis not present

## 2020-10-11 DIAGNOSIS — G894 Chronic pain syndrome: Secondary | ICD-10-CM | POA: Diagnosis not present

## 2020-10-11 DIAGNOSIS — J302 Other seasonal allergic rhinitis: Secondary | ICD-10-CM | POA: Diagnosis not present

## 2020-10-11 DIAGNOSIS — Z125 Encounter for screening for malignant neoplasm of prostate: Secondary | ICD-10-CM | POA: Diagnosis not present

## 2020-10-11 DIAGNOSIS — G4700 Insomnia, unspecified: Secondary | ICD-10-CM | POA: Diagnosis not present

## 2020-10-11 DIAGNOSIS — E785 Hyperlipidemia, unspecified: Secondary | ICD-10-CM | POA: Diagnosis not present

## 2020-10-11 DIAGNOSIS — F32A Depression, unspecified: Secondary | ICD-10-CM | POA: Diagnosis not present

## 2020-10-11 DIAGNOSIS — I10 Essential (primary) hypertension: Secondary | ICD-10-CM | POA: Diagnosis not present

## 2020-10-11 DIAGNOSIS — K219 Gastro-esophageal reflux disease without esophagitis: Secondary | ICD-10-CM | POA: Diagnosis not present

## 2020-10-16 DIAGNOSIS — E785 Hyperlipidemia, unspecified: Secondary | ICD-10-CM | POA: Diagnosis not present

## 2020-10-16 DIAGNOSIS — Z125 Encounter for screening for malignant neoplasm of prostate: Secondary | ICD-10-CM | POA: Diagnosis not present

## 2020-10-16 DIAGNOSIS — D649 Anemia, unspecified: Secondary | ICD-10-CM | POA: Diagnosis not present

## 2020-10-16 DIAGNOSIS — Z1159 Encounter for screening for other viral diseases: Secondary | ICD-10-CM | POA: Diagnosis not present

## 2020-10-16 DIAGNOSIS — R739 Hyperglycemia, unspecified: Secondary | ICD-10-CM | POA: Diagnosis not present

## 2020-11-06 DIAGNOSIS — D509 Iron deficiency anemia, unspecified: Secondary | ICD-10-CM | POA: Diagnosis not present

## 2020-11-13 ENCOUNTER — Telehealth: Payer: Self-pay | Admitting: Physician Assistant

## 2020-11-13 NOTE — Telephone Encounter (Signed)
Looked through the schedules of all providers. There are no sooner openings that the one the patient is scheduled for presently. We will put him on a cancellation list for the first opening that comes up.

## 2020-11-14 ENCOUNTER — Other Ambulatory Visit: Payer: Self-pay | Admitting: Family Medicine

## 2020-12-12 ENCOUNTER — Encounter: Payer: Self-pay | Admitting: Physician Assistant

## 2020-12-12 ENCOUNTER — Ambulatory Visit (INDEPENDENT_AMBULATORY_CARE_PROVIDER_SITE_OTHER): Payer: 59 | Admitting: Physician Assistant

## 2020-12-12 VITALS — BP 144/88 | HR 88 | Ht 72.0 in | Wt 225.2 lb

## 2020-12-12 DIAGNOSIS — R634 Abnormal weight loss: Secondary | ICD-10-CM

## 2020-12-12 DIAGNOSIS — K219 Gastro-esophageal reflux disease without esophagitis: Secondary | ICD-10-CM | POA: Diagnosis not present

## 2020-12-12 DIAGNOSIS — D509 Iron deficiency anemia, unspecified: Secondary | ICD-10-CM

## 2020-12-12 DIAGNOSIS — Z860101 Personal history of adenomatous and serrated colon polyps: Secondary | ICD-10-CM

## 2020-12-12 DIAGNOSIS — Z86010 Personal history of colonic polyps: Secondary | ICD-10-CM | POA: Diagnosis not present

## 2020-12-12 DIAGNOSIS — R195 Other fecal abnormalities: Secondary | ICD-10-CM | POA: Diagnosis not present

## 2020-12-12 DIAGNOSIS — R630 Anorexia: Secondary | ICD-10-CM

## 2020-12-12 MED ORDER — IRON 325 (65 FE) MG PO TABS: 1.0000 | ORAL_TABLET | Freq: Two times a day (BID) | ORAL | 4 refills | Status: DC

## 2020-12-12 MED ORDER — NA SULFATE-K SULFATE-MG SULF 17.5-3.13-1.6 GM/177ML PO SOLN: 1.0000 | Freq: Once | ORAL | 0 refills | Status: AC

## 2020-12-12 NOTE — Patient Instructions (Signed)
If you are age 69 or older, your body mass index should be between 23-30. Your Body mass index is 30.55 kg/m. If this is out of the aforementioned range listed, please consider follow up with your Primary Care Provider. __________________________________________________________  The [facility] providers would like to encourage you to use [facility] to communicate with providers for non-urgent requests or questions.  Due to long hold times on the telephone, sending your provider a message by [facility] may be a faster and more efficient way to get a response.  Please allow 48 business hours for a response.  Please remember that this is for non-urgent requests.   You have been scheduled for an endoscopy and colonoscopy. Please follow the written instructions given to you at your visit today. Please pick up your prep supplies at the pharmacy within the next 1-3 days. If you use inhalers (even only as needed), please bring them with you on the day of your procedure.  START Iron 325 mg 1 tablet twice daily (Make ure to hold this starting 5 days before your procedure)  Continue your Nexium 40 mg 1 capsule every morning.  STOP Celebrex.  Follow up pending the results of your Colonoscopy/Endoscopy.  Thank you for entrusting me with your care and choosing [facility].  Amy Esterwood, [name]

## 2020-12-12 NOTE — Progress Notes (Addendum)
Subjective:    Patient ID: Dylan [name], male    DOB: 04-Jun-1950, 70 y.o.   MRN: [id number]  HPI Dylan [name] is a pleasant 70 year old white male, established with Dr. Ardis Hughs, who is referred today by Ermelinda Das DO for evaluation of iron deficiency anemia and Hemoccult positive stool. He was last seen here in 2018 when he underwent EGD and colonoscopy.  EGD revealed a granular appearing mucosa of the esophagus, biopsies were normal, and a small hiatal hernia.  He was noted to have a somewhat twisted appearance of the tissue just above the vocal cords and had been referred to ENT for evaluation. Colonoscopy with 2 small polyps removed 2 to 3 mm and biopsies showed these to be tubular adenomas also noted to have multiple diverticuli. Patient has history of prior melanoma, BPH, osteoarthritis, cervical radiculopathy, GERD, and hypertension. He says he has not noticed any blood in his stool himself nor any melena.  Stools have generally been normal, occasionally has hard stools.  No complaints of abdominal pain, appetite has been decreased and he has had a weight loss of about 25 pounds over the past 3 months.  He says he does get short of full and uncomfortable after eating but really just also does not have an appetite, occasional nausea without vomiting.  He has been on Nexium chronically 40 mg daily for GERD symptoms, had been taking Celebrex once or twice daily but says he has backed off on that recently and is not sure that it makes much difference for his knee pain at any rate. Labs in March 2022 hemoglobin 10.8 hematocrit 32, repeat in August 2022 hemoglobin 12.6 hematocrit 38.8 MCV of 87 Ferritin 22/serum iron 85/iron sat 22/TIBC 389, Hemoccult was done and positive.  Review of Systems. Pertinent positive and negative review of systems were noted in the above HPI section.  All other review of systems was otherwise negative.   Outpatient Encounter Medications as of 12/12/2020  Medication Sig    acetaminophen (TYLENOL) 650 MG CR tablet Take 1,300 mg by mouth every 8 (eight) hours as needed for pain.   amLODipine (NORVASC) 10 MG tablet TAKE 1 TABLET BY MOUTH EVERY DAY (Patient taking differently: Take 10 mg by mouth daily.)   Biotin w/ Vitamins C & E (HAIR SKIN & NAILS GUMMIES PO) Take 2 tablets by mouth daily.   celecoxib (CELEBREX) 200 MG capsule Take 1 capsule (200 mg total) by mouth 2 (two) times daily.   cetirizine (ZYRTEC) 10 MG tablet Take 10 mg by mouth daily as needed for allergies.   Coenzyme Q10 (COQ10) 200 MG CAPS Take 200 mg by mouth daily.   esomeprazole (NEXIUM) 40 MG capsule TAKE 1 CAPSULE BY MOUTH EVERY DAY BEFORE BREAKFAST   Ferrous Sulfate (IRON) 325 (65 Fe) MG TABS Take 1 tablet (325 mg total) by mouth in the morning and at bedtime.   fluticasone (FLONASE) 50 MCG/ACT nasal spray Place 2 sprays into both nostrils daily.   irbesartan (AVAPRO) 150 MG tablet Take 1.5 tablets (225 mg total) by mouth daily.   Melatonin 5 MG TABS Take 10 mg by mouth at bedtime as needed (sleep).   mupirocin ointment (BACTROBAN) 2 % Apply 1 application topically daily as needed (skin bumps).   Na Sulfate-K Sulfate-Mg Sulf 17.5-3.13-1.6 GM/177ML SOLN Take 1 kit by mouth once for 1 dose.   oxyCODONE-acetaminophen (PERCOCET) 10-325 MG tablet Take 1 tablet by mouth 4 (four) times daily.   rosuvastatin (CRESTOR) 10 MG tablet TAKE 1  TABLET BY MOUTH EVERY DAY   vitamin B-12 (CYANOCOBALAMIN) 500 MCG tablet Take 1,000 mcg by mouth daily.   vitamin C (ASCORBIC ACID) 500 MG tablet Take 1,000 mg by mouth daily.   zolpidem (AMBIEN) 10 MG tablet TAKE 1 TABLET BY MOUTH EVERYDAY AT BEDTIME (Patient taking differently: Take 10 mg by mouth at bedtime.)   hydrochlorothiazide (HYDRODIURIL) 25 MG tablet Take 1 tablet (25 mg total) by mouth daily.   [DISCONTINUED] docusate sodium (COLACE) 100 MG capsule Take 1 capsule (100 mg total) by mouth 2 (two) times daily.   [DISCONTINUED] doxycycline (VIBRA-TABS) 100 MG  tablet Take 1 tablet (100 mg total) by mouth 2 (two) times daily.   [DISCONTINUED] methocarbamol (ROBAXIN) 500 MG tablet Take 1 tablet (500 mg total) by mouth every 6 (six) hours as needed for muscle spasms.   [DISCONTINUED] oxyCODONE (OXY IR/ROXICODONE) 5 MG immediate release tablet Take 1-3 tablets (5-15 mg total) by mouth every 6 (six) hours as needed (Post-operative pain).   [DISCONTINUED] polyethylene glycol (MIRALAX / GLYCOLAX) 17 g packet Take 17 g by mouth daily as needed for mild constipation.   No facility-administered encounter medications on file as of 12/12/2020.   Allergies  Allergen Reactions   Heparin Anaphylaxis    breathing problems   Succinylcholine Chloride Other (See Comments)    does not wake up from anesthetic, pt. Reports that through testing that he was found  not to have the enzyme to breakdown the Succ.   Vancomycin Hives and Other (See Comments)    Ran off a pump. So developed redman's . Running too fast   Penicillins Rash    Did it involve swelling of the face/tongue/throat, SOB, or low BP? No Did it involve sudden or severe rash/hives, skin peeling, or any reaction on the inside of your mouth or nose? No Did you need to seek medical attention at a hospital or doctor's office? No When did it last happen?     childhood  If all above answers are "NO", may proceed with cephalosporin use.  Tolerated Cephalosporin Date: 05/18/20.     Patient Active Problem List   Diagnosis Date Noted   Hx of adenomatous colonic polyps 12/12/2020   S/P revision of total knee, left 05/18/2020   Enlarged prostate with urinary obstruction 01/31/2020   Lumbago with sciatica, right side 01/26/2020   Other chronic pain 01/26/2020   Chronic midline low back pain 11/24/2019   Coronary artery disease involving native heart without angina pectoris 11/24/2019   Dyslipidemia 11/24/2019   Elevated blood sugar 11/24/2019   Gastroesophageal reflux disease without esophagitis 11/24/2019    Seasonal allergies 11/24/2019   Overweight (BMI 25.0-29.9) 10/19/2018   Abnormal stress test    Cervical radiculopathy 08/14/2017   Chronic use of opiate for therapeutic purpose 07/22/2017   Ulnar neuropathy of both upper extremities 06/19/2017   Numbness and tingling in both hands 06/16/2017   Encounter for long-term opiate analgesic use 06/16/2017   Spondylitis with radiculitis, cervical ([place]) 03/12/2017   Routine general medical examination at a health care facility 11/11/2016   Oropharyngeal dysphagia 03/05/2016   Lumbar stenosis with neurogenic claudication 09/12/2015   Hypertension 05/10/2011   BPH (benign prostatic hyperplasia) 08/18/2010   OA (osteoarthritis) 11/15/2009   CARPAL TUNNEL SYNDROME 08/07/2009   OSA on CPAP 03/22/2009   Insomnia w/ sleep apnea 03/22/2009   Hx of malignant melanoma 11/17/2008   Hyperlipidemia LDL goal <100 11/17/2008   Allergic rhinitis 11/17/2008   Social History   Socioeconomic History  Marital status: Widowed    Spouse name: Not on file   Number of children: 3   Years of education: Not on file   Highest education level: Not on file  Occupational History   Occupation: retired  Tobacco Use   Smoking status: Never   Smokeless tobacco: Former    Types: Chew    Quit date: 02/26/2003  Vaping Use   Vaping Use: Never used  Substance and Sexual Activity   Alcohol use: No   Drug use: No   Sexual activity: Not on file  Other Topics Concern   Not on file  Social History Narrative   Wife works at cath lab at [employer] advanced breast cancer   Lives with wife in a 2 story home.  Retired.  Education: college.    Social Determinants of Health   Financial Resource Strain: Not on file  Food Insecurity: Not on file  Transportation Needs: Not on file  Physical Activity: Not on file  Stress: Not on file  Social Connections: Not on file  Intimate Partner Violence: Not on file    Dylan [name] family history includes Cancer in his brother;  Diabetes in his father; Ovarian cancer in his mother.      Objective:    Vitals:   12/12/20 1106  BP: (!) 144/88  Pulse: 88    Physical Exam Well-developed well-nourished older white male no acute distress.  Height, Weight, 225 BMI 30.5  HEENT; nontraumatic normocephalic, EOMI, PE R LA, sclera anicteric. Oropharynx; not examined today Neck; supple, no JVD Cardiovascular; regular rate and rhythm with S1-S2, no murmur rub or gallop Pulmonary; Clear bilaterally Abdomen; soft, nontender, nondistended, no palpable mass or hepatosplenomegaly, bowel sounds are active, appendectomy scar Rectal; not done today Skin; benign exam, no jaundice rash or appreciable lesions Extremities; no clubbing cyanosis or edema skin warm and dry Neuro/Psych; alert and oriented x4, grossly nonfocal mood and affect appropriate        Assessment & Plan:   #27 70 year old white male with new mild iron deficiency anemia, Hemoccult positive stool with complaints of some postprandial abdominal discomfort and fullness and decrease in appetite over the past couple of months with subsequent 25 pound weight loss.  Patient had been taking Celebrex fairly regularly, on Nexium chronically for GERD.  Etiology of symptoms is not entirely clear, Rule out NSAID induced peptic ulcer disease, occult neoplasm, chronic gastropathy.  #2 history of adenomatous colon polyps-last colonoscopy 2018 3.  Diverticulosis 4.  History of chronic GERD maintained on Nexium 40 mg daily 5.  Hypertension 6.  BPH 7.  Prior history of melanoma 8.  Cervical radiculopathy 9.  Osteoarthritis status post knee repair  Plan; stop Celebrex continue Nexium 40 mg p.o. every morning AC breakfast CBC with differential today, c-Met Start ferrous sulfate 325 mg p.o. twice daily with food Patient will be scheduled for colonoscopy and upper endoscopy with Dr. Ardis Hughs.  Both procedures were discussed in detail with the patient including indications risk  and benefits and he is agreeable to proceed. At the time of today's visit patient is appropriate for endoscopic evaluation in the ambulatory care setting. Further recommendation pending results of above.   Linnaea Ahn S Quinzell Malcomb [name] 12/12/2020   Cc: Lazoff, Shawn P, DO

## 2020-12-13 NOTE — Progress Notes (Signed)
I agree with the above note, plan 

## 2020-12-14 DIAGNOSIS — C44329 Squamous cell carcinoma of skin of other parts of face: Secondary | ICD-10-CM | POA: Diagnosis not present

## 2020-12-14 DIAGNOSIS — Z85820 Personal history of malignant melanoma of skin: Secondary | ICD-10-CM | POA: Diagnosis not present

## 2020-12-14 DIAGNOSIS — Z85828 Personal history of other malignant neoplasm of skin: Secondary | ICD-10-CM | POA: Diagnosis not present

## 2020-12-29 ENCOUNTER — Telehealth: Payer: Self-pay | Admitting: Gastroenterology

## 2020-12-29 NOTE — Telephone Encounter (Signed)
Hey Dr. Ardis Hughs,   Patient daughter called in to cancel Egd/Colon for 11/8. States patient is unable to make it and will call back to reschedule.  Thank you

## 2021-01-02 ENCOUNTER — Encounter: Payer: Medicare HMO | Admitting: Gastroenterology

## 2021-01-25 DIAGNOSIS — B079 Viral wart, unspecified: Secondary | ICD-10-CM | POA: Diagnosis not present

## 2021-01-25 DIAGNOSIS — D485 Neoplasm of uncertain behavior of skin: Secondary | ICD-10-CM | POA: Diagnosis not present

## 2021-01-25 DIAGNOSIS — C44311 Basal cell carcinoma of skin of nose: Secondary | ICD-10-CM | POA: Diagnosis not present

## 2021-01-25 DIAGNOSIS — Z85820 Personal history of malignant melanoma of skin: Secondary | ICD-10-CM | POA: Diagnosis not present

## 2021-01-25 DIAGNOSIS — Z85828 Personal history of other malignant neoplasm of skin: Secondary | ICD-10-CM | POA: Diagnosis not present

## 2021-01-25 DIAGNOSIS — L570 Actinic keratosis: Secondary | ICD-10-CM | POA: Diagnosis not present

## 2021-02-11 ENCOUNTER — Other Ambulatory Visit: Payer: Self-pay | Admitting: Internal Medicine

## 2021-02-22 ENCOUNTER — Other Ambulatory Visit: Payer: Self-pay | Admitting: Family Medicine

## 2021-02-22 DIAGNOSIS — E785 Hyperlipidemia, unspecified: Secondary | ICD-10-CM

## 2021-02-25 HISTORY — PX: SKIN CANCER EXCISION: SHX779

## 2021-02-28 ENCOUNTER — Telehealth: Payer: Self-pay | Admitting: *Deleted

## 2021-02-28 NOTE — Telephone Encounter (Signed)
Noted- thanks   [employer]

## 2021-02-28 NOTE — Telephone Encounter (Signed)
John,  Can you review this pt's 08-2015 anesthesia note- states difficult intubation die to reduced neck mobility and anterior larynx - he had subsequent intubation with no issues  Please review and advise  Thanks  Lelan Pons

## 2021-03-05 ENCOUNTER — Other Ambulatory Visit: Payer: Self-pay | Admitting: Internal Medicine

## 2021-03-05 DIAGNOSIS — I10 Essential (primary) hypertension: Secondary | ICD-10-CM

## 2021-03-07 ENCOUNTER — Other Ambulatory Visit: Payer: Self-pay

## 2021-03-07 ENCOUNTER — Ambulatory Visit (AMBULATORY_SURGERY_CENTER): Payer: Medicare HMO | Admitting: *Deleted

## 2021-03-07 VITALS — Ht 72.0 in | Wt 225.0 lb

## 2021-03-07 DIAGNOSIS — Z85828 Personal history of other malignant neoplasm of skin: Secondary | ICD-10-CM | POA: Diagnosis not present

## 2021-03-07 DIAGNOSIS — Z85820 Personal history of malignant melanoma of skin: Secondary | ICD-10-CM | POA: Diagnosis not present

## 2021-03-07 DIAGNOSIS — R195 Other fecal abnormalities: Secondary | ICD-10-CM

## 2021-03-07 DIAGNOSIS — D509 Iron deficiency anemia, unspecified: Secondary | ICD-10-CM

## 2021-03-07 DIAGNOSIS — Z86010 Personal history of colonic polyps: Secondary | ICD-10-CM

## 2021-03-07 DIAGNOSIS — C441191 Basal cell carcinoma of skin of left upper eyelid, including canthus: Secondary | ICD-10-CM | POA: Diagnosis not present

## 2021-03-07 DIAGNOSIS — K219 Gastro-esophageal reflux disease without esophagitis: Secondary | ICD-10-CM

## 2021-03-07 NOTE — Progress Notes (Signed)
Patient's pre-visit was done today over the phone with the patient. Name,DOB and address verified. Patient denies any allergies to Eggs and Soy. Patient denies any problems with anesthesia/sedation. Patient is not taking any diet pills or blood thinners. No home Oxygen. Prep instructions sent to pt's MyChart-pt aware. Patient understands to call us back with any questions or concerns. Patient is aware of our care-partner policy and [id number]-22 safety protocol.   Patient has Suprep at home.  The patient is COVID-19 vaccinated.

## 2021-03-14 ENCOUNTER — Other Ambulatory Visit: Payer: Self-pay

## 2021-03-14 ENCOUNTER — Ambulatory Visit (AMBULATORY_SURGERY_CENTER): Payer: 59 | Admitting: Gastroenterology

## 2021-03-14 ENCOUNTER — Encounter: Payer: Self-pay | Admitting: Gastroenterology

## 2021-03-14 VITALS — BP 111/76 | HR 88 | Temp 98.1°F | Resp 17 | Ht 72.0 in | Wt 225.0 lb

## 2021-03-14 DIAGNOSIS — R195 Other fecal abnormalities: Secondary | ICD-10-CM

## 2021-03-14 DIAGNOSIS — K2970 Gastritis, unspecified, without bleeding: Secondary | ICD-10-CM

## 2021-03-14 DIAGNOSIS — K319 Disease of stomach and duodenum, unspecified: Secondary | ICD-10-CM | POA: Diagnosis not present

## 2021-03-14 DIAGNOSIS — K5730 Diverticulosis of large intestine without perforation or abscess without bleeding: Secondary | ICD-10-CM

## 2021-03-14 DIAGNOSIS — Z86010 Personal history of colonic polyps: Secondary | ICD-10-CM | POA: Diagnosis not present

## 2021-03-14 DIAGNOSIS — R634 Abnormal weight loss: Secondary | ICD-10-CM

## 2021-03-14 DIAGNOSIS — R1013 Epigastric pain: Secondary | ICD-10-CM | POA: Diagnosis not present

## 2021-03-14 DIAGNOSIS — D509 Iron deficiency anemia, unspecified: Secondary | ICD-10-CM | POA: Diagnosis not present

## 2021-03-14 DIAGNOSIS — K2951 Unspecified chronic gastritis with bleeding: Secondary | ICD-10-CM | POA: Diagnosis not present

## 2021-03-14 DIAGNOSIS — Z860101 Personal history of adenomatous and serrated colon polyps: Secondary | ICD-10-CM

## 2021-03-14 DIAGNOSIS — K449 Diaphragmatic hernia without obstruction or gangrene: Secondary | ICD-10-CM

## 2021-03-14 DIAGNOSIS — K219 Gastro-esophageal reflux disease without esophagitis: Secondary | ICD-10-CM

## 2021-03-14 DIAGNOSIS — K259 Gastric ulcer, unspecified as acute or chronic, without hemorrhage or perforation: Secondary | ICD-10-CM

## 2021-03-14 DIAGNOSIS — G4733 Obstructive sleep apnea (adult) (pediatric): Secondary | ICD-10-CM | POA: Diagnosis not present

## 2021-03-14 MED ORDER — SODIUM CHLORIDE 0.9 % IV SOLN
500.0000 mL | Freq: Once | INTRAVENOUS | Status: DC
Start: 2021-03-14 — End: 2021-03-14

## 2021-03-14 NOTE — Op Note (Signed)
[place] Patient Name: Dylan [name] Procedure Date: 03/14/2021 8:58 AM MRN: [id number] Endoscopist: Milus Banister , MD Age: 71 Referring MD:  Date of Birth: 05-17-1950 Gender: Male Account #: [id number] Procedure:                Colonoscopy Indications:              Iron deficiency anemia, mild, weight loss, h/o                            polyps: Colonoscopy 2018 two subCM adenomas Medicines:                Monitored Anesthesia Care Procedure:                Pre-Anesthesia Assessment:                           - Prior to the procedure, a History and Physical                            was performed, and patient medications and                            allergies were reviewed. The patient's tolerance of                            previous anesthesia was also reviewed. The risks                            and benefits of the procedure and the sedation                            options and risks were discussed with the patient.                            All questions were answered, and informed consent                            was obtained. Prior Anticoagulants: The patient has                            taken no previous anticoagulant or antiplatelet                            agents. ASA Grade Assessment: II - A patient with                            mild systemic disease. After reviewing the risks                            and benefits, the patient was deemed in                            satisfactory condition to undergo the procedure.  After obtaining informed consent, the colonoscope                            was passed under direct vision. Throughout the                            procedure, the patient's blood pressure, pulse, and                            oxygen saturations were monitored continuously. The                            Olympus CF-[id number]) Colonoscope was                            introduced through the  anus and advanced to the the                            cecum, identified by appendiceal orifice and                            ileocecal valve. The colonoscopy was performed                            without difficulty. The patient tolerated the                            procedure well. The quality of the bowel                            preparation was good. The ileocecal valve,                            appendiceal orifice, and rectum were photographed. Scope In: 9:02:54 AM Scope Out: 9:15:40 AM Scope Withdrawal Time: 0 hours 9 minutes 37 seconds  Total Procedure Duration: 0 hours 12 minutes 46 seconds  Findings:                 Multiple small-mouthed diverticula were found in                            the left colon.                           The exam was otherwise without abnormality on                            direct and retroflexion views. Complications:            No immediate complications. Estimated blood loss:                            None. Estimated Blood Loss:     Estimated blood loss: none. Impression:               - Diverticulosis in the left colon.                           -  The examination was otherwise normal on direct                            and retroflexion views.                           - No polyps or cancers. Recommendation:           - Repeat colonoscopy in 10 years for screening                            purposes.                           - EGD now. Milus Banister, MD 03/14/2021 9:18:35 AM This report has been signed electronically.

## 2021-03-14 NOTE — Op Note (Addendum)
[facility] Patient Name: Dylan [name] Procedure Date: 03/14/2021 8:57 AM MRN: [id number] Endoscopist: Milus Banister , MD Age: 71 Referring MD:  Date of Birth: [birth year] Gender: Male Account #: [id number] Procedure:                Upper GI endoscopy Indications:              Dysphagia, weight loss, abd pains, mild IDA Medicines:                Monitored Anesthesia Care Procedure:                Pre-Anesthesia Assessment:                           - Prior to the procedure, a History and Physical                            was performed, and patient medications and                            allergies were reviewed. The patient's tolerance of                            previous anesthesia was also reviewed. The risks                            and benefits of the procedure and the sedation                            options and risks were discussed with the patient.                            All questions were answered, and informed consent                            was obtained. Prior Anticoagulants: The patient has                            taken no previous anticoagulant or antiplatelet                            agents. ASA Grade Assessment: II - A patient with                            mild systemic disease. After reviewing the risks                            and benefits, the patient was deemed in                            satisfactory condition to undergo the procedure.                           After obtaining informed consent, the endoscope was  passed under direct vision. Throughout the                            procedure, the patient's blood pressure, pulse, and                            oxygen saturations were monitored continuously. The                            GIF [id number] was introduced through the                            mouth, and advanced to the second part of duodenum.                            The upper GI  endoscopy was accomplished without                            difficulty. The patient tolerated the procedure                            well. Scope In: Scope Out: Findings:                 Normal esophagus, biopsied distally (jar 2) and                            proximally (jar 3).                           One non-bleeding linear gastric ulcer with no                            stigmata of bleeding was found in the prepyloric                            region of the stomach. The lesion was 6 mm in                            largest dimension. Biopsies were taken with a cold                            forceps for histology.                           Small haital hernia.                           The exam was otherwise without abnormality. Complications:            No immediate complications. Estimated blood loss:                            None. Estimated Blood Loss:     Estimated blood loss: none. Impression:               -  Small distal gastric ulcer with no stigmata of                            bleeding. Biopsied to check for H. pylori,                            dysplasia. This may have been from celebrex and/or                            ibuprofen.                           - Small hiatal hernia.                           - Normal esophagus was biopsied to check for H.                            pylori.                           - The examination was otherwise normal. Recommendation:           - Await pathology results. May need further testing                            pending these biopsy results (such as CT scan abd                            pelvis)                           - Patient has a contact number available for                            emergencies. The signs and symptoms of potential                            delayed complications were discussed with the                            patient. Return to normal activities tomorrow.                            Written  discharge instructions were provided to the                            patient. Milus Banister, MD 03/14/2021 9:32:18 AM This report has been signed electronically.

## 2021-03-14 NOTE — Patient Instructions (Addendum)
Thank you for allowing [place] to care for you today! Await final biopsy results, approximately 2 weeks.   Next screening colonoscopy in 10 years. Resume previous diet and medications today, return to normal activities tomorrow, 03/15/21.   YOU HAD AN ENDOSCOPIC PROCEDURE TODAY AT [facility]:   Refer to the procedure report that was given to you for any specific questions about what was found during the examination.  If the procedure report does not answer your questions, please call your gastroenterologist to clarify.  If you requested that your care partner not be given the details of your procedure findings, then the procedure report has been included in a sealed envelope for you to review at your convenience later.  YOU SHOULD EXPECT: Some feelings of bloating in the abdomen. Passage of more gas than usual.  Walking can help get rid of the air that was put into your GI tract during the procedure and reduce the bloating. If you had a lower endoscopy (such as a colonoscopy or flexible sigmoidoscopy) you may notice spotting of blood in your stool or on the toilet paper. If you underwent a bowel prep for your procedure, you may not have a normal bowel movement for a few days.  Please Note:  You might notice some irritation and congestion in your nose or some drainage.  This is from the oxygen used during your procedure.  There is no need for concern and it should clear up in a day or so.  SYMPTOMS TO REPORT IMMEDIATELY:  Following lower endoscopy (colonoscopy or flexible sigmoidoscopy):  Excessive amounts of blood in the stool  Significant tenderness or worsening of abdominal pains  Swelling of the abdomen that is new, acute  Fever of 100F or higher  Following upper endoscopy (EGD)  Vomiting of blood or coffee ground material  New chest pain or pain under the shoulder blades  Painful or persistently difficult swallowing  New shortness of breath  Fever of 100F or higher  Black,  tarry-looking stools  For urgent or emergent issues, a gastroenterologist can be reached at any hour by calling [phone]. Do not use MyChart messaging for urgent concerns.    DIET:  We do recommend a small meal at first, but then you may proceed to your regular diet.  Drink plenty of fluids but you should avoid alcoholic beverages for 24 hours.  ACTIVITY:  You should plan to take it easy for the rest of today and you should NOT DRIVE or use heavy machinery until tomorrow (because of the sedation medicines used during the test).    FOLLOW UP: Our staff will call the number listed on your records 48-72 hours following your procedure to check on you and address any questions or concerns that you may have regarding the information given to you following your procedure. If we do not reach you, we will leave a message.  We will attempt to reach you two times.  During this call, we will ask if you have developed any symptoms of COVID 19. If you develop any symptoms (ie: fever, flu-like symptoms, shortness of breath, cough etc.) before then, please call [phone].  If you test positive for Covid 19 in the 2 weeks post procedure, please call and report this information to [place].    If any biopsies were taken you will be contacted by phone or by letter within the next 1-3 weeks.  Please call us at [phone] if you have not heard about the biopsies  in 3 weeks.    SIGNATURES/CONFIDENTIALITY: You and/or your care partner have signed paperwork which will be entered into your electronic medical record.  These signatures attest to the fact that that the information above on your After Visit Summary has been reviewed and is understood.  Full responsibility of the confidentiality of this discharge information lies with you and/or your care-partner.

## 2021-03-14 NOTE — Progress Notes (Signed)
Report to PACU, RN, vss, BBS= Clear.  

## 2021-03-14 NOTE — Progress Notes (Signed)
HPI: This is a man with MILD IDA, hemocult + stool, chronic GERD, weight loss recently  Colonoscoyp 2018 two subCM adenomas   ROS: complete GI ROS as described in HPI, all other review negative.  Constitutional:  No unintentional weight loss   Past Medical History:  Diagnosis Date   Allergy    allergic rhinitis   Anxiety    Arthritis    R knee, hands , back oa   BPH (benign prostatic hyperplasia)    Cancer ([place])    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 resolved as of 01-27-2020   History of melanoma 15 yrs ago   face and forehead-- previously followed by Derm in [place]   History of migraine    none recent as of 01-27-2020   History of ulcerative colitis    non recent flares   Hyperlipidemia    Hypertension    Pseudocholinesterase deficiency    Skin cancer    Sleep apnea    last sleep study 10 yrs ago,CPAP-  no current cpap use     Past Surgical History:  Procedure Laterality Date   acl and mensicus repair reconstruct Right 1980's   ANTERIOR CERVICAL DECOMP/DISCECTOMY FUSION  10/15/2011   Procedure: ANTERIOR CERVICAL DECOMPRESSION/DISCECTOMY FUSION 1 LEVEL;  Surgeon: [name], MD;  Location: [facility];  Service: Neurosurgery;  Laterality: N/A;  Cervical four-five Anterior cervical decompression/diskectomy/fusion   APPENDECTOMY  1969   COLONOSCOPY  last done 2018   ELBOW SURGERY Right 2019   tendon replacement   HERNIA REPAIR     inguinal / abdominal - repaired as an infant    JOINT REPLACEMENT     KNEE ARTHROSCOPY Left last done oct 2013   x 3   KNEE SURGERY  yrs ago   right knee   LEFT HEART CATH AND CORONARY ANGIOGRAPHY N/A 11/14/2017   Procedure: LEFT HEART CATH AND CORONARY ANGIOGRAPHY;  Surgeon: [name], MD;  Location: [facility];  Service: Cardiovascular;  Laterality: N/A;   LUMBAR LAMINECTOMY WITH COFLEX 2 LEVEL N/A 09/12/2015   Procedure: L3-4 L4-5 Laminectomy with coflex;  Surgeon: [name], MD;  Location: [facility];  Service: Neurosurgery;  Laterality: N/A;  L3-4 L4-5 Laminectomy with coflex   NASAL SINUS SURGERY     multiple sinus surgery - 1980's    SKIN CANCER EXCISION  2023   TONSILLECTOMY  1969   TOTAL KNEE ARTHROPLASTY Left 10/22/2012   Procedure: TOTAL KNEE ARTHROPLASTY;  Surgeon: [name], MD;  Location: [facility];  Service: Orthopedics;  Laterality: Left;   TOTAL KNEE REVISION WITH SCAR DEBRIDEMENT/PATELLA REVISION WITH POLY EXCHANGE Left 05/18/2020   Procedure: Left knee excision saphenous nerve branch, neurectomy, left open scar excision with polyethlene revision;  Surgeon: [name], MD;  Location: [facility];  Service: Orthopedics;  Laterality: Left;  90 mins Spinal with adductor block   TRANSURETHRAL RESECTION OF PROSTATE N/A 01/31/2020   Procedure: TRANSURETHRAL RESECTION OF THE PROSTATE (TURP);  Surgeon: [name], MD;  Location: [facility];  Service: Urology;  Laterality: N/A;  75 MIS   UPPER GASTROINTESTINAL ENDOSCOPY      Current Outpatient Medications  Medication Sig Dispense Refill   amLODipine (NORVASC) 10 MG tablet Take 1 tablet (  10 mg total) by mouth daily. Please make overdue appt with Dr. Harrington Challenger before anymore refills. Thank you 2nd attempt 15 tablet 0   Biotin w/ Vitamins C & E (HAIR SKIN & NAILS GUMMIES PO) Take 2 tablets by mouth daily.     escitalopram (LEXAPRO) 20 MG tablet Take 20 mg by mouth daily.     esomeprazole (NEXIUM) 40 MG capsule TAKE 1 CAPSULE BY MOUTH EVERY DAY BEFORE BREAKFAST 90 capsule 1   Ferrous Sulfate (IRON) 325 (65 Fe) MG TABS Take 1 tablet (325 mg total) by mouth in the morning and at bedtime. 60 tablet 4   hydrochlorothiazide (HYDRODIURIL) 25 MG tablet Take 1 tablet (25 mg total) by mouth daily. Please make  overdue appt with Dr. Harrington Challenger before anymore refills. Thank you 2nd attempt 15 tablet 0   irbesartan (AVAPRO) 150 MG tablet Take 1.5 tablets (225 mg total) by mouth daily. 135 tablet 3   Melatonin 5 MG TABS Take 10 mg by mouth at bedtime as needed (sleep).     oxyCODONE-acetaminophen (PERCOCET) 10-325 MG tablet Take 1 tablet by mouth 4 (four) times daily.     rosuvastatin (CRESTOR) 10 MG tablet TAKE 1 TABLET BY MOUTH EVERY DAY 90 tablet 1   vitamin B-12 (CYANOCOBALAMIN) 500 MCG tablet Take 1,000 mcg by mouth daily.     vitamin C (ASCORBIC ACID) 500 MG tablet Take 1,000 mg by mouth daily.     zolpidem (AMBIEN) 10 MG tablet TAKE 1 TABLET BY MOUTH EVERYDAY AT BEDTIME (Patient taking differently: Take 10 mg by mouth at bedtime.) 30 tablet 2   acetaminophen (TYLENOL) 650 MG CR tablet Take 1,300 mg by mouth every 8 (eight) hours as needed for pain.     celecoxib (CELEBREX) 200 MG capsule Take 1 capsule (200 mg total) by mouth 2 (two) times daily. (Patient not taking: Reported on 03/14/2021) 60 capsule 0   cetirizine (ZYRTEC) 10 MG tablet Take 10 mg by mouth daily as needed for allergies.     Coenzyme Q10 (COQ10) 200 MG CAPS Take 200 mg by mouth daily.     cyclobenzaprine (FLEXERIL) 10 MG tablet Take 10 mg by mouth 3 (three) times daily as needed.     diclofenac (VOLTAREN) 75 MG EC tablet Take 75 mg by mouth 2 (two) times daily as needed.     fluticasone (FLONASE) 50 MCG/ACT nasal spray Place 2 sprays into both nostrils daily. 16 g 6   mupirocin ointment (BACTROBAN) 2 % Apply 1 application topically daily as needed (skin bumps).     Current Facility-Administered Medications  Medication Dose Route Frequency Provider Last Rate Last Admin   0.9 %  sodium chloride infusion  500 mL Intravenous Once Milus Banister, MD        Allergies as of 03/14/2021 - Review Complete 03/14/2021  Allergen Reaction Noted   Heparin Anaphylaxis    Succinylcholine chloride Other (See Comments)    Vancomycin Hives and Other  (See Comments) 10/15/2011   Penicillins Rash     Family History  Problem Relation Age of Onset   Ovarian cancer Mother    Diabetes Father    Cancer Brother        brain tumor   Colon cancer Neg Hx    Stomach cancer Neg Hx    Rectal cancer Neg Hx    Esophageal cancer Neg Hx    Liver cancer Neg Hx    Colon polyps Neg Hx     Social History   Socioeconomic  History   Marital status: Widowed    Spouse name: Not on file   Number of children: 3   Years of education: Not on file   Highest education level: Not on file  Occupational History   Occupation: retired  Tobacco Use   Smoking status: Never   Smokeless tobacco: Former    Types: Chew    Quit date: 02/26/2003  Vaping Use   Vaping Use: Never used  Substance and Sexual Activity   Alcohol use: No   Drug use: No   Sexual activity: Not on file  Other Topics Concern   Not on file  Social History Narrative   Wife works at cath lab at [employer] advanced breast cancer   Lives with wife in a 2 story home.  Retired.  Education: college.    Social Determinants of Health   Financial Resource Strain: Not on file  Food Insecurity: Not on file  Transportation Needs: Not on file  Physical Activity: Not on file  Stress: Not on file  Social Connections: Not on file  Intimate Partner Violence: Not on file     Physical Exam: BP (!) 145/89    Pulse 96    Temp 98.1 F (36.7 C)    Ht 6' (1.829 m)    Wt 225 lb (102.1 kg)    SpO2 100%    BMI 30.52 kg/m  Constitutional: generally well-appearing Psychiatric: alert and oriented x3 Lungs: CTA bilaterally Heart: no MCR  Assessment and plan: 71 y.o. male with heme + mild IDA, weight loss, chronic gerd, h.o adenomas  Colonoscoy and egd today  Care is appropriate for the ambulatory setting.  Owens Loffler, MD [facility] 03/14/2021, 8:54 AM

## 2021-03-14 NOTE — Progress Notes (Signed)
Called to room to assist during endoscopic procedure.  Patient ID and intended procedure confirmed with present staff. Received instructions for my participation in the procedure from the performing physician.  

## 2021-03-16 ENCOUNTER — Telehealth: Payer: Self-pay

## 2021-03-16 NOTE — Telephone Encounter (Signed)
°  Follow up Call-  Call back number 03/14/2021  Post procedure Call Back phone  # [phone]  Permission to leave phone message Yes  Some recent data might be hidden     Patient questions:  Do you have a fever, pain , or abdominal swelling? No. Pain Score  0 *  Have you tolerated food without any problems? Yes.    Have you been able to return to your normal activities? Yes.    Do you have any questions about your discharge instructions: Diet   No. Medications  No. Follow up visit  No.  Do you have questions or concerns about your Care? No.  Actions: * If pain score is 4 or above: No action needed, pain <4.

## 2021-03-28 ENCOUNTER — Other Ambulatory Visit: Payer: Self-pay

## 2021-03-28 DIAGNOSIS — R109 Unspecified abdominal pain: Secondary | ICD-10-CM

## 2021-03-28 DIAGNOSIS — R634 Abnormal weight loss: Secondary | ICD-10-CM

## 2021-04-04 ENCOUNTER — Encounter: Payer: Self-pay | Admitting: Gastroenterology

## 2021-04-04 ENCOUNTER — Encounter (HOSPITAL_BASED_OUTPATIENT_CLINIC_OR_DEPARTMENT_OTHER): Payer: Self-pay

## 2021-04-04 ENCOUNTER — Other Ambulatory Visit: Payer: Self-pay

## 2021-04-04 ENCOUNTER — Ambulatory Visit (HOSPITAL_BASED_OUTPATIENT_CLINIC_OR_DEPARTMENT_OTHER)
Admission: RE | Admit: 2021-04-04 | Discharge: 2021-04-04 | Disposition: A | Discharge status: Home / Self Care | Payer: 59 | Source: Ambulatory Visit | Attending: Gastroenterology | Admitting: Gastroenterology

## 2021-04-04 DIAGNOSIS — R634 Abnormal weight loss: Secondary | ICD-10-CM | POA: Insufficient documentation

## 2021-04-04 DIAGNOSIS — R109 Unspecified abdominal pain: Secondary | ICD-10-CM | POA: Insufficient documentation

## 2021-04-04 DIAGNOSIS — I700 Atherosclerosis of aorta: Secondary | ICD-10-CM | POA: Diagnosis not present

## 2021-04-04 LAB — POCT I-STAT CREATININE: Creatinine, Ser: 0.9 mg/dL (ref 0.61–1.24)

## 2021-04-04 MED ORDER — IOHEXOL 300 MG/ML  SOLN
100.0000 mL | Freq: Once | INTRAMUSCULAR | Status: AC | PRN
  Administered 2021-04-04: 100 mL via INTRAVENOUS

## 2021-04-09 DIAGNOSIS — G4700 Insomnia, unspecified: Secondary | ICD-10-CM | POA: Diagnosis not present

## 2021-04-09 DIAGNOSIS — F419 Anxiety disorder, unspecified: Secondary | ICD-10-CM | POA: Diagnosis not present

## 2021-04-09 DIAGNOSIS — F32A Depression, unspecified: Secondary | ICD-10-CM | POA: Diagnosis not present

## 2021-04-09 DIAGNOSIS — I10 Essential (primary) hypertension: Secondary | ICD-10-CM | POA: Diagnosis not present

## 2021-04-09 DIAGNOSIS — T148XXA Other injury of unspecified body region, initial encounter: Secondary | ICD-10-CM | POA: Diagnosis not present

## 2021-04-09 DIAGNOSIS — E119 Type 2 diabetes mellitus without complications: Secondary | ICD-10-CM | POA: Diagnosis not present

## 2021-04-09 DIAGNOSIS — E785 Hyperlipidemia, unspecified: Secondary | ICD-10-CM | POA: Diagnosis not present

## 2021-04-09 DIAGNOSIS — L089 Local infection of the skin and subcutaneous tissue, unspecified: Secondary | ICD-10-CM | POA: Diagnosis not present

## 2021-04-09 DIAGNOSIS — D509 Iron deficiency anemia, unspecified: Secondary | ICD-10-CM | POA: Diagnosis not present

## 2021-04-15 ENCOUNTER — Other Ambulatory Visit: Payer: Self-pay | Admitting: Internal Medicine

## 2021-05-27 ENCOUNTER — Encounter: Payer: Self-pay | Admitting: Registered Nurse

## 2021-05-29 ENCOUNTER — Encounter: Payer: Self-pay | Admitting: Gastroenterology

## 2021-05-29 ENCOUNTER — Ambulatory Visit (INDEPENDENT_AMBULATORY_CARE_PROVIDER_SITE_OTHER): Payer: 59 | Admitting: Gastroenterology

## 2021-05-29 VITALS — BP 130/80 | HR 82 | Ht 72.0 in | Wt 234.0 lb

## 2021-05-29 DIAGNOSIS — K259 Gastric ulcer, unspecified as acute or chronic, without hemorrhage or perforation: Secondary | ICD-10-CM | POA: Diagnosis not present

## 2021-05-29 MED ORDER — ESOMEPRAZOLE MAGNESIUM 40 MG PO CPDR: 40.0000 mg | DELAYED_RELEASE_CAPSULE | Freq: Every day | ORAL | 1 refills | Status: DC

## 2021-05-29 NOTE — Patient Instructions (Signed)
If you are age 70 or older, your body mass index should be between 23-30. Your Body mass index is 31.74 kg/m?Marland Kitchen If this is out of the aforementioned range listed, please consider follow up with your Primary Care Provider. ?________________________________________________________ ? ?The [facility] providers would like to encourage you to use [facility] to communicate with providers for non-urgent requests or questions.  Due to long hold times on the telephone, sending your provider a message by [facility] may be a faster and more efficient way to get a response.  Please allow 48 business hours for a response.  Please remember that this is for non-urgent requests.  ?_______________________________________________________ ? ?You have been scheduled for an endoscopy. Please follow written instructions given to you at your visit today. ?If you use inhalers (even only as needed), please bring them with you on the day of your procedure. ? ?Due to recent changes in healthcare laws, you may see the results of your imaging and laboratory studies on MyChart before your provider has had a chance to review them.  We understand that in some cases there may be results that are confusing or concerning to you. Not all laboratory results come back in the same time frame and the provider may be waiting for multiple results in order to interpret others.  Please give [place] 48 hours in order for your provider to thoroughly review all the results before contacting the office for clarification of your results.  ? ?We have sent the following medications to your pharmacy for you to pick up at your convenience: ? ?Nexium ? ?Thank you for entrusting me with your care and choosing [facility]. ? ?Dr Ardis Hughs ? ?

## 2021-05-29 NOTE — Progress Notes (Signed)
Review of pertinent gastrointestinal problems: ?1.  Mild iron deficiency anemia, dysphagia, weight loss evaluated endoscopically 02/2021 EGD found small distal gastric ulcer, biopsies negative for H. pylori.  Esophagus was biopsied proximally and distally and these biopsies were normal.  Colonoscopy 02/2021 found no polyps.  CT scan 03/2021 abdomen pelvis with IV and oral contrast showed no clear etiology of his symptoms.  He was recommended to completely stop NSAIDs and aspirin and take  PPI twice daily. ? ? ?HPI: ?This is a very pleasant 71 year old man whom I last saw about 2 or 3 months ago at the time of colonoscopy and upper endoscopy.  See those results summarized above ? ? ?Blood work 03/2021 hemoglobin 13.2, hemoglobin A1c 6.5, ferritin 18 ? ?His weight is up 9 pounds since his last office visit here about 6 months ago, same scale ? ?He is overall doing pretty well.  He has minor fatigue and mild generalized abdominal discomforts.  He stopped taking Celebrex for the most part.  He is still taking 81 mg aspirin once daily.  He is indeed taking Nexium 40 mg once daily.  He is taking over-the-counter iron once daily ? ?He started taking a protein shake from [facility] once daily. ? ?ROS: complete GI ROS as described in HPI, all other review negative. ? ?Constitutional:  No unintentional weight loss ? ? ?Past Medical History:  ?Diagnosis Date  ? Allergy   ? allergic rhinitis  ? Anxiety   ? Arthritis   ? R knee, hands , back oa  ? BPH (benign prostatic hyperplasia)   ? Cancer [facility])   ? 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 Complication of anesthesia   ? trouble waking up with succycholine, does not have enzyme to break down succycholine  ? Coronary artery disease   ? nonobstructive  ? Dizziness   ? MRI brain unremarkable 02/05/2015. resolved as of 01-27-2020  ? 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 face and forehead-- previously followed by Derm in [place]  ? History of migraine   ? none recent as of 01-27-2020  ?  History of ulcerative colitis   ? non recent flares  ? Hyperlipidemia   ? Hypertension   ? Pseudocholinesterase deficiency   ? Skin cancer   ? Sleep apnea   ? last sleep study 10 yrs ago,CPAP-  no current cpap use   ? ? ?Past Surgical History:  ?Procedure Laterality Date  ? acl and mensicus repair reconstruct Right 1980's  ? ANTERIOR CERVICAL DECOMP/DISCECTOMY FUSION  10/15/2011  ? Procedure: ANTERIOR CERVICAL DECOMPRESSION/DISCECTOMY FUSION 1 LEVEL;  Surgeon: [name], MD;  Location: [facility];  Service: Neurosurgery;  Laterality: N/A;  Cervical four-five Anterior cervical decompression/diskectomy/fusion  ? APPENDECTOMY  1969  ? COLONOSCOPY  last done 2018  ? ELBOW SURGERY Right 2019  ? tendon replacement  ? HERNIA REPAIR    ? inguinal / abdominal - repaired as an infant   ? JOINT REPLACEMENT    ? KNEE ARTHROSCOPY Left last done oct 2013  ? x 3  ? KNEE SURGERY  yrs ago  ? right knee  ? LEFT HEART CATH AND CORONARY ANGIOGRAPHY N/A 11/14/2017  ? Procedure: LEFT HEART CATH AND CORONARY ANGIOGRAPHY;  Surgeon: [name], MD;  Location: [facility];  Service: Cardiovascular;  Laterality: N/A;  ? LUMBAR LAMINECTOMY WITH COFLEX 2 LEVEL N/A 09/12/2015  ? Procedure: L3-4 L4-5 Laminectomy with coflex;  Surgeon: [name], MD;  Location: [facility];  Service: Neurosurgery;  Laterality: N/A;  L3-4 L4-5 Laminectomy with coflex  ? NASAL SINUS SURGERY    ? multiple sinus surgery - 1980's   ? SKIN CANCER EXCISION  2023  ? TONSILLECTOMY  1969  ? TOTAL KNEE ARTHROPLASTY Left 10/22/2012  ? Procedure: TOTAL KNEE ARTHROPLASTY;  Surgeon: [name], MD;  Location: [facility];  Service: Orthopedics;  Laterality: Left;  ? TOTAL KNEE REVISION WITH SCAR DEBRIDEMENT/PATELLA REVISION WITH POLY EXCHANGE Left 05/18/2020  ? Procedure: Left knee excision saphenous nerve branch, neurectomy, left open scar excision with polyethlene revision;  Surgeon: [name], MD;  Location: [facility];  Service: Orthopedics;   Laterality: Left;  90 mins ?Spinal with adductor block  ? TRANSURETHRAL RESECTION OF PROSTATE N/A 01/31/2020  ? Procedure: TRANSURETHRAL RESECTION OF THE PROSTATE (TURP);  Surgeon: [name], MD;  Location: [facility];  Service: Urology;  Laterality: N/A;  76 MIS  ? UPPER GASTROINTESTINAL ENDOSCOPY    ? ? ?Current Outpatient Medications  ?Medication Instructions  ? acetaminophen (TYLENOL) 1,300 mg, Oral, Every 8 hours PRN  ? amLODipine (NORVASC) 10 mg, Oral, Daily  ? Biotin w/ Vitamins C & E (HAIR SKIN & NAILS GUMMIES PO) 2 tablets, Oral, Daily  ? celecoxib (CELEBREX) 200 mg, Oral, 2 times daily  ? cetirizine (ZYRTEC) 10 mg, Oral, Daily PRN  ? CoQ10 200 mg, Oral, Daily  ? cyclobenzaprine (FLEXERIL) 10 mg, Oral, 3 times daily PRN  ? diclofenac (VOLTAREN) 75 mg, Oral, 2 times daily PRN  ? escitalopram (LEXAPRO) 10 mg, Oral, Daily  ? esomeprazole (NEXIUM) 40 MG capsule TAKE 1 CAPSULE BY MOUTH EVERY DAY BEFORE BREAKFAST  ? ferrous sulfate 325 mg, Oral, Daily with breakfast  ? fluticasone (FLONASE) 50 MCG/ACT nasal spray 2 sprays, Each Nare, Daily  ? hydrochlorothiazide (HYDRODIURIL) 25 mg, Oral, Daily, Please make overdue appt with Dr. Harrington Challenger before anymore refills. Thank you 2nd attempt  ? irbesartan (AVAPRO) 225 mg, Oral, Daily  ? melatonin 10 mg, Oral, At bedtime PRN  ? mupirocin ointment (BACTROBAN) 2 % 1 application., Topical, Daily PRN  ? oxyCODONE-acetaminophen (PERCOCET) 10-325 MG tablet 1 tablet, Oral, 4 times daily  ? rosuvastatin (CRESTOR) 10 MG tablet TAKE 1 TABLET BY MOUTH EVERY DAY  ? vitamin B-12 (CYANOCOBALAMIN) 1,000 mcg, Oral, Daily  ? vitamin C (ASCORBIC ACID) 1,000 mg, Oral, Daily  ? zolpidem (AMBIEN) 10 MG tablet TAKE 1 TABLET BY MOUTH EVERYDAY AT BEDTIME  ? ? ?Allergies as of 05/29/2021 - Review Complete 05/29/2021  ?Allergen Reaction Noted  ? Heparin Anaphylaxis   ? Succinylcholine chloride Other (See Comments)   ? Vancomycin Hives and Other (See Comments) 10/15/2011  ?  Penicillins Rash   ? ? ?Family History  ?Problem Relation Age of Onset  ? Ovarian cancer Mother   ? Diabetes Father   ? Cancer Brother   ?     brain tumor  ? Colon cancer Neg Hx   ? Stomach cancer Neg Hx   ? Rectal cancer Neg Hx   ? Esophageal cancer Neg Hx   ? Liver cancer Neg Hx   ? Colon polyps Neg Hx   ? ? ?Social History  ? ?Socioeconomic History  ? Marital status: Widowed  ?  Spouse name: Not on file  ? Number of children: 3  ? Years of education: Not on file  ? Highest education level: Not on file  ?Occupational History  ? Occupation: retired  ?Tobacco Use  ? Smoking status: Never  ?  Smokeless tobacco: Former  ?  Types: Chew  ?  Quit date: 02/26/2003  ?Vaping Use  ? Vaping Use: Never used  ?Substance and Sexual Activity  ? Alcohol use: No  ? Drug use: No  ? Sexual activity: Not on file  ?Other Topics Concern  ? Not on file  ?Social History Narrative  ? Wife works at cath lab at [employer] advanced breast cancer  ? Lives with wife in a 2 story home.  Retired.  Education: college.   ? ?Social Determinants of Health  ? ?Financial Resource Strain: Not on file  ?Food Insecurity: Not on file  ?Transportation Needs: Not on file  ?Physical Activity: Not on file  ?Stress: Not on file  ?Social Connections: Not on file  ?Intimate Partner Violence: Not on file  ? ? ? ?Physical Exam: ?BP 130/80   Pulse 82   Ht 6' (1.829 m)   Wt 234 lb (106.1 kg)   BMI 31.74 kg/m?  ?Constitutional: generally well-appearing ?Psychiatric: alert and oriented x3 ?Abdomen: soft, nontender, nondistended, no obvious ascites, no peritoneal signs, normal bowel sounds ?No peripheral edema noted in lower extremities ? ?Assessment and plan: ?71 y.o. male with improved iron deficiency anemia, gastric ulcer ? ?I think the ulcer in his stomach was probably from "unopposed" aspirin, Celebrex.  H. pylori biopsies were negative.  I explained to him that we should evaluate the ulcer site again to make sure that it is healing since he stopped taking Celebrex  and has been on proton pump inhibitor once daily.  We will arrange for EGD at his soonest convenience. ? ?He has been gaining weight, his very mild iron deficiency anemia is nearly completely resolved as long as he takes

## 2021-05-30 ENCOUNTER — Encounter: Payer: Self-pay | Admitting: Gastroenterology

## 2021-05-30 ENCOUNTER — Ambulatory Visit (AMBULATORY_SURGERY_CENTER): Payer: 59 | Admitting: Gastroenterology

## 2021-05-30 VITALS — BP 148/72 | HR 90 | Temp 98.3°F | Resp 12 | Ht 72.0 in | Wt 234.0 lb

## 2021-05-30 DIAGNOSIS — K2970 Gastritis, unspecified, without bleeding: Secondary | ICD-10-CM

## 2021-05-30 DIAGNOSIS — Z8719 Personal history of other diseases of the digestive system: Secondary | ICD-10-CM

## 2021-05-30 DIAGNOSIS — K259 Gastric ulcer, unspecified as acute or chronic, without hemorrhage or perforation: Secondary | ICD-10-CM

## 2021-05-30 MED ORDER — SODIUM CHLORIDE 0.9 % IV SOLN: 500.0000 mL | INTRAVENOUS | Status: DC

## 2021-05-30 NOTE — Progress Notes (Signed)
Pt in recovery with monitors in place, VSS. Report given to receiving RN. Bite guard was placed with pt awake to ensure comfort. No dental or soft tissue damage noted. 

## 2021-05-30 NOTE — Progress Notes (Signed)
?  The recent H&P (dated yesterday) was reviewed, the patient was examined and there is no change in the patients condition since that H&P was completed. ? ? ?Dylan [name]  05/30/2021, 8:36 AM ? ?

## 2021-05-30 NOTE — Patient Instructions (Signed)
YOU HAD AN ENDOSCOPIC PROCEDURE TODAY AT THE [facility]:   Refer to the procedure report that was given to you for any specific questions about what was found during the examination.  If the procedure report does not answer your questions, please call your gastroenterologist to clarify.  If you requested that your care partner not be given the details of your procedure findings, then the procedure report has been included in a sealed envelope for you to review at your convenience later.  YOU SHOULD EXPECT: Some feelings of bloating in the abdomen. Passage of more gas than usual.  Walking can help get rid of the air that was put into your GI tract during the procedure and reduce the bloating. If you had a lower endoscopy (such as a colonoscopy or flexible sigmoidoscopy) you may notice spotting of blood in your stool or on the toilet paper. If you underwent a bowel prep for your procedure, you may not have a normal bowel movement for a few days.  Please Note:  You might notice some irritation and congestion in your nose or some drainage.  This is from the oxygen used during your procedure.  There is no need for concern and it should clear up in a day or so.  SYMPTOMS TO REPORT IMMEDIATELY:    Following upper endoscopy (EGD)  Vomiting of blood or coffee ground material  New chest pain or pain under the shoulder blades  Painful or persistently difficult swallowing  New shortness of breath  Fever of 100F or higher  Black, tarry-looking stools  For urgent or emergent issues, a gastroenterologist can be reached at any hour by calling [phone]. Do not use MyChart messaging for urgent concerns.    DIET:  We do recommend a small meal at first, but then you may proceed to your regular diet.  Drink plenty of fluids but you should avoid alcoholic beverages for 24 hours.  ACTIVITY:  You should plan to take it easy for the rest of today and you should NOT DRIVE or use heavy machinery  until tomorrow (because of the sedation medicines used during the test).    FOLLOW UP: Our staff will call the number listed on your records 48-72 hours following your procedure to check on you and address any questions or concerns that you may have regarding the information given to you following your procedure. If we do not reach you, we will leave a message.  We will attempt to reach you two times.  During this call, we will ask if you have developed any symptoms of COVID 19. If you develop any symptoms (ie: fever, flu-like symptoms, shortness of breath, cough etc.) before then, please call [phone].  If you test positive for Covid 19 in the 2 weeks post procedure, please call and report this information to us.    If any biopsies were taken you will be contacted by phone or by letter within the next 1-3 weeks.  Please call us at [phone] if you have not heard about the biopsies in 3 weeks.    SIGNATURES/CONFIDENTIALITY: You and/or your care partner have signed paperwork which will be entered into your electronic medical record.  These signatures attest to the fact that that the information above on your After Visit Summary has been reviewed and is understood.  Full responsibility of the confidentiality of this discharge information lies with you and/or your care-partner. 

## 2021-05-30 NOTE — Op Note (Signed)
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 pylori negative ?Medicines:                Monitored Anesthesia Care ?Procedure:                Pre-Anesthesia Assessment: ?                          - Prior to the procedure, a History and Physical  ?                          was performed, and patient medications and  ?                          allergies were reviewed. The patient's tolerance of  ?                          previous anesthesia was also reviewed. The risks  ?                          and benefits of the procedure and the sedation  ?                          options and risks were discussed with the patient.  ?                          All questions were answered, and informed consent  ?                          was obtained. Prior Anticoagulants: The patient has  ?                          taken no previous anticoagulant or antiplatelet  ?                          agents. ASA Grade Assessment: III - A patient with  ?                          severe systemic disease. After reviewing the risks  ?                          and benefits, the patient was deemed in  ?                          satisfactory condition to undergo the procedure. ?                          After obtaining informed consent, the endoscope was  ?  passed under direct vision. Throughout the  ?                          procedure, the patient's blood pressure, pulse, and  ?                          oxygen saturations were monitored continuously. The  ?                          GIF [id number] #[id number] was introduced through the  ?                          mouth, and advanced to the second part of duodenum.  ?                          The upper  GI endoscopy was accomplished without  ?                          difficulty. The patient tolerated the procedure  ?                          well. ?Scope In: ?Scope Out: ?Findings:                 Mild inflammation was found in the gastric antrum. ?                          The previously noted distal gastric ulcer has  ?                          healed. ?                          The exam was otherwise without abnormality. ?Complications:            No immediate complications. Estimated blood loss:  ?                          None. ?Estimated Blood Loss:     Estimated blood loss: none. ?Impression:               - Mild, non-specific distal gastritis. ?                          - The previously noted distal gastric ulcer has  ?                          healed. ?                          - The examination was otherwise normal. ?Recommendation:           - Patient has a contact number available for  ?                          emergencies. The signs and symptoms of potential  ?  delayed complications were discussed with the  ?                          patient. Return to normal activities tomorrow.  ?                          Written discharge instructions were provided to the  ?                          patient. ?                          - Resume previous diet. ?                          - Continue present medications. ?Milus Banister, MD ?05/30/2021 9:47:23 AM ?This report has been signed electronically. ?

## 2021-06-04 ENCOUNTER — Telehealth: Payer: Self-pay

## 2021-06-04 NOTE — Telephone Encounter (Signed)
?  Follow up Call- ? ? ?  05/30/2021  ?  8:17 AM 03/14/2021  ?  7:56 AM  ?Call back number  ?Post procedure Call Back phone  # [phone]  ?Permission to leave phone message Yes Yes  ?  ? ?Patient questions: ? ?Do you have a fever, pain , or abdominal swelling? No. ?Pain Score  0 * ? ?Have you tolerated food without any problems? Yes.   ? ?Have you been able to return to your normal activities? Yes.   ? ?Do you have any questions about your discharge instructions: ?Diet   No. ?Medications  No. ?Follow up visit  No. ? ?Do you have questions or concerns about your Care? No. ? ?Actions: ?* If pain score is 4 or above: ?No action needed, pain <4. ? ? ?

## 2021-07-12 NOTE — Progress Notes (Signed)
Cardiology Office Note    Date:  07/13/2021   ID:  Dylan [name], DOB January 09, 1951, MRN [id number]  PCP:  Percell Belt, DO  Cardiologist:  Dr. Harrington Challenger  F/U of CAD, HTN  History of Present Illness:   Dylan [name] is a 71 y.o. male with past medical history of hypertension, hyperlipidemia, diabetes (diet-controlled), degenerative joint disease, GERD, sleep apnea on CPAP and mild CAD   Pt was admitted to [facility] in 2019 with chest pressure  Myovue was abnormal  He went on to have cath that showed minimal CAD   sI saw the pt in November 2021   Since then he has been  by Dylan [name] a couple times for BP  Since seen he denies CP   BP was very high the other day when he checked at home on 5/18 (170- 201)      Past Medical History:  Diagnosis Date   Allergy    allergic rhinitis   Anxiety    Arthritis    R knee, hands , back oa   BPH (benign prostatic hyperplasia)    Cancer (Dylan [name])    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 resolved as of 01-27-2020   History of melanoma 15 yrs ago   face and forehead-- previously followed by Derm in [place]   History of migraine    none recent as of 01-27-2020   History of ulcerative colitis    non recent flares   Hyperlipidemia    Hypertension    Pseudocholinesterase deficiency    Skin cancer    Sleep apnea    last sleep study 10 yrs ago,CPAP-  no current cpap use     Past Surgical History:  Procedure Laterality Date   acl and mensicus repair reconstruct Right 1980's   ANTERIOR CERVICAL DECOMP/DISCECTOMY FUSION  10/15/2011   Procedure: ANTERIOR CERVICAL DECOMPRESSION/DISCECTOMY FUSION 1 LEVEL;  Surgeon: Dylan Miss, MD;  Location: [facility];  Service: Neurosurgery;  Laterality: N/A;  Cervical four-five Anterior cervical  decompression/diskectomy/fusion   APPENDECTOMY  1969   COLONOSCOPY  last done 2018   ELBOW SURGERY Right 2019   tendon replacement   HERNIA REPAIR     inguinal / abdominal - repaired as an infant    JOINT REPLACEMENT     KNEE ARTHROSCOPY Left last done oct 2013   x 3   KNEE SURGERY  yrs ago   right knee   LEFT HEART CATH AND CORONARY ANGIOGRAPHY N/A 11/14/2017   Procedure: LEFT HEART CATH AND CORONARY ANGIOGRAPHY;  Surgeon: Dylan Booze, MD;  Location: [place] [facility];  Service: Cardiovascular;  Laterality: N/A;   LUMBAR LAMINECTOMY WITH COFLEX 2 LEVEL N/A 09/12/2015   Procedure: L3-4 L4-5 Laminectomy with coflex;  Surgeon: Dylan Miss, MD;  Location: [place] [facility];  Service: Neurosurgery;  Laterality: N/A;  L3-4 L4-5 Laminectomy with coflex   NASAL SINUS SURGERY     multiple sinus surgery - 1980's    SKIN CANCER EXCISION  2023   TONSILLECTOMY  1969   TOTAL KNEE ARTHROPLASTY Left 10/22/2012   Procedure: TOTAL KNEE ARTHROPLASTY;  Surgeon: Dylan Hai, MD;  Location: [facility];  Service: Orthopedics;  Laterality: Left;   TOTAL KNEE REVISION WITH SCAR DEBRIDEMENT/PATELLA  REVISION WITH POLY EXCHANGE Left 05/18/2020   Procedure: Left knee excision saphenous nerve branch, neurectomy, left open scar excision with polyethlene revision;  Surgeon: Dylan Cancel, MD;  Location: [facility];  Service: Orthopedics;  Laterality: Left;  90 mins Spinal with adductor block   TRANSURETHRAL RESECTION OF PROSTATE N/A 01/31/2020   Procedure: TRANSURETHRAL RESECTION OF THE PROSTATE (TURP);  Surgeon: Dylan Gallo, MD;  Location: [facility];  Service: Urology;  Laterality: N/A;  76 MIS   UPPER GASTROINTESTINAL ENDOSCOPY      Current Medications: Prior to Admission medications   Medication Sig Start Date End Date Taking? Authorizing Provider  nortriptyline (PAMELOR) 10 MG capsule Take 10 mg by mouth at bedtime.   Yes [provider]  amLODipine (NORVASC) 5 MG tablet  Take 1 tablet (5 mg total) by mouth daily. 07/17/17   Dylan Lima, MD  aspirin EC 81 MG tablet Take 1 tablet (81 mg total) by mouth daily. 10/24/12   Dylan Orleans, [name]  co-enzyme Q-10 30 MG capsule Take 30 mg by mouth daily.    [provider]  dutasteride (AVODART) 0.5 MG capsule TAKE 1 CAPSULE (0.5 MG TOTAL) BY MOUTH DAILY. 06/18/17   Dylan Lima, MD  esomeprazole (NEXIUM) 40 MG capsule TAKE ONE CAPSULE BY MOUTH EVERY DAY BEFORE BREAKFAST 05/19/17   Dylan Lima, MD  HYDROcodone-acetaminophen (NORCO) 10-325 MG tablet Take 1 tablet by mouth every 6 (six) hours as needed for severe pain. 12/04/17   Dylan Minium, MD  lidocaine (LIDODERM) 5 % Place 1 patch onto the skin daily. May wear up to 12 hours 11/02/17   [provider]  Melatonin 5 MG TABS Take 1 tablet by mouth at bedtime.     [provider]  mupirocin ointment (BACTROBAN) 2 % Apply 1 application topically 2 (two) times daily. 10/10/17   Dylan Minium, MD  naloxone [facility]) nasal spray 4 mg/0.1 mL Use as directed 07/22/17   Dylan Lima, MD  nitroGLYCERIN (NITROSTAT) 0.4 MG SL tablet Place 1 tablet (0.4 mg total) under the tongue every 5 (five) minutes x 3 doses as needed for chest pain. 11/14/17   Dylan [name], Dylan Luria, PA  rosuvastatin (CRESTOR) 10 MG tablet TAKE 1 TABLET BY MOUTH EVERY DAY 05/11/17   Dylan Lima, MD  tamsulosin (FLOMAX) 0.4 MG CAPS capsule Take 0.4 mg by mouth.    [provider]  valsartan (DIOVAN) 80 MG tablet TAKE 1 TABLET BY MOUTH EVERY DAY 12/02/17   Dylan Minium, MD  zolpidem (AMBIEN) 10 MG tablet TAKE 1 TABLET BY MOUTH EVERY DAY AT BEDTIME AS NEEDED 12/11/17   Dylan Minium, MD    Allergies:   Heparin, Succinylcholine chloride, Vancomycin, and Penicillins   Social History   Socioeconomic History   Marital status: Widowed    Spouse name: Not on file   Number of children: 3   Years of education: Not on file   Highest education level: Not  on file  Occupational History   Occupation: retired  Tobacco Use   Smoking status: Never   Smokeless tobacco: Former    Types: Chew    Quit date: 02/26/2003  Vaping Use   Vaping Use: Never used  Substance and Sexual Activity   Alcohol use: No    Comment: occasional beer   Drug use: No   Sexual activity: Not on file  Other Topics Concern   Not on file  Social History Narrative   Wife works  at cath lab at [employer] advanced breast cancer   Lives with wife in a 2 story home.  Retired.  Education: college.    Social Determinants of Health   Financial Resource Strain: Not on file  Food Insecurity: Not on file  Transportation Needs: Not on file  Physical Activity: Not on file  Stress: Not on file  Social Connections: Not on file     Family History:  The patient's family history includes Cancer in his brother; Diabetes in his father; Ovarian cancer in his mother.  ROS:   Please see the history of present illness.    ROS All other systems reviewed and are negative.   PHYSICAL EXAM:   VS:  BP (!) 144/84   Pulse 94   Ht 6' (1.829 m)   Wt 230 lb 6.4 oz (104.5 kg)   SpO2 96%   BMI 31.25 kg/m    GEN: Obese  71 yo  in no acute distress  HEENT: normal  Neck: JVP is normal    Cardiac: RRR; no murmurs, No LE  edema  Respiratory:  clear to auscultation bilaterally GI: soft, nontender, nondistended, + BS Skin: warm and dry, no rash Neuro:  Alert and Oriented x 3, Strength and sensation are intact Psych: euthymic mood, full affect  Wt Readings from Last 3 Encounters:  07/13/21 230 lb 6.4 oz (104.5 kg)  05/30/21 234 lb (106.1 kg)  05/29/21 234 lb (106.1 kg)      Studies/Labs Reviewed:   EKG:  EKG shows SR 94 bpm    Recent Labs: 04/04/2021: Creatinine, Ser 0.90   Lipid Panel    Component Value Date/Time   CHOL 164 01/14/2020 0740   TRIG 157 (H) 01/14/2020 0740   HDL 61 01/14/2020 0740   CHOLHDL 2.7 01/14/2020 0740   CHOLHDL 3 10/19/2018 0845   VLDL 15.8 10/19/2018  0845   LDLCALC 76 01/14/2020 0740   LDLCALC 83 04/10/2018 1529   LDLDIRECT 142.0 11/11/2016 1626    Additional studies/ records that were reviewed today include:   Stress test 11/12/17 IMPRESSION: 1. Large area of reversibility involving the mid and distal anterior, septal, and inferoapical walls, suspicious for inducible myocardial ischemia.   2. Mild left ventricular dilatation, without regional wall motion abnormality.   3. Left ventricular ejection fraction 59%   4. Non invasive risk stratification*: High  LEFT HEART CATH AND CORONARY ANGIOGRAPHY  10/2017  Conclusion     Prox LAD lesion is 10% stenosed. Nonobstructive CAD. The left ventricular systolic function is normal. LV end diastolic pressure is mildly elevated. LVEDP 18 mm Hg. The left ventricular ejection fraction is 55-65% by visual estimate. There is no aortic valve stenosis.   Continue aggressive preventive therapy.        ASSESSMENT & PLAN:   1.  CAD   Pt without CP   -False positive stress test. In 2019  Cath with 10%pLAD. Continue ASA and statin.   2. HTN  BP is still high   Will add carvedilol to regimen   3.125 bid  3. OSA on CPAP - compliant  4. HL   Keep on 80 mg lipitor      Will follow up in clinic in 4 wks to assess BP   Instructed pt to bring in cuff  Medication Adjustments/Labs and Tests Ordered: Current medicines are reviewed at length with the patient today.  Concerns regarding medicines are outlined above.  Medication changes, Labs and Tests ordered today are listed in the Patient  Instructions below. Patient Instructions  Medication Instructions:  Start Coreg 3.125 mg twice a day   *If you need a refill on your cardiac medications before your next appointment, please call your pharmacy*   Lab Work: none If you have labs (blood work) drawn today and your tests are completely normal, you will receive your results only by: [place] (if you have MyChart) OR A paper copy in  the mail If you have any lab test that is abnormal or we need to change your treatment, we will call you to review the results.   Testing/Procedures: none   Follow-Up: At [facility], you and your health needs are our priority.  As part of our continuing mission to provide you with exceptional heart care, we have created designated Provider Care Teams.  These Care Teams include your primary Cardiologist (physician) and Advanced Practice Providers (APPs -  Physician Assistants and Nurse Practitioners) who all work together to provide you with the care you need, when you need it.  We recommend signing up for the patient portal called "MyChart".  Sign up information is provided on this After Visit Summary.  MyChart is used to connect with patients for Virtual Visits (Telemedicine).  Patients are able to view lab/test results, encounter notes, upcoming appointments, etc.  Non-urgent messages can be sent to your provider as well.   To learn more about what you can do with MyChart, go to [email or web address].    Your next appointment:   4 week(s)  The format for your next appointment:   In Person  Provider:   Dorris Carnes, MD     Other Instructions   Important Information About Sugar         Signed, Dorris Carnes, MD  07/13/2021 10:01 PM    [address] [place], [place]  [address] Phone: [phone]; Fax: [phone]

## 2021-07-13 ENCOUNTER — Encounter: Payer: Self-pay | Admitting: Internal Medicine

## 2021-07-13 ENCOUNTER — Other Ambulatory Visit: Payer: Self-pay | Admitting: Internal Medicine

## 2021-07-13 ENCOUNTER — Ambulatory Visit (INDEPENDENT_AMBULATORY_CARE_PROVIDER_SITE_OTHER): Payer: Medicare HMO | Admitting: Internal Medicine

## 2021-07-13 DIAGNOSIS — I10 Essential (primary) hypertension: Secondary | ICD-10-CM

## 2021-07-13 MED ORDER — CARVEDILOL 3.125 MG PO TABS: 3.1250 mg | ORAL_TABLET | Freq: Two times a day (BID) | ORAL | 3 refills | Status: DC

## 2021-07-13 MED ORDER — HYDROCHLOROTHIAZIDE 25 MG PO TABS: 25.0000 mg | ORAL_TABLET | Freq: Every day | ORAL | 3 refills | Status: DC

## 2021-07-13 MED ORDER — AMLODIPINE BESYLATE 10 MG PO TABS: 10.0000 mg | ORAL_TABLET | Freq: Every day | ORAL | 3 refills | Status: DC

## 2021-07-13 NOTE — Patient Instructions (Signed)
Medication Instructions:  Start Coreg 3.125 mg twice a day   *If you need a refill on your cardiac medications before your next appointment, please call your pharmacy*   Lab Work: none If you have labs (blood work) drawn today and your tests are completely normal, you will receive your results only by: [place] (if you have MyChart) OR A paper copy in the mail If you have any lab test that is abnormal or we need to change your treatment, we will call you to review the results.   Testing/Procedures: none   Follow-Up: At [facility], you and your health needs are our priority.  As part of our continuing mission to provide you with exceptional heart care, we have created designated Provider Care Teams.  These Care Teams include your primary Cardiologist (physician) and Advanced Practice Providers (APPs -  Physician Assistants and Nurse Practitioners) who all work together to provide you with the care you need, when you need it.  We recommend signing up for the patient portal called "MyChart".  Sign up information is provided on this After Visit Summary.  MyChart is used to connect with patients for Virtual Visits (Telemedicine).  Patients are able to view lab/test results, encounter notes, upcoming appointments, etc.  Non-urgent messages can be sent to your provider as well.   To learn more about what you can do with MyChart, go to [email or web address].    Your next appointment:   4 week(s)  The format for your next appointment:   In Person  Provider:   Dorris Carnes, MD     Other Instructions   Important Information About Sugar

## 2021-07-16 NOTE — Telephone Encounter (Signed)
Outpatient Medication Detail   Disp Refills Start End   amLODipine (NORVASC) 10 MG tablet 90 tablet 3 07/13/2021    Sig - Route: Take 1 tablet (10 mg total) by mouth daily. - Oral   Sent to pharmacy as: amLODipine (NORVASC) 10 MG tablet   E-Prescribing Status: Receipt confirmed by pharmacy (07/13/2021 11:34 AM EDT)    Pharmacy  [id number]- [place], [place] - [address]

## 2021-07-29 ENCOUNTER — Other Ambulatory Visit: Payer: Self-pay | Admitting: Internal Medicine

## 2021-08-08 ENCOUNTER — Ambulatory Visit (INDEPENDENT_AMBULATORY_CARE_PROVIDER_SITE_OTHER): Payer: Medicare HMO | Admitting: Internal Medicine

## 2021-08-08 ENCOUNTER — Encounter: Payer: Self-pay | Admitting: Internal Medicine

## 2021-08-08 VITALS — BP 130/72 | HR 91 | Ht 72.0 in | Wt 228.4 lb

## 2021-08-08 DIAGNOSIS — I10 Essential (primary) hypertension: Secondary | ICD-10-CM | POA: Diagnosis not present

## 2021-08-08 DIAGNOSIS — Z79899 Other long term (current) drug therapy: Secondary | ICD-10-CM

## 2021-08-08 DIAGNOSIS — E785 Hyperlipidemia, unspecified: Secondary | ICD-10-CM | POA: Diagnosis not present

## 2021-08-08 DIAGNOSIS — E782 Mixed hyperlipidemia: Secondary | ICD-10-CM

## 2021-08-08 DIAGNOSIS — I2510 Atherosclerotic heart disease of native coronary artery without angina pectoris: Secondary | ICD-10-CM

## 2021-08-08 NOTE — Patient Instructions (Signed)
Medication Instructions:  Your physician recommends that you continue on your current medications as directed. Please refer to the Current Medication list given to you today.  *If you need a refill on your cardiac medications before your next appointment, please call your pharmacy*   Lab Work: CBC, BMET, URIC ACID, NMR, HGBA1C If you have labs (blood work) drawn today and your tests are completely normal, you will receive your results only by: [place] (if you have MyChart) OR A paper copy in the mail If you have any lab test that is abnormal or we need to change your treatment, we will call you to review the results.   Testing/Procedures: NONE   Follow-Up: At [facility], you and your health needs are our priority.  As part of our continuing mission to provide you with exceptional heart care, we have created designated Provider Care Teams.  These Care Teams include your primary Cardiologist (physician) and Advanced Practice Providers (APPs -  Physician Assistants and Nurse Practitioners) who all work together to provide you with the care you need, when you need it.  We recommend signing up for the patient portal called "MyChart".  Sign up information is provided on this After Visit Summary.  MyChart is used to connect with patients for Virtual Visits (Telemedicine).  Patients are able to view lab/test results, encounter notes, upcoming appointments, etc.  Non-urgent messages can be sent to your provider as well.   To learn more about what you can do with MyChart, go to [email or web address].    Your next appointment:   6 month(s)  The format for your next appointment:   In Person  Provider:   Dorris Carnes, MD     Other Instructions   Important Information About Sugar

## 2021-08-08 NOTE — Progress Notes (Signed)
Cardiology Office Note    Date:  08/08/2021   ID:  WRAY GOEHRING, DOB 1950-07-11, MRN [id number]  PCP:  Percell Belt, DO  Cardiologist:  Dr. Harrington Challenger  F/U of CAD, HTN  History of Present Illness:   Dylan [name] is a 71 y.o. male with past medical history of hypertension, hyperlipidemia, diabetes (diet-controlled), degenerative joint disease, GERD, sleep apnea on CPAP and mild CAD   Pt was admitted to [facility] in 2019 with chest pressure  Myovue was abnormal  He went on to have cath that showed minimal CAD   I last saw the patient in clinic in May 2023   at that visit I recomm he add carvedilol to his regimen     Since seen he has done well   He does says his bp has been in the 130s/  He is working on diet    Cut way back on beer.   Eating veggies    Trying box breathing  He denies dizziness   Energy level is good   No CP     Past Medical History:  Diagnosis Date   Allergy    allergic rhinitis   Anxiety    Arthritis    R knee, hands , back oa   BPH (benign prostatic hyperplasia)    Cancer ([place])    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 resolved as of 01-27-2020   History of melanoma 15 yrs ago   face and forehead-- previously followed by Derm in [place]   History of migraine    none recent as of 01-27-2020   History of ulcerative colitis    non recent flares   Hyperlipidemia    Hypertension    Pseudocholinesterase deficiency    Skin cancer    Sleep apnea    last sleep study 10 yrs ago,CPAP-  no current cpap use     Past Surgical History:  Procedure Laterality Date   acl and mensicus repair reconstruct Right 1980's   ANTERIOR CERVICAL DECOMP/DISCECTOMY FUSION  10/15/2011   Procedure: ANTERIOR CERVICAL DECOMPRESSION/DISCECTOMY FUSION 1 LEVEL;   Surgeon: [name], MD;  Location: [facility];  Service: Neurosurgery;  Laterality: N/A;  Cervical four-five Anterior cervical decompression/diskectomy/fusion   APPENDECTOMY  1969   COLONOSCOPY  last done 2018   ELBOW SURGERY Right 2019   tendon replacement   HERNIA REPAIR     inguinal / abdominal - repaired as an infant    JOINT REPLACEMENT     KNEE ARTHROSCOPY Left last done oct 2013   x 3   KNEE SURGERY  yrs ago   right knee   LEFT HEART CATH AND CORONARY ANGIOGRAPHY N/A 11/14/2017   Procedure: LEFT HEART CATH AND CORONARY ANGIOGRAPHY;  Surgeon: [name], MD;  Location: [facility];  Service: Cardiovascular;  Laterality: N/A;   LUMBAR LAMINECTOMY WITH COFLEX 2 LEVEL N/A 09/12/2015   Procedure: L3-4 L4-5 Laminectomy with coflex;  Surgeon: [name], MD;  Location: [facility];  Service: Neurosurgery;  Laterality: N/A;  L3-4 L4-5 Laminectomy with coflex   NASAL SINUS SURGERY     multiple sinus surgery - 1980's    SKIN CANCER EXCISION  2023   TONSILLECTOMY  1969  TOTAL KNEE ARTHROPLASTY Left 10/22/2012   Procedure: TOTAL KNEE ARTHROPLASTY;  Surgeon: [name], MD;  Location: [facility];  Service: Orthopedics;  Laterality: Left;   TOTAL KNEE REVISION WITH SCAR DEBRIDEMENT/PATELLA REVISION WITH POLY EXCHANGE Left 05/18/2020   Procedure: Left knee excision saphenous nerve branch, neurectomy, left open scar excision with polyethlene revision;  Surgeon: [name], MD;  Location: [facility];  Service: Orthopedics;  Laterality: Left;  90 mins Spinal with adductor block   TRANSURETHRAL RESECTION OF PROSTATE N/A 01/31/2020   Procedure: TRANSURETHRAL RESECTION OF THE PROSTATE (TURP);  Surgeon: [name], MD;  Location: [facility];  Service: Urology;  Laterality: N/A;  75 MIS   UPPER GASTROINTESTINAL ENDOSCOPY      Current Medications: Prior to Admission medications   Medication Sig Start Date End Date Taking? Authorizing Provider   nortriptyline (PAMELOR) 10 MG capsule Take 10 mg by mouth at bedtime.   Yes [provider]  amLODipine (NORVASC) 5 MG tablet Take 1 tablet (5 mg total) by mouth daily. 07/17/17   [name], MD  aspirin EC 81 MG tablet Take 1 tablet (81 mg total) by mouth daily. 10/24/12   [name], [name]  co-enzyme Q-10 30 MG capsule Take 30 mg by mouth daily.    [provider]  dutasteride (AVODART) 0.5 MG capsule TAKE 1 CAPSULE (0.5 MG TOTAL) BY MOUTH DAILY. 06/18/17   [name], MD  esomeprazole (NEXIUM) 40 MG capsule TAKE ONE CAPSULE BY MOUTH EVERY DAY BEFORE BREAKFAST 05/19/17   [name], MD  HYDROcodone-acetaminophen (NORCO) 10-325 MG tablet Take 1 tablet by mouth every 6 (six) hours as needed for severe pain. 12/04/17   [name], MD  lidocaine (LIDODERM) 5 % Place 1 patch onto the skin daily. May wear up to 12 hours 11/02/17   [provider]  Melatonin 5 MG TABS Take 1 tablet by mouth at bedtime.     [provider]  mupirocin ointment (BACTROBAN) 2 % Apply 1 application topically 2 (two) times daily. 10/10/17   [name], MD  naloxone [facility]) nasal spray 4 mg/0.1 mL Use as directed 07/22/17   [name], MD  nitroGLYCERIN (NITROSTAT) 0.4 MG SL tablet Place 1 tablet (0.4 mg total) under the tongue every 5 (five) minutes x 3 doses as needed for chest pain. 11/14/17   Bhagat, [name], PA  rosuvastatin (CRESTOR) 10 MG tablet TAKE 1 TABLET BY MOUTH EVERY DAY 05/11/17   [name], MD  tamsulosin (FLOMAX) 0.4 MG CAPS capsule Take 0.4 mg by mouth.    [provider]  valsartan (DIOVAN) 80 MG tablet TAKE 1 TABLET BY MOUTH EVERY DAY 12/02/17   [name], MD  zolpidem (AMBIEN) 10 MG tablet TAKE 1 TABLET BY MOUTH EVERY DAY AT BEDTIME AS NEEDED 12/11/17   [name], MD    Allergies:   Heparin, Succinylcholine chloride, Vancomycin, and Penicillins   Social History   Socioeconomic History   Marital  status: Widowed    Spouse name: Not on file   Number of children: 3   Years of education: Not on file   Highest education level: Not on file  Occupational History   Occupation: retired  Tobacco Use   Smoking status: Never   Smokeless tobacco: Former    Types: Chew    Quit date: 02/26/2003  Vaping Use   Vaping Use: Never used  Substance and Sexual Activity   Alcohol use: No  Comment: occasional beer   Drug use: No   Sexual activity: Not on file  Other Topics Concern   Not on file  Social History Narrative   Wife works at cath lab at [employer] advanced breast cancer   Lives with wife in a 2 story home.  Retired.  Education: college.    Social Determinants of Health   Financial Resource Strain: Low Risk  (10/04/2019)   Overall Financial Resource Strain (CARDIA)    Difficulty of Paying Living Expenses: Not hard at all  Food Insecurity: No Food Insecurity (10/04/2019)   Hunger Vital Sign    Worried About Running Out of Food in the Last Year: Never true    Ran Out of Food in the Last Year: Never true  Transportation Needs: No Transportation Needs (10/04/2019)   PRAPARE - Hydrologist (Medical): No    Lack of Transportation (Non-Medical): No  Physical Activity: Sufficiently Active (10/04/2019)   Exercise Vital Sign    Days of Exercise per Week: 7 days    Minutes of Exercise per Session: 40 min  Stress: No Stress Concern Present (10/04/2019)   [place]    Feeling of Stress : Not at all  Social Connections: Moderately Integrated (10/04/2019)   Social Connection and Isolation Panel [NHANES]    Frequency of Communication with Friends and Family: More than three times a week    Frequency of Social Gatherings with Friends and Family: More than three times a week    Attends Religious Services: More than 4 times per year    Active Member of [employer] or Organizations: Yes    Attends Archivist  Meetings: More than 4 times per year    Marital Status: Widowed     Family History:  The patient's family history includes Cancer in his brother; Diabetes in his father; Ovarian cancer in his mother.  ROS:   Please see the history of present illness.    ROS All other systems reviewed and are negative.   PHYSICAL EXAM:   VS:  BP 130/72   Pulse 91   Ht 6' (1.829 m)   Wt 228 lb 6.4 oz (103.6 kg)   SpO2 99%   BMI 30.98 kg/m    GEN: Obese  71 yo  in no acute distress  HEENT: normal  Neck: JVP is normal    Cardiac: RRR; no murmurs, No LE  edema  Respiratory:  clear to auscultation bilaterally GI: soft, nontender, nondistended, + BS Skin: warm and dry, no rash Neuro:  Alert and Oriented x 3, Strength and sensation are intact Psych: euthymic mood, full affect  Wt Readings from Last 3 Encounters:  08/08/21 228 lb 6.4 oz (103.6 kg)  07/13/21 230 lb 6.4 oz (104.5 kg)  05/30/21 234 lb (106.1 kg)      Studies/Labs Reviewed:   EKG:  EKG shows SR 94 bpm    Recent Labs: 04/04/2021: Creatinine, Ser 0.90   Lipid Panel    Component Value Date/Time   CHOL 164 01/14/2020 0740   TRIG 157 (H) 01/14/2020 0740   HDL 61 01/14/2020 0740   CHOLHDL 2.7 01/14/2020 0740   CHOLHDL 3 10/19/2018 0845   VLDL 15.8 10/19/2018 0845   LDLCALC 76 01/14/2020 0740   LDLCALC 83 04/10/2018 1529   LDLDIRECT 142.0 11/11/2016 1626    Additional studies/ records that were reviewed today include:   Stress test 11/12/17 IMPRESSION: 1. Large area of  reversibility involving the mid and distal anterior, septal, and inferoapical walls, suspicious for inducible myocardial ischemia.   2. Mild left ventricular dilatation, without regional wall motion abnormality.   3. Left ventricular ejection fraction 59%   4. Non invasive risk stratification*: High  LEFT HEART CATH AND CORONARY ANGIOGRAPHY  10/2017  Conclusion     Prox LAD lesion is 10% stenosed. Nonobstructive CAD. The left ventricular systolic  function is normal. LV end diastolic pressure is mildly elevated. LVEDP 18 mm Hg. The left ventricular ejection fraction is 55-65% by visual estimate. There is no aortic valve stenosis.   Continue aggressive preventive therapy.        ASSESSMENT & PLAN:   1  HTN   BP better but not optimal   Would recomm increasing to carvedilol 6.25 bid    He will email in BP readings in 1 month  2 .  CAD   Denies CP   -False positive stress test. In 2019  Cath with 10%pLAD. Continue ASA and statin.   3. OSA   Continue CPAP     4. HL   Keep on 80 mg lipitor      Will get lipomed today     Will check CBC, TSH, uric acid, A1C along with lipids     Tentative folow up next fall if BP OK  Medication Adjustments/Labs and Tests Ordered: Current medicines are reviewed at length with the patient today.  Concerns regarding medicines are outlined above.  Medication changes, Labs and Tests ordered today are listed in the Patient Instructions below. There are no Patient Instructions on file for this visit.   Signed, Dorris Carnes, MD  08/08/2021 10:21 AM    [address], [place], [place]  [address] Phone: [phone]; Fax: [phone]

## 2021-08-09 LAB — NMR, LIPOPROFILE
Cholesterol, Total: 159 mg/dL (ref 100–199)
HDL Particle Number: 40.7 umol/L (ref >=30.5)
HDL-C: 53 mg/dL (ref >39)
LDL Particle Number: 1143 nmol/L — ABNORMAL HIGH (ref <1000)
LDL Size: 20.9 nm (ref >20.5)
LDL-C (NIH Calc): 84 mg/dL (ref 0–99)
LP-IR Score: 67 — ABNORMAL HIGH (ref <=45)
Small LDL Particle Number: 580 nmol/L — ABNORMAL HIGH (ref <=527)
Triglycerides: 126 mg/dL (ref 0–149)

## 2021-08-09 LAB — BASIC METABOLIC PANEL
BUN/Creatinine Ratio: 26 — ABNORMAL HIGH (ref 10–24)
BUN: 26 mg/dL (ref 8–27)
CO2: 24 mmol/L (ref 20–29)
Calcium: 9.6 mg/dL (ref 8.6–10.2)
Chloride: 99 mmol/L (ref 96–106)
Creatinine, Ser: 1.01 mg/dL (ref 0.76–1.27)
Glucose: 133 mg/dL — ABNORMAL HIGH (ref 70–99)
Potassium: 5 mmol/L (ref 3.5–5.2)
Sodium: 136 mmol/L (ref 134–144)
eGFR: 81 mL/min/{1.73_m2} (ref >59)

## 2021-08-09 LAB — CBC
Hematocrit: 39.2 % (ref 37.5–51.0)
Hemoglobin: 13.5 g/dL (ref 13.0–17.7)
MCH: 29.5 pg (ref 26.6–33.0)
MCHC: 34.4 g/dL (ref 31.5–35.7)
MCV: 86 fL (ref 79–97)
Platelets: 314 10*3/uL (ref 150–450)
RBC: 4.58 x10E6/uL (ref 4.14–5.80)
RDW: 13.2 % (ref 11.6–15.4)
WBC: 10.9 10*3/uL — ABNORMAL HIGH (ref 3.4–10.8)

## 2021-08-09 LAB — URIC ACID: Uric Acid: 6.9 mg/dL (ref 3.8–8.4)

## 2021-08-09 LAB — HEMOGLOBIN A1C
Est. average glucose Bld gHb Est-mCnc: 140 mg/dL
Hgb A1c MFr Bld: 6.5 % — ABNORMAL HIGH (ref 4.8–5.6)

## 2021-08-09 LAB — TSH: TSH: 1.34 u[IU]/mL (ref 0.450–4.500)

## 2021-08-13 ENCOUNTER — Telehealth: Payer: Self-pay

## 2021-08-13 NOTE — Telephone Encounter (Signed)
Spoke with the pt re: his labwork and he reports that he is only taking Crestor 10 mg daily.. Will forward back to Dr Harrington Challenger for recommendations re: his meds. I advised the pt I will send his results to his My Chart to review and will call him back after I talk with Dr Harrington Challenger.

## 2021-08-13 NOTE — Telephone Encounter (Signed)
Would increase Crestor to 20 mg     Follow up lipomed in 8 wks

## 2021-08-13 NOTE — Telephone Encounter (Signed)
-----   Message from Fay Records, MD sent at 08/10/2021  6:33 AM EDT ----- Hgb A1C is a little high  6.5  Try to limit carbs Uric acid is normal.  Still, can be better if limit carbs Thyroid function is normal CBC  Overall OK  WBC minimally increased   Not significant Lipids   Need to be better   Please confirm mds (note says lipitor 80  Med list said Crestor 10    If on lipitor I would add Zetia 10   Repeat lipomed in 8 wks  with liver panel

## 2021-08-14 DIAGNOSIS — E785 Hyperlipidemia, unspecified: Secondary | ICD-10-CM

## 2021-08-14 DIAGNOSIS — Z79899 Other long term (current) drug therapy: Secondary | ICD-10-CM

## 2021-08-14 MED ORDER — ROSUVASTATIN CALCIUM 20 MG PO TABS: 20.0000 mg | ORAL_TABLET | Freq: Every day | ORAL | 3 refills | Status: DC

## 2021-08-14 NOTE — Telephone Encounter (Signed)
My Chart message sent to the pt. Will follow up with him.

## 2021-08-16 NOTE — Telephone Encounter (Signed)
Left a message for the pt to call back to go over the My Chart message I previously sent him re: increasing his Crestor.   Need lab date.

## 2021-08-17 NOTE — Telephone Encounter (Addendum)
Communicated with the pt via My Chart will close this encounter.   Pt to have repeat fasting labs 10/19/21.

## 2021-08-18 ENCOUNTER — Other Ambulatory Visit: Payer: Self-pay | Admitting: Family Medicine

## 2021-08-18 DIAGNOSIS — E785 Hyperlipidemia, unspecified: Secondary | ICD-10-CM

## 2021-09-10 MED ORDER — CARVEDILOL 3.125 MG PO TABS: 3.1250 mg | ORAL_TABLET | Freq: Two times a day (BID) | ORAL | 3 refills | Status: DC

## 2021-09-10 NOTE — Addendum Note (Signed)
Addended by: Stephani Police on: 09/10/2021 11:28 AM   Modules accepted: Orders

## 2021-09-12 MED ORDER — CARVEDILOL 6.25 MG PO TABS: 6.2500 mg | ORAL_TABLET | Freq: Two times a day (BID) | ORAL | 3 refills | Status: DC

## 2021-09-12 NOTE — Addendum Note (Signed)
Addended by: Antonieta Iba on: 09/12/2021 12:19 PM   Modules accepted: Orders

## 2021-10-19 ENCOUNTER — Other Ambulatory Visit: Payer: Medicare HMO

## 2021-11-03 ENCOUNTER — Other Ambulatory Visit: Payer: Self-pay | Admitting: Family Medicine

## 2021-11-03 DIAGNOSIS — E785 Hyperlipidemia, unspecified: Secondary | ICD-10-CM

## 2021-11-28 ENCOUNTER — Other Ambulatory Visit: Payer: Self-pay | Admitting: Gastroenterology

## 2021-11-28 NOTE — Telephone Encounter (Signed)
Good morning Dr Tarri Glenn,  This is a pt of Dr Ardis Hughs. I am sending you the refill as you are DOD am.  Please advise.

## 2022-01-28 ENCOUNTER — Other Ambulatory Visit: Payer: Self-pay | Admitting: Internal Medicine

## 2022-01-28 DIAGNOSIS — I10 Essential (primary) hypertension: Secondary | ICD-10-CM

## 2022-02-11 ENCOUNTER — Ambulatory Visit: Payer: Medicare HMO | Admitting: Internal Medicine

## 2022-02-23 ENCOUNTER — Other Ambulatory Visit: Payer: Self-pay | Admitting: Internal Medicine

## 2022-02-26 NOTE — Progress Notes (Deleted)
Cardiology Office Note    Date:  02/26/2022   ID:  [name], DOB [birth year], MRN [id number]  PCP:  Percell Belt, DO  Cardiologist:  Dr. Harrington Challenger  F/U of CAD, HTN  History of Present Illness:   Dylan [name] is a 72 y.o. male with past medical history of hypertension, hyperlipidemia, diabetes (diet-controlled), degenerative joint disease, GERD, sleep apnea on CPAP and mild CAD   Pt was admitted to [facility] in 2019 with chest pressure  Myovue was abnormal  He went on to have cath that showed minimal CAD   I last saw the patient in clinic in May 2023   at that visit I recomm he add carvedilol to his regimen     Since seen he has done well   He does says his bp has been in the 130s/  He is working on diet    Cut way back on beer.   Eating veggies    Trying box breathing  He denies dizziness   Energy level is good   No CP     The pt wa last in clinic in Jne 2023    Past Medical History:  Diagnosis Date   Allergy    allergic rhinitis   Anxiety    Arthritis    R knee, hands , back oa   BPH (benign prostatic hyperplasia)    Cancer ([place])    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 resolved as of 01-27-2020   History of melanoma 15 yrs ago   face and forehead-- previously followed by Derm in [place]   History of migraine    none recent as of 01-27-2020   History of ulcerative colitis    non recent flares   Hyperlipidemia    Hypertension    Pseudocholinesterase deficiency    Skin cancer    Sleep apnea    last sleep study 10 yrs ago,CPAP-  no current cpap use     Past Surgical History:  Procedure Laterality Date   acl and mensicus repair reconstruct Right 1980's   ANTERIOR CERVICAL DECOMP/DISCECTOMY FUSION  10/15/2011   Procedure: ANTERIOR CERVICAL  DECOMPRESSION/DISCECTOMY FUSION 1 LEVEL;  Surgeon: [name], MD;  Location: [facility];  Service: Neurosurgery;  Laterality: N/A;  Cervical four-five Anterior cervical decompression/diskectomy/fusion   APPENDECTOMY  1969   COLONOSCOPY  last done 2018   ELBOW SURGERY Right 2019   tendon replacement   HERNIA REPAIR     inguinal / abdominal - repaired as an infant    JOINT REPLACEMENT     KNEE ARTHROSCOPY Left last done oct 2013   x 3   KNEE SURGERY  yrs ago   right knee   LEFT HEART CATH AND CORONARY ANGIOGRAPHY N/A 11/14/2017   Procedure: LEFT HEART CATH AND CORONARY ANGIOGRAPHY;  Surgeon: [name], MD;  Location: [facility];  Service: Cardiovascular;  Laterality: N/A;   LUMBAR LAMINECTOMY WITH COFLEX 2 LEVEL N/A 09/12/2015   Procedure: L3-4 L4-5 Laminectomy with coflex;  Surgeon: [name], MD;  Location: [place] [facility];  Service: Neurosurgery;  Laterality: N/A;  L3-4 L4-5 Laminectomy with coflex   NASAL SINUS SURGERY     multiple sinus surgery - 1980's  SKIN CANCER EXCISION  2023   TONSILLECTOMY  1969   TOTAL KNEE ARTHROPLASTY Left 10/22/2012   Procedure: TOTAL KNEE ARTHROPLASTY;  Surgeon: [name], MD;  Location: [facility];  Service: Orthopedics;  Laterality: Left;   TOTAL KNEE REVISION WITH SCAR DEBRIDEMENT/PATELLA REVISION WITH POLY EXCHANGE Left 05/18/2020   Procedure: Left knee excision saphenous nerve branch, neurectomy, left open scar excision with polyethlene revision;  Surgeon: [name], MD;  Location: [facility];  Service: Orthopedics;  Laterality: Left;  90 mins Spinal with adductor block   TRANSURETHRAL RESECTION OF PROSTATE N/A 01/31/2020   Procedure: TRANSURETHRAL RESECTION OF THE PROSTATE (TURP);  Surgeon: [name], MD;  Location: [facility];  Service: Urology;  Laterality: N/A;  75 MIS   UPPER GASTROINTESTINAL ENDOSCOPY      Current Medications: Prior to Admission medications   Medication Sig Start Date End  Date Taking? Authorizing Provider  nortriptyline (PAMELOR) 10 MG capsule Take 10 mg by mouth at bedtime.   Yes [provider]  amLODipine (NORVASC) 5 MG tablet Take 1 tablet (5 mg total) by mouth daily. 07/17/17   [name], MD  aspirin EC 81 MG tablet Take 1 tablet (81 mg total) by mouth daily. 10/24/12   [name], [name]  co-enzyme Q-10 30 MG capsule Take 30 mg by mouth daily.    [provider]  dutasteride (AVODART) 0.5 MG capsule TAKE 1 CAPSULE (0.5 MG TOTAL) BY MOUTH DAILY. 06/18/17   [name], MD  esomeprazole (NEXIUM) 40 MG capsule TAKE ONE CAPSULE BY MOUTH EVERY DAY BEFORE BREAKFAST 05/19/17   [name], MD  HYDROcodone-acetaminophen (NORCO) 10-325 MG tablet Take 1 tablet by mouth every 6 (six) hours as needed for severe pain. 12/04/17   [name], MD  lidocaine (LIDODERM) 5 % Place 1 patch onto the skin daily. May wear up to 12 hours 11/02/17   [provider]  Melatonin 5 MG TABS Take 1 tablet by mouth at bedtime.     [provider]  mupirocin ointment (BACTROBAN) 2 % Apply 1 application topically 2 (two) times daily. 10/10/17   [name], MD  naloxone [facility]) nasal spray 4 mg/0.1 mL Use as directed 07/22/17   [name], MD  nitroGLYCERIN (NITROSTAT) 0.4 MG SL tablet Place 1 tablet (0.4 mg total) under the tongue every 5 (five) minutes x 3 doses as needed for chest pain. 11/14/17   Bhagat, [name], PA  rosuvastatin (CRESTOR) 10 MG tablet TAKE 1 TABLET BY MOUTH EVERY DAY 05/11/17   [name], MD  tamsulosin (FLOMAX) 0.4 MG CAPS capsule Take 0.4 mg by mouth.    [provider]  valsartan (DIOVAN) 80 MG tablet TAKE 1 TABLET BY MOUTH EVERY DAY 12/02/17   [name], MD  zolpidem (AMBIEN) 10 MG tablet TAKE 1 TABLET BY MOUTH EVERY DAY AT BEDTIME AS NEEDED 12/11/17   [name], MD    Allergies:   Heparin, Succinylcholine chloride, Vancomycin, and Penicillins   Social History    Socioeconomic History   Marital status: Widowed    Spouse name: Not on file   Number of children: 3   Years of education: Not on file   Highest education level: Not on file  Occupational History   Occupation: retired  Tobacco Use   Smoking status: Never   Smokeless tobacco: Former    Types: Chew    Quit date: 02/26/2003  Vaping Use   Vaping Use:  Never used  Substance and Sexual Activity   Alcohol use: No    Comment: occasional beer   Drug use: No   Sexual activity: Not on file  Other Topics Concern   Not on file  Social History Narrative   Wife works at cath lab at [employer] advanced breast cancer   Lives with wife in a 2 story home.  Retired.  Education: college.    Social Determinants of Health   Financial Resource Strain: Low Risk  (10/04/2019)   Overall Financial Resource Strain (CARDIA)    Difficulty of Paying Living Expenses: Not hard at all  Food Insecurity: No Food Insecurity (10/04/2019)   Hunger Vital Sign    Worried About Running Out of Food in the Last Year: Never true    Ran Out of Food in the Last Year: Never true  Transportation Needs: No Transportation Needs (10/04/2019)   PRAPARE - Hydrologist (Medical): No    Lack of Transportation (Non-Medical): No  Physical Activity: Sufficiently Active (10/04/2019)   Exercise Vital Sign    Days of Exercise per Week: 7 days    Minutes of Exercise per Session: 40 min  Stress: No Stress Concern Present (10/04/2019)   [place]    Feeling of Stress : Not at all  Social Connections: Moderately Integrated (10/04/2019)   Social Connection and Isolation Panel [NHANES]    Frequency of Communication with Friends and Family: More than three times a week    Frequency of Social Gatherings with Friends and Family: More than three times a week    Attends Religious Services: More than 4 times per year    Active Member of [employer] or Organizations:  Yes    Attends Archivist Meetings: More than 4 times per year    Marital Status: Widowed     Family History:  The patient's family history includes Cancer in his brother; Diabetes in his father; Ovarian cancer in his mother.  ROS:   Please see the history of present illness.    ROS All other systems reviewed and are negative.   PHYSICAL EXAM:   VS:  There were no vitals taken for this visit.   GEN: Obese  72 yo  in no acute distress  HEENT: normal  Neck: JVP is normal    Cardiac: RRR; no murmurs, No LE  edema  Respiratory:  clear to auscultation bilaterally GI: soft, nontender, nondistended, + BS Skin: warm and dry, no rash Neuro:  Alert and Oriented x 3, Strength and sensation are intact Psych: euthymic mood, full affect  Wt Readings from Last 3 Encounters:  08/08/21 228 lb 6.4 oz (103.6 kg)  07/13/21 230 lb 6.4 oz (104.5 kg)  05/30/21 234 lb (106.1 kg)      Studies/Labs Reviewed:   EKG:  EKG shows SR 94 bpm    Recent Labs: 08/08/2021: BUN 26; Creatinine, Ser 1.01; Hemoglobin 13.5; Platelets 314; Potassium 5.0; Sodium 136; TSH 1.340   Lipid Panel    Component Value Date/Time   CHOL 164 01/14/2020 0740   TRIG 157 (H) 01/14/2020 0740   HDL 61 01/14/2020 0740   CHOLHDL 2.7 01/14/2020 0740   CHOLHDL 3 10/19/2018 0845   VLDL 15.8 10/19/2018 0845   LDLCALC 76 01/14/2020 0740   LDLCALC 83 04/10/2018 1529   LDLDIRECT 142.0 11/11/2016 1626    Additional studies/ records that were reviewed today include:   Stress test 11/12/17 IMPRESSION:  1. Large area of reversibility involving the mid and distal anterior, septal, and inferoapical walls, suspicious for inducible myocardial ischemia.   2. Mild left ventricular dilatation, without regional wall motion abnormality.   3. Left ventricular ejection fraction 59%   4. Non invasive risk stratification*: High  LEFT HEART CATH AND CORONARY ANGIOGRAPHY  10/2017  Conclusion     Prox LAD lesion is 10%  stenosed. Nonobstructive CAD. The left ventricular systolic function is normal. LV end diastolic pressure is mildly elevated. LVEDP 18 mm Hg. The left ventricular ejection fraction is 55-65% by visual estimate. There is no aortic valve stenosis.   Continue aggressive preventive therapy.        ASSESSMENT & PLAN:   1  HTN   BP better but not optimal   Would recomm increasing to carvedilol 6.25 bid    He will email in BP readings in 1 month  2 .  CAD   Denies CP   -False positive stress test. In 2019  Cath with 10%pLAD. Continue ASA and statin.   3. OSA   Continue CPAP     4. HL   Keep on 80 mg lipitor      Will get lipomed today     Will check CBC, TSH, uric acid, A1C along with lipids     Tentative folow up next fall if BP OK  Medication Adjustments/Labs and Tests Ordered: Current medicines are reviewed at length with the patient today.  Concerns regarding medicines are outlined above.  Medication changes, Labs and Tests ordered today are listed in the Patient Instructions below. There are no Patient Instructions on file for this visit.   Signed, Dorris Carnes, MD  02/26/2022 9:46 PM    [facility] [address], [place], [place]  [address] Phone: [phone]; Fax: [phone]

## 2022-02-28 ENCOUNTER — Encounter: Payer: Self-pay | Admitting: Internal Medicine

## 2022-02-28 ENCOUNTER — Ambulatory Visit: Payer: Medicare HMO | Admitting: Internal Medicine

## 2022-03-17 NOTE — Progress Notes (Signed)
Cardiology Office Note    Date:  03/18/2022   ID:  [name], Dylan [birth year], MRN [id number]  PCP:  Percell Belt, DO  Cardiologist:  Dr. Harrington Challenger  F/U of CAD, HTN  History of Present Illness:   Dylan [name] is a 72 y.o. male with past medical history of hypertension, hyperlipidemia, diabetes (diet-controlled), degenerative joint disease, GERD, sleep apnea on CPAP and mild CAD   Pt was admitted to [facility] in 2019 with chest pressure  Myovue was abnormal  He went on to have cath that showed minimal CAD       I saw the pt in June 2023 Since seen he deneis CP  Breathing is better   He had a very bad bronchitis about 1 week ago   Not RSV or COVID     Feeling better   Taking steroids    Past Medical History:  Diagnosis Date   Allergy    allergic rhinitis   Anxiety    Arthritis    R knee, hands , back oa   BPH (benign prostatic hyperplasia)    Cancer ([place])    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 resolved as of 01-27-2020   History of melanoma 15 yrs ago   face and forehead-- previously followed by Derm in [place]   History of migraine    none recent as of 01-27-2020   History of ulcerative colitis    non recent flares   Hyperlipidemia    Hypertension    Pseudocholinesterase deficiency    Skin cancer    Sleep apnea    last sleep study 10 yrs ago,CPAP-  no current cpap use     Past Surgical History:  Procedure Laterality Date   acl and mensicus repair reconstruct Right 1980's   ANTERIOR CERVICAL DECOMP/DISCECTOMY FUSION  10/15/2011   Procedure: ANTERIOR CERVICAL DECOMPRESSION/DISCECTOMY FUSION 1 LEVEL;  Surgeon: [name], MD;  Location: [facility];  Service: Neurosurgery;  Laterality: N/A;  Cervical four-five Anterior cervical  decompression/diskectomy/fusion   APPENDECTOMY  1969   COLONOSCOPY  last done 2018   ELBOW SURGERY Right 2019   tendon replacement   HERNIA REPAIR     inguinal / abdominal - repaired as an infant    JOINT REPLACEMENT     KNEE ARTHROSCOPY Left last done oct 2013   x 3   KNEE SURGERY  yrs ago   right knee   LEFT HEART CATH AND CORONARY ANGIOGRAPHY N/A 11/14/2017   Procedure: LEFT HEART CATH AND CORONARY ANGIOGRAPHY;  Surgeon: [name], MD;  Location: [place] [facility];  Service: Cardiovascular;  Laterality: N/A;   LUMBAR LAMINECTOMY WITH COFLEX 2 LEVEL N/A 09/12/2015   Procedure: L3-4 L4-5 Laminectomy with coflex;  Surgeon: [name], MD;  Location: [place] [facility];  Service: Neurosurgery;  Laterality: N/A;  L3-4 L4-5 Laminectomy with coflex   NASAL SINUS SURGERY     multiple sinus surgery - 1980's    SKIN CANCER EXCISION  2023   TONSILLECTOMY  1969   TOTAL KNEE ARTHROPLASTY Left 10/22/2012   Procedure: TOTAL KNEE ARTHROPLASTY;  Surgeon: [name], MD;  Location: [facility];  Service: Orthopedics;  Laterality: Left;   TOTAL KNEE REVISION WITH SCAR DEBRIDEMENT/PATELLA REVISION  WITH POLY EXCHANGE Left 05/18/2020   Procedure: Left knee excision saphenous nerve branch, neurectomy, left open scar excision with polyethlene revision;  Surgeon: [name], MD;  Location: [facility];  Service: Orthopedics;  Laterality: Left;  90 mins Spinal with adductor block   TRANSURETHRAL RESECTION OF PROSTATE N/A 01/31/2020   Procedure: TRANSURETHRAL RESECTION OF THE PROSTATE (TURP);  Surgeon: [name], MD;  Location: [facility];  Service: Urology;  Laterality: N/A;  76 MIS   UPPER GASTROINTESTINAL ENDOSCOPY      Current Medications: Prior to Admission medications   Medication Sig Start Date End Date Taking? Authorizing Provider  nortriptyline (PAMELOR) 10 MG capsule Take 10 mg by mouth at bedtime.   Yes [provider]  amLODipine (NORVASC) 5 MG tablet  Take 1 tablet (5 mg total) by mouth daily. 07/17/17   [name], MD  aspirin EC 81 MG tablet Take 1 tablet (81 mg total) by mouth daily. 10/24/12   [name], [name]  co-enzyme Q-10 30 MG capsule Take 30 mg by mouth daily.    [provider]  dutasteride (AVODART) 0.5 MG capsule TAKE 1 CAPSULE (0.5 MG TOTAL) BY MOUTH DAILY. 06/18/17   [name], MD  esomeprazole (NEXIUM) 40 MG capsule TAKE ONE CAPSULE BY MOUTH EVERY DAY BEFORE BREAKFAST 05/19/17   [name], MD  HYDROcodone-acetaminophen (NORCO) 10-325 MG tablet Take 1 tablet by mouth every 6 (six) hours as needed for severe pain. 12/04/17   [name], MD  lidocaine (LIDODERM) 5 % Place 1 patch onto the skin daily. May wear up to 12 hours 11/02/17   [provider]  Melatonin 5 MG TABS Take 1 tablet by mouth at bedtime.     [provider]  mupirocin ointment (BACTROBAN) 2 % Apply 1 application topically 2 (two) times daily. 10/10/17   [name], MD  naloxone [facility]) nasal spray 4 mg/0.1 mL Use as directed 07/22/17   [name], MD  nitroGLYCERIN (NITROSTAT) 0.4 MG SL tablet Place 1 tablet (0.4 mg total) under the tongue every 5 (five) minutes x 3 doses as needed for chest pain. 11/14/17   Bhagat, [name], PA  rosuvastatin (CRESTOR) 10 MG tablet TAKE 1 TABLET BY MOUTH EVERY DAY 05/11/17   [name], MD  tamsulosin (FLOMAX) 0.4 MG CAPS capsule Take 0.4 mg by mouth.    [provider]  valsartan (DIOVAN) 80 MG tablet TAKE 1 TABLET BY MOUTH EVERY DAY 12/02/17   [name], MD  zolpidem (AMBIEN) 10 MG tablet TAKE 1 TABLET BY MOUTH EVERY DAY AT BEDTIME AS NEEDED 12/11/17   [name], MD    Allergies:   Heparin, Succinylcholine chloride, Vancomycin, and Penicillins   Social History   Socioeconomic History   Marital status: Widowed    Spouse name: Not on file   Number of children: 3   Years of education: Not on file   Highest education level: Not  on file  Occupational History   Occupation: retired  Tobacco Use   Smoking status: Never   Smokeless tobacco: Former    Types: Chew    Quit date: 02/26/2003  Vaping Use   Vaping Use: Never used  Substance and Sexual Activity   Alcohol use: No    Comment: occasional beer   Drug use: No   Sexual activity: Not on file  Other Topics Concern   Not on file  Social History Narrative   Wife works at  cath lab at [employer]--has advanced breast cancer   Lives with wife in a 2 story home.  Retired.  Education: college.    Social Determinants of Health   Financial Resource Strain: Low Risk  (10/04/2019)   Overall Financial Resource Strain (CARDIA)    Difficulty of Paying Living Expenses: Not hard at all  Food Insecurity: No Food Insecurity (10/04/2019)   Hunger Vital Sign    Worried About Running Out of Food in the Last Year: Never true    Ran Out of Food in the Last Year: Never true  Transportation Needs: No Transportation Needs (10/04/2019)   PRAPARE - Hydrologist (Medical): No    Lack of Transportation (Non-Medical): No  Physical Activity: Sufficiently Active (10/04/2019)   Exercise Vital Sign    Days of Exercise per Week: 7 days    Minutes of Exercise per Session: 40 min  Stress: No Stress Concern Present (10/04/2019)   [place]    Feeling of Stress : Not at all  Social Connections: Moderately Integrated (10/04/2019)   Social Connection and Isolation Panel [NHANES]    Frequency of Communication with Friends and Family: More than three times a week    Frequency of Social Gatherings with Friends and Family: More than three times a week    Attends Religious Services: More than 4 times per year    Active Member of [employer] or Organizations: Yes    Attends Archivist Meetings: More than 4 times per year    Marital Status: Widowed     Family History:  The patient's family history includes Cancer in  his brother; Diabetes in his father; Ovarian cancer in his mother.  ROS:   Please see the history of present illness.    ROS All other systems reviewed and are negative.   PHYSICAL EXAM:   VS:  BP (!) 144/75   Pulse 100   Ht '5\' 11"'$  (1.803 m)   Wt 238 lb 9.6 oz (108.2 kg)   SpO2 96%   BMI 33.28 kg/m    GEN: Obese  72 yo  in no acute distress  HEENT: normal  Neck: JVP is normal    Cardiac: RRR; no murmurs, No LE  edema  Respiratory:  clear to auscultation GI: soft, nontender, nondistended, + BS Skin: warm and dry, no rash Neuro:  Alert and Oriented x 3,  Psych: euthymic mood, full affect  Wt Readings from Last 3 Encounters:  03/18/22 238 lb 9.6 oz (108.2 kg)  08/08/21 228 lb 6.4 oz (103.6 kg)  07/13/21 230 lb 6.4 oz (104.5 kg)      Studies/Labs Reviewed:   EKG:  EKG not done   Recent Labs: 08/08/2021: BUN 26; Creatinine, Ser 1.01; Hemoglobin 13.5; Platelets 314; Potassium 5.0; Sodium 136; TSH 1.340   Lipid Panel    Component Value Date/Time   CHOL 164 01/14/2020 0740   TRIG 157 (H) 01/14/2020 0740   HDL 61 01/14/2020 0740   CHOLHDL 2.7 01/14/2020 0740   CHOLHDL 3 10/19/2018 0845   VLDL 15.8 10/19/2018 0845   LDLCALC 76 01/14/2020 0740   LDLCALC 83 04/10/2018 1529   LDLDIRECT 142.0 11/11/2016 1626    Additional studies/ records that were reviewed today include:   Stress test 11/12/17 IMPRESSION: 1. Large area of reversibility involving the mid and distal anterior, septal, and inferoapical walls, suspicious for inducible myocardial ischemia.   2. Mild left ventricular dilatation, without regional  wall motion abnormality.   3. Left ventricular ejection fraction 59%   4. Non invasive risk stratification*: High  LEFT HEART CATH AND CORONARY ANGIOGRAPHY  10/2017  Conclusion     Prox LAD lesion is 10% stenosed. Nonobstructive CAD. The left ventricular systolic function is normal. LV end diastolic pressure is mildly elevated. LVEDP 18 mm Hg. The left  ventricular ejection fraction is 55-65% by visual estimate. There is no aortic valve stenosis.   Continue aggressive preventive therapy.        ASSESSMENT & PLAN:   1  HTN  BP is still elevated   I would increase carvedilol to 12.5 bid    Set to see pt back in  July     2 .  CAD   Denies CP   -Cath in 2019 with 10%pLAD. Continue ASA and statin.   3. OSA   Continue CPAP     4. HL  LDL 84 in Sept 2023  HDL 59  Keep on same meds   Will check CBC and BMET   Admits to not drinking enough  urine dark.   Tentative follow up this summer    Medication Adjustments/Labs and Tests Ordered: Current medicines are reviewed at length with the patient today.  Concerns regarding medicines are outlined above.  Medication changes, Labs and Tests ordered today are listed in the Patient Instructions below. There are no Patient Instructions on file for this visit.   Signed, Dorris Carnes, MD  03/18/2022 2:55 PM    [address], [place], [place]  [address] Phone: [phone]; Fax: [phone]

## 2022-03-18 ENCOUNTER — Ambulatory Visit: Payer: 59 | Attending: Internal Medicine | Admitting: Internal Medicine

## 2022-03-18 ENCOUNTER — Encounter: Payer: Self-pay | Admitting: Internal Medicine

## 2022-03-18 VITALS — BP 144/75 | HR 100 | Ht 71.0 in | Wt 238.6 lb

## 2022-03-18 DIAGNOSIS — Z79899 Other long term (current) drug therapy: Secondary | ICD-10-CM

## 2022-03-18 DIAGNOSIS — I10 Essential (primary) hypertension: Secondary | ICD-10-CM

## 2022-03-18 MED ORDER — CARVEDILOL 12.5 MG PO TABS: 12.5000 mg | ORAL_TABLET | Freq: Two times a day (BID) | ORAL | 3 refills | Status: DC

## 2022-03-18 NOTE — Patient Instructions (Signed)
Medication Instructions:  INCREASE CARVEDILOL TO 12.5 MG TWICE  DAY  *If you need a refill on your cardiac medications before your next appointment, please call your pharmacy*   Lab Work: BMET AND CBC  If you have labs (blood work) drawn today and your tests are completely normal, you will receive your results only by: [place] (if you have MyChart) OR A paper copy in the mail If you have any lab test that is abnormal or we need to change your treatment, we will call you to review the results.   Testing/Procedures:    Follow-Up: At [facility], you and your health needs are our priority.  As part of our continuing mission to provide you with exceptional heart care, we have created designated Provider Care Teams.  These Care Teams include your primary Cardiologist (physician) and Advanced Practice Providers (APPs -  Physician Assistants and Nurse Practitioners) who all work together to provide you with the care you need, when you need it.  We recommend signing up for the patient portal called "MyChart".  Sign up information is provided on this After Visit Summary.  MyChart is used to connect with patients for Virtual Visits (Telemedicine).  Patients are able to view lab/test results, encounter notes, upcoming appointments, etc.  Non-urgent messages can be sent to your provider as well.   To learn more about what you can do with MyChart, go to [email or web address].    Your next appointment:   6 month(s)  Provider:   Dorris Carnes, MD     Other Instructions  HYDRATE WELL

## 2022-03-19 ENCOUNTER — Encounter: Payer: Self-pay | Admitting: Internal Medicine

## 2022-03-19 ENCOUNTER — Other Ambulatory Visit: Payer: Self-pay

## 2022-03-19 DIAGNOSIS — E785 Hyperlipidemia, unspecified: Secondary | ICD-10-CM

## 2022-03-19 LAB — CBC
Hematocrit: 44.8 % (ref 37.5–51.0)
Hemoglobin: 15 g/dL (ref 13.0–17.7)
MCH: 29.1 pg (ref 26.6–33.0)
MCHC: 33.5 g/dL (ref 31.5–35.7)
MCV: 87 fL (ref 79–97)
Platelets: 368 10*3/uL (ref 150–450)
RBC: 5.16 x10E6/uL (ref 4.14–5.80)
RDW: 13.3 % (ref 11.6–15.4)
WBC: 11.1 10*3/uL — ABNORMAL HIGH (ref 3.4–10.8)

## 2022-03-19 LAB — BASIC METABOLIC PANEL
BUN/Creatinine Ratio: 19 (ref 10–24)
BUN: 18 mg/dL (ref 8–27)
CO2: 24 mmol/L (ref 20–29)
Calcium: 10.4 mg/dL — ABNORMAL HIGH (ref 8.6–10.2)
Chloride: 97 mmol/L (ref 96–106)
Creatinine, Ser: 0.95 mg/dL (ref 0.76–1.27)
Glucose: 127 mg/dL — ABNORMAL HIGH (ref 70–99)
Potassium: 4.1 mmol/L (ref 3.5–5.2)
Sodium: 138 mmol/L (ref 134–144)
eGFR: 86 mL/min/{1.73_m2} (ref >59)

## 2022-03-19 MED ORDER — ROSUVASTATIN CALCIUM 20 MG PO TABS: 20.0000 mg | ORAL_TABLET | Freq: Every day | ORAL | 3 refills | Status: DC

## 2022-05-03 ENCOUNTER — Other Ambulatory Visit: Payer: Self-pay | Admitting: Gastroenterology

## 2022-05-25 ENCOUNTER — Other Ambulatory Visit: Payer: Self-pay | Admitting: Internal Medicine

## 2022-05-31 ENCOUNTER — Ambulatory Visit: Payer: 59 | Admitting: Physician Assistant

## 2022-06-17 ENCOUNTER — Ambulatory Visit: Payer: 59 | Admitting: Physician Assistant

## 2022-07-17 ENCOUNTER — Other Ambulatory Visit: Payer: Self-pay | Admitting: Internal Medicine

## 2022-08-13 ENCOUNTER — Other Ambulatory Visit: Payer: Self-pay | Admitting: Internal Medicine

## 2022-08-13 DIAGNOSIS — I10 Essential (primary) hypertension: Secondary | ICD-10-CM

## 2022-08-14 ENCOUNTER — Ambulatory Visit (INDEPENDENT_AMBULATORY_CARE_PROVIDER_SITE_OTHER): Payer: 59 | Admitting: Gastroenterology

## 2022-08-14 ENCOUNTER — Other Ambulatory Visit (INDEPENDENT_AMBULATORY_CARE_PROVIDER_SITE_OTHER): Payer: 59

## 2022-08-14 ENCOUNTER — Encounter: Payer: Self-pay | Admitting: Gastroenterology

## 2022-08-14 VITALS — BP 136/80 | HR 98 | Ht 72.0 in

## 2022-08-14 DIAGNOSIS — R197 Diarrhea, unspecified: Secondary | ICD-10-CM

## 2022-08-14 LAB — TSH: TSH: 2.7 u[IU]/mL (ref 0.35–5.50)

## 2022-08-14 MED ORDER — DIPHENOXYLATE-ATROPINE 2.5-0.025 MG PO TABS: 1.0000 | ORAL_TABLET | Freq: Four times a day (QID) | ORAL | 1 refills | Status: DC | PRN

## 2022-08-14 NOTE — Progress Notes (Signed)
08/14/2022 Dylan [name] [id number] 11-18-1950   HISTORY OF PRESENT ILLNESS: This is a pleasant 72 year old male who was previously a patient of Dr. Christella Hartigan.  His care will be reassigned to Dr. Lavon Paganini in his absence.  He presents here today really just for yearly follow-up.  Had a gastric ulcer previously, but on last endoscopy last year that had healed.  He is off NSAIDs, taking gabapentin for pain now instead.  He is on Nexium 40 mg daily.  Says that more so recently he has been dealing with diarrhea for the last several months.  He says he usually has about 2 or 3 bowel movements a day, but they are always very loose.  He says that none of his medications are new except for the gabapentin but that has only been over the past month or so.  He denies any nocturnal stools.  He denies any abdominal pain or blood in his stool.  He says appetite is good.  Nothing major has changed in his diet.  He does not do a whole lot of dairy and does not use artificial sweeteners.  Taking two Imodium does not help much.  Colonoscopy 02/2021:  Diverticulosis, repeat in 10 years   Past Medical History:  Diagnosis Date   Allergy    allergic rhinitis   Anxiety    Arthritis    R knee, hands , back oa   BPH (benign prostatic hyperplasia)    Cancer ([facility])    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 resolved as of 01-27-2020   History of melanoma 15 yrs ago   face and forehead-- previously [place] by [place] in [place]   History of migraine    none recent as of 01-27-2020   History of ulcerative colitis    non recent flares   Hyperlipidemia    Hypertension    Pseudocholinesterase deficiency    Skin cancer    Sleep apnea    last sleep study 10 yrs ago,CPAP-  no current cpap use    Past  Surgical History:  Procedure Laterality Date   acl and mensicus repair reconstruct Right 1980's   ANTERIOR CERVICAL DECOMP/DISCECTOMY FUSION  10/15/2011   Procedure: ANTERIOR CERVICAL DECOMPRESSION/DISCECTOMY FUSION 1 LEVEL;  Surgeon: Barnett Abu, MD;  Location: [facility];  Service: Neurosurgery;  Laterality: N/A;  Cervical four-five Anterior cervical decompression/diskectomy/fusion   APPENDECTOMY  1969   COLONOSCOPY  last done 2018   ELBOW SURGERY Right 2019   tendon replacement   HERNIA REPAIR     inguinal / abdominal - repaired as an infant    JOINT REPLACEMENT     KNEE ARTHROSCOPY Left last done oct 2013   x 3   KNEE SURGERY  yrs ago   right knee   LEFT HEART CATH AND CORONARY ANGIOGRAPHY N/A 11/14/2017   Procedure: LEFT HEART CATH AND CORONARY ANGIOGRAPHY;  Surgeon: Corky Crafts, MD;  [facility];  Service: Cardiovascular;  Laterality: N/A;   LUMBAR LAMINECTOMY WITH COFLEX 2 LEVEL N/A 09/12/2015   Procedure: L3-4 L4-5 Laminectomy with coflex;  Surgeon: Barnett Abu, MD;  Location: [facility];  Service: Neurosurgery;  Laterality: N/A;  L3-4 L4-5 Laminectomy with coflex   NASAL SINUS SURGERY  multiple sinus surgery - 1980's    SKIN CANCER EXCISION  2023   TONSILLECTOMY  1969   TOTAL KNEE ARTHROPLASTY Left 10/22/2012   Procedure: TOTAL KNEE ARTHROPLASTY;  Surgeon: Javier Docker, MD;  [facility]: [facility];  Service: Orthopedics;  Laterality: Left;   TOTAL KNEE REVISION WITH SCAR DEBRIDEMENT/PATELLA REVISION WITH POLY EXCHANGE Left 05/18/2020   Procedure: Left knee excision saphenous nerve branch, neurectomy, left open scar excision with polyethlene revision;  Surgeon: Durene Romans, MD;  Location: [facility];  Service: Orthopedics;  Laterality: Left;  90 mins Spinal with adductor block   TRANSURETHRAL RESECTION OF PROSTATE N/A 01/31/2020   Procedure: TRANSURETHRAL RESECTION OF THE PROSTATE (TURP);  Surgeon: Marcine Matar, MD;  Location: [facility];  Service: Urology;  Laterality: N/A;  75 MIS   UPPER GASTROINTESTINAL ENDOSCOPY      reports that he has never smoked. He quit smokeless tobacco use about 19 years ago.  His smokeless tobacco use included chew. He reports that he does not drink alcohol and does not use drugs. family history includes Cancer in his brother; Diabetes in his father; Ovarian cancer in his mother. Allergies  Allergen Reactions   Heparin Anaphylaxis    breathing problems   Succinylcholine Chloride Other (See Comments)    does not wake up from anesthetic, pt. Reports that through testing that he was found  not to have the enzyme to breakdown the Succ.   Vancomycin Hives and Other (See Comments)    Ran off a pump. So developed redman's . Running too fast   Penicillins Rash    Did it involve swelling of the face/tongue/throat, SOB, or low BP? No Did it involve sudden or severe rash/hives, skin peeling, or any reaction on the inside of your mouth or nose? No Did you need to seek medical attention at a hospital or doctor's office? No When did it last happen?     childhood  If all above answers are "NO", may proceed with cephalosporin use.  Tolerated Cephalosporin Date: 05/18/20.        Outpatient Encounter Medications as of 08/14/2022  Medication Sig   acetaminophen (TYLENOL) 650 MG CR tablet Take 1,300 mg by mouth every 8 (eight) hours as needed for pain.   amLODipine (NORVASC) 10 MG tablet TAKE 1 TABLET BY MOUTH EVERY DAY   azithromycin (ZITHROMAX) 250 MG tablet Take 250 mg by mouth daily.   Biotin w/ Vitamins C & E (HAIR SKIN & NAILS GUMMIES PO) Take 2 tablets by mouth daily.   carvedilol (COREG) 12.5 MG tablet Take 1 tablet (12.5 mg total) by mouth 2 (two) times daily.   cetirizine (ZYRTEC) 10 MG tablet Take 10 mg by mouth daily as needed for allergies.   Coenzyme Q10 (COQ10) 200 MG CAPS Take 200 mg by mouth daily.   cyclobenzaprine (FLEXERIL) 10 MG tablet Take 10 mg by mouth 3 (three) times  daily as needed.   escitalopram (LEXAPRO) 20 MG tablet Take 10 mg by mouth daily.   esomeprazole (NEXIUM) 40 MG capsule TAKE 1 CAPSULE (40 MG TOTAL) BY MOUTH DAILY.   fluticasone (FLONASE) 50 MCG/ACT nasal spray Place 2 sprays into both nostrils daily.   gabapentin (NEURONTIN) 300 MG capsule TAKE 1 CAPSULE AT BEDTIME FOR 3 DAYS, THEN 1 CAPSULE TWICE A DAY FOR 3 DAYS, THEN 1 CAPSULE 3 TIMES A DAY IF SLEEPINESS, SIDE EFFECTS DURING THE DAY, OKAY TO TAKE 2 CAPSULES AT NIGHT   hydrochlorothiazide (HYDRODIURIL) 25 MG tablet TAKE  1 TABLET (25 MG TOTAL) BY MOUTH DAILY.   irbesartan (AVAPRO) 150 MG tablet TAKE 1 AND 1/2 TABLETS DAILY BY MOUTH   meclizine (ANTIVERT) 25 MG tablet Take 1 tablet by mouth 3 (three) times daily as needed for dizziness or nausea.   Melatonin 5 MG TABS Take 10 mg by mouth at bedtime as needed (sleep).   mupirocin ointment (BACTROBAN) 2 % Apply 1 application topically daily as needed (skin bumps).   oxyCODONE-acetaminophen (PERCOCET) 10-325 MG tablet Take 1 tablet by mouth 4 (four) times daily.   predniSONE (DELTASONE) 10 MG tablet Take 10 mg by mouth 4 (four) times daily.   rosuvastatin (CRESTOR) 20 MG tablet Take 1 tablet (20 mg total) by mouth daily.   vitamin B-12 (CYANOCOBALAMIN) 500 MCG tablet Take 1,000 mcg by mouth daily.   vitamin C (ASCORBIC ACID) 500 MG tablet Take 1,000 mg by mouth daily.   zolpidem (AMBIEN) 10 MG tablet TAKE 1 TABLET BY MOUTH EVERYDAY AT BEDTIME   celecoxib (CELEBREX) 200 MG capsule Take 1 capsule (200 mg total) by mouth 2 (two) times daily. (Patient not taking: Reported on 08/14/2022)   No facility-administered encounter medications on file as of 08/14/2022.    REVIEW OF SYSTEMS  : All other systems reviewed and negative except where noted in the History of Present Illness.   PHYSICAL EXAM: BP 136/80   Pulse 98   Ht 6' (1.829 m)   BMI 32.36 kg/m  General: Well developed white male in no acute distress Head: Normocephalic and  atraumatic Eyes:  Sclerae anicteric, conjunctiva pink. Ears: Normal auditory acuity Lungs: Clear throughout to auscultation; no W/R/R. Heart: Regular rate and rhythm; no M/R/G. Abdomen: Soft, non-distended.  BS present.  Non-tender. Musculoskeletal: Symmetrical with no gross deformities  Skin: No lesions on visible extremities Extremities: No edema  Neurological: Alert oriented x 4, grossly non-focal Psychological:  Alert and cooperative. Normal mood and affect  ASSESSMENT AND PLAN: *Diarrhea/loose stool:  Has been present for the past handful of months.  Has about 2 or 3 bowel movements daily.  No rectal bleeding, abdominal pain, nocturnal stools.  Appetite is good.  Does not sound infectious.  Had a colonoscopy last year that was normal.  No new meds except for gabapentin which only started about a month ago.  No significant changes in his diet, nothing specific he can identify.  Will check a TSH and celiac labs.  Imodium with very little effect.  Will try Lomotil as needed.  Prescription sent to pharmacy.  We also discussed trying Questran.  If persistent could do some stool studies to check a fecal calprotectin and fecal elastase.   CC:  Lazoff, Shawn P, DO

## 2022-08-14 NOTE — Patient Instructions (Addendum)
Your provider has requested that you go to the basement level for lab work before leaving today. Press "B" on the elevator. The lab is located at the first door on the left as you exit the elevator.   We have sent the following medications to your pharmacy for you to pick up at your convenience: Lomotil 1 tablet every 6-8 hours as needed for diarrhea.   _______________________________________________________  If your blood pressure at your visit was 140/90 or greater, please contact your primary care physician to follow up on this.  _______________________________________________________  If you are age 71 or older, your body mass index should be between 23-30. Your Body mass index is 32.36 kg/m. If this is out of the aforementioned range listed, please consider follow up with your Primary Care Provider.  If you are age 62 or younger, your body mass index should be between 19-25. Your Body mass index is 32.36 kg/m. If this is out of the aformentioned range listed, please consider follow up with your Primary Care Provider.   ________________________________________________________  The [facility] providers would like to encourage you to use [facility] to communicate with providers for non-urgent requests or questions.  Due to long hold times on the telephone, sending your provider a message by [facility] may be a faster and more efficient way to get a response.  Please allow 48 business hours for a response.  Please remember that this is for non-urgent requests.  _______________________________________________________

## 2022-08-17 LAB — IGA: Immunoglobulin A: 150 mg/dL (ref 70–320)

## 2022-08-17 LAB — TISSUE TRANSGLUTAMINASE ABS,IGG,IGA
(tTG) Ab, IgA: <1 U/mL
(tTG) Ab, IgG: <1 U/mL

## 2022-09-02 ENCOUNTER — Encounter: Payer: Self-pay | Admitting: Internal Medicine

## 2022-09-03 NOTE — Progress Notes (Signed)
Reviewed and agree with documentation and assessment and plan. K. Veena Sibbie Flammia , MD   

## 2022-09-26 ENCOUNTER — Telehealth: Payer: Self-pay

## 2022-09-26 NOTE — Telephone Encounter (Signed)
-----   Message from Nurse Sueanne Maniaci P sent at 09/25/2022  8:31 AM EDT -----  ----- Message ----- From: Leta Baptist, [name] Sent: 09/24/2022   5:23 PM EDT To: Loretha Stapler, RN  Can you please check on his diarrhea status.  I know we sent his results via MyChart so I just want to be sure he communicates with [place] for any ongoing symptoms.  I know we had suggested some stool studies if he had ongoing diarrhea.  I do not want him to go without other evaluation if he is continuing to have issues.  Thank you,  Jess

## 2022-09-26 NOTE — Telephone Encounter (Signed)
Left message on machine to call back  

## 2022-09-27 NOTE — Telephone Encounter (Signed)
Shanda Bumps I have tried to call the pt on several occasions and left messages for him to return call to give [place] an update with no response.  I did resend a note to My chart as well and he did see the message.  Hopefully he will call back if he has any continued diarrhea.

## 2022-10-07 NOTE — Telephone Encounter (Signed)
Shanda Bumps see the message from pt. You had requested an update.

## 2022-10-13 NOTE — Progress Notes (Unsigned)
Cardiology Office Note:  .   Date:  10/14/2022  ID:  Dylan [name], DOB 1950-11-11, MRN [id number] PCP: Dylan Marlin, DO  [facility] Providers Cardiologist:  Dylan Pates, MD    Patient Profile: .      PMH: Primary hypertension Hyperlipidemia Type 2 DM GERD Sleep apnea on CPAP CAD Cardiac catheterization 10/2017 Prox LAD 10%   He was admitted to [facility] in 2019 with chest pressure.  Myoview was normal.  He went on to have cardiac catheterization that showed minimal CAD.  He has maintained consistent follow-up with Dr. Tenny Craw.  Last cardiology clinic visit was 03/18/2022 with Dr. Tenny Craw. BP was elevated. His carvedilol was increased to 12.5 mg twice daily. He was encouraged to hydrate well as he reported dark urine. Advised to return in 6 months for follow-up.       History of Present Illness: Dylan Kitchen   KHAMARION [name] is a very pleasant 72 y.o. male who is here today for 6 month follow-up. Reports he has been doing well. Home SBP running in the 130s, seems to be 136 quite often. He has not been taking carvedilol twice daily, thought it was recommended only to take 2nd dose if BP was high.  Is now retired. Plays golf, does yard work, walks dogs 30 minutes or more, will increase walking when it gets cooler. He denies chest pain, shortness of breath, lower extremity edema, fatigue, palpitations, melena, hematuria, hemoptysis, diaphoresis, weakness, presyncope, syncope, orthopnea, and PND.  ROS: See HPI       Studies Reviewed: Dylan Kitchen   EKG Interpretation Date/Time:  Monday October 14 2022 13:24:33 EDT Ventricular Rate:  85 PR Interval:  238 QRS Duration:  98 QT Interval:  382 QTC Calculation: 454 R Axis:   -87  Text Interpretation: Sinus rhythm with 1st degree A-V block Left axis deviation Pulmonary disease pattern Incomplete right bundle branch block When compared with ECG of 12-May-2020 08:23, No significant change was found Confirmed by Dylan [name] [phone]) on 10/14/2022  2:05:07 PM     Risk Assessment/Calculations:             Physical Exam:   VS:  BP 130/68   Pulse 85   Ht 6' (1.829 m)   Wt 247 lb (112 kg)   SpO2 98%   BMI 33.50 kg/m    Wt Readings from Last 3 Encounters:  10/14/22 247 lb (112 kg)  03/18/22 238 lb 9.6 oz (108.2 kg)  08/08/21 228 lb 6.4 oz (103.6 kg)    GEN: Well nourished, well developed in no acute distress NECK: No JVD; No carotid bruits CARDIAC: RRR, no murmurs, rubs, gallops RESPIRATORY:  Clear to auscultation without rales, wheezing or rhonchi  ABDOMEN: Soft, non-tender, non-distended EXTREMITIES:  No edema; No deformity     ASSESSMENT AND PLAN: .    Hypertension: BP remains slightly above goal. Unfortunately, he has only been taking his carvedilol once daily.  He will take carvedilol 12.5 mg twice daily and monitor BP. I have asked him to send readings in 2 weeks for BP goal 120/80 or below. We will get BMP to ensure stable renal function and electrolytes. Encouraged low sodium diet.   CAD without angina: Mild nonobstructive CAD on cath 2019. He denies chest pain, dyspnea, or other symptoms concerning for angina.  No indication for further ischemic evaluation at this time. Emphasized the importance of secondary prevention and including well-controlled BP, cholesterol, 150 minutes or more of moderate intensity exercise each  week, and heart healthy diet limiting processed foods, saturated fat, sugar, and simple carbohydrates.  Hyperlipidemia LDL goal < 70: LDL 84 on 08/08/21.  We will recheck tomorrow when he can return fasting. Lengthy discussion about the importance of LDL goal < 70. Continue rosuvastatin.       Dispo: 6 months with Dr. Tenny Craw or me  Signed, Dylan Bridegroom, NP-C

## 2022-10-14 ENCOUNTER — Encounter: Payer: Self-pay | Admitting: Nurse Practitioner

## 2022-10-14 ENCOUNTER — Ambulatory Visit: Payer: Medicare HMO | Attending: Nurse Practitioner | Admitting: Nurse Practitioner

## 2022-10-14 VITALS — BP 130/68 | HR 85 | Ht 72.0 in | Wt 247.0 lb

## 2022-10-14 DIAGNOSIS — E785 Hyperlipidemia, unspecified: Secondary | ICD-10-CM

## 2022-10-14 DIAGNOSIS — I10 Essential (primary) hypertension: Secondary | ICD-10-CM | POA: Diagnosis not present

## 2022-10-14 DIAGNOSIS — I2510 Atherosclerotic heart disease of native coronary artery without angina pectoris: Secondary | ICD-10-CM

## 2022-10-14 NOTE — Patient Instructions (Signed)
Medication Instructions:   Your physician recommends that you continue on your current medications as directed. Please refer to the Current Medication list given to you today.   Please make sure you are taking your Coreg one (1) tablet by mouth ( 12.5 ) twice daily.   *If you need a refill on your cardiac medications before your next appointment, please call your pharmacy*   Lab Work:  Your physician recommends that you return for a FASTING lipid profile/cmet on Tuesday, August 19. You can come in on the day of your appointment anytime between 7:30-4:30 fasting from midnight the night before.     If you have labs (blood work) drawn today and your tests are completely normal, you will receive your results only by: MyChart Message (if you have MyChart) OR A paper copy in the mail If you have any lab test that is abnormal or we need to change your treatment, we will call you to review the results.   Testing/Procedures:  None ordered.   Follow-Up: At [facility], you and your health needs are our priority.  As part of our continuing mission to provide you with exceptional heart care, we have created designated Provider Care Teams.  These Care Teams include your primary Cardiologist (physician) and Advanced Practice Providers (APPs -  Physician Assistants and Nurse Practitioners) who all work together to provide you with the care you need, when you need it.  We recommend signing up for the patient portal called "MyChart".  Sign up information is provided on this After Visit Summary.  MyChart is used to connect with patients for Virtual Visits (Telemedicine).  Patients are able to view lab/test results, encounter notes, upcoming appointments, etc.  Non-urgent messages can be sent to your provider as well.   To learn more about what you can do with MyChart, go to [email or web address].    Your next appointment:   6 month(s)  Provider:   Dietrich Pates, MD or Eligha Bridegroom,  NP.     Other Instructions  HOW TO TAKE YOUR BLOOD PRESSURE  Rest 5 minutes before taking your blood pressure. Don't  smoke or drink caffeinated beverages for at least 30 minutes before. Take your blood pressure before (not after) you eat. Sit comfortably with your back supported and both feet on the floor ( don't cross your legs). Elevate your arm to heart level on a table or a desk. Use the proper sized cuff.  It should fit smoothly and snugly around your bare upper arm.  There should be  Enough room to slip a fingertip under the cuff.  The bottom edge of the cuff should be 1 inch above the crease Of the elbow. Please monitor your blood pressure once daily 2 hours after your am medication. If you blood pressure Consistently remains above 120 (systolic) top number or over 80 ( diastolic) bottom number X 3 days  Consecutively.  Please call our office at [phone] or send Mychart message.     ----Avoid cold medicines with D or DM at the end of them----

## 2022-10-15 ENCOUNTER — Ambulatory Visit: Payer: Medicare HMO | Attending: Family Medicine

## 2022-11-21 ENCOUNTER — Other Ambulatory Visit: Payer: Self-pay | Admitting: Internal Medicine

## 2022-11-22 ENCOUNTER — Other Ambulatory Visit (HOSPITAL_COMMUNITY): Payer: Self-pay

## 2022-11-22 ENCOUNTER — Telehealth: Payer: Self-pay

## 2022-11-22 ENCOUNTER — Other Ambulatory Visit: Payer: Self-pay | Admitting: Internal Medicine

## 2022-11-22 NOTE — Telephone Encounter (Signed)
PA request has been Submitted. New Encounter created for follow up. For additional info see Pharmacy Prior Auth telephone encounter from 11/22/22.

## 2022-11-22 NOTE — Telephone Encounter (Signed)
Pharmacy is requesting prior auth

## 2022-11-22 NOTE — Telephone Encounter (Signed)
Pharmacy Patient Advocate Encounter   Received notification from Physician's Office that prior authorization for IRBESARTAN is required/requested.   Insurance verification completed.   The patient is insured through [employer] .   Per test claim: PA required; PA submitted to [facility] via CoverMyMeds Key/confirmation #/EOC [id number] Status is pending

## 2022-11-24 ENCOUNTER — Encounter: Payer: Self-pay | Admitting: Internal Medicine

## 2022-11-25 ENCOUNTER — Other Ambulatory Visit (HOSPITAL_COMMUNITY): Payer: Self-pay

## 2022-11-25 MED ORDER — IRBESARTAN 150 MG PO TABS: 150.0000 mg | ORAL_TABLET | Freq: Every day | ORAL | 3 refills | Status: DC

## 2022-11-25 NOTE — Telephone Encounter (Signed)
Pharmacy Patient Advocate Encounter  Received notification from [facility] that Prior Authorization for IRBESARTAN has been APPROVED from 02/25/22 to 02/25/23. Ran test claim, Copay is $0. This test claim was processed through [facility]- copay amounts may vary at other pharmacies due to pharmacy/plan contracts, or as the patient moves through the different stages of their insurance plan.

## 2022-11-25 NOTE — Telephone Encounter (Signed)
MyChart sent to the pt.

## 2022-12-05 ENCOUNTER — Other Ambulatory Visit: Payer: Self-pay | Admitting: Internal Medicine

## 2022-12-05 DIAGNOSIS — I10 Essential (primary) hypertension: Secondary | ICD-10-CM

## 2022-12-10 ENCOUNTER — Other Ambulatory Visit: Payer: Self-pay | Admitting: Internal Medicine

## 2022-12-19 ENCOUNTER — Other Ambulatory Visit: Payer: Self-pay | Admitting: Gastroenterology

## 2023-02-25 ENCOUNTER — Other Ambulatory Visit: Payer: Self-pay | Admitting: Internal Medicine

## 2023-02-25 DIAGNOSIS — I10 Essential (primary) hypertension: Secondary | ICD-10-CM

## 2023-03-29 ENCOUNTER — Telehealth: Payer: Medicare HMO | Admitting: Family Medicine

## 2023-03-29 DIAGNOSIS — J019 Acute sinusitis, unspecified: Secondary | ICD-10-CM | POA: Diagnosis not present

## 2023-03-29 MED ORDER — DOXYCYCLINE HYCLATE 100 MG PO TABS
100.0000 mg | ORAL_TABLET | Freq: Two times a day (BID) | ORAL | 0 refills | Status: AC
Start: 2023-03-29 — End: 2023-04-08

## 2023-03-29 NOTE — Progress Notes (Signed)

## 2023-05-21 ENCOUNTER — Telehealth: Admitting: Physician Assistant

## 2023-05-21 DIAGNOSIS — H6991 Unspecified Eustachian tube disorder, right ear: Secondary | ICD-10-CM

## 2023-05-21 MED ORDER — FLUTICASONE PROPIONATE 50 MCG/ACT NA SUSP: 2.0000 | Freq: Every day | NASAL | 0 refills | Status: AC

## 2023-05-21 NOTE — Progress Notes (Signed)

## 2023-06-08 ENCOUNTER — Other Ambulatory Visit: Payer: Self-pay | Admitting: Internal Medicine

## 2023-06-08 DIAGNOSIS — E785 Hyperlipidemia, unspecified: Secondary | ICD-10-CM

## 2023-06-30 ENCOUNTER — Ambulatory Visit: Admitting: Podiatry

## 2023-07-02 ENCOUNTER — Other Ambulatory Visit: Payer: Self-pay | Admitting: Internal Medicine

## 2023-07-16 ENCOUNTER — Ambulatory Visit: Admitting: Podiatry

## 2023-07-16 ENCOUNTER — Encounter: Payer: Self-pay | Admitting: Podiatry

## 2023-07-16 ENCOUNTER — Ambulatory Visit (INDEPENDENT_AMBULATORY_CARE_PROVIDER_SITE_OTHER)

## 2023-07-16 VITALS — Ht 72.0 in | Wt 247.0 lb

## 2023-07-16 DIAGNOSIS — M7750 Other enthesopathy of unspecified foot: Secondary | ICD-10-CM

## 2023-07-16 DIAGNOSIS — M7751 Other enthesopathy of right foot: Secondary | ICD-10-CM

## 2023-07-16 DIAGNOSIS — L600 Ingrowing nail: Secondary | ICD-10-CM

## 2023-07-16 NOTE — Patient Instructions (Signed)

## 2023-07-17 NOTE — Progress Notes (Signed)
 Subjective:   Patient ID: Dylan [name], male   DOB: 73 y.o.   MRN: [id number]   HPI Patient presents with caregiver with a chronic left big toenail damage that been painful and discoloration of the right big toenail for just recent duration.  States that he had a history of spurs underneath the big toes and is wondering if that is the reason he is getting so much pain.  Patient does not smoke likes to be active   Review of Systems  All other systems reviewed and are negative.       Objective:  Physical Exam Vitals and nursing note reviewed.  Constitutional:      Appearance: He is well-developed.  Pulmonary:     Effort: Pulmonary effort is normal.  Musculoskeletal:        General: Normal range of motion.  Skin:    General: Skin is warm.  Neurological:     Mental Status: He is alert.     Neurovascular status intact muscle strength found to be adequate range of motion adequate with patient noted to have a very thickened left hallux nail partially detached painful crust dorsally and on the right there is definite trauma to the nailbed and he also gets some pain.  Does have history of spurs that he would like to have checked again chronic     Assessment:  Imaged hallux nail left on the right with the possibility of bone spur involvement with the pain the patient is experiencing     Plan:  Patient.  X-rays reviewed that today have recommended nail removal left and letting the right one grow and it may need to be removed in the future.  I did explain procedure risk patient wants surgery and signed consent form.  I infiltrated the left big toe 60 mg Xylocaine  Marcaine  mixture sterile prep done using sterile instrumentation I removed the hallux nail exposed matrix I applied phenol for applications 30 seconds followed by alcohol lavage sterile dressing gave instructions on soaks wear dressing 24 hours take it off earlier if throbbing were to occur  X-rays indicate there is bone spurs on  the big toe both feet dorsally which may or may not be contributory to pain and may eventually need to be resected

## 2023-08-01 ENCOUNTER — Telehealth: Payer: Self-pay | Admitting: Internal Medicine

## 2023-08-01 NOTE — Telephone Encounter (Signed)
 Pt called to report that he has been having bilateral lower extremity edema for a week... he has not had any SOB... he saw his PCP earlier in the week.... Dr. Lazoff noted the pt was off of HCTZ so he added Spironolactone... he did labs and will plan to check labs again next week. Pt says he says he has not started the Spironolactone until he talked to us  but I urged ho to go ahead and start it to see if this gives him some relief.   I made him an appt with Dr Avanell Bob for 08/12/23.

## 2023-08-01 NOTE — Telephone Encounter (Signed)
 Pt c/o swelling: STAT is pt has developed SOB within 24 hours  How much weight have you gained and in what time span?  Patient is unsure/unable to gauge because he recently lost about 70 lbs.  If swelling, where is the swelling located?  Feet + legs, mainly the right leg below the knee  Are you currently taking a fluid pill?  He mentions he was on 2 and taken off 1, but he doesn't know them names of the medications.  Are you currently SOB?  No   Do you have a log of your daily weights (if so, list)?  No  Have you gained 3 pounds in a day or 5 pounds in a week?   Have you traveled recently?  No

## 2023-08-11 NOTE — Progress Notes (Unsigned)
 Cardiology Office Note    Date:  08/12/2023   ID:  Dylan [name], DOB [birth year], MRN [id number]  PCP:  Lazoff, Shawn P, DO  Cardiologist:  Dr. Avanell Bob  F/U of CAD, HTN  History of Present Illness:   Dylan [name] is a 73 y.o. male with past medical history of hypertension, hyperlipidemia, diabetes (diet-controlled), degenerative joint disease, GERD, sleep apnea on CPAP and mild CAD   Pt was   2019   PT had chest pressure   Admitted to [facility]   Myoview  abbnormal   LHC showed minimal CAD   I saw the pt in Jan 2024   He was seen by Atlas Lea in Aug 2024    The pt called in on 08/01/22 complaining of 1 week of LE edema to knees   No SOB   Seen by PCP   Pt was no longer on hydrochlorothiazide   Seen by PCP who added spironolactone  Since then the pt says the edema has resolved  The pt notes some dizziness.    He deneis CP    No palpitations. 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 resolved as of 01-27-2020   History of melanoma 15 yrs ago   face and forehead-- previously followed by Ava Boatman in [place]    History of migraine    none recent as of 01-27-2020   History of ulcerative colitis    non recent flares   Hyperlipidemia    Hypertension    Pseudocholinesterase deficiency    Skin cancer    Sleep apnea    last sleep study 10 yrs ago,CPAP-  no current cpap use     Past Surgical History:  Procedure Laterality Date   acl and mensicus repair reconstruct Right 1980's   ANTERIOR CERVICAL DECOMP/DISCECTOMY FUSION   10/15/2011   Procedure: ANTERIOR CERVICAL DECOMPRESSION/DISCECTOMY FUSION 1 LEVEL;  Surgeon: Elna Haggis, MD;  Location: [facility];  Service: Neurosurgery;  Laterality: N/A;  Cervical four-five Anterior cervical decompression/diskectomy/fusion   APPENDECTOMY  1969   COLONOSCOPY  last done 2018   ELBOW SURGERY Right 2019   tendon replacement   HERNIA REPAIR     inguinal / abdominal - repaired as an infant    JOINT REPLACEMENT     KNEE ARTHROSCOPY Left last done oct 2013   x 3   KNEE SURGERY  yrs ago   right knee   LEFT HEART CATH AND CORONARY ANGIOGRAPHY N/A 11/14/2017   Procedure: LEFT HEART CATH AND CORONARY ANGIOGRAPHY;  Surgeon: Lucendia Rusk, MD;  Location: [facility] INVASIVE CV [facility];  Service: Cardiovascular;  Laterality: N/A;   LUMBAR LAMINECTOMY WITH COFLEX 2 LEVEL N/A 09/12/2015   Procedure: L3-4 L4-5 Laminectomy with coflex;  Surgeon: Elna Haggis, MD;  Location: [facility];  Service: Neurosurgery;  Laterality: N/A;  L3-4 L4-5 Laminectomy with coflex   NASAL SINUS SURGERY  multiple sinus surgery - 1980's    SKIN CANCER EXCISION  2023   TONSILLECTOMY  1969   TOTAL KNEE ARTHROPLASTY Left 10/22/2012   Procedure: TOTAL KNEE ARTHROPLASTY;  Surgeon: Loel Ring, MD;  Location: [facility];  Service: Orthopedics;  Laterality: Left;   TOTAL KNEE REVISION WITH SCAR DEBRIDEMENT/PATELLA REVISION WITH POLY EXCHANGE Left 05/18/2020   Procedure: Left knee excision saphenous nerve branch, neurectomy, left open scar excision with polyethlene revision;  Surgeon: Claiborne Crew, MD;  Location: [facility];  Service: Orthopedics;  Laterality: Left;  90 mins Spinal with adductor block   TRANSURETHRAL RESECTION OF PROSTATE N/A 01/31/2020   Procedure: TRANSURETHRAL RESECTION OF THE PROSTATE (TURP);  Surgeon: Trent Frizzle, MD;  Location: [facility];  Service: Urology;  Laterality: N/A;  76 MIS   UPPER GASTROINTESTINAL ENDOSCOPY      Current Medications: Prior to Admission  medications   Medication Sig Start Date End Date Taking? Authorizing Provider  nortriptyline  (PAMELOR ) 10 MG capsule Take 10 mg by mouth at bedtime.   Yes [provider]  amLODipine  (NORVASC ) 5 MG tablet Take 1 tablet (5 mg total) by mouth daily. 07/17/17   Arcadio Knuckles, MD  aspirin  EC 81 MG tablet Take 1 tablet (81 mg total) by mouth daily. 10/24/12   Equilla Hastings, [name]  co-enzyme Q-10 30 MG capsule Take 30 mg by mouth daily.    [provider]  dutasteride  (AVODART ) 0.5 MG capsule TAKE 1 CAPSULE (0.5 MG TOTAL) BY MOUTH DAILY. 06/18/17   Arcadio Knuckles, MD  esomeprazole  (NEXIUM ) 40 MG capsule TAKE ONE CAPSULE BY MOUTH EVERY DAY BEFORE BREAKFAST 05/19/17   Arcadio Knuckles, MD  HYDROcodone -acetaminophen  (NORCO) 10-325 MG tablet Take 1 tablet by mouth every 6 (six) hours as needed for severe pain. 12/04/17   Tabori, Katherine E, MD  lidocaine  (LIDODERM ) 5 % Place 1 patch onto the skin daily. May wear up to 12 hours 11/02/17   [provider]  Melatonin 5 MG TABS Take 1 tablet by mouth at bedtime.     [provider]  mupirocin  ointment (BACTROBAN ) 2 % Apply 1 application topically 2 (two) times daily. 10/10/17   Tabori, Katherine E, MD  naloxone  (NARCAN ) nasal spray 4 mg/0.1 mL Use as directed 07/22/17   Arcadio Knuckles, MD  nitroGLYCERIN  (NITROSTAT ) 0.4 MG SL tablet Place 1 tablet (0.4 mg total) under the tongue every 5 (five) minutes x 3 doses as needed for chest pain. 11/14/17   Bhagat, Annia Kilts, PA  rosuvastatin  (CRESTOR ) 10 MG tablet TAKE 1 TABLET BY MOUTH EVERY DAY 05/11/17   Arcadio Knuckles, MD  tamsulosin  (FLOMAX ) 0.4 MG CAPS capsule Take 0.4 mg by mouth.    [provider]  valsartan  (DIOVAN ) 80 MG tablet TAKE 1 TABLET BY MOUTH EVERY DAY 12/02/17   Tabori, Katherine E, MD  zolpidem  (AMBIEN ) 10 MG tablet TAKE 1 TABLET BY MOUTH EVERY DAY AT BEDTIME AS NEEDED 12/11/17   Tabori, Katherine E, MD    Allergies:   Heparin , Succinylcholine chloride,  Vancomycin , and Penicillins   Social History   Socioeconomic History   Marital status: Widowed    Spouse name: Not on file   Number of children: 3   Years of education: Not on file   Highest education level: Not on file  Occupational History   Occupation: retired  Tobacco Use   Smoking status: Never   Smokeless tobacco: Former    Types: Chew    Quit date:  02/26/2003  Vaping Use   Vaping status: Never Used  Substance and Sexual Activity   Alcohol use: No    Comment: occasional beer   Drug use: No   Sexual activity: Not on file  Other Topics Concern   Not on file  Social History Narrative   Wife works at cath lab at [employer] advanced breast cancer   Lives with wife in a 2 story home.  Retired.  Education: college.    Social Drivers of Corporate investment banker Strain: Low Risk  (10/04/2019)   Overall Financial Resource Strain (CARDIA)    Difficulty of Paying Living Expenses: Not hard at all  Food Insecurity: Low Risk  (05/27/2023)   Received from [facility]   Hunger Vital Sign    Within the past 12 months, you worried that your food would run out before you got money to buy more: Never true    Within the past 12 months, the food you bought just didn't last and you didn't have money to get more. : Never true  Transportation Needs: No Transportation Needs (05/27/2023)   Received from [facility]    In the past 12 months, has lack of reliable transportation kept you from medical appointments, meetings, work or from getting things needed for daily living? : No  Physical Activity: Sufficiently Active (10/04/2019)   Exercise Vital Sign    Days of Exercise per Week: 7 days    Minutes of Exercise per Session: 40 min  Stress: No Stress Concern Present (04/12/2023)   Received from [facility] of Occupational Health - Occupational Stress Questionnaire    Feeling of Stress : Not at all  Social Connections: Moderately Integrated (10/04/2019)    Social Connection and Isolation Panel    Frequency of Communication with Friends and Family: More than three times a week    Frequency of Social Gatherings with Friends and Family: More than three times a week    Attends Religious Services: More than 4 times per year    Active Member of [employer] or Organizations: Yes    Attends Banker Meetings: More than 4 times per year    Marital Status: Widowed     Family History:  The patient's family history includes Cancer in his brother; Diabetes in his father; Ovarian cancer in his mother.  ROS:   Please see the history of present illness.    ROS All other systems reviewed and are negative.   PHYSICAL EXAM:   VS:  BP 137/74 (BP Location: Left Arm, Patient Position: Sitting, Cuff Size: Normal)   Pulse 77   Resp 16   Ht 6' (1.829 m)   Wt 195 lb 6.4 oz (88.6 kg)   SpO2 97%   BMI 26.50 kg/m    GEN: Obese  73 yo  in no acute distress  HEENT: normal  Neck: JVP is not elevated  Cardiac: RRR; no murmurs Respiratory:  clear to auscultation GI: soft, nontender  No hepatomegaly  Ext  No LE edema     Wt Readings from Last 3 Encounters:  08/12/23 195 lb 6.4 oz (88.6 kg)  07/16/23 247 lb (112 kg)  10/14/22 247 lb (112 kg)      Studies/Labs Reviewed:   EKG:  EKG not done today   Recent Labs: 08/14/2022: TSH 2.70   Lipid Panel    Component Value Date/Time   CHOL 164 01/14/2020 0740   TRIG 157 (H) 01/14/2020 0740  HDL 61 01/14/2020 0740   CHOLHDL 2.7 01/14/2020 0740   CHOLHDL 3 10/19/2018 0845   VLDL 15.8 10/19/2018 0845   LDLCALC 76 01/14/2020 0740   LDLCALC 83 04/10/2018 1529   LDLDIRECT 142.0 11/11/2016 1626    Additional studies/ records that were reviewed today include:   Stress test 11/12/17 IMPRESSION: 1. Large area of reversibility involving the mid and distal anterior, septal, and inferoapical walls, suspicious for inducible myocardial ischemia.   2. Mild left ventricular dilatation, without regional wall  motion abnormality.   3. Left ventricular ejection fraction 59%   4. Non invasive risk stratification*: High  LEFT HEART CATH AND CORONARY ANGIOGRAPHY  10/2017  Conclusion     Prox LAD lesion is 10% stenosed. Nonobstructive CAD. The left ventricular systolic function is normal. LV end diastolic pressure is mildly elevated. LVEDP 18 mm Hg. The left ventricular ejection fraction is 55-65% by visual estimate. There is no aortic valve stenosis.   Continue aggressive preventive therapy.        ASSESSMENT & PLAN:   1  HTN  Pt's BP is mildly increased today   At home, overall better.   I would follow for now especially given dizziness. Pt set up for labs by Dr Lazoff   2 .  CAD   Miinimal CAD at cath in 2019    Pt asymptomatic  3  LE edema   Occurred when off of hydrochlorothiazide     Resolved on spironolactone     3. OSA   Continue CPAP     4. HL  LDL 73 in Feb 2023  HDL 45   Keep on Crestor     Follow up next winter     Medication Adjustments/Labs and Tests Ordered: Current medicines are reviewed at length with the patient today.  Concerns regarding medicines are outlined above.  Medication changes, Labs and Tests ordered today are listed in the Patient Instructions below. Patient Instructions  Medication Instructions:   *If you need a refill on your cardiac medications before your next appointment, please call your pharmacy*  Lab Work: BMET, PRO BNP, TSH If you have labs (blood work) drawn today and your tests are completely normal, you will receive your results only by: MyChart Message (if you have MyChart) OR A paper copy in the mail If you have any lab test that is abnormal or we need to change your treatment, we will call you to review the results.  Testing/Procedures:   Follow-Up: At [facility], you and your health needs are our priority.  As part of our continuing mission to provide you with exceptional heart care, our providers are all part of one  team.  This team includes your primary Cardiologist (physician) and Advanced Practice Providers or APPs (Physician Assistants and Nurse Practitioners) who all work together to provide you with the care you need, when you need it.   We recommend signing up for the patient portal called MyChart.  Sign up information is provided on this After Visit Summary.  MyChart is used to connect with patients for Virtual Visits (Telemedicine).  Patients are able to view lab/test results, encounter notes, upcoming appointments, etc.  Non-urgent messages can be sent to your provider as well.   To learn more about what you can do with MyChart, go to [email or web address].   Other Instructions        Signed, Ola Berger, MD  08/12/2023 7:26 PM    [facility] [address] [place], [place],  [place]  [address] Phone: [phone]; Fax: [phone]

## 2023-08-12 ENCOUNTER — Encounter: Payer: Self-pay | Admitting: Internal Medicine

## 2023-08-12 ENCOUNTER — Ambulatory Visit: Attending: Internal Medicine | Admitting: Internal Medicine

## 2023-08-12 VITALS — BP 137/74 | HR 77 | Resp 16 | Ht 72.0 in | Wt 195.4 lb

## 2023-08-12 DIAGNOSIS — I10 Essential (primary) hypertension: Secondary | ICD-10-CM

## 2023-08-12 DIAGNOSIS — Z79899 Other long term (current) drug therapy: Secondary | ICD-10-CM | POA: Diagnosis not present

## 2023-08-12 DIAGNOSIS — I2510 Atherosclerotic heart disease of native coronary artery without angina pectoris: Secondary | ICD-10-CM

## 2023-08-12 DIAGNOSIS — E785 Hyperlipidemia, unspecified: Secondary | ICD-10-CM | POA: Diagnosis not present

## 2023-08-12 NOTE — Patient Instructions (Signed)
 Medication Instructions:   *If you need a refill on your cardiac medications before your next appointment, please call your pharmacy*  Lab Work: BMET, PRO BNP, TSH If you have labs (blood work) drawn today and your tests are completely normal, you will receive your results only by: MyChart Message (if you have MyChart) OR A paper copy in the mail If you have any lab test that is abnormal or we need to change your treatment, we will call you to review the results.  Testing/Procedures:   Follow-Up: At [facility], you and your health needs are our priority.  As part of our continuing mission to provide you with exceptional heart care, our providers are all part of one team.  This team includes your primary Cardiologist (physician) and Advanced Practice Providers or APPs (Physician Assistants and Nurse Practitioners) who all work together to provide you with the care you need, when you need it.   We recommend signing up for the patient portal called MyChart.  Sign up information is provided on this After Visit Summary.  MyChart is used to connect with patients for Virtual Visits (Telemedicine).  Patients are able to view lab/test results, encounter notes, upcoming appointments, etc.  Non-urgent messages can be sent to your provider as well.   To learn more about what you can do with MyChart, go to [email or web address].   Other Instructions

## 2023-10-05 ENCOUNTER — Other Ambulatory Visit: Payer: Self-pay | Admitting: Internal Medicine

## 2023-10-09 ENCOUNTER — Telehealth: Payer: Self-pay

## 2023-10-09 NOTE — Telephone Encounter (Signed)
   Pre-operative Risk Assessment    Patient Name: Dylan [name]  DOB: Feb 25, 1951 MRN: [id number]   Date of last office visit: 08/12/23 VINA GULL, MD Date of next office visit: NONE   Request for Surgical Clearance    Procedure:  RT TOTAL HIP ARTHROPLASTY  Date of Surgery:  Clearance 01/13/24                                Surgeon:  DR DONNICE CAR Surgeon's Group or Practice Name:  [facility] Phone number:  [phone] Fax number:  [phone]  ATTN: JOEN SIC   Type of Clearance Requested:   - Medical  - Pharmacy:  Hold Aspirin      Type of Anesthesia:  Spinal   Additional requests/questions:    SignedLucie DELENA Ku   10/09/2023, 4:50 PM

## 2023-10-10 NOTE — Telephone Encounter (Signed)
 Dr. Okey,  You saw this patient on 08/14/2023. Per protocol we request that you comment on his cardiac risk to proceed with RT TOTAL HIP ARTHROPLASTY in 01/13/2024 since it has been less than 2 months since evaluated in the office. Please send your comment to P CV Pre-Op Pool.  Thank you, Lamarr Satterfield DNP, ANP, AACC.

## 2023-10-10 NOTE — Telephone Encounter (Signed)
 Pt seen in clinic in June 2025    HE was doing well    From cardiac standpoint he is at low risk for major cardiac event with planned hip surgery  OK to proceed

## 2023-10-13 NOTE — Telephone Encounter (Signed)
   Patient Name: Dylan [name]  DOB: 07-07-1950 MRN: [id number]  Primary Cardiologist: Vina Gull, MD  Chart reviewed as part of pre-operative protocol coverage. Given past medical history and time since last visit, based on ACC/AHA guidelines, Dylan [name] is at acceptable risk for the planned procedure without further cardiovascular testing.   Per Dr. Gull: Pt seen in clinic in June 2025    HE was doing well    From cardiac standpoint he is at low risk for major cardiac event with planned hip surgery  OK to proceed  He may hold Aspirin  for 5-7 days prior to procedure. Please resume Aspirin  as soon as possible postprocedure, at the discretion of the surgeon.    I will route this recommendation to the requesting party via Epic fax function and remove from pre-op pool.  Please call with questions.  Lum LITTIE Louis, NP 10/13/2023, 11:49 AM

## 2023-11-03 ENCOUNTER — Ambulatory Visit: Admitting: Podiatry

## 2023-11-24 ENCOUNTER — Other Ambulatory Visit: Payer: Self-pay | Admitting: Internal Medicine

## 2023-11-24 DIAGNOSIS — E785 Hyperlipidemia, unspecified: Secondary | ICD-10-CM

## 2023-12-24 ENCOUNTER — Encounter (HOSPITAL_BASED_OUTPATIENT_CLINIC_OR_DEPARTMENT_OTHER): Payer: Self-pay | Admitting: Emergency Medicine

## 2023-12-24 ENCOUNTER — Emergency Department (HOSPITAL_BASED_OUTPATIENT_CLINIC_OR_DEPARTMENT_OTHER)

## 2023-12-24 ENCOUNTER — Other Ambulatory Visit: Payer: Self-pay

## 2023-12-24 ENCOUNTER — Observation Stay (HOSPITAL_BASED_OUTPATIENT_CLINIC_OR_DEPARTMENT_OTHER)
Admission: EM | Admit: 2023-12-24 | Discharge: 2023-12-26 | Disposition: A | Discharge status: Home / Self Care | Attending: Neurology | Admitting: Neurology

## 2023-12-24 DIAGNOSIS — D500 Iron deficiency anemia secondary to blood loss (chronic): Secondary | ICD-10-CM | POA: Diagnosis not present

## 2023-12-24 DIAGNOSIS — M1990 Unspecified osteoarthritis, unspecified site: Secondary | ICD-10-CM | POA: Diagnosis not present

## 2023-12-24 DIAGNOSIS — R918 Other nonspecific abnormal finding of lung field: Secondary | ICD-10-CM | POA: Diagnosis not present

## 2023-12-24 DIAGNOSIS — Z79899 Other long term (current) drug therapy: Secondary | ICD-10-CM | POA: Insufficient documentation

## 2023-12-24 DIAGNOSIS — K921 Melena: Principal | ICD-10-CM | POA: Insufficient documentation

## 2023-12-24 DIAGNOSIS — Z85828 Personal history of other malignant neoplasm of skin: Secondary | ICD-10-CM | POA: Diagnosis not present

## 2023-12-24 DIAGNOSIS — D62 Acute posthemorrhagic anemia: Secondary | ICD-10-CM

## 2023-12-24 DIAGNOSIS — K259 Gastric ulcer, unspecified as acute or chronic, without hemorrhage or perforation: Secondary | ICD-10-CM | POA: Diagnosis not present

## 2023-12-24 DIAGNOSIS — E871 Hypo-osmolality and hyponatremia: Secondary | ICD-10-CM | POA: Diagnosis present

## 2023-12-24 DIAGNOSIS — G4733 Obstructive sleep apnea (adult) (pediatric): Secondary | ICD-10-CM | POA: Insufficient documentation

## 2023-12-24 DIAGNOSIS — K922 Gastrointestinal hemorrhage, unspecified: Secondary | ICD-10-CM | POA: Diagnosis present

## 2023-12-24 DIAGNOSIS — Z743 Need for continuous supervision: Secondary | ICD-10-CM | POA: Diagnosis not present

## 2023-12-24 DIAGNOSIS — E785 Hyperlipidemia, unspecified: Secondary | ICD-10-CM | POA: Insufficient documentation

## 2023-12-24 DIAGNOSIS — R531 Weakness: Secondary | ICD-10-CM | POA: Diagnosis not present

## 2023-12-24 DIAGNOSIS — I10 Essential (primary) hypertension: Secondary | ICD-10-CM | POA: Diagnosis not present

## 2023-12-24 DIAGNOSIS — I2510 Atherosclerotic heart disease of native coronary artery without angina pectoris: Secondary | ICD-10-CM | POA: Insufficient documentation

## 2023-12-24 DIAGNOSIS — R1030 Lower abdominal pain, unspecified: Secondary | ICD-10-CM

## 2023-12-24 DIAGNOSIS — K254 Chronic or unspecified gastric ulcer with hemorrhage: Secondary | ICD-10-CM

## 2023-12-24 LAB — HEMOGLOBIN AND HEMATOCRIT, BLOOD
HCT: 23.2 % — ABNORMAL LOW (ref 39.0–52.0)
Hemoglobin: 7.9 g/dL — ABNORMAL LOW (ref 13.0–17.0)

## 2023-12-24 LAB — CBC WITH DIFFERENTIAL/PLATELET
Abs Immature Granulocytes: 0.03 K/uL (ref 0.00–0.07)
Basophils Absolute: 0 K/uL (ref 0.0–0.1)
Basophils Relative: 0 %
Eosinophils Absolute: 0.1 K/uL (ref 0.0–0.5)
Eosinophils Relative: 1 %
HCT: 24.5 % — ABNORMAL LOW (ref 39.0–52.0)
Hemoglobin: 8.3 g/dL — ABNORMAL LOW (ref 13.0–17.0)
Immature Granulocytes: 0 %
Lymphocytes Relative: 40 %
Lymphs Abs: 3.1 K/uL (ref 0.7–4.0)
MCH: 29.4 pg (ref 26.0–34.0)
MCHC: 33.9 g/dL (ref 30.0–36.0)
MCV: 86.9 fL (ref 80.0–100.0)
Monocytes Absolute: 0.6 K/uL (ref 0.1–1.0)
Monocytes Relative: 7 %
Neutro Abs: 4 K/uL (ref 1.7–7.7)
Neutrophils Relative %: 52 %
Platelets: 249 K/uL (ref 150–400)
RBC: 2.82 MIL/uL — ABNORMAL LOW (ref 4.22–5.81)
RDW: 12.6 % (ref 11.5–15.5)
WBC: 7.8 K/uL (ref 4.0–10.5)
nRBC: 0 % (ref 0.0–0.2)

## 2023-12-24 LAB — COMPREHENSIVE METABOLIC PANEL WITH GFR
ALT: 30 U/L (ref 0–44)
AST: 26 U/L (ref 15–41)
Albumin: 3.9 g/dL (ref 3.5–5.0)
Alkaline Phosphatase: 50 U/L (ref 38–126)
Anion gap: 9 (ref 5–15)
BUN: 25 mg/dL — ABNORMAL HIGH (ref 8–23)
CO2: 23 mmol/L (ref 22–32)
Calcium: 9.4 mg/dL (ref 8.9–10.3)
Chloride: 97 mmol/L — ABNORMAL LOW (ref 98–111)
Creatinine, Ser: 0.8 mg/dL (ref 0.61–1.24)
GFR, Estimated: >60 mL/min (ref >60)
Glucose, Bld: 112 mg/dL — ABNORMAL HIGH (ref 70–99)
Potassium: 4.1 mmol/L (ref 3.5–5.1)
Sodium: 129 mmol/L — ABNORMAL LOW (ref 135–145)
Total Bilirubin: 0.3 mg/dL (ref 0.0–1.2)
Total Protein: 6 g/dL — ABNORMAL LOW (ref 6.5–8.1)

## 2023-12-24 LAB — CBG MONITORING, ED: Glucose-Capillary: 86 mg/dL (ref 70–99)

## 2023-12-24 LAB — OCCULT BLOOD X 1 CARD TO LAB, STOOL: Fecal Occult Bld: POSITIVE — AB

## 2023-12-24 MED ORDER — SODIUM CHLORIDE 0.9 % IV SOLN: INTRAVENOUS | Status: DC

## 2023-12-24 MED ORDER — OXYCODONE-ACETAMINOPHEN 5-325 MG PO TABS
2.0000 | ORAL_TABLET | Freq: Once | ORAL | Status: AC
  Administered 2023-12-24: 2 via ORAL
  Filled 2023-12-24 (×2): qty 2

## 2023-12-24 MED ORDER — ACETAMINOPHEN 500 MG PO TABS: 500.0000 mg | ORAL_TABLET | Freq: Four times a day (QID) | ORAL | Status: DC | PRN

## 2023-12-24 MED ORDER — PANTOPRAZOLE SODIUM 40 MG IV SOLR
40.0000 mg | Freq: Once | INTRAVENOUS | Status: AC
  Administered 2023-12-24: 40 mg via INTRAVENOUS
  Filled 2023-12-24: qty 10

## 2023-12-24 MED ORDER — PANTOPRAZOLE SODIUM 40 MG IV SOLR
40.0000 mg | Freq: Two times a day (BID) | INTRAVENOUS | Status: DC
  Administered 2023-12-24 – 2023-12-26 (×4): 40 mg via INTRAVENOUS
  Filled 2023-12-24 (×4): qty 10

## 2023-12-24 MED ORDER — HYDROMORPHONE HCL 1 MG/ML IJ SOLN
0.5000 mg | INTRAMUSCULAR | Status: DC | PRN
  Administered 2023-12-24 – 2023-12-25 (×4): 0.5 mg via INTRAVENOUS
  Filled 2023-12-24 (×3): qty 0.5
  Filled 2023-12-24: qty 1

## 2023-12-24 MED ORDER — ONDANSETRON HCL 4 MG/2ML IJ SOLN
4.0000 mg | Freq: Three times a day (TID) | INTRAMUSCULAR | Status: DC | PRN
  Administered 2023-12-24: 4 mg via INTRAVENOUS
  Filled 2023-12-24: qty 2

## 2023-12-24 MED ORDER — SODIUM CHLORIDE 0.9 % IV BOLUS
500.0000 mL | Freq: Once | INTRAVENOUS | Status: AC
  Administered 2023-12-24: 500 mL via INTRAVENOUS

## 2023-12-24 MED ORDER — POLYETHYLENE GLYCOL 3350 17 G PO PACK: 17.0000 g | PACK | Freq: Every day | ORAL | Status: DC | PRN

## 2023-12-24 MED ORDER — MELATONIN 5 MG PO TABS: 5.0000 mg | ORAL_TABLET | Freq: Every evening | ORAL | Status: DC | PRN

## 2023-12-24 MED ORDER — HYDROMORPHONE HCL 1 MG/ML IJ SOLN
1.0000 mg | Freq: Once | INTRAMUSCULAR | Status: AC
  Administered 2023-12-24: 1 mg via INTRAVENOUS
  Filled 2023-12-24: qty 1

## 2023-12-24 MED ORDER — IOHEXOL 350 MG/ML SOLN
100.0000 mL | Freq: Once | INTRAVENOUS | Status: AC | PRN
  Administered 2023-12-24: 100 mL via INTRAVENOUS

## 2023-12-24 MED ORDER — MORPHINE SULFATE (PF) 2 MG/ML IV SOLN
2.0000 mg | INTRAVENOUS | Status: DC | PRN
  Filled 2023-12-24: qty 1

## 2023-12-24 MED ORDER — OXYCODONE HCL 5 MG PO TABS
5.0000 mg | ORAL_TABLET | Freq: Four times a day (QID) | ORAL | Status: DC | PRN
  Filled 2023-12-24: qty 1

## 2023-12-24 NOTE — ED Notes (Addendum)
 Urine out - 

## 2023-12-24 NOTE — ED Triage Notes (Addendum)
 Pt bib wheelchair, c/o black stools x 2 days, weakness and dizziness with near syncope . Also reports lower mild abd pain. Endorses thinners

## 2023-12-24 NOTE — ED Provider Notes (Signed)
  Physical Exam  BP 132/68 (BP Location: Right Arm)   Pulse 72   Temp 98.1 F (36.7 C) (Oral)   Resp 18   Wt 78.9 kg   SpO2 100%   BMI 23.60 kg/m   Physical Exam  Procedures  Procedures  ED Course / MDM   Clinical Course as of 12/25/23 0056  Wed Dec 24, 2023  1358 Assumed care from Dr Tonia. 73 yo M hx of peptic ulcer disease and GIB who presented with symptomatic anemia from GIB x2 days mild lower abdominal pain. Having melena. Hemoccult positive. Getting CTA gi bleed. Also with mild hyponatremia. Getting NS bolus. Will need timely transfer. Awaiting callback from GI. Last dose of eliquis was yesterday at 9 am.  [RP]  1411 Hemoglobin(!): 8.3 Baseline of 15 [RP]  1445 CTA without acute findings.  Did discuss with Dr Stacia from [facility] who recommends CLD for now. PPI and EGD tomorrow.  Recommends admission to [facility]. [RP]  1809 Dr Cherlyn from hospitalist to admit for GI bleed [RP]  Thu Dec 25, 2023  0056 Patient transported to [facility] without any acute events.  Remained hemodynamically stable in the emergency department. [RP]    Clinical Course User Index [RP] Yolande Lamar BROCKS, MD   Medical Decision Making Amount and/or Complexity of Data Reviewed Labs: ordered. Decision-making details documented in ED Course. Radiology: ordered.  Risk Prescription drug management. Decision regarding hospitalization.      Yolande Lamar BROCKS, MD 12/25/23 [phone]

## 2023-12-24 NOTE — ED Provider Notes (Signed)
 Dylan [name] Provider Note   CSN: [id number] Arrival date & time: 12/24/23  1056     Patient presents with: Dizziness and Melena   Dylan [name] is a 73 y.o. male.   Patient presents with dark stools, weakness and lightheadedness last 2 days.  Patient had GI bleed/ulcer years ago but has been doing well recently.  Patient denies any anticoagulant use.  Patient is followed with [facility] Dr. Patient had lower central abdominal discomfort nonradiating.  The history is provided by the patient.  Dizziness Associated symptoms: no chest pain, no headaches, no shortness of breath and no vomiting        Prior to Admission medications   Medication Sig Start Date End Date Taking? Authorizing Provider  spironolactone (ALDACTONE) 25 MG tablet Take 25 mg by mouth daily. 12/03/23  Yes [provider]  traZODone (DESYREL) 50 MG tablet Take 25 mg by mouth at bedtime as needed for sleep. 10/30/23  Yes [provider]  acetaminophen  (TYLENOL ) 650 MG CR tablet Take 1,300 mg by mouth every 8 (eight) hours as needed for pain.    [provider]  amLODipine  (NORVASC ) 10 MG tablet TAKE 1 TABLET BY MOUTH EVERY DAY 11/25/23   Dylan Vina GAILS, MD  aspirin  EC 81 MG tablet Take by mouth daily. 11/26/19   [provider]  Biotin w/ Vitamins C & E (HAIR SKIN & NAILS GUMMIES PO) Take 2 tablets by mouth daily.    [provider]  carvedilol  (COREG ) 12.5 MG tablet TAKE 1 TABLET BY MOUTH TWICE A DAY 10/07/23   [name], [name], MD  cetirizine (ZYRTEC) 10 MG tablet Take 10 mg by mouth daily as needed for allergies.    [provider]  Coenzyme Q10 (COQ10) 200 MG CAPS Take 200 mg by mouth daily.    [provider]  diclofenac (VOLTAREN) 75 MG EC tablet Take 75 mg by mouth 2 (two) times daily. 08/05/23   [provider]  escitalopram (LEXAPRO) 20 MG tablet Take 10 mg by mouth daily. 12/11/20   [provider]  esomeprazole  (NEXIUM ) 40 MG capsule TAKE 1 CAPSULE (40 MG TOTAL) BY MOUTH DAILY. 05/03/22   Dylan Iha, MD  fluticasone  (FLONASE ) 50 MCG/ACT nasal spray Place 2 sprays into both nostrils daily. 05/21/23   Dylan Delon HERO, [name]  gabapentin  (NEURONTIN ) 300 MG capsule PLEASE SEE ATTACHED FOR DETAILED DIRECTIONS    [provider]  irbesartan  (AVAPRO ) 150 MG tablet Take 1 tablet (150 mg total) by mouth daily. 11/25/22   Dylan Vina GAILS, MD  Melatonin 5 MG TABS Take 10 mg by mouth at bedtime as needed (sleep).    [provider]  MOUNJARO 2.5 MG/0.5ML Pen Inject 2.5 mg into the skin once a week.    [provider]  oxyCODONE -acetaminophen  (PERCOCET) 10-325 MG tablet Take 1 tablet by mouth 4 (four) times daily.    [provider]  rosuvastatin  (CRESTOR ) 20 MG tablet TAKE 1 TABLET BY MOUTH EVERY DAY 11/25/23   Dylan Vina GAILS, MD  sulfamethoxazole -trimethoprim  (BACTRIM  DS) 800-160 MG tablet Take 1 tablet by mouth 2 (two) times daily.    [provider]  vitamin B-12 (CYANOCOBALAMIN ) 500 MCG tablet Take 1,000 mcg by mouth daily.    [provider]  vitamin C (ASCORBIC ACID) 500 MG tablet Take 1,000 mg by mouth daily.    [provider]    Allergies: Heparin , Succinylcholine chloride, Vancomycin , and Penicillins  Review of Systems  Constitutional:  Negative for chills and fever.  HENT:  Negative for congestion.   Eyes:  Negative for visual disturbance.  Respiratory:  Negative for shortness of breath.   Cardiovascular:  Negative for chest pain.  Gastrointestinal:  Positive for abdominal pain. Negative for vomiting.  Genitourinary:  Negative for dysuria and flank pain.  Musculoskeletal:  Negative for back pain, neck pain and neck stiffness.  Skin:  Negative for rash.  Neurological:  Positive for dizziness and light-headedness. Negative for headaches.    Updated Vital Signs BP (!) 118/57   Pulse 77   Temp 97.6 F (36.4 C)  (Oral)   Resp 16   Wt 78.9 kg   SpO2 100%   BMI 23.60 kg/m   Physical Exam Vitals and nursing note reviewed.  Constitutional:      General: He is not in acute distress.    Appearance: He is well-developed.  HENT:     Head: Normocephalic and atraumatic.     Mouth/Throat:     Mouth: Mucous membranes are dry.  Eyes:     General:        Right eye: No discharge.        Left eye: No discharge.     Conjunctiva/sclera: Conjunctivae normal.  Neck:     Trachea: No tracheal deviation.  Cardiovascular:     Rate and Rhythm: Normal rate and regular rhythm.  Pulmonary:     Effort: Pulmonary effort is normal.     Breath sounds: Normal breath sounds.  Abdominal:     General: There is no distension.     Palpations: Abdomen is soft.     Tenderness: There is abdominal tenderness (mild lower abd midline). There is no guarding.  Genitourinary:    Comments: Melanotic stool no external hemorrhoids Musculoskeletal:     Cervical back: Normal range of motion and neck supple. No rigidity.  Skin:    General: Skin is warm.     Capillary Refill: Capillary refill takes less than 2 seconds.     Findings: No rash.  Neurological:     General: No focal deficit present.     Mental Status: He is alert.  Psychiatric:        Mood and Affect: Mood normal.     (all labs ordered are listed, but only abnormal results are displayed) Labs Reviewed  CBC WITH DIFFERENTIAL/PLATELET - Abnormal; Notable for the following components:      Result Value   RBC 2.82 (*)    Hemoglobin 8.3 (*)    HCT 24.5 (*)    All other components within normal limits  COMPREHENSIVE METABOLIC PANEL WITH GFR - Abnormal; Notable for the following components:   Sodium 129 (*)    Chloride 97 (*)    Glucose, Bld 112 (*)    BUN 25 (*)    Total Protein 6.0 (*)    All other components within normal limits  OCCULT BLOOD X 1 CARD TO LAB, STOOL - Abnormal; Notable for the following components:   Fecal Occult Bld POSITIVE (*)    All  other components within normal limits  CBG MONITORING, ED    EKG: None  Radiology: No results found.   .Critical Care  Performed by: Dylan Chew, MD Authorized by: Dylan Chew, MD   Critical care provider statement:    Critical care time (minutes):  30   Critical care start time:  12/24/2023 12:30 PM   Critical care end time:  12/24/2023 1:00 PM  Critical care time was exclusive of:  Separately billable procedures and treating other patients and teaching time   Critical care was time spent personally by me on the following activities:  Ordering and review of laboratory studies, ordering and review of radiographic studies, ordering and performing treatments and interventions, pulse oximetry, re-evaluation of patient's condition and discussions with consultants Comments:     Acute GI bleed    Medications Ordered in the ED  oxyCODONE -acetaminophen  (PERCOCET/ROXICET) 5-325 MG per tablet 2 tablet (has no administration in time range)  sodium chloride  0.9 % bolus 500 mL (500 mLs Intravenous New Bag/Given 12/24/23 1316)  pantoprazole  (PROTONIX ) injection 40 mg (40 mg Intravenous Given 12/24/23 1314)                                    Medical Decision Making Amount and/or Complexity of Data Reviewed Labs: ordered. Radiology: ordered.  Risk Prescription drug management.   Patient presents with new onset melena, weakness, lightheadedness and lower abdominal pain with clinical concern for symptoms secondary to anemia.  GI bleed differential includes ulcer, colitis, ischemia, other.  Blood work independently reviewed hemoglobin dropped to 8.3, last was 11.7 on October 7 reviewed medical records.  Hemoccult positive and melanotic stool.  IV fluid bolus given for mild hyponatremia and decreased appetite the last 2 days.  Sodium 129 reviewed independently.  Plan for CT abdomen pelvis followed by transfer to hospitalist with [facility] consult.  Protonix  ordered IV.  Patient  comfortable plan.  Patient care be signed out to follow-up CT scan results and admit/transfer.     Final diagnoses:  Melena  Lower abdominal pain  Hyponatremia    ED Discharge Orders     None          Dylan Chew, MD 12/24/23 1352

## 2023-12-24 NOTE — ED Notes (Signed)
 Called [facility] to transport the patient to [place] [facility] rm# [phone]

## 2023-12-24 NOTE — ED Notes (Signed)
 New room, [facility] [place]

## 2023-12-24 NOTE — H&P (Incomplete)
 History and Physical  Dylan [name] FMW:[id number] DOB: 12-17-1950 DOA: 12/24/2023  Referring physician: Accepted by Dr. Akula, [facility], hospitalist service. PCP: [name], Dylan P, DO  Outpatient Specialists: Cardiology. Patient coming from: Home through [facility] ED.  Chief Complaint: Black stools x 2 days, mid abdominal pain, lightheadedness.  HPI: Dylan [name] is a 73 y.o. male with medical history significant for hypertension, hyperlipidemia, diet-controlled type 2 diabetes, degenerative joint disease, GERD, obstructive sleep apnea on CPAP, coronary artery disease on aspirin , history of distal gastric ulcer(2023), distal gastritis(2023), left colon diverticulosis(2023), chronic back pain on chronic opiate use, ambulatory dysfunction uses a cane at baseline, who presented to the ER from home due to black stools x 2 days associated with mid abdominal pain and lightheadedness.  Also endorses generalized weakness.  Denies chest pain or palpitations.  Has been feeling more short winded.  Denies any recent or frequent use of NSAIDs.  Last dose of ibuprofen was about a month ago.  Last bowel movement was this morning, the stool was black and tarry in appearance.  In the ER, noted to have hemoglobin of 8.3 K from baseline of 15 K on 03/18/2022 with positive FOBT.  The patient received IV PPI Protonix  40 mg x 1, NS bolus 500 cc x 1, and 2 tablets of Percocet in the ER.  Admitted by [facility], hospitalist service.  Accepted by Dr. Cherlyn and transferred to [facility] MedSurg unit as inpatient status.  ED Course: Temperature 98.1.  BP 132/68, pulse 72, respiration rate 18, O2 saturation 100% on room air.  Review of Systems: Review of systems as noted in the HPI. All other systems reviewed and are negative.   Past Medical History:  Diagnosis Date   Allergy    allergic rhinitis   Anxiety    Arthritis    R knee, hands , back oa   BPH (benign prostatic hyperplasia)    Cancer (HCC)    melonoma- on  face, treated with excision    Complication of anesthesia    trouble waking up with succycholine, does not have enzyme to break down succycholine   Coronary artery disease    nonobstructive   Dizziness    MRI brain unremarkable 02/05/2015. resolved as of 01-27-2020   History of melanoma 15 yrs ago   face and forehead-- previously followed by Dylan [name] in [place]    History of migraine    none recent as of 01-27-2020   History of ulcerative colitis    non recent flares   Hyperlipidemia    Hypertension    Pseudocholinesterase deficiency    Skin cancer    Sleep apnea    last sleep study 10 yrs ago,CPAP-  no current cpap use    Past Surgical History:  Procedure Laterality Date   acl and mensicus repair reconstruct Right 1980's   ANTERIOR CERVICAL DECOMP/DISCECTOMY FUSION  10/15/2011   Procedure: ANTERIOR CERVICAL DECOMPRESSION/DISCECTOMY FUSION 1 LEVEL;  Surgeon: Dylan Gens, MD;  Location: [facility] NEURO ORS;  Service: Neurosurgery;  Laterality: N/A;  Cervical four-five Anterior cervical decompression/diskectomy/fusion   APPENDECTOMY  1969   COLONOSCOPY  last done 2018   ELBOW SURGERY Right 2019   tendon replacement   HERNIA REPAIR     inguinal / abdominal - repaired as an infant    JOINT REPLACEMENT     KNEE ARTHROSCOPY Left last done oct 2013   x 3   KNEE SURGERY  yrs ago   right knee   LEFT HEART CATH AND CORONARY  ANGIOGRAPHY N/A 11/14/2017   Procedure: LEFT HEART CATH AND CORONARY ANGIOGRAPHY;  Surgeon: Dylan Candyce RAMAN, MD;  Location[facility] INVASIVE CV LAB;  Service: Cardiovascular;  Laterality: N/A;   LUMBAR LAMINECTOMY WITH COFLEX 2 LEVEL N/A 09/12/2015   Procedure: L3-4 L4-5 Laminectomy with coflex;  Surgeon: Dylan Gens, MD;  Location: [facility] NEUR[facility] ORS;  Service: Neurosurgery;  Laterality: N/A;  L3-4 L4-5 Laminectomy with coflex   NASAL SINUS SURGERY     multiple sinus surgery - 1980's    SKIN CANCER EXCISION  2023   TONSILLECTOMY  1969   TOTAL KNEE ARTHROPLASTY Left 10/22/2012    Procedure: TOTAL KNEE ARTHROPLASTY;  Surgeon: Dylan JAYSON Billing, MD;  Location: [facility] ORS;  Service: Orthopedics;  Laterality: Left;   TOTAL KNEE REVISION WITH SCAR DEBRIDEMENT/PATELLA REVISION WITH POLY EXCHANGE Left 05/18/2020   Procedure: Left knee excision saphenous nerve branch, neurectomy, left open scar excision with polyethlene revision;  Surgeon: Dylan Cough, MD;  Location: [facility] ORS;  Service: Orthopedics;  Laterality: Left;  90 mins Spinal with adductor block   TRANSURETHRAL RESECTION OF PROSTATE N/A 01/31/2020   Procedure: TRANSURETHRAL RESECTION OF THE PROSTATE (TURP);  Surgeon: Dylan Senior, MD;  Location: [facility];  Service: Urology;  Laterality: N/A;  76 MIS   UPPER GASTROINTESTINAL ENDOSCOPY      Social History:  reports that he has never smoked. He quit smokeless tobacco use about 20 years ago.  His smokeless tobacco use included chew. He reports that he does not drink alcohol and does not use drugs.   Allergies  Allergen Reactions   Heparin  Anaphylaxis    breathing problems   Succinylcholine Chloride Other (See Comments)    does not wake up from anesthetic, pt. Reports that through testing that he was found  not to have the enzyme to breakdown the Succ.   Vancomycin  Hives and Other (See Comments)    Ran off a pump. So developed redman's . Running too fast   Penicillins Rash    Did it involve swelling of the face/tongue/throat, SOB, or low BP? No Did it involve sudden or severe rash/hives, skin peeling, or any reaction on the inside of your mouth or nose? No Did you need to seek medical attention at a hospital or doctor's office? No When did it last happen?     childhood  If all above answers are NO, may proceed with cephalosporin use.  Tolerated Cephalosporin Date: 05/18/20.      Family History  Problem Relation Age of Onset   Ovarian cancer Mother    Diabetes Father    Cancer Brother        brain tumor   Colon cancer Neg Hx    Stomach  cancer Neg Hx    Rectal cancer Neg Hx    Esophageal cancer Neg Hx    Liver cancer Neg Hx    Colon polyps Neg Hx       Prior to Admission medications   Medication Sig Start Date End Date Taking? Authorizing Provider  spironolactone (ALDACTONE) 25 MG tablet Take 25 mg by mouth daily. 12/03/23  Yes [provider]  traZODone (DESYREL) 50 MG tablet Take 25 mg by mouth at bedtime as needed for sleep. 10/30/23  Yes [provider]  acetaminophen  (TYLENOL ) 650 MG CR tablet Take 1,300 mg by mouth every 8 (eight) hours as needed for pain.    [provider]  amLODipine  (NORVASC ) 10 MG tablet TAKE 1 TABLET BY MOUTH EVERY DAY 11/25/23  [name], MD  aspirin  EC 81 MG tablet Take by mouth daily. 11/26/19   [provider]  Biotin w/ Vitamins C & E (HAIR SKIN & NAILS GUMMIES PO) Take 2 tablets by mouth daily.    [provider]  carvedilol  (COREG ) 12.5 MG tablet TAKE 1 TABLET BY MOUTH TWICE A DAY 10/07/23   [name], [name], MD  cetirizine (ZYRTEC) 10 MG tablet Take 10 mg by mouth daily as needed for allergies.    [provider]  Coenzyme Q10 (COQ10) 200 MG CAPS Take 200 mg by mouth daily.    [provider]  diclofenac (VOLTAREN) 75 MG EC tablet Take 75 mg by mouth 2 (two) times daily. 08/05/23   [provider]  escitalopram (LEXAPRO) 20 MG tablet Take 10 mg by mouth daily. 12/11/20   [provider]  esomeprazole  (NEXIUM ) 40 MG capsule TAKE 1 CAPSULE (40 MG TOTAL) BY MOUTH DAILY. 05/03/22   [name], MD  fluticasone  (FLONASE ) 50 MCG/ACT nasal spray Place 2 sprays into both nostrils daily. 05/21/23   [name], [name]  gabapentin  (NEURONTIN ) 300 MG capsule PLEASE SEE ATTACHED FOR DETAILED DIRECTIONS    [provider]  irbesartan  (AVAPRO ) 150 MG tablet Take 1 tablet (150 mg total) by mouth daily. 11/25/22   [name], MD  Melatonin 5 MG TABS Take 10 mg by mouth at bedtime as needed (sleep).     [provider]  MOUNJARO 2.5 MG/0.5ML Pen Inject 2.5 mg into the skin once a week.    [provider]  oxyCODONE -acetaminophen  (PERCOCET) 10-325 MG tablet Take 1 tablet by mouth 4 (four) times daily.    [provider]  rosuvastatin  (CRESTOR ) 20 MG tablet TAKE 1 TABLET BY MOUTH EVERY DAY 11/25/23   [name], MD  sulfamethoxazole -trimethoprim  (BACTRIM  DS) 800-160 MG tablet Take 1 tablet by mouth 2 (two) times daily.    [provider]  vitamin B-12 (CYANOCOBALAMIN ) 500 MCG tablet Take 1,000 mcg by mouth daily.    [provider]  vitamin C (ASCORBIC ACID) 500 MG tablet Take 1,000 mg by mouth daily.    [provider]    Physical Exam: BP 132/68 (BP Location: Right Arm)   Pulse 72   Temp 98.1 F (36.7 C) (Oral)   Resp 18   Wt 78.9 kg   SpO2 100%   BMI 23.60 kg/m   General: 73 y.o. year-old male well developed well nourished in no acute distress.  Alert and oriented x3. Cardiovascular: Regular rate and rhythm with no rubs or gallops.  No thyromegaly or JVD noted.  No lower extremity edema. 2/4 pulses in all 4 extremities. Respiratory: Clear to auscultation with no wheezes or rales. Good inspiratory effort. Abdomen: Soft epigastric tenderness on palpation.  Nondistended with normal bowel sounds x4 quadrants. Muskuloskeletal: No cyanosis, clubbing or edema noted bilaterally Neuro: CN II-XII intact, strength, sensation, reflexes Skin: No ulcerative lesions noted or rashes Psychiatry: Judgement and insight appear normal. Mood is appropriate for condition and setting          Labs on Admission:  Basic Metabolic Panel: Recent Labs  Lab 12/24/23 1134  NA 129*  K 4.1  CL 97*  CO2 23  GLUCOSE 112*  BUN 25*  CREATININE 0.80  CALCIUM  9.4   Liver Function Tests: Recent Labs  Lab 12/24/23 1134  AST 26  ALT 30  ALKPHOS 50  BILITOT 0.3  PROT 6.0*  ALBUMIN 3.9   No  results for input(s): LIPASE, AMYLASE in the last 168  hours. No results for input(s): AMMONIA in the last 168 hours. CBC: Recent Labs  Lab 12/24/23 1134  WBC 7.8  NEUTROABS 4.0  HGB 8.3*  HCT 24.5*  MCV 86.9  PLT 249   Cardiac Enzymes: No results for input(s): CKTOTAL, CKMB, CKMBINDEX, TROPONINI in the last 168 hours.  BNP (last 3 results) No results for input(s): BNP in the last 8760 hours.  ProBNP (last 3 results) No results for input(s): PROBNP in the last 8760 hours.  CBG: Recent Labs  Lab 12/24/23 1124  GLUCAP 86    Radiological Exams on Admission: CT ANGIO GI BLEED Result Date: 12/24/2023 CLINICAL DATA:  GI bleed. EXAM: CTA ABDOMEN AND PELVIS WITHOUT AND WITH CONTRAST TECHNIQUE: Multidetector CT imaging of the abdomen and pelvis was performed using the standard protocol during bolus administration of intravenous contrast. Multiplanar reconstructed images and MIPs were obtained and reviewed to evaluate the vascular anatomy. RADIATION DOSE REDUCTION: This exam was performed according to the departmental dose-optimization program which includes automated exposure control, adjustment of the mA and/or kV according to patient size and/or use of iterative reconstruction technique. CONTRAST:  OMNIPAQUE  IOHEXOL  350 MG/ML SOLN COMPARISON:  CT scan abdomen and pelvis from 04/04/2021. FINDINGS: VASCULAR Aorta: Normal caliber aorta without aneurysm, dissection, vasculitis or significant stenosis. Mild-to-moderate peripheral atherosclerotic vascular calcifications noted. Celiac: Mild-to-moderate narrowing at the origin due to calcified plaques. Otherwise, patent without evidence of aneurysm, dissection or vasculitis. SMA: Patent without evidence of aneurysm, dissection, vasculitis or significant stenosis. Renals: Both renal arteries are patent without evidence of aneurysm, dissection, vasculitis, fibromuscular dysplasia or significant stenosis. IMA: Patent without evidence of aneurysm, dissection, vasculitis or significant  stenosis. Inflow: Patent without evidence of aneurysm, dissection, vasculitis or significant stenosis. Proximal Outflow: Bilateral common femoral and visualized portions of the superficial and profunda femoral arteries are patent without evidence of aneurysm, dissection, vasculitis or significant stenosis. Veins: No obvious venous abnormality within the limitations of this arterial phase study. Review of the MIP images confirms the above findings. NON-VASCULAR Lower chest: There are patchy ground-glass opacities in the right lung lower lobe which are new since the prior study. These are nonspecific and may represent sequela of infection or inflammation. Follow-up examination is recommended in 3 months to document stability versus resolution. Bilateral lungs are otherwise clear. No consolidation, collapse, lung mass or pleural effusion. The heart is normal in size. No pericardial effusion. Hepatobiliary: The liver is normal in size. Non-cirrhotic configuration. No suspicious mass. No intrahepatic or extrahepatic bile duct dilation. No calcified gallstones. Normal gallbladder wall thickness. No pericholecystic inflammatory changes. Pancreas: Unremarkable. No pancreatic ductal dilatation or surrounding inflammatory changes. Spleen: Within normal limits. No focal lesion. Adrenals/Urinary Tract: Adrenal glands are unremarkable. No suspicious renal mass. There are at least 2 simple cysts in the right kidney with largest in the lower pole measuring up to 1.6 x 1.7 cm. No suspicious renal mass seen. No nephroureterolithiasis or obstructive uropathy. Unremarkable urinary bladder. Stomach/Bowel: No disproportionate dilation of the small or large bowel loops. No evidence of abnormal bowel wall thickening or inflammatory changes. The appendix was not visualized; however there is no acute inflammatory process in the right lower quadrant. There are multiple scattered colonic diverticula without diverticulitis. Vascular/Lymphatic:  No ascites or pneumoperitoneum. No abdominal or pelvic lymphadenopathy, by size criteria. Reproductive: Normal size prostate. Probable post TURP defect noted. Symmetric seminal vesicles. Other: The visualized soft tissues and abdominal wall are unremarkable. Musculoskeletal: No suspicious  osseous lesions. There are mild multilevel degenerative changes in the visualized spine. Coflex interspinous distraction device noted at L3-4 and L4-5 spinal processes level. IMPRESSION: 1. No acute inflammatory process identified within the abdomen or pelvis. 2. No active extravasation of contrast noted to suggest active GI bleeding. 3. There are patchy ground-glass opacities in the right lung lower lobe, which are nonspecific and may represent sequela of infection or inflammation. Follow-up examination is recommended in 3 months to document stability versus resolution. Bilateral lungs are otherwise clear. 4. Multiple other nonacute observations, as described above. Aortic Atherosclerosis (ICD10-I70.0). Electronically Signed   By: Ree Molt M.D.   On: 12/24/2023 14:43    EKG: I independently viewed the EKG done and my findings are as followed: None available at the time of this visit.  Assessment/Plan Present on Admission:  GI bleed  Principal Problem:   GI bleed  Suspected upper GI bleed Endorses melena for the past 2 days associated with lightheadedness and epigastric pain. History of distal gastric ulcer and distal gastritis Continue IV PPI Protonix  40 mg twice daily [facility] has been consulted N.p.o. after midnight for possible EGD IV fluid to maintain MAP above 65 Continue to hold off home antihypertensives. Serial H&H every 6 hours x 2 Transfuse hemoglobin less than 7.0. Advised to avoid use of NSAIDs.  Acute blood loss and symptomatic anemia secondary to the above Baseline hemoglobin 15K Presented with hemoglobin of 8.3 K Lightheadedness Serial H&H every 6 hours x 2 Transfuse hemoglobin  less than 7.0.  Hypovolemic hyponatremia Serum sodium 129 Continue IV fluid NS at 75 cc/h x 1 day Repeat BMP in the morning  History of hypertension Hold off home oral antihypertensives Maintain MAP greater than 65 Continue to closely monitor vital signs.  Coronary artery disease Hold aspirin  and Crestor  No reported anginal symptoms.  Hyperlipidemia Hold statin for now  OSA Resume CPAP nightly  Chronic back pain As needed analgesics  Ambulatory dysfunction Uses a cane at baseline Fall precautions.   Critical care time: 55 minutes.   DVT prophylaxis: SCDs.  Code Status: Full code.  Family Communication: None at bedside.  Disposition Plan: Admitted to MedSurg unit, transfer to progressive care unit for close monitoring due to GI bleed and acute blood loss anemia.  Consults called: [facility].  Admission status: Inpatient status.   Status is: Inpatient The patient requires at least 2 midnights for further evaluation and treatment of present condition.   Terry LOISE Hurst MD [facility] Pager [phone]  If 7PM-7AM, please contact night-coverage [email or web address] Password [email or web address]  12/24/2023, 9:18 PM

## 2023-12-25 ENCOUNTER — Inpatient Hospital Stay (HOSPITAL_COMMUNITY): Admitting: Certified Registered Nurse Anesthetist

## 2023-12-25 ENCOUNTER — Encounter (HOSPITAL_COMMUNITY)
Admission: EM | Disposition: A | Discharge status: Home / Self Care | Payer: Self-pay | Source: Home / Self Care | Attending: Emergency Medicine

## 2023-12-25 ENCOUNTER — Telehealth: Payer: Self-pay | Admitting: *Deleted

## 2023-12-25 ENCOUNTER — Encounter (HOSPITAL_COMMUNITY): Payer: Self-pay | Admitting: Gastroenterology

## 2023-12-25 DIAGNOSIS — G4733 Obstructive sleep apnea (adult) (pediatric): Secondary | ICD-10-CM | POA: Diagnosis not present

## 2023-12-25 DIAGNOSIS — K921 Melena: Principal | ICD-10-CM

## 2023-12-25 DIAGNOSIS — I10 Essential (primary) hypertension: Secondary | ICD-10-CM | POA: Diagnosis not present

## 2023-12-25 DIAGNOSIS — D62 Acute posthemorrhagic anemia: Secondary | ICD-10-CM | POA: Diagnosis not present

## 2023-12-25 DIAGNOSIS — K259 Gastric ulcer, unspecified as acute or chronic, without hemorrhage or perforation: Secondary | ICD-10-CM | POA: Diagnosis not present

## 2023-12-25 DIAGNOSIS — I2510 Atherosclerotic heart disease of native coronary artery without angina pectoris: Secondary | ICD-10-CM | POA: Diagnosis not present

## 2023-12-25 DIAGNOSIS — K2960 Other gastritis without bleeding: Secondary | ICD-10-CM | POA: Diagnosis not present

## 2023-12-25 DIAGNOSIS — K254 Chronic or unspecified gastric ulcer with hemorrhage: Secondary | ICD-10-CM

## 2023-12-25 HISTORY — PX: ESOPHAGOGASTRODUODENOSCOPY: SHX5428

## 2023-12-25 LAB — CBC
HCT: 22.2 % — ABNORMAL LOW (ref 39.0–52.0)
HCT: 23.7 % — ABNORMAL LOW (ref 39.0–52.0)
HCT: 23.8 % — ABNORMAL LOW (ref 39.0–52.0)
Hemoglobin: 7.5 g/dL — ABNORMAL LOW (ref 13.0–17.0)
Hemoglobin: 8.1 g/dL — ABNORMAL LOW (ref 13.0–17.0)
Hemoglobin: 8.1 g/dL — ABNORMAL LOW (ref 13.0–17.0)
MCH: 29.2 pg (ref 26.0–34.0)
MCH: 29.7 pg (ref 26.0–34.0)
MCH: 29.7 pg (ref 26.0–34.0)
MCHC: 33.8 g/dL (ref 30.0–36.0)
MCHC: 34.2 g/dL (ref 30.0–36.0)
MCHC: 34 g/dL (ref 30.0–36.0)
MCV: 86.4 fL (ref 80.0–100.0)
MCV: 86.8 fL (ref 80.0–100.0)
MCV: 87.2 fL (ref 80.0–100.0)
Platelets: 220 K/uL (ref 150–400)
Platelets: 244 K/uL (ref 150–400)
Platelets: 247 K/uL (ref 150–400)
RBC: 2.57 MIL/uL — ABNORMAL LOW (ref 4.22–5.81)
RBC: 2.73 MIL/uL — ABNORMAL LOW (ref 4.22–5.81)
RBC: 2.73 MIL/uL — ABNORMAL LOW (ref 4.22–5.81)
RDW: 12.6 % (ref 11.5–15.5)
RDW: 12.7 % (ref 11.5–15.5)
RDW: 12.8 % (ref 11.5–15.5)
WBC: 7.2 K/uL (ref 4.0–10.5)
WBC: 5.4 K/uL (ref 4.0–10.5)
WBC: 6.2 K/uL (ref 4.0–10.5)
nRBC: 0 % (ref 0.0–0.2)
nRBC: 0 % (ref 0.0–0.2)
nRBC: 0 % (ref 0.0–0.2)

## 2023-12-25 LAB — VITAMIN B12: Vitamin B-12: 2955 pg/mL — ABNORMAL HIGH (ref 180–914)

## 2023-12-25 LAB — TYPE AND SCREEN
ABO/RH(D): A POS
Antibody Screen: NEGATIVE

## 2023-12-25 LAB — RETICULOCYTES
Immature Retic Fract: 12.9 % (ref 2.3–15.9)
RBC.: 2.72 MIL/uL — ABNORMAL LOW (ref 4.22–5.81)
Retic Count, Absolute: 68.8 K/uL (ref 19.0–186.0)
Retic Ct Pct: 2.5 % (ref 0.4–3.1)

## 2023-12-25 LAB — IRON AND TIBC
Iron: 39 ug/dL — ABNORMAL LOW (ref 45–182)
Saturation Ratios: 11 % — ABNORMAL LOW (ref 17.9–39.5)
TIBC: 344 ug/dL (ref 250–450)
UIBC: 305 ug/dL

## 2023-12-25 LAB — BASIC METABOLIC PANEL WITH GFR
Anion gap: 7 (ref 5–15)
BUN: 12 mg/dL (ref 8–23)
CO2: 24 mmol/L (ref 22–32)
Calcium: 8.4 mg/dL — ABNORMAL LOW (ref 8.9–10.3)
Chloride: 99 mmol/L (ref 98–111)
Creatinine, Ser: 0.82 mg/dL (ref 0.61–1.24)
GFR, Estimated: >60 mL/min (ref >60)
Glucose, Bld: 90 mg/dL (ref 70–99)
Potassium: 4.1 mmol/L (ref 3.5–5.1)
Sodium: 130 mmol/L — ABNORMAL LOW (ref 135–145)

## 2023-12-25 LAB — LIPASE, BLOOD: Lipase: 26 U/L (ref 11–51)

## 2023-12-25 LAB — FOLATE: Folate: >20 ng/mL (ref >5.9)

## 2023-12-25 LAB — PHOSPHORUS: Phosphorus: 4.1 mg/dL (ref 2.5–4.6)

## 2023-12-25 LAB — FERRITIN: Ferritin: 16 ng/mL — ABNORMAL LOW (ref 24–336)

## 2023-12-25 LAB — MAGNESIUM: Magnesium: 1.8 mg/dL (ref 1.7–2.4)

## 2023-12-25 SURGERY — EGD (ESOPHAGOGASTRODUODENOSCOPY)
Anesthesia: Monitor Anesthesia Care

## 2023-12-25 MED ORDER — PHENYLEPHRINE 80 MCG/ML (10ML) SYRINGE FOR IV PUSH (FOR BLOOD PRESSURE SUPPORT)
PREFILLED_SYRINGE | INTRAVENOUS | Status: DC | PRN
  Administered 2023-12-25: 80 ug via INTRAVENOUS

## 2023-12-25 MED ORDER — HYDROMORPHONE HCL 1 MG/ML IJ SOLN: 0.5000 mg | INTRAMUSCULAR | Status: DC | PRN

## 2023-12-25 MED ORDER — PROPOFOL 10 MG/ML IV BOLUS
INTRAVENOUS | Status: DC | PRN
  Administered 2023-12-25: 60 mg via INTRAVENOUS

## 2023-12-25 MED ORDER — ONDANSETRON HCL 4 MG/2ML IJ SOLN
INTRAMUSCULAR | Status: DC | PRN
  Administered 2023-12-25: 4 mg via INTRAVENOUS

## 2023-12-25 MED ORDER — ESCITALOPRAM OXALATE 20 MG PO TABS
20.0000 mg | ORAL_TABLET | Freq: Every day | ORAL | Status: DC
  Administered 2023-12-25 – 2023-12-26 (×2): 20 mg via ORAL
  Filled 2023-12-25 (×2): qty 1

## 2023-12-25 MED ORDER — OXYCODONE HCL 5 MG PO TABS
10.0000 mg | ORAL_TABLET | ORAL | Status: DC | PRN
  Administered 2023-12-25: 10 mg via ORAL
  Filled 2023-12-25: qty 2

## 2023-12-25 MED ORDER — OXYCODONE HCL 5 MG PO TABS
10.0000 mg | ORAL_TABLET | Freq: Four times a day (QID) | ORAL | Status: DC | PRN
  Administered 2023-12-25: 10 mg via ORAL
  Filled 2023-12-25: qty 2

## 2023-12-25 MED ORDER — ROSUVASTATIN CALCIUM 20 MG PO TABS
20.0000 mg | ORAL_TABLET | Freq: Every day | ORAL | Status: DC
Start: 2023-12-25 — End: 2023-12-26
  Administered 2023-12-25 – 2023-12-26 (×2): 20 mg via ORAL
  Filled 2023-12-25 (×2): qty 1

## 2023-12-25 MED ORDER — HYDROMORPHONE HCL 1 MG/ML IJ SOLN
0.5000 mg | INTRAMUSCULAR | Status: DC | PRN
  Administered 2023-12-25 – 2023-12-26 (×5): 0.5 mg via INTRAVENOUS
  Filled 2023-12-25 (×5): qty 0.5

## 2023-12-25 MED ORDER — SODIUM CHLORIDE 0.9 % IV SOLN: INTRAVENOUS | Status: DC | PRN

## 2023-12-25 MED ORDER — PROPOFOL 500 MG/50ML IV EMUL
INTRAVENOUS | Status: DC | PRN
  Administered 2023-12-25: 150 ug/kg/min via INTRAVENOUS

## 2023-12-25 MED ORDER — FERROUS SULFATE 325 (65 FE) MG PO TABS
325.0000 mg | ORAL_TABLET | Freq: Every day | ORAL | Status: DC
  Administered 2023-12-26: 325 mg via ORAL
  Filled 2023-12-25: qty 1

## 2023-12-25 MED ORDER — GABAPENTIN 300 MG PO CAPS
300.0000 mg | ORAL_CAPSULE | Freq: Three times a day (TID) | ORAL | Status: DC
  Administered 2023-12-25 – 2023-12-26 (×3): 300 mg via ORAL
  Filled 2023-12-25 (×3): qty 1

## 2023-12-25 MED ORDER — LIDOCAINE 2% (20 MG/ML) 5 ML SYRINGE
INTRAMUSCULAR | Status: DC | PRN
  Administered 2023-12-25: 80 mg via INTRAVENOUS

## 2023-12-25 NOTE — Consult Note (Addendum)
 Consultation Note   Referring Provider:   [facility] PCP: Lazoff, Shawn P, DO Primary Gastroenterologist:   Kavitha Nandigam, MD      Reason for Consultation: GI bleed DOA: 12/24/2023         Hospital Day: 2   ASSESSMENT    73 year old male with history of gastric ulcers (non-H. pylori related) admitted with upper GI bleed with melena and acute blood loss anemia on Eliquis ( reason?).  Presenting hemoglobin 8.3 (down from baseline of 15). Last dose of Eliquis was two days ago  GERD Tells me he does not take anything for acid reflux at home yet daily Nexium  is listed on his home med list?  ?  Remote history of ulcerative colitis (per PMH).   No evidence of this on prior colonoscopies  CAD on aspirin   Hyperlipidemia  Hypertension  OSA on CPAP  Obesity On Mounjaro  Chronic pain requiring chronic opioids  See PMH for any additional medical history  / medical problems  Principal Problem:   GI bleed    PLAN:   --Keep n.p.o. --Schedule for EGD today. The risks and benefits of EGD with possible biopsies were discussed with the patient who agrees to proceed. --BID IV Pantoprazole  --Monitor H/H, transfuse as needed   HPI   Brief History:  Patient has a history of GI bleeding secondary to gastric ulcers in 2023.  He had a follow-up EGD in 2023 with documented healing of gastric ulcer.  Etiology of gastric ulcer at that time on clear.  H. pylori negative on biopsies.   Interval history Patient presented to [facility] ED yesterday with dizziness and melena .  He had a couple of black bowel movements 2 days and again yesterday.  No associated nausea  /vomiting, nor abdominal pain.  He takes a daily baby aspirin , no other NSAIDs.  He takes Eliquis, last dose was 2 days ago.  In ED hemoglobin found to be down several grams from baseline                                                Pertinent Studies :  In the ED  patient was hemodynamically stable.  Hemoglobin was 8.3 (down from baseline of 15 in January 2024), MCV 87.  Sodium 129, BUN 25, creatinine 0.8, LFTs normal.  FOBT positive  CTA No active extravasation of contrast to suggest active GI bleeding.  Patchy ground glass opacities in the RLL lung  Interval History :  Overnight hemoglobin has declined slightly to 7.5 with IV fluids.  No black stools today.  No shortness of breath, he does endorse some mild dull mid chest discomfort this a.m.  Chest discomfort is nonradiating.  It is nonexertional.     Pertinent GI Studies   For follow-up on gastric ulcer - H. pylori negative -- Mild, nonspecific distal gastritis.  -- The previously noted distal gastric ulcer had healed -- Examination otherwise normal  April 2023 EGD  January 2023 EGD and colonoscopy for iron  deficiency anemia and history of colon polyps  EGD Normal esophagus, biopsy distally --1 nonbleeding linear gastric  ulcer with no stigmata of bleeding was found in the prepyloric region of the stomach.  The lesion was 6 mm in diameter.  Biopsies taken.  Small hiatal hernia. --Exam otherwise without abnormality. --Normal esophagus  Colonoscopy -- Good bowel prep -- Left-sided diverticulosis -- Exam otherwise normal  Diagnosis 1. Surgical [P], gastric antrum ulcer - REACTIVE GASTROPATHY WITH MILD ACTIVE INFLAMMATION. - HELICOBACTER PYLORI IMMUNOHISTOCHEMICAL NEGATIVE. - NEGATIVE FOR INTESTINAL METAPLASIA AND MALIGNANCY. 2. Surgical [P], normal distal esophagus - SQUAMOUS MUCOSA WITH NO SPECIFIC PATHOLOGIC DIAGNOSIS. - NEGATIVE FOR DYSPLASIA AND MALIGNANCY. - NEGATIVE FOR ACUTE ESOPHAGITIS AND INTRA-SQUAMOUS EOSINOPHILS. - NO VIRAL CYTOPATHIC CHANGE OR FUNGI IDENTIFIED (ON H&E). - NO GLANDULAR MUCOSA PRESENT. 3. Surgical [P], normal proximal esophagus - SQUAMOUS MUCOSA WITH NO SPECIFIC PATHOLOGIC DIAGNOSIS. - NEGATIVE FOR ACUTE ESOPHAGITIS AND INTRA-SQUAMOUS EOSINOPHILS. -  NEGATIVE FOR DYSPLASIA AND MALIGNANCY. - NO VIRAL CYTOPATHIC CHANGE OR FUNGI IDENTIFIED (ON H&E). - NO GLANDULAR MUCOSA PRESENT.  Labs and Imaging:  Recent Labs    12/24/23 1134  PROT 6.0*  ALBUMIN 3.9  AST 26  ALT 30  ALKPHOS 50  BILITOT 0.3   Recent Labs    12/24/23 1134 12/24/23 2134 12/25/23 0327  WBC 7.8  --  7.2  HGB 8.3* 7.9* 7.5*  HCT 24.5* 23.2* 22.2*  MCV 86.9  --  86.4  PLT 249  --  220   Recent Labs    12/24/23 1134 12/25/23 0327  NA 129* 130*  K 4.1 4.1  CL 97* 99  CO2 23 24  GLUCOSE 112* 90  BUN 25* 12  CREATININE 0.80 0.82  CALCIUM  9.4 8.4*     CT ANGIO GI BLEED CLINICAL DATA:  GI bleed.  EXAM: CTA ABDOMEN AND PELVIS WITHOUT AND WITH CONTRAST  TECHNIQUE: Multidetector CT imaging of the abdomen and pelvis was performed using the standard protocol during bolus administration of intravenous contrast. Multiplanar reconstructed images and MIPs were obtained and reviewed to evaluate the vascular anatomy.  RADIATION DOSE REDUCTION: This exam was performed according to the departmental dose-optimization program which includes automated exposure control, adjustment of the mA and/or kV according to patient size and/or use of iterative reconstruction technique.  CONTRAST:  OMNIPAQUE  IOHEXOL  350 MG/ML SOLN  COMPARISON:  CT scan abdomen and pelvis from 04/04/2021.  FINDINGS: VASCULAR  Aorta: Normal caliber aorta without aneurysm, dissection, vasculitis or significant stenosis. Mild-to-moderate peripheral atherosclerotic vascular calcifications noted.  Celiac: Mild-to-moderate narrowing at the origin due to calcified plaques. Otherwise, patent without evidence of aneurysm, dissection or vasculitis.  SMA: Patent without evidence of aneurysm, dissection, vasculitis or significant stenosis.  Renals: Both renal arteries are patent without evidence of aneurysm, dissection, vasculitis, fibromuscular dysplasia or  significant stenosis.  IMA: Patent without evidence of aneurysm, dissection, vasculitis or significant stenosis.  Inflow: Patent without evidence of aneurysm, dissection, vasculitis or significant stenosis.  Proximal Outflow: Bilateral common femoral and visualized portions of the superficial and profunda femoral arteries are patent without evidence of aneurysm, dissection, vasculitis or significant stenosis.  Veins: No obvious venous abnormality within the limitations of this arterial phase study.  Review of the MIP images confirms the above findings.  NON-VASCULAR  Lower chest: There are patchy ground-glass opacities in the right lung lower lobe which are new since the prior study. These are nonspecific and may represent sequela of infection or inflammation. Follow-up examination is recommended in 3 months to document stability versus resolution. Bilateral lungs are otherwise clear. No consolidation, collapse, lung mass or pleural effusion. The  heart is normal in size. No pericardial effusion.  Hepatobiliary: The liver is normal in size. Non-cirrhotic configuration. No suspicious mass. No intrahepatic or extrahepatic bile duct dilation. No calcified gallstones. Normal gallbladder wall thickness. No pericholecystic inflammatory changes.  Pancreas: Unremarkable. No pancreatic ductal dilatation or surrounding inflammatory changes.  Spleen: Within normal limits. No focal lesion.  Adrenals/Urinary Tract: Adrenal glands are unremarkable. No suspicious renal mass. There are at least 2 simple cysts in the right kidney with largest in the lower pole measuring up to 1.6 x 1.7 cm. No suspicious renal mass seen. No nephroureterolithiasis or obstructive uropathy. Unremarkable urinary bladder.  Stomach/Bowel: No disproportionate dilation of the small or large bowel loops. No evidence of abnormal bowel wall thickening or inflammatory changes. The appendix was not visualized; however  there is no acute inflammatory process in the right lower quadrant. There are multiple scattered colonic diverticula without diverticulitis.  Vascular/Lymphatic: No ascites or pneumoperitoneum. No abdominal or pelvic lymphadenopathy, by size criteria.  Reproductive: Normal size prostate. Probable post TURP defect noted. Symmetric seminal vesicles.  Other: The visualized soft tissues and abdominal wall are unremarkable.  Musculoskeletal: No suspicious osseous lesions. There are mild multilevel degenerative changes in the visualized spine. Coflex interspinous distraction device noted at L3-4 and L4-5 spinal processes level.  IMPRESSION: 1. No acute inflammatory process identified within the abdomen or pelvis. 2. No active extravasation of contrast noted to suggest active GI bleeding. 3. There are patchy ground-glass opacities in the right lung lower lobe, which are nonspecific and may represent sequela of infection or inflammation. Follow-up examination is recommended in 3 months to document stability versus resolution. Bilateral lungs are otherwise clear. 4. Multiple other nonacute observations, as described above.  Aortic Atherosclerosis (ICD10-I70.0).  Electronically Signed   By: Ree Molt M.D.   On: 12/24/2023 14:43    Past Medical History:  Diagnosis Date   Allergy    allergic rhinitis   Anxiety    Arthritis    R knee, hands , back oa   BPH (benign prostatic hyperplasia)    Cancer (HCC)    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 resolved as of 01-27-2020   History of melanoma 15 yrs ago   face and forehead-- previously followed by [facility] in [place]    History of migraine    none recent as of 01-27-2020   History of ulcerative colitis    non  recent flares   Hyperlipidemia    Hypertension    Pseudocholinesterase deficiency    Skin cancer    Sleep apnea    last sleep study 10 yrs ago,CPAP-  no current cpap use     Past Surgical History:  Procedure Laterality Date   acl and mensicus repair reconstruct Right 1980's   ANTERIOR CERVICAL DECOMP/DISCECTOMY FUSION  10/15/2011   Procedure: ANTERIOR CERVICAL DECOMPRESSION/DISCECTOMY FUSION 1 LEVEL;  Surgeon: Victory Gens, MD;  Location: [facility];  Service: Neurosurgery;  Laterality: N/A;  Cervical four-five Anterior cervical decompression/diskectomy/fusion   APPENDECTOMY  1969   COLONOSCOPY  last done 2018   ELBOW SURGERY Right 2019   tendon replacement   HERNIA REPAIR     inguinal / abdominal - repaired as an infant    JOINT REPLACEMENT     KNEE ARTHROSCOPY Left  last done oct 2013   x 3   KNEE SURGERY  yrs ago   right knee   LEFT HEART CATH AND CORONARY ANGIOGRAPHY N/A 11/14/2017   Procedure: LEFT HEART CATH AND CORONARY ANGIOGRAPHY;  Surgeon: Dann Candyce RAMAN, MD;  Location: [facility];  Service: Cardiovascular;  Laterality: N/A;   LUMBAR LAMINECTOMY WITH COFLEX 2 LEVEL N/A 09/12/2015   Procedure: L3-4 L4-5 Laminectomy with coflex;  Surgeon: Victory Gens, MD;  Location: [facility];  Service: Neurosurgery;  Laterality: N/A;  L3-4 L4-5 Laminectomy with coflex   NASAL SINUS SURGERY     multiple sinus surgery - 1980's    SKIN CANCER EXCISION  2023   TONSILLECTOMY  1969   TOTAL KNEE ARTHROPLASTY Left 10/22/2012   Procedure: TOTAL KNEE ARTHROPLASTY;  Surgeon: Reyes JAYSON Billing, MD;  Location: [facility];  Service: Orthopedics;  Laterality: Left;   TOTAL KNEE REVISION WITH SCAR DEBRIDEMENT/PATELLA REVISION WITH POLY EXCHANGE Left 05/18/2020   Procedure: Left knee excision saphenous nerve branch, neurectomy, left open scar excision with polyethlene revision;  Surgeon: Ernie Cough, MD;  Location: [facility];  Service: Orthopedics;  Laterality: Left;  90 mins Spinal with  adductor block   TRANSURETHRAL RESECTION OF PROSTATE N/A 01/31/2020   Procedure: TRANSURETHRAL RESECTION OF THE PROSTATE (TURP);  Surgeon: Matilda Senior, MD;  Location: [facility];  Service: Urology;  Laterality: N/A;  76 MIS   UPPER GASTROINTESTINAL ENDOSCOPY      Family History  Problem Relation Age of Onset   Ovarian cancer Mother    Diabetes Father    Cancer Brother        brain tumor   Colon cancer Neg Hx    Stomach cancer Neg Hx    Rectal cancer Neg Hx    Esophageal cancer Neg Hx    Liver cancer Neg Hx    Colon polyps Neg Hx     Prior to Admission medications   Medication Sig Start Date End Date Taking? Authorizing Provider  amLODipine  (NORVASC ) 10 MG tablet TAKE 1 TABLET BY MOUTH EVERY DAY 11/25/23  Yes [name], MD  aspirin  EC 81 MG tablet Take 81 mg by mouth daily. 11/26/19  Yes [provider]  Bioflavonoid Products (VITAMIN C) CHEW Chew 1 tablet by mouth daily.   Yes [provider]  cetirizine (ZYRTEC) 10 MG tablet Take 10 mg by mouth daily as needed for allergies.   Yes [provider]  Cholecalciferol (VITAMIN D-3 PO) Take 1 each by mouth daily. OTC vitamin D3 gummy.   Yes [provider]  Coenzyme Q10 (COQ-10 PO) Take 1 capsule by mouth daily.   Yes [provider]  Cyanocobalamin  (B-12 PO) Take 1 each by mouth daily. OTC vitamin B12 energy complex gummy   Yes [provider]  escitalopram (LEXAPRO) 20 MG tablet Take 20 mg by mouth daily. 12/11/20  Yes [provider]  esomeprazole  (NEXIUM ) 40 MG capsule TAKE 1 CAPSULE (40 MG TOTAL) BY MOUTH DAILY. 05/03/22  Yes [name], MD  fluticasone  (FLONASE ) 50 MCG/ACT nasal spray Place 2 sprays into both nostrils daily. Patient taking differently: Place 1-2 sprays into both nostrils daily as needed for allergies. 05/21/23  Yes [name], [name]  gabapentin  (NEURONTIN ) 300 MG capsule Take 300 mg by mouth in the morning, at noon,  and at bedtime.   Yes [provider]  irbesartan  (AVAPRO ) 150 MG tablet Take 1 tablet (150 mg total) by mouth daily. 11/25/22  Yes [name], [name], MD  MELATONIN PO Take 1-2 tablets by mouth at bedtime as needed (sleep).   Yes [provider]  MOUNJARO 2.5 MG/0.5ML Pen Inject 2.5 mg into the skin every Friday.   Yes [provider]  oxyCODONE -acetaminophen  (PERCOCET) 10-325 MG tablet Take 1 tablet by mouth 4 (four) times daily as needed for pain.   Yes [provider]  POTASSIUM PO Take 1 each by mouth daily. OTC potassium gummy   Yes [provider]  rosuvastatin  (CRESTOR ) 20 MG tablet TAKE 1 TABLET BY MOUTH EVERY DAY 11/25/23  Yes [name], MD  spironolactone (ALDACTONE) 25 MG tablet Take 25 mg by mouth daily. 12/03/23  Yes [provider]  traZODone (DESYREL) 50 MG tablet Take 25 mg by mouth at bedtime as needed for sleep. 10/30/23  Yes [provider]  carvedilol  (COREG ) 12.5 MG tablet TAKE 1 TABLET BY MOUTH TWICE A DAY Patient not taking: Reported on 12/25/2023 10/07/23   [name], MD    Current Facility-Administered Medications  Medication Dose Route Frequency Provider Last Rate Last Admin   0.9 %  sodium chloride  infusion   Intravenous Continuous Shona Terry SAILOR, DO 75 mL/hr at 12/24/23 2153 New Bag at 12/24/23 2153   acetaminophen  (TYLENOL ) tablet 500 mg  500 mg Oral Q6H PRN Shona Terry SAILOR, DO       HYDROmorphone  (DILAUDID ) injection 0.5 mg  0.5 mg Intravenous Q4H PRN Paterson, Robert C, MD   0.5 mg at 12/25/23 0848   melatonin tablet 5 mg  5 mg Oral QHS PRN Shona Terry SAILOR, DO       ondansetron  (ZOFRAN ) injection 4 mg  4 mg Intravenous Q8H PRN Paterson, Robert C, MD   4 mg at 12/24/23 1921   oxyCODONE  (Oxy IR/ROXICODONE ) immediate release tablet 10 mg  10 mg Oral Q6H PRN Hall, Carole N, DO   10 mg at 12/25/23 9781   pantoprazole  (PROTONIX ) injection 40 mg  40 mg Intravenous BID Shona Terry SAILOR, DO   40 mg at 12/25/23 0848    polyethylene glycol (MIRALAX  / GLYCOLAX ) packet 17 g  17 g Oral Daily PRN Shona Terry SAILOR, DO        Allergies as of 12/24/2023 - Review Complete 12/24/2023  Allergen Reaction Noted   Heparin  Anaphylaxis    Succinylcholine chloride Other (See Comments)    Vancomycin  Hives and Other (See Comments) 10/15/2011   Penicillins Rash     Social History   Socioeconomic History   Marital status: Widowed    Spouse name: Not on file   Number of children: 3   Years of education: Not on file   Highest education level: Not on file  Occupational History   Occupation: retired  Tobacco Use   Smoking status: Never   Smokeless tobacco: Former    Types: Chew    Quit date: 02/26/2003  Vaping Use   Vaping status: Never Used  Substance and Sexual Activity   Alcohol use: No    Comment: occasional beer   Drug use: No   Sexual activity: Not on file  Other Topics Concern   Not on file  Social History Narrative   Wife works at cath lab at [employer] advanced breast cancer   Lives with wife in a 2 story home.  Retired.  Education: college.    Social Drivers of Health   Financial Resource Strain: Low Risk  (10/04/2019)   Overall Financial Resource Strain (CARDIA)  Difficulty of Paying Living Expenses: Not hard at all  Food Insecurity: No Food Insecurity (12/24/2023)   Hunger Vital Sign    Worried About Running Out of Food in the Last Year: Never true    Ran Out of Food in the Last Year: Never true  Transportation Needs: No Transportation Needs (12/24/2023)   PRAPARE - Administrator, Civil Service (Medical): No    Lack of Transportation (Non-Medical): No  Physical Activity: Sufficiently Active (10/04/2019)   Exercise Vital Sign    Days of Exercise per Week: 7 days    Minutes of Exercise per Session: 40 min  Stress: No Stress Concern Present (04/12/2023)   Received from [facility] of Occupational Health - Occupational Stress Questionnaire    Feeling of Stress :  Not at all  Social Connections: Moderately Integrated (12/24/2023)   Social Connection and Isolation Panel    Frequency of Communication with Friends and Family: More than three times a week    Frequency of Social Gatherings with Friends and Family: More than three times a week    Attends Religious Services: More than 4 times per year    Active Member of [employer] or Organizations: Yes    Attends Banker Meetings: More than 4 times per year    Marital Status: Widowed  Intimate Partner Violence: Not At Risk (12/24/2023)   Humiliation, Afraid, Rape, and Kick questionnaire    Fear of Current or Ex-Partner: No    Emotionally Abused: No    Physically Abused: No    Sexually Abused: No     Code Status   Code Status: Full Code  Review of Systems: All systems reviewed and negative except where noted in HPI.  Physical Exam: Vital signs in last 24 hours: Temp:  [97.6 F (36.4 C)-98.9 F (37.2 C)] 98.5 F (36.9 C) (10/30 0822) Pulse Rate:  [71-86] 71 (10/30 0822) Resp:  [10-18] 16 (10/30 0822) BP: (111-142)/(57-82) 120/68 (10/30 0822) SpO2:  [96 %-100 %] 98 % (10/30 9177) Weight:  [78.9 kg] 78.9 kg (10/29 1104) Last BM Date : 12/24/23  General:  Pleasant male in NAD Psych:  Cooperative. Normal mood and affect Eyes: Pupils equal Ears:  Normal auditory acuity Nose: No deformity, discharge or lesions Neck:  Supple, no masses felt Lungs:  Clear to auscultation.  Heart:  Regular rate, regular rhythm.  Abdomen:  Soft, nondistended, nontender, active bowel sounds, no masses felt Rectal :  Deferred Msk: Symmetrical without gross deformities.  Neurologic:  Alert, oriented, grossly normal neurologically Extremities : No edema Skin:  Intact without significant lesions.    Intake/Output from previous day: 10/29 0701 - 10/30 0700 In: 908 [I.V.:408; IV Piggyback:500] Out: 500 [Urine:500] Intake/Output this shift:  No intake/output data recorded.   Vina Dasen, NP-C    12/25/2023, 9:21 AM  -----------------------------------------------------------------  I have taken a history, reviewed the chart and examined the patient. I performed a substantive portion of this encounter, including complete performance of at least one of the key components, in conjunction with the APP. I agree with the APP's note, impression and recommendations with the exception that the patient is NOT on Eliquis.  He reports he was confused about that.  73 year old male with history of CAD, DM, OA and small linear gastric ulcer noted on EGD in 2023 (to evaluate IDA/dyspepsia) with subsequent repeat EGD that confirmed resolution of ulcer, who presented with 2 days of black stools with associated light-headedness/fatigue and mild  epigastric pain, found to have hgb 8 (MCV 87).  Hgb 11.7 on Dec 02, 2023 and was 13.7 in Feb 2025.  Hgb drifted to 7.5 this morning.  Normal iron  panel in April 2024.  Had mild IDA in 2023 prompting EGD and colonoscopy Last BM yesterday morning.  No nausea/vomiting or hematemesis.  No prior overt bleeding per patient. Takes ASA 81 mg but otherwise denies any NSAIDs. Hemodynamically stable.   Clinical presentation most consistent with acute upper GI Bleed, etiology unclear.  Plan for EGD today.  The details, risks (including bleeding, perforation, infection, missed lesions, medication reactions and possible hospitalization or surgery if complications occur), benefits, and alternatives to EGD with possible biopsy and possible dilation were discussed with the patient and he consents to proceed.    Rhyanna Sorce E. Stacia, MD [facility]   Moderate complex medical decision making (this includes chart review, review of results, face-to-face time used for counseling as well as treatment plan and follow-up. The patient was provided an opportunity to ask questions and all were answered. The patient agreed with the plan and demonstrated an understanding of the  instructions

## 2023-12-25 NOTE — Progress Notes (Signed)
 [facility] Progress Note Patient: Dylan [name] FMW:[id number] DOB: Jul 19, 1950  DOA: 12/24/2023 DOS: the patient was seen and examined on 12/25/2023  Brief Hospital Course: PMH of HTN, mood disorder, GERD, neuropathy, HLD, CAD nonobstructive, BPH, T2DM presented to the hospital with complaints of melena, abdominal pain and lightheadedness. Found to have symptomatic anemia with hemoglobin dropping from 13-15 to 8.3. GI consulted.  Hemodynamically stable.  CT angio GI bleed negative for any extravasation of the contrast. Scheduled for EGD on 10/30.  Assessment and Plan: Upper GI bleed.  [facility] consulted. Prior history of gastric ulcers gastritis. Hemoccult positive.. H&H relatively stable. Prior history of diclofenac use for arthritis as well as ibuprofen use.  Also on 81 mg aspirin . Continue PPI twice daily. Follow-up on EGD.  [facility] consulting. CT angio abdomen negative for any other acute intra-abdominal pathology.  Incidental right lower lobe ground glass opacity. No respiratory symptoms. Recommend repeat CT/chest x-ray in 3 months for resolution. Currently no indication to initiate antibiotic therapy.  Acute blood loss symptomatic anemia. Transfuse hemoglobin less than 7. Monitor on telemetry.  Hyponatremia. Etiology not clear. Received IV fluid. Sodium improving. Asymptomatic.  HTN. Holding home blood pressure medications for now.  History of CAD. Nonobstructive. On aspirin .  Hold for now.  HLD. Crestor . Continue.  OSA. CPAP nightly. Continue.  Chronic arthritis. Takes diclofenac. For now monitor.  Addendum: Underwent EGD.  Showed gastric ulcer.  No bleeding stigmata.  GI recommending to stop aspirin  for at least 1 week.  Patient currently does not have any significant indication to continue aspirin  going forward.  Also recommend to stop NSAIDs. GI recommended to observe overnight for stability. Lipase normal. Folate normal.  B12  normal. Iron  level 39. Reticulocyte count normal. Yetta Blanch 4:52 PM 12/25/2023    Subjective: Denies any acute complaint.  No nausea no vomiting.  Physical Exam: Clear to auscultation. S1-S2 present Bowel sound present  no edema. Epigastric tenderness present.  Data Reviewed: I have Reviewed nursing notes, Vitals, and Lab results. Since last encounter, pertinent lab results CBC and BMP   . I have ordered test including CBC and BMP  . I have discussed pt's care plan and test results with GI  .   Disposition: Status is: Inpatient Remains inpatient appropriate because: Observe overnight for EGD.  SCDs Start: 12/24/23 2116   Family Communication: Discussed with daughter at bedside Level of care: Progressive   Vitals:   12/24/23 1924 12/24/23 2031 12/25/23 0457 12/25/23 0634  BP:  132/68 115/76 117/64  Pulse:  72 76 74  Resp:  18 18 10   Temp: 98.4 F (36.9 C) 98.1 F (36.7 C) 98.9 F (37.2 C) 98.8 F (37.1 C)  TempSrc: Oral Oral Oral Oral  SpO2:  100% 100% 99%  Weight:         Author: Yetta Blanch, MD 12/25/2023 7:42 AM  Please look on [email or web address] to find out who is on call.

## 2023-12-25 NOTE — Care Management CC44 (Signed)
 Condition Code 44 Documentation Completed  Patient Details  Name: CARTHEL CASTILLE MRN: [id number] Date of Birth: 26-Dec-1950   Condition Code 44 given:  Yes Patient signature on Condition Code 44 notice:  Yes Documentation of 2 MD's agreement:  Yes Code 44 added to claim:  Yes    Tom-Johnson, [name], RN 12/25/2023, 4:50 PM

## 2023-12-25 NOTE — Transfer of Care (Signed)
 Immediate Anesthesia Transfer of Care Note  Patient: Dylan [name]  Procedure(s) Performed: EGD (ESOPHAGOGASTRODUODENOSCOPY)  Patient Location: PACU  Anesthesia Type:MAC  Level of Consciousness: awake and alert   Airway & Oxygen Therapy: Patient Spontanous Breathing and Patient connected to nasal cannula oxygen  Post-op Assessment: Report given to RN and Post -op Vital signs reviewed and stable  Post vital signs: Reviewed and stable  Last Vitals:  Vitals Value Taken Time  BP 113/61 12/25/23 13:10  Temp    Pulse 77 12/25/23 13:12  Resp 18 12/25/23 13:12  SpO2 100 % 12/25/23 13:12  Vitals shown include unfiled device data.  Last Pain:  Vitals:   12/25/23 1216  TempSrc: Temporal  PainSc: 0-No pain      Patients Stated Pain Goal: 0 (12/25/23 0515)  Complications: No notable events documented.

## 2023-12-25 NOTE — Care Management Obs Status (Signed)
 MEDICARE OBSERVATION STATUS NOTIFICATION   Patient Details  Name: Dylan [name] MRN: [id number] Date of Birth: 31-Dec-1950   Medicare Observation Status Notification Given:  Yes    Tom-Johnson, [name], RN 12/25/2023, 4:50 PM

## 2023-12-25 NOTE — Anesthesia Preprocedure Evaluation (Signed)
 Anesthesia Evaluation  Patient identified by MRN, date of birth, ID band Patient awake    Reviewed: Allergy & Precautions, NPO status , Patient's Chart, lab work & pertinent test results, reviewed documented beta blocker date and time   History of Anesthesia Complications (+) PSEUDOCHOLINESTERASE DEFICIENCY and history of anesthetic complications  Airway Mallampati: II  TM Distance: >3 FB     Dental no notable dental hx.    Pulmonary sleep apnea and Continuous Positive Airway Pressure Ventilation , neg COPD   breath sounds clear to auscultation       Cardiovascular hypertension, (-) angina + CAD  (-) Past MI and (-) Cardiac Stents  Rhythm:Regular Rate:Normal     Neuro/Psych neg Seizures  Anxiety      Neuromuscular disease    GI/Hepatic ,GERD  Medicated and Controlled,,(+) neg Cirrhosis        Endo/Other    Renal/GU Renal disease     Musculoskeletal  (+) Arthritis ,    Abdominal   Peds  Hematology  (+) Blood dyscrasia, anemia   Anesthesia Other Findings   Reproductive/Obstetrics                              Anesthesia Physical Anesthesia Plan  ASA: 3  Anesthesia Plan: MAC   Post-op Pain Management:    Induction: Intravenous  PONV Risk Score and Plan: 2 and Ondansetron  and Propofol  infusion  Airway Management Planned: Natural Airway and Nasal Cannula  Additional Equipment:   Intra-op Plan:   Post-operative Plan:   Informed Consent: I have reviewed the patients History and Physical, chart, labs and discussed the procedure including the risks, benefits and alternatives for the proposed anesthesia with the patient or authorized representative who has indicated his/her understanding and acceptance.     Dental advisory given  Plan Discussed with: CRNA  Anesthesia Plan Comments:          Anesthesia Quick Evaluation

## 2023-12-25 NOTE — Op Note (Signed)
 [facility] Patient Name: Dylan [name] Procedure Date : 12/25/2023 MRN: [id number] Attending MD: Glendia BRAVO. Stacia , MD, [id number] Date of Birth: 22-Feb-1951 CSN: [id number] Age: 73 Admit Type: Outpatient Procedure:                Upper GI endoscopy Indications:              Melena, acute blood loss anemia Providers:                Glendia E. Stacia, MD, Darleene Bare, RN, Haskel Chris, Technician Referring MD:              Medicines:                Monitored Anesthesia Care Complications:            No immediate complications. Estimated Blood Loss:     Estimated blood loss was minimal. Procedure:                Pre-Anesthesia Assessment:                           - Prior to the procedure, a History and Physical                            was performed, and patient medications and                            allergies were reviewed. The patient's tolerance of                            previous anesthesia was also reviewed. The risks                            and benefits of the procedure and the sedation                            options and risks were discussed with the patient.                            All questions were answered, and informed consent                            was obtained. Prior Anticoagulants: The patient has                            taken no anticoagulant or antiplatelet agents                            except for aspirin . ASA Grade Assessment: II - A                            patient with mild systemic disease. After reviewing  the risks and benefits, the patient was deemed in                            satisfactory condition to undergo the procedure.                           After obtaining informed consent, the endoscope was                            passed under direct vision. Throughout the                            procedure, the patient's blood pressure, pulse, and                             oxygen saturations were monitored continuously. The                            [id number]-H190 ([id number]) Olympus endoscope was introduced                            through the mouth, and advanced to the second part                            of duodenum. The upper GI endoscopy was                            accomplished without difficulty. The patient                            tolerated the procedure well. Scope In: Scope Out: Findings:      The examined esophagus was normal.      One non-bleeding cratered gastric ulcer with no stigmata of bleeding was       found in the prepyloric region of the stomach. The lesion was 7 mm in       largest dimension. Biopsies were taken with a cold forceps for       Helicobacter pylori testing. Estimated blood loss was minimal.      The exam of the stomach was otherwise normal.      Biopsies were taken with a cold forceps in the gastric antrum for       Helicobacter pylori testing. Estimated blood loss was minimal.      The examined duodenum was normal. Impression:               - Normal esophagus.                           - Non-bleeding gastric ulcer with no stigmata of                            bleeding. Biopsied.                           - Normal examined duodenum.                           -  Biopsies were taken with a cold forceps for                            Helicobacter pylori testing.                           - Suspect ulcer is secondary to aspirin . Moderate Sedation:      N/A Recommendation:           - Return patient to hospital ward for ongoing care.                           - Resume regular diet.                           - Use Nexium  (esomeprazole ) 40 mg PO BID for 8                            weeks to allow ulcer to heal.                           - Would avoid aspirin  for the next week at least.                            Reconsider risks/benefits of aspirin  with                            PCP/cardiology.                            - If ongoing aspirin  therapy warranted (likely                            given history of CAD), would recommend indefinite                            PPI therapy to reduce risk of recurrent ulcer                           - Repeat EGD in 8-12 weeks to assess for resolution                            of ulcer.                           - If no evidence of bleeding overnight, ok for                            discharge home tomorrow.                           - GI will sign off at this time Procedure Code(s):        --- Professional ---                           [phone],  Esophagogastroduodenoscopy, flexible,                            transoral; with biopsy, single or multiple Diagnosis Code(s):        --- Professional ---                           K25.9, Gastric ulcer, unspecified as acute or                            chronic, without hemorrhage or perforation                           K92.1, Melena (includes Hematochezia) CPT copyright 2022 [employer]. All rights reserved. The codes documented in this report are preliminary and upon coder review may  be revised to meet current compliance requirements. Ellaina Schuler E. Stacia, MD 12/25/2023 1:22:35 PM This report has been signed electronically. Number of Addenda: 0

## 2023-12-25 NOTE — Plan of Care (Signed)
   Problem: Activity: Goal: Risk for activity intolerance will decrease Outcome: Progressing   Problem: Nutrition: Goal: Adequate nutrition will be maintained Outcome: Progressing   Problem: Pain Managment: Goal: General experience of comfort will improve and/or be controlled Outcome: Progressing

## 2023-12-25 NOTE — Telephone Encounter (Signed)
-----   Message from Glendia FORBES Holt sent at 12/25/2023  1:24 PM EDT ----- Regarding: Repeat EGD to assess ulcer healing Team,  Please help get this patient set up for a repeat EGD in 8-12 weeks to assess ulcer healing.  Admitted with melena, found to have gastric ulcer.   Should be discharged tomorrow  [facility].  Thanks

## 2023-12-25 NOTE — TOC CM/SW Note (Signed)
 Transition of Care [facility]) - Inpatient Brief Assessment   Patient Details  Name: Dylan [name] MRN: [id number] Date of Birth: [birth year]  [facility] CM/SW Contact:    Tom-Johnson, [name], RN Phone Number: 12/25/2023, 4:40 PM   Clinical Narrative:  Patient presented to the ED with Generalized Weakness, Lightheadedness, Abdominal pain, black tarry stool. Admitted with GI Bleed. Hgb on admit was 8.3, today at 8.1. Patient underwent upper Endoscopy today 12/25/23. GI following.     Lives with daughter Newell, has three supportive children. Modified independent, has a cane, walker, shower seat, rails.  PCP is Lazoff, Shawn P, DO and uses [facility] in [place].  No ICM needs or recommendations noted at this time.  Patient not Medically ready for discharge.  CM will continue to follow as patient progresses with care towards discharge.               Transition of Care Asessment: Insurance and Status: Insurance coverage has been reviewed Patient has primary care physician: Yes Home environment has been reviewed: Yes Prior level of function:: Modified Independent Prior/Current Home Services: No current home services Social Drivers of Health Review: SDOH reviewed no interventions necessary Readmission risk has been reviewed: Yes Transition of care needs: no transition of care needs at this time

## 2023-12-26 ENCOUNTER — Other Ambulatory Visit (HOSPITAL_COMMUNITY): Payer: Self-pay

## 2023-12-26 DIAGNOSIS — K921 Melena: Secondary | ICD-10-CM | POA: Diagnosis not present

## 2023-12-26 LAB — SURGICAL PATHOLOGY

## 2023-12-26 LAB — BASIC METABOLIC PANEL WITH GFR
Anion gap: 9 (ref 5–15)
BUN: 11 mg/dL (ref 8–23)
CO2: 27 mmol/L (ref 22–32)
Calcium: 8.9 mg/dL (ref 8.9–10.3)
Chloride: 98 mmol/L (ref 98–111)
Creatinine, Ser: 0.75 mg/dL (ref 0.61–1.24)
GFR, Estimated: >60 mL/min (ref >60)
Glucose, Bld: 127 mg/dL — ABNORMAL HIGH (ref 70–99)
Potassium: 4.5 mmol/L (ref 3.5–5.1)
Sodium: 134 mmol/L — ABNORMAL LOW (ref 135–145)

## 2023-12-26 LAB — CBC
HCT: 24.1 % — ABNORMAL LOW (ref 39.0–52.0)
Hemoglobin: 7.9 g/dL — ABNORMAL LOW (ref 13.0–17.0)
MCH: 29.2 pg (ref 26.0–34.0)
MCHC: 32.8 g/dL (ref 30.0–36.0)
MCV: 88.9 fL (ref 80.0–100.0)
Platelets: 233 K/uL (ref 150–400)
RBC: 2.71 MIL/uL — ABNORMAL LOW (ref 4.22–5.81)
RDW: 12.8 % (ref 11.5–15.5)
WBC: 7.1 K/uL (ref 4.0–10.5)
nRBC: 0 % (ref 0.0–0.2)

## 2023-12-26 LAB — MAGNESIUM: Magnesium: 1.9 mg/dL (ref 1.7–2.4)

## 2023-12-26 MED ORDER — SODIUM CHLORIDE 0.9 % IV SOLN
200.0000 mg | Freq: Once | INTRAVENOUS | Status: AC
  Administered 2023-12-26: 200 mg via INTRAVENOUS
  Filled 2023-12-26: qty 10

## 2023-12-26 MED ORDER — ESOMEPRAZOLE MAGNESIUM 40 MG PO CPDR
40.0000 mg | DELAYED_RELEASE_CAPSULE | Freq: Two times a day (BID) | ORAL | 0 refills | Status: AC
  Filled 2023-12-26: qty 120, 60d supply, fill #0

## 2023-12-26 MED ORDER — THIAMINE MONONITRATE 100 MG PO TABS
100.0000 mg | ORAL_TABLET | Freq: Every day | ORAL | Status: DC
  Administered 2023-12-26: 100 mg via ORAL
  Filled 2023-12-26: qty 1

## 2023-12-26 MED ORDER — FERROUS SULFATE 325 (65 FE) MG PO TABS
325.0000 mg | ORAL_TABLET | Freq: Two times a day (BID) | ORAL | 0 refills | Status: AC
  Filled 2023-12-26: qty 60, 30d supply, fill #0

## 2023-12-26 MED ORDER — FOLIC ACID 1 MG PO TABS
1.0000 mg | ORAL_TABLET | Freq: Every day | ORAL | Status: DC
  Administered 2023-12-26: 1 mg via ORAL
  Filled 2023-12-26: qty 1

## 2023-12-26 NOTE — Discharge Summary (Signed)
 Physician Discharge Summary  Dylan [name] FMW:[id number] DOB: [birth year] DOA: 12/24/2023  PCP: Lazoff, Shawn P, DO  Admit date: 12/24/2023 Discharge date: 12/26/2023  Admitted From: (Home) Disposition:  (Home )  Recommendations for Outpatient Follow-up:  Follow up with PCP in 1 weeks Please obtain BMP/CBC in one week Please follow up on the biopsy results of H. pylori and biopsy obtained during endoscopy Patient will need repeat endoscopy in 8 weeks ensure resolution of his ulcer Patient with known history of mild CAD, so for now as discussed with patient/daughter we will hold aspirin  on discharge, and this can be resumed in the future if felt strongly indicated by his cardiologist once his hemoglobin has been stable and ulcers appears resolved on repeat endoscopy Patient will need repeat CT/chest x-ray in 3 months to document resolution of incidental finding of right lower lobe ground glass opacity.   Diet recommendation: Heart Healthy  Brief/Interim Summary: PMH of HTN, mood disorder, GERD, neuropathy, HLD, CAD nonobstructive, BPH, T2DM presented to the hospital with complaints of melena, abdominal pain and lightheadedness. Found to have symptomatic anemia with hemoglobin dropping from 13-15 to 8.3. GI consulted.  Hemodynamically stable.  CT angio GI bleed negative for any extravasation of the contrast. For endoscopy 10/30, which was significant for gastric ulcer, no bleeding stigmata, please see discussion below.  Upper GI bleed due to gastric ulcer Iron  deficiency anemia Chronic blood loss anemia -GI input greatly appreciated, status post endoscopy significant for nonbleeding gastric ulcer, overall hemoglobin remained stable during hospital stay, so no transfusion required, but workup significant for low iron  so he received IV iron  prior to discharge, patient will be discharged on PPI twice daily for next 8 weeks, for now as discussed with patient/daughter plan to hold aspirin   till he is seen by his cardiologist and once endoscopy document healed ulcer. -CT angio abdomen negative for any other acute intra-abdominal pathology.   Incidental right lower lobe ground glass opacity. No respiratory symptoms. Recommend repeat CT/chest x-ray in 3 months for resolution. No indication for antibiotics     Hyponatremia. Depletion and dehydration, improved with IV fluids HTN. Holding home blood pressure medications for now.   History of CAD. Nonobstructive.  Mild, please see above discussion regarding aspirin     HLD. Crestor . Continue.   OSA. CPAP nightly.  Chronic arthritis. Counseled about avoiding NSAIDs  Hypertension - Blood pressure is soft to acceptable so he is counseled about holding all meds for now, and if blood pressure started to increase then to resume gradually after discussion with his PCP  Discharge Diagnoses:  Principal Problem:   GI bleed Active Problems:   Gastric ulcer with hemorrhage   Melena   Acute blood loss anemia    Discharge Instructions  Discharge Instructions     Diet - low sodium heart healthy   Complete by: As directed    Discharge instructions   Complete by: As directed    Follow with Primary MD Lazoff, Shawn P, DO in 7 days   Get CBC, CMP,  checked  by Primary MD next visit.    Activity: As tolerated with Full fall precautions use walker/cane & assistance as needed   Disposition Home    Diet: Heart Healthy   On your next visit with your primary care physician please Get Medicines reviewed and adjusted.   Please request your Prim.MD to go over all Hospital Tests and Procedure/Radiological results at the follow up, please get all Hospital records sent to your Prim MD by  signing hospital release before you go home.   If you experience worsening of your admission symptoms, develop shortness of breath, life threatening emergency, suicidal or homicidal thoughts you must seek medical attention immediately by  calling 911 or calling your MD immediately  if symptoms less severe.  You Must read complete instructions/literature along with all the possible adverse reactions/side effects for all the Medicines you take and that have been prescribed to you. Take any new Medicines after you have completely understood and accpet all the possible adverse reactions/side effects.   Do not drive, operating heavy machinery, perform activities at heights, swimming or participation in water  activities or provide baby sitting services if your were admitted for syncope or siezures until you have seen by Primary MD or a Neurologist and advised to do so again.  Do not drive when taking Pain medications.    Do not take more than prescribed Pain, Sleep and Anxiety Medications  Special Instructions: If you have smoked or chewed Tobacco  in the last 2 yrs please stop smoking, stop any regular Alcohol  and or any Recreational drug use.  Wear Seat belts while driving.   Please note  You were cared for by a hospitalist during your hospital stay. If you have any questions about your discharge medications or the care you received while you were in the hospital after you are discharged, you can call the unit and asked to speak with the hospitalist on call if the hospitalist that took care of you is not available. Once you are discharged, your primary care physician will handle any further medical issues. Please note that NO REFILLS for any discharge medications will be authorized once you are discharged, as it is imperative that you return to your primary care physician (or establish a relationship with a primary care physician if you do not have one) for your aftercare needs so that they can reassess your need for medications and monitor your lab values.   Increase activity slowly   Complete by: As directed       Allergies as of 12/26/2023       Reactions   Heparin  Anaphylaxis, Shortness Of Breath   Firvanq  [vancomycin ]  Hives, Other (See Comments)   Redman Syndrome   Quelicin [succinylcholine Chloride] Other (See Comments)   Does not wake up from anesthetic. Patient reports through testing that he was found to not produce the enzyme required to breakdown the succinylcholine.   Penicillins Rash   Childhood reaction  Tolerated Cephalosporin Date: 05/18/20.        Medication List     STOP taking these medications    amLODipine  10 MG tablet Commonly known as: NORVASC    aspirin  EC 81 MG tablet   B-12 PO   carvedilol  12.5 MG tablet Commonly known as: COREG    irbesartan  150 MG tablet Commonly known as: AVAPRO    Mounjaro 2.5 MG/0.5ML Pen Generic drug: tirzepatide   spironolactone 25 MG tablet Commonly known as: ALDACTONE       TAKE these medications    cetirizine 10 MG tablet Commonly known as: ZYRTEC Take 10 mg by mouth daily as needed for allergies.   COQ-10 PO Take 1 capsule by mouth daily.   escitalopram 20 MG tablet Commonly known as: LEXAPRO Take 20 mg by mouth daily.   esomeprazole  40 MG capsule Commonly known as: NEXIUM  Take 1 capsule (40 mg total) by mouth 2 (two) times daily before a meal. What changed: when to take this   ferrous  sulfate 325 (65 FE) MG tablet Take 1 tablet (325 mg total) by mouth 2 (two) times daily with a meal.   fluticasone  50 MCG/ACT nasal spray Commonly known as: FLONASE  Place 2 sprays into both nostrils daily. What changed:  how much to take when to take this reasons to take this   gabapentin  300 MG capsule Commonly known as: NEURONTIN  Take 300 mg by mouth in the morning, at noon, and at bedtime.   MELATONIN PO Take 1-2 tablets by mouth at bedtime as needed (sleep).   oxyCODONE -acetaminophen  10-325 MG tablet Commonly known as: PERCOCET Take 1 tablet by mouth 4 (four) times daily as needed for pain.   POTASSIUM PO Take 1 each by mouth daily. OTC potassium gummy   rosuvastatin  20 MG tablet Commonly known as: CRESTOR  TAKE 1  TABLET BY MOUTH EVERY DAY   traZODone 50 MG tablet Commonly known as: DESYREL Take 25 mg by mouth at bedtime as needed for sleep.   Vitamin C Chew Chew 1 tablet by mouth daily.   VITAMIN D-3 PO Take 1 each by mouth daily. OTC vitamin D3 gummy.        Allergies  Allergen Reactions   Heparin  Anaphylaxis and Shortness Of Breath   Firvanq  [Vancomycin ] Hives and Other (See Comments)    Redman Syndrome   Quelicin [Succinylcholine Chloride] Other (See Comments)    Does not wake up from anesthetic. Patient reports through testing that he was found to not produce the enzyme required to breakdown the succinylcholine.   Penicillins Rash    Childhood reaction   Tolerated Cephalosporin Date: 05/18/20.      Consultations: Gastroenterology  Procedures/Studies: CT ANGIO GI BLEED Result Date: 12/24/2023 CLINICAL DATA:  GI bleed. EXAM: CTA ABDOMEN AND PELVIS WITHOUT AND WITH CONTRAST TECHNIQUE: Multidetector CT imaging of the abdomen and pelvis was performed using the standard protocol during bolus administration of intravenous contrast. Multiplanar reconstructed images and MIPs were obtained and reviewed to evaluate the vascular anatomy. RADIATION DOSE REDUCTION: This exam was performed according to the departmental dose-optimization program which includes automated exposure control, adjustment of the mA and/or kV according to patient size and/or use of iterative reconstruction technique. CONTRAST:  OMNIPAQUE  IOHEXOL  350 MG/ML SOLN COMPARISON:  CT scan abdomen and pelvis from 04/04/2021. FINDINGS: VASCULAR Aorta: Normal caliber aorta without aneurysm, dissection, vasculitis or significant stenosis. Mild-to-moderate peripheral atherosclerotic vascular calcifications noted. Celiac: Mild-to-moderate narrowing at the origin due to calcified plaques. Otherwise, patent without evidence of aneurysm, dissection or vasculitis. SMA: Patent without evidence of aneurysm, dissection, vasculitis or  significant stenosis. Renals: Both renal arteries are patent without evidence of aneurysm, dissection, vasculitis, fibromuscular dysplasia or significant stenosis. IMA: Patent without evidence of aneurysm, dissection, vasculitis or significant stenosis. Inflow: Patent without evidence of aneurysm, dissection, vasculitis or significant stenosis. Proximal Outflow: Bilateral common femoral and visualized portions of the superficial and profunda femoral arteries are patent without evidence of aneurysm, dissection, vasculitis or significant stenosis. Veins: No obvious venous abnormality within the limitations of this arterial phase study. Review of the MIP images confirms the above findings. NON-VASCULAR Lower chest: There are patchy ground-glass opacities in the right lung lower lobe which are new since the prior study. These are nonspecific and may represent sequela of infection or inflammation. Follow-up examination is recommended in 3 months to document stability versus resolution. Bilateral lungs are otherwise clear. No consolidation, collapse, lung mass or pleural effusion. The heart is normal in size. No pericardial effusion. Hepatobiliary: The liver is normal in  size. Non-cirrhotic configuration. No suspicious mass. No intrahepatic or extrahepatic bile duct dilation. No calcified gallstones. Normal gallbladder wall thickness. No pericholecystic inflammatory changes. Pancreas: Unremarkable. No pancreatic ductal dilatation or surrounding inflammatory changes. Spleen: Within normal limits. No focal lesion. Adrenals/Urinary Tract: Adrenal glands are unremarkable. No suspicious renal mass. There are at least 2 simple cysts in the right kidney with largest in the lower pole measuring up to 1.6 x 1.7 cm. No suspicious renal mass seen. No nephroureterolithiasis or obstructive uropathy. Unremarkable urinary bladder. Stomach/Bowel: No disproportionate dilation of the small or large bowel loops. No evidence of abnormal  bowel wall thickening or inflammatory changes. The appendix was not visualized; however there is no acute inflammatory process in the right lower quadrant. There are multiple scattered colonic diverticula without diverticulitis. Vascular/Lymphatic: No ascites or pneumoperitoneum. No abdominal or pelvic lymphadenopathy, by size criteria. Reproductive: Normal size prostate. Probable post TURP defect noted. Symmetric seminal vesicles. Other: The visualized soft tissues and abdominal wall are unremarkable. Musculoskeletal: No suspicious osseous lesions. There are mild multilevel degenerative changes in the visualized spine. Coflex interspinous distraction device noted at L3-4 and L4-5 spinal processes level. IMPRESSION: 1. No acute inflammatory process identified within the abdomen or pelvis. 2. No active extravasation of contrast noted to suggest active GI bleeding. 3. There are patchy ground-glass opacities in the right lung lower lobe, which are nonspecific and may represent sequela of infection or inflammation. Follow-up examination is recommended in 3 months to document stability versus resolution. Bilateral lungs are otherwise clear. 4. Multiple other nonacute observations, as described above. Aortic Atherosclerosis (ICD10-I70.0). Electronically Signed   By: Ree Molt M.D.   On: 12/24/2023 14:43     Subjective: No significant events overnight, he denies any complaints this morning  Discharge Exam: Vitals:   12/26/23 0400 12/26/23 0839  BP: 117/67 132/70  Pulse: 80   Resp: 18 19  Temp: 98.1 F (36.7 C) 98.3 F (36.8 C)  SpO2: 92%    Vitals:   12/25/23 2020 12/26/23 0000 12/26/23 0400 12/26/23 0839  BP: 132/68 (!) 106/55 117/67 132/70  Pulse:  81 80   Resp: 18 12 18 19   Temp: 97.8 F (36.6 C) 98 F (36.7 C) 98.1 F (36.7 C) 98.3 F (36.8 C)  TempSrc: Oral Oral Oral Oral  SpO2: 98% 97% 92%   Weight:      Height:        General: Pt is alert, awake, not in acute  distress Cardiovascular: RRR, S1/S2 +, no rubs, no gallops Respiratory: CTA bilaterally, no wheezing, no rhonchi Abdominal: Soft, NT, ND, bowel sounds + Extremities: no edema, no cyanosis    The results of significant diagnostics from this hospitalization (including imaging, microbiology, ancillary and laboratory) are listed below for reference.     Microbiology: No results found for this or any previous visit (from the past 240 hours).   Labs: BNP (last 3 results) No results for input(s): BNP in the last 8760 hours. Basic Metabolic Panel: Recent Labs  Lab 12/24/23 1134 12/25/23 0327 12/26/23 0310  NA 129* 130* 134*  K 4.1 4.1 4.5  CL 97* 99 98  CO2 23 24 27   GLUCOSE 112* 90 127*  BUN 25* 12 11  CREATININE 0.80 0.82 0.75  CALCIUM  9.4 8.4* 8.9  MG  --  1.8 1.9  PHOS  --  4.1  --    Liver Function Tests: Recent Labs  Lab 12/24/23 1134  AST 26  ALT 30  ALKPHOS 50  BILITOT  0.3  PROT 6.0*  ALBUMIN 3.9   Recent Labs  Lab 12/25/23 1146  LIPASE 26   No results for input(s): AMMONIA in the last 168 hours. CBC: Recent Labs  Lab 12/24/23 1134 12/24/23 2134 12/25/23 0327 12/25/23 1146 12/25/23 1736 12/26/23 0310  WBC 7.8  --  7.2 5.4 6.2 7.1  NEUTROABS 4.0  --   --   --   --   --   HGB 8.3* 7.9* 7.5* 8.1* 8.1* 7.9*  HCT 24.5* 23.2* 22.2* 23.7* 23.8* 24.1*  MCV 86.9  --  86.4 86.8 87.2 88.9  PLT 249  --  220 244 247 233   Cardiac Enzymes: No results for input(s): CKTOTAL, CKMB, CKMBINDEX, TROPONINI in the last 168 hours. BNP: Invalid input(s): POCBNP CBG: Recent Labs  Lab 12/24/23 1124  GLUCAP 86   D-Dimer No results for input(s): DDIMER in the last 72 hours. Hgb A1c No results for input(s): HGBA1C in the last 72 hours. Lipid Profile No results for input(s): CHOL, HDL, LDLCALC, TRIG, CHOLHDL, LDLDIRECT in the last 72 hours. Thyroid  function studies No results for input(s): TSH, T4TOTAL, T3FREE, THYROIDAB in  the last 72 hours.  Invalid input(s): FREET3 Anemia work up Recent Labs    12/25/23 1146  VITAMINB12 2,955*  FOLATE >20.0  FERRITIN 16*  TIBC 344  IRON  39*  RETICCTPCT 2.5   Urinalysis    Component Value Date/Time   COLORURINE YELLOW 11/11/2017 1721   APPEARANCEUR CLEAR 11/11/2017 1721   LABSPEC 1.010 11/11/2017 1721   PHURINE 6.5 11/11/2017 1721   GLUCOSEU NEGATIVE 11/11/2017 1721   HGBUR NEGATIVE 11/11/2017 1721   BILIRUBINUR NEGATIVE 11/11/2017 1721   BILIRUBINUR Neg 06/29/2015 1007   KETONESUR NEGATIVE 11/11/2017 1721   PROTEINUR NEGATIVE 11/11/2017 1721   UROBILINOGEN 0.2 06/29/2015 1007   UROBILINOGEN 0.2 10/16/2012 1401   NITRITE NEGATIVE 11/11/2017 1721   LEUKOCYTESUR NEGATIVE 11/11/2017 1721   Sepsis Labs Recent Labs  Lab 12/25/23 0327 12/25/23 1146 12/25/23 1736 12/26/23 0310  WBC 7.2 5.4 6.2 7.1   Microbiology No results found for this or any previous visit (from the past 240 hours).   Time coordinating discharge: Over 30 minutes  SIGNED:   Brayton Lye, MD  [facility] 12/26/2023, 10:32 AM Pager   If 7PM-7AM, please contact night-coverage [email or web address] Password [email or web address]

## 2023-12-26 NOTE — Discharge Instructions (Signed)
 Follow with Primary MD Lazoff, Shawn P, DO in 7 days   Get CBC, CMP,  checked  by Primary MD next visit.    Activity: As tolerated with Full fall precautions use walker/cane & assistance as needed   Disposition Home    Diet: Heart Healthy   On your next visit with your primary care physician please Get Medicines reviewed and adjusted.   Please request your Prim.MD to go over all Hospital Tests and Procedure/Radiological results at the follow up, please get all Hospital records sent to your Prim MD by signing hospital release before you go home.   If you experience worsening of your admission symptoms, develop shortness of breath, life threatening emergency, suicidal or homicidal thoughts you must seek medical attention immediately by calling 911 or calling your MD immediately  if symptoms less severe.  You Must read complete instructions/literature along with all the possible adverse reactions/side effects for all the Medicines you take and that have been prescribed to you. Take any new Medicines after you have completely understood and accpet all the possible adverse reactions/side effects.   Do not drive, operating heavy machinery, perform activities at heights, swimming or participation in water  activities or provide baby sitting services if your were admitted for syncope or siezures until you have seen by Primary MD or a Neurologist and advised to do so again.  Do not drive when taking Pain medications.    Do not take more than prescribed Pain, Sleep and Anxiety Medications  Special Instructions: If you have smoked or chewed Tobacco  in the last 2 yrs please stop smoking, stop any regular Alcohol  and or any Recreational drug use.  Wear Seat belts while driving.   Please note  You were cared for by a hospitalist during your hospital stay. If you have any questions about your discharge medications or the care you received while you were in the hospital after you are  discharged, you can call the unit and asked to speak with the hospitalist on call if the hospitalist that took care of you is not available. Once you are discharged, your primary care physician will handle any further medical issues. Please note that NO REFILLS for any discharge medications will be authorized once you are discharged, as it is imperative that you return to your primary care physician (or establish a relationship with a primary care physician if you do not have one) for your aftercare needs so that they can reassess your need for medications and monitor your lab values.

## 2023-12-26 NOTE — Progress Notes (Signed)
 DISCHARGE NOTE HOME Dylan [name] to be discharged Home per MD order. Discussed prescriptions and follow up appointments with the patient. Prescriptions given to patient; medication list explained in detail. Patient verbalized understanding.  Skin clean, dry and intact without evidence of skin break down, no evidence of skin tears noted. IV catheter discontinued intact. Site without signs and symptoms of complications. Dressing and pressure applied. Pt denies pain at the site currently. No complaints noted.  Patient free of lines, drains, and wounds.   An After Visit Summary (AVS) was printed and given to the patient. Patient escorted via wheelchair, and discharged home via private auto.  [name], RN

## 2023-12-31 ENCOUNTER — Encounter (HOSPITAL_COMMUNITY): Admission: RE | Admit: 2023-12-31 | Source: Ambulatory Visit

## 2024-01-01 NOTE — Anesthesia Postprocedure Evaluation (Signed)
 Anesthesia Post Note  Patient: Dylan [name]  Procedure(s) Performed: EGD (ESOPHAGOGASTRODUODENOSCOPY)     Patient location during evaluation: Endoscopy Anesthesia Type: MAC Level of consciousness: awake and alert Pain management: pain level controlled Vital Signs Assessment: post-procedure vital signs reviewed and stable Respiratory status: spontaneous breathing, nonlabored ventilation and respiratory function stable Cardiovascular status: blood pressure returned to baseline and stable Postop Assessment: no apparent nausea or vomiting Anesthetic complications: no   No notable events documented.                  Demetrica Zipp

## 2024-01-01 NOTE — Telephone Encounter (Signed)
 Spoke to patient. He has scheduled virtual previsit for 02/23/24 at 830 am and has scheduled [facility] endoscopy on 03/09/24 at 800 am.

## 2024-01-04 ENCOUNTER — Ambulatory Visit: Payer: Self-pay | Admitting: Gastroenterology

## 2024-01-04 NOTE — Progress Notes (Signed)
 Dylan [name],  The biopsies taken from your stomach were notable for mild reactive gastropathy which is a common finding and often related to use of certain medications (usually NSAIDs), but there was no evidence of Helicobacter pylori infection.  Your daily aspirin  may have been the underlying cause of this ulcer.  As discussed, please discuss the risks and benefits of daily aspirin  use with your primary care or cardiologist. Please continue to take the pantoprazole  twice daily for 8 weeks, then once daily until your repeat upper endoscopy in January.

## 2024-01-13 DIAGNOSIS — M1611 Unilateral primary osteoarthritis, right hip: Secondary | ICD-10-CM

## 2024-02-21 NOTE — Progress Notes (Signed)
 COVID Vaccine received:  []  No [x]  Yes Date of any COVID positive Test in last 90 days:  PCP - Shawn Lazoff, DO  Cardiologist - Deward Gull, MD    Chest x-ray - 04-14-2019  2v  Epic EKG - 10-14-2022  Epic  Stress Test - 11-12-2017 Epic ECHO -  Cardiac Cath -11-14-2017  [facility] by Dr. Dann  CT Coronary Calcium  score:   Pacemaker / ICD device []  No []  Yes   Spinal Cord Stimulator:[]  No []  Yes       History of Sleep Apnea? []  No [x]  Yes   CPAP used?- []  No []  Yes    Medication on DOS: esomeprazole , carvedilol , escitalopram  (Lexapro ), gabapentin , Oxycodone - APAP  Hold DOS:  Patient has: []  NO Hx DM   []  Pre-DM   []  DM1  []   DM2 Does the patient monitor blood sugar?   []  N/A   []  No []  Yes  Last A1c was:        on      Does patient have a [employer] or Dexcom? []  No []  Yes   Fasting Blood Sugar Ranges-  Checks Blood Sugar _____ times a day  Blood Thinner / Instructions: Aspirin  Instructions:  Activity level: Able to walk up 2 flights of stairs without becoming significantly short of breath or having chest pain?   []    Yes   []  No,  would have:  Patient can perform ADLs without assistance.  []   Yes    []  No   Comments: Trouble waking up from Succycholine, does not have the enzyme to break down, Pseudocholinesterase deficiency   Anesthesia review: HTN, nonobstructive CAD, anxiety, hx ACDF - C4-5 in 2013, OSA- no CPAP, GERD,   Recent Hosp. For GI bleed - anemia   Patient denies any S&S of respiratory illness or Covid - no shortness of breath, fever, cough or chest pain at PAT appointment.  Patient verbalized understanding and agreement to the Pre-Surgical Instructions that were given to them at this PAT appointment. Patient was also educated of the need to review these PAT instructions again prior to his/her surgery.I reviewed the appropriate phone numbers to call if they have any and questions or concerns.

## 2024-02-22 NOTE — Patient Instructions (Signed)
 SURGICAL WAITING ROOM VISITATION Patients having surgery or a procedure may have no more than 2 support people in the waiting area - these visitors may rotate in the visitor waiting room.   If the patient needs to stay at the hospital during part of their recovery, the visitor guidelines for inpatient rooms apply.  PRE-OP VISITATION  Pre-op nurse will coordinate an appropriate time for 1 support person to accompany the patient in pre-op.  This support person may not rotate.  This visitor will be contacted when the time is appropriate for the visitor to come back in the pre-op area.  Please refer to the [facility] website for the visitor guidelines for Inpatients (after your surgery is over and you are in a regular room).  Temporary Visitor Restrictions  Children ages 51 and under will not be able to visit patients in [facility] under most circumstances. Visitation is not restricted outside of hospitals unless noted otherwise in the [facility] and Location Specific Visitation Guidelines at :      [email or web address] Visitors with respiratory illnesses are discouraged from visiting and should remain at home.  You are not required to quarantine at this time prior to your surgery. However, you must do this: Hand Hygiene often Do NOT share personal items Notify your provider if you are in close contact with someone who has COVID or you develop fever 100.4 or greater, new onset of sneezing, cough, sore throat, shortness of breath or body aches.  If you test positive for Covid or have been in contact with anyone that has tested positive in the last 10 days please notify you surgeon.    Your procedure is scheduled on:  Tuesday  03-02-2024  Report to [facility] Main Entrance: [address] entrance where the [employer] is available.   Report to admitting at:  1:00   AM  Call this number if you have any questions or problems the morning of surgery [phone]  Do not eat food  after Midnight the night prior to your surgery/procedure.  After Midnight you may have the following liquids until  12:30 PM DAY OF SURGERY  Clear Liquid Diet Water  Black Coffee (sugar ok, NO MILK/CREAM OR CREAMERS)  Tea (sugar ok, NO MILK/CREAM OR CREAMERS) regular and decaf                             Plain Jell-O  with no fruit (NO RED)                                           Fruit ices (not with fruit pulp, NO RED)                                     Popsicles (NO RED)                                                                  Juice: NO CITRUS JUICES: only apple, WHITE grape, WHITE cranberry Sports drinks like Gatorade or Powerade (NO RED)  The day of surgery:  Drink ONE (1) Pre-Surgery G2 at   12:30     AM the morning of surgery. Drink in one sitting. Do not sip.  This drink was given to you during your hospital pre-op appointment visit. Nothing else to drink after completing the Pre-Surgery G2 : No candy, chewing gum or throat lozenges.    FOLLOW ANY ADDITIONAL PRE OP INSTRUCTIONS YOU RECEIVED FROM YOUR SURGEON'S OFFICE!!!   Oral Hygiene is also important to reduce your risk of infection.        Remember - BRUSH YOUR TEETH THE MORNING OF SURGERY WITH YOUR REGULAR TOOTHPASTE  Do NOT smoke after Midnight the night before surgery.  STOP TAKING all Vitamins, Herbs and supplements 1 week before your surgery.   Take ONLY these medicines the morning of surgery with A SIP OF WATER : esomeprazole , carvedilol , escitalopram  (Lexapro ), gabapentin , Oxycodone - APAP                   You may not have any metal on your body including  jewelry, and body piercing  Do not wear lotions, powders, cologne, or deodorant  Men may shave face and neck.  Contacts, Hearing Aids, dentures or bridgework may not be worn into surgery. DENTURES WILL BE REMOVED PRIOR TO SURGERY PLEASE DO NOT APPLY Poly grip OR ADHESIVES!!!  You may bring a small overnight bag with you on the  day of surgery, only pack items that are not valuable. [facility] IS NOT RESPONSIBLE   FOR VALUABLES THAT ARE LOST OR STOLEN.   Do not bring your home medications to the hospital. The Pharmacy will dispense medications listed on your medication list to you during your admission in the Hospital.  Please read over the following fact sheets you were given: IF YOU HAVE QUESTIONS ABOUT YOUR PRE-OP INSTRUCTIONS, PLEASE CALL [phone].      Pre-operative 4 CHG Bath Instructions   You can play a key role in reducing the risk of infection after surgery. Your skin needs to be as free of germs as possible. You can reduce the number of germs on your skin by washing with CHG (chlorhexidine  gluconate) soap before surgery. CHG is an antiseptic soap that kills germs and continues to kill germs even after washing.   DO NOT use if you have an allergy to chlorhexidine /CHG or antibacterial soaps. If your skin becomes reddened or irritated, stop using the CHG and notify one of our RNs at [phone]  Please shower with the CHG soap starting 4 days before surgery using the following schedule: Friday  02-27-2024     Do NOT use CHG soap                                                                                                                      the morning of your  surgery.         Please keep in mind the following:  DO NOT shave, including legs and underarms, starting the day of your first shower.   You may shave your face at any point before/day of surgery.  Place clean sheets on your bed the day you start using CHG soap. Use a clean washcloth (not used since being washed) for each shower. DO NOT sleep with pets once you start using the CHG.  CHG Shower Instructions:  If you choose to wash your hair and private area, wash first with your normal shampoo/soap.  After you use  shampoo/soap, rinse your hair and body thoroughly to remove shampoo/soap residue.  Turn the water  OFF and apply about 3 tablespoons (45 ml) of CHG soap to a CLEAN washcloth.  Apply CHG soap ONLY FROM YOUR NECK DOWN TO YOUR TOES (washing for 3-5 minutes)  DO NOT use CHG soap on face, private areas, open wounds, or sores.  Pay special attention to the area where your surgery is being performed.  If you are having back surgery, having someone wash your back for you may be helpful. Wait 2 minutes after CHG soap is applied, then you may rinse off the CHG soap.  Pat dry with a clean towel  Put on clean clothes/pajamas   If you choose to wear lotion, please use ONLY the CHG-compatible lotions on the back of this paper.     Additional instructions for the day of surgery: DO NOT APPLY any CHG Soap,  lotions, deodorants, cologne, or perfumes on the day of surgery  Put on clean/comfortable clothes.  Brush your teeth.  Ask your nurse before applying any prescription medications to the skin.   CHG Compatible Lotions   Aveeno Moisturizing lotion  Cetaphil Moisturizing Cream  Cetaphil Moisturizing Lotion  Clairol Herbal Essence Moisturizing Lotion, Dry Skin  Clairol Herbal Essence Moisturizing Lotion, Extra Dry Skin  Clairol Herbal Essence Moisturizing Lotion, Normal Skin  Curel Age Defying Therapeutic Moisturizing Lotion with Alpha Hydroxy  Curel Extreme Care Body Lotion  Curel Soothing Hands Moisturizing Hand Lotion  Curel Therapeutic Moisturizing Cream, Fragrance-Free  Curel Therapeutic Moisturizing Lotion, Fragrance-Free  Curel Therapeutic Moisturizing Lotion, Original Formula  Eucerin Daily Replenishing Lotion  Eucerin Dry Skin Therapy Plus Alpha Hydroxy Crme  Eucerin Dry Skin Therapy Plus Alpha Hydroxy Lotion  Eucerin Original Crme  Eucerin Original Lotion  Eucerin Plus Crme Eucerin Plus Lotion  Eucerin TriLipid Replenishing Lotion  Keri Anti-Bacterial Hand Lotion  Keri Deep  Conditioning Original Lotion Dry Skin Formula Softly Scented  Keri Deep Conditioning Original Lotion, Fragrance Free Sensitive Skin Formula  Keri Lotion Fast Absorbing Fragrance Free Sensitive Skin Formula  Keri Lotion Fast Absorbing Softly Scented Dry Skin Formula  Keri Original Lotion  Keri Skin Renewal Lotion Keri Silky Smooth Lotion  Keri Silky Smooth Sensitive Skin Lotion  Nivea Body Creamy Conditioning Oil  Nivea Body Extra Enriched Lotion  Nivea Body Original Lotion  Nivea Body Sheer Moisturizing Lotion Nivea Crme  Nivea Skin Firming Lotion  NutraDerm 30 Skin Lotion  NutraDerm Skin Lotion  NutraDerm Therapeutic Skin Cream  NutraDerm Therapeutic Skin Lotion  ProShield Protective Hand Cream  Provon moisturizing lotion   FAILURE TO FOLLOW THESE INSTRUCTIONS MAY RESULT IN THE CANCELLATION OF YOUR SURGERY  PATIENT SIGNATURE_________________________________  NURSE SIGNATURE__________________________________  ________________________________________________________________________       Dylan [name]    An incentive spirometer is a tool that can help keep your lungs clear and active. This tool measures how  well you are filling your lungs with each breath. Taking long deep breaths may help reverse or decrease the chance of developing breathing (pulmonary) problems (especially infection) following: A long period of time when you are unable to move or be active. BEFORE THE PROCEDURE  If the spirometer includes an indicator to show your best effort, your nurse or respiratory therapist will set it to a desired goal. If possible, sit up straight or lean slightly forward. Try not to slouch. Hold the incentive spirometer in an upright position. INSTRUCTIONS FOR USE  Sit on the edge of your bed if possible, or sit up as far as you can in bed or on a chair. Hold the incentive spirometer in an upright position. Breathe out normally. Place the mouthpiece in your mouth and  seal your lips tightly around it. Breathe in slowly and as deeply as possible, raising the piston or the ball toward the top of the column. Hold your breath for 3-5 seconds or for as long as possible. Allow the piston or ball to fall to the bottom of the column. Remove the mouthpiece from your mouth and breathe out normally. Rest for a few seconds and repeat Steps 1 through 7 at least 10 times every 1-2 hours when you are awake. Take your time and take a few normal breaths between deep breaths. The spirometer may include an indicator to show your best effort. Use the indicator as a goal to work toward during each repetition. After each set of 10 deep breaths, practice coughing to be sure your lungs are clear. If you have an incision (the cut made at the time of surgery), support your incision when coughing by placing a pillow or rolled up towels firmly against it. Once you are able to get out of bed, walk around indoors and cough well. You may stop using the incentive spirometer when instructed by your caregiver.  RISKS AND COMPLICATIONS Take your time so you do not get dizzy or light-headed. If you are in pain, you may need to take or ask for pain medication before doing incentive spirometry. It is harder to take a deep breath if you are having pain. AFTER USE Rest and breathe slowly and easily. It can be helpful to keep track of a log of your progress. Your caregiver can provide you with a simple table to help with this. If you are using the spirometer at home, follow these instructions: SEEK MEDICAL CARE IF:  You are having difficultly using the spirometer. You have trouble using the spirometer as often as instructed. Your pain medication is not giving enough relief while using the spirometer. You develop fever of 100.5 F (38.1 C) or higher.                                                                                                    SEEK IMMEDIATE MEDICAL CARE IF:  You cough up bloody  sputum that had not been present before. You develop fever of 102 F (38.9 C) or greater. You develop worsening pain at or near the incision site. MAKE  SURE YOU:  Understand these instructions. Will watch your condition. Will get help right away if you are not doing well or get worse. Document Released: 06/24/2006 Document Revised: 05/06/2011 Document Reviewed: 08/25/2006 [facility] Patient Information 2014 [place], [place].        WHAT IS A BLOOD TRANSFUSION? Blood Transfusion Information  A transfusion is the replacement of blood or some of its parts. Blood is made up of multiple cells which provide different functions. Red blood cells carry oxygen and are used for blood loss replacement. White blood cells fight against infection. Platelets control bleeding. Plasma helps clot blood. Other blood products are available for specialized needs, such as hemophilia or other clotting disorders. BEFORE THE TRANSFUSION  Who gives blood for transfusions?  Healthy volunteers who are fully evaluated to make sure their blood is safe. This is blood bank blood. Transfusion therapy is the safest it has ever been in the practice of medicine. Before blood is taken from a donor, a complete history is taken to make sure that person has no history of diseases nor engages in risky social behavior (examples are intravenous drug use or sexual activity with multiple partners). The donor's travel history is screened to minimize risk of transmitting infections, such as malaria. The donated blood is tested for signs of infectious diseases, such as HIV and hepatitis. The blood is then tested to be sure it is compatible with you in order to minimize the chance of a transfusion reaction. If you or a relative donates blood, this is often done in anticipation of surgery and is not appropriate for emergency situations. It takes many days to process the donated blood. RISKS AND COMPLICATIONS Although transfusion therapy is  very safe and saves many lives, the main dangers of transfusion include:  Getting an infectious disease. Developing a transfusion reaction. This is an allergic reaction to something in the blood you were given. Every precaution is taken to prevent this. The decision to have a blood transfusion has been considered carefully by your caregiver before blood is given. Blood is not given unless the benefits outweigh the risks. AFTER THE TRANSFUSION Right after receiving a blood transfusion, you will usually feel much better and more energetic. This is especially true if your red blood cells have gotten low (anemic). The transfusion raises the level of the red blood cells which carry oxygen, and this usually causes an energy increase. The nurse administering the transfusion will monitor you carefully for complications. HOME CARE INSTRUCTIONS  No special instructions are needed after a transfusion. You may find your energy is better. Speak with your caregiver about any limitations on activity for underlying diseases you may have. SEEK MEDICAL CARE IF:  Your condition is not improving after your transfusion. You develop redness or irritation at the intravenous (IV) site. SEEK IMMEDIATE MEDICAL CARE IF:  Any of the following symptoms occur over the next 12 hours: Shaking chills. You have a temperature by mouth above 102 F (38.9 C), not controlled by medicine. Chest, back, or muscle pain. People around you feel you are not acting correctly or are confused. Shortness of breath or difficulty breathing. Dizziness and fainting. You get a rash or develop hives. You have a decrease in urine output. Your urine turns a dark color or changes to pink, red, or brown. Any of the following symptoms occur over the next 10 days: You have a temperature by mouth above 102 F (38.9 C), not controlled by medicine. Shortness of breath. Weakness after normal activity. The  white part of the eye turns yellow  (jaundice). You have a decrease in the amount of urine or are urinating less often. Your urine turns a dark color or changes to pink, red, or brown. Document Released: 02/09/2000 Document Revised: 05/06/2011 Document Reviewed: 09/28/2007 [facility] Patient Information 2014 [place], [place].  _______________________________________________________________________        If you would like to see a video about joint replacement:   [email or web address]

## 2024-02-23 ENCOUNTER — Encounter (HOSPITAL_COMMUNITY)
Admission: RE | Admit: 2024-02-23 | Discharge: 2024-02-23 | Disposition: A | Discharge status: Home / Self Care | Source: Ambulatory Visit | Attending: Orthopedic Surgery | Admitting: Orthopedic Surgery

## 2024-02-23 ENCOUNTER — Other Ambulatory Visit: Payer: Self-pay

## 2024-02-23 ENCOUNTER — Encounter (HOSPITAL_COMMUNITY): Payer: Self-pay

## 2024-02-23 ENCOUNTER — Ambulatory Visit

## 2024-02-23 VITALS — BP 140/72 | HR 66 | Temp 98.5°F | Resp 16 | Ht 72.0 in | Wt 177.0 lb

## 2024-02-23 VITALS — Ht 72.0 in | Wt 183.0 lb

## 2024-02-23 DIAGNOSIS — E8809 Other disorders of plasma-protein metabolism, not elsewhere classified: Secondary | ICD-10-CM | POA: Diagnosis not present

## 2024-02-23 DIAGNOSIS — Z01812 Encounter for preprocedural laboratory examination: Secondary | ICD-10-CM | POA: Diagnosis present

## 2024-02-23 DIAGNOSIS — M1611 Unilateral primary osteoarthritis, right hip: Secondary | ICD-10-CM | POA: Diagnosis not present

## 2024-02-23 DIAGNOSIS — I2510 Atherosclerotic heart disease of native coronary artery without angina pectoris: Secondary | ICD-10-CM | POA: Diagnosis not present

## 2024-02-23 DIAGNOSIS — Z01818 Encounter for other preprocedural examination: Secondary | ICD-10-CM

## 2024-02-23 DIAGNOSIS — I10 Essential (primary) hypertension: Secondary | ICD-10-CM | POA: Diagnosis not present

## 2024-02-23 DIAGNOSIS — K259 Gastric ulcer, unspecified as acute or chronic, without hemorrhage or perforation: Secondary | ICD-10-CM

## 2024-02-23 HISTORY — DX: Prediabetes: R73.03

## 2024-02-23 LAB — CBC
HCT: 36.3 % — ABNORMAL LOW (ref 39.0–52.0)
Hemoglobin: 11.7 g/dL — ABNORMAL LOW (ref 13.0–17.0)
MCH: 26.8 pg (ref 26.0–34.0)
MCHC: 32.2 g/dL (ref 30.0–36.0)
MCV: 83.3 fL (ref 80.0–100.0)
Platelets: 358 K/uL (ref 150–400)
RBC: 4.36 MIL/uL (ref 4.22–5.81)
RDW: 12.9 % (ref 11.5–15.5)
WBC: 7.3 K/uL (ref 4.0–10.5)
nRBC: 0 % (ref 0.0–0.2)

## 2024-02-23 LAB — BASIC METABOLIC PANEL WITH GFR
Anion gap: 9 (ref 5–15)
BUN: 5 mg/dL — ABNORMAL LOW (ref 8–23)
CO2: 26 mmol/L (ref 22–32)
Calcium: 9.1 mg/dL (ref 8.9–10.3)
Chloride: 94 mmol/L — ABNORMAL LOW (ref 98–111)
Creatinine, Ser: 0.69 mg/dL (ref 0.61–1.24)
GFR, Estimated: >60 mL/min
Glucose, Bld: 114 mg/dL — ABNORMAL HIGH (ref 70–99)
Potassium: 3.8 mmol/L (ref 3.5–5.1)
Sodium: 129 mmol/L — ABNORMAL LOW (ref 135–145)

## 2024-02-23 LAB — SURGICAL PCR SCREEN
MRSA, PCR: POSITIVE — AB
Staphylococcus aureus: POSITIVE — AB

## 2024-02-23 NOTE — Progress Notes (Signed)
No egg or soy allergy known to patient  No issues known to pt with past sedation with any surgeries or procedures Patient denies ever being told they had issues or difficulty with intubation  No FH of Malignant Hyperthermia Pt is not on diet pills Pt is not on  home 02  Pt is not on blood thinners  Pt denies issues with constipation  No A fib or A flutter Have any cardiac testing pending--no Pt can ambulate with cane Pt denies use of chewing tobacco Discussed diabetic I weight loss medication holds Discussed NSAID holds Checked BMI Pt instructed to use Singlecare.com or GoodRx for a price reduction on prep  Patient's chart reviewed by Cathlyn Parsons CNRA prior to previsit and patient appropriate for the [facility].  Pre visit completed and red dot placed by patient's name on their procedure day (on provider's schedule).

## 2024-02-23 NOTE — Progress Notes (Signed)
 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 Appropriate notes have been placed on the patient's chart. This note has been routed to Dr Ernie and Rosina Calin, PA  for review. The Patient's surgery is currently scheduled for: 03-02-24  at [facility].  Shawnee Aloe, BSN, CVRN-BC   Pre-Surgical Testing Nurse [facility]  [phone]

## 2024-02-24 ENCOUNTER — Encounter (HOSPITAL_COMMUNITY): Payer: Self-pay

## 2024-02-24 NOTE — Progress Notes (Signed)
 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  Prior complication from anesthesia includes Pseudocholinesterase deficiency. Reportedly patient has prolonged emergence with succycholine use due to above issue.  Hx of recent GIB. Admitted from 10/29-10/31/25. Underwent EGD which showed non-bleeding gastric ulcer which was biopsied. Advised avoid NSAIDs including ASA prescribed for his CAD. Needs repeat EGD in January. Pre op Hgb improved to 11.7.   Patient follows with Cardiology for nonobstructive CAD and HTN. Last seen by Dr Okey on 08/12/23. He reported some LE edema. PCP had added spironolactone and it resolved. Otherwise patient overall doing well. He was cleared by Dr. Okey in 10/10/23 telephone note:  Pt seen in clinic in June 2025    HE was doing well    From cardiac standpoint he is at low risk for major cardiac event with planned hip surgery  OK to proceed  Labs notable for moderate hyponatremia which is similar to prior values when he was hospitalized. Per PCP notes he was previously on hydrochlorothiazide  and this was stopped due to hyponatremia. Unclear cause. Will repeat DOS.   VS: BP (!) 140/72 Comment: right arm sitting  Pulse 66   Temp 36.9 C (Oral)   Resp 16   Ht 6' (1.829 m)   Wt 80.3 kg   SpO2 100%   BMI 24.01 kg/m   PROVIDERS: Lazoff, Shawn P, DO   LABS: Labs reviewed: Acceptable for surgery. (all labs ordered are listed, but only abnormal results are displayed)  Labs Reviewed  SURGICAL PCR SCREEN - Abnormal; Notable for the following  components:      Result Value   MRSA, PCR POSITIVE (*)    Staphylococcus aureus POSITIVE (*)    All other components within normal limits  BASIC METABOLIC PANEL WITH GFR - Abnormal; Notable for the following components:   Sodium 129 (*)    Chloride 94 (*)    Glucose, Bld 114 (*)    BUN 5 (*)    All other components within normal limits  CBC - Abnormal; Notable for the following components:   Hemoglobin 11.7 (*)    HCT 36.3 (*)    All other components within normal limits  TYPE AND SCREEN    CTA GI Bleed 12/24/23:  IMPRESSION: 1. No acute inflammatory process identified within the abdomen or pelvis. 2. No active extravasation of contrast noted to suggest active GI bleeding. 3. There are patchy ground-glass opacities in the right lung lower lobe, which are nonspecific and may represent sequela of infection or inflammation. Follow-up examination is recommended in 3 months to document stability versus resolution. Bilateral lungs are otherwise clear. 4. Multiple other nonacute observations, as described above.   Aortic Atherosclerosis (ICD10-I70.0).    EKG 12/02/23 ([facility] - report only):  Sinus rhythm First degree A-V block Left axis deviation  LHC 11/14/2017:  Prox LAD lesion is 10% stenosed. Nonobstructive CAD. The left ventricular systolic function is normal. LV end diastolic pressure is mildly elevated. LVEDP 18 mm Hg. The left ventricular ejection fraction is 55-65% by visual estimate. There is no  aortic valve stenosis.   Continue aggressive preventive therapy.    Past Medical History:  Diagnosis Date   Allergy    allergic rhinitis   Anxiety    Arthritis    R knee, hands , back oa   BPH (benign prostatic hyperplasia)    Cancer (HCC)    melonoma- on face, treated with excision    Complication of anesthesia    trouble waking up with succycholine, does not have enzyme to break down succycholine   Coronary artery disease    nonobstructive   Dizziness    MRI  brain unremarkable 02/05/2015. resolved as of 01-27-2020   GERD (gastroesophageal reflux disease)    Hepatitis    Hepatitis when he was 73 years old, no positive tests since   History of melanoma 15 yrs ago   face and forehead-- previously followed by [facility] in [place]    History of migraine    none recent as of 01-27-2020   History of ulcerative colitis    non recent flares   Hyperlipidemia    Hypertension    Pre-diabetes    Pseudocholinesterase deficiency    Skin cancer    Sleep apnea    last sleep study 10 yrs ago,CPAP-  no current cpap use     Past Surgical History:  Procedure Laterality Date   acl and mensicus repair reconstruct Right 1980's   ANTERIOR CERVICAL DECOMP/DISCECTOMY FUSION  10/15/2011   Procedure: ANTERIOR CERVICAL DECOMPRESSION/DISCECTOMY FUSION 1 LEVEL;  Surgeon: Victory Gens, MD;  [facility];  Service: Neurosurgery;  Laterality: N/A;  Cervical four-five Anterior cervical decompression/diskectomy/fusion   APPENDECTOMY  1969   COLONOSCOPY  last done 2018   ELBOW SURGERY Right 2019   tendon replacement   ESOPHAGOGASTRODUODENOSCOPY N/A 12/25/2023   Procedure: EGD (ESOPHAGOGASTRODUODENOSCOPY);  Surgeon: Stacia Glendia BRAVO, MD;  Location: [facility] ENDOSCOPY;  Service: Gastroenterology;  Laterality: N/A;   EYE SURGERY Bilateral    cataract extracton w/ IOL   HERNIA REPAIR     inguinal / abdominal - repaired as an infant    JOINT REPLACEMENT     KNEE ARTHROSCOPY Left last done oct 2013   x 3   KNEE SURGERY  yrs ago   right knee   LEFT HEART CATH AND CORONARY ANGIOGRAPHY N/A 11/14/2017   Procedure: LEFT HEART CATH AND CORONARY ANGIOGRAPHY;  Surgeon: Dann Candyce RAMAN, MD;  [facility]: [facility] INVASIVE CV LAB;  Service: Cardiovascular;  Laterality: N/A;   LUMBAR LAMINECTOMY WITH COFLEX 2 LEVEL N/A 09/12/2015   Procedure: L3-4 L4-5 Laminectomy with coflex;  Surgeon: Victory Gens, MD;  [facility];  Service: Neurosurgery;  Laterality: N/A;  L3-4 L4-5  Laminectomy with coflex   NASAL SINUS SURGERY     multiple sinus surgery - 1980's    SKIN CANCER EXCISION  2023   TONSILLECTOMY  1969   TOTAL KNEE ARTHROPLASTY Left 10/22/2012   Procedure: TOTAL KNEE ARTHROPLASTY;  Surgeon: Reyes JAYSON Billing, MD;  [facility];  Service: Orthopedics;  Laterality: Left;   TOTAL KNEE REVISION WITH SCAR DEBRIDEMENT/PATELLA REVISION WITH POLY EXCHANGE Left 05/18/2020   Procedure: Left knee excision saphenous nerve branch, neurectomy, left open scar excision with polyethlene revision;  Surgeon: Ernie Cough, MD;  [facility];  Service: Orthopedics;  Laterality: Left;  90 mins Spinal with adductor block   TRANSURETHRAL RESECTION OF PROSTATE N/A 01/31/2020   Procedure: TRANSURETHRAL RESECTION OF THE PROSTATE (TURP);  Surgeon: Matilda Senior, MD;  Location: [facility];  Service: Urology;  Laterality: N/A;  76 MIS   UPPER GASTROINTESTINAL ENDOSCOPY      MEDICATIONS:  Bioflavonoid Products (VITAMIN C) CHEW   carvedilol  (COREG ) 12.5 MG tablet   escitalopram  (LEXAPRO ) 20 MG tablet   esomeprazole  (NEXIUM ) 40 MG capsule   ferrous sulfate  325 (65 FE) MG tablet   fluticasone  (FLONASE ) 50 MCG/ACT nasal spray   gabapentin  (NEURONTIN ) 100 MG tablet   Melatonin 10 MG TABS   oxyCODONE -acetaminophen  (PERCOCET) 10-325 MG tablet   rosuvastatin  (CRESTOR ) 20 MG tablet   No current facility-administered medications for this encounter.   Burnard CHRISTELLA Odis DEVONNA [facility] Short Stay/Anesthesiology [facility] Phone [phone] 02/24/2024 11:32 AM       "

## 2024-02-24 NOTE — Anesthesia Preprocedure Evaluation (Addendum)
 "                                  Anesthesia Evaluation  Patient identified by MRN, date of birth, ID band Patient awake    Reviewed: Allergy & Precautions, NPO status , Patient's Chart, lab work & pertinent test results  History of Anesthesia Complications (+) PSEUDOCHOLINESTERASE DEFICIENCY and history of anesthetic complications  Airway Mallampati: II  TM Distance: >3 FB Neck ROM: Full    Dental no notable dental hx. (+) Teeth Intact, Dental Advisory Given   Pulmonary sleep apnea    Pulmonary exam normal breath sounds clear to auscultation       Cardiovascular hypertension, Pt. on home beta blockers and Pt. on medications (-) angina + CAD  (-) Past MI Normal cardiovascular exam Rhythm:Regular Rate:Normal     Neuro/Psych  Headaches  Anxiety      Neuromuscular disease    GI/Hepatic PUD,GERD  Controlled and Medicated,,  Endo/Other    Renal/GU Lab Results      Component                Value               Date                           K                        3.8                 02/23/2024                   CREATININE               0.69                02/23/2024                      Musculoskeletal  (+) Arthritis ,    Abdominal   Peds  Hematology Lab Results      Component                Value               Date                      WBC                      7.3                 02/23/2024                HGB                      11.7 (L)            02/23/2024                HCT                      36.3 (L)            02/23/2024                MCV  83.3                02/23/2024                PLT                      358                 02/23/2024              Anesthesia Other Findings All: heparin ,  vancomycin   Reproductive/Obstetrics                              Anesthesia Physical Anesthesia Plan  ASA: 3  Anesthesia Plan: Spinal   Post-op Pain Management: Regional block*   Induction:  Intravenous  PONV Risk Score and Plan: 2 and Treatment may vary due to age or medical condition, Ondansetron  and Propofol  infusion  Airway Management Planned: Nasal Cannula and Natural Airway  Additional Equipment: None  Intra-op Plan:   Post-operative Plan: Extubation in OR  Informed Consent: I have reviewed the patients History and Physical, chart, labs and discussed the procedure including the risks, benefits and alternatives for the proposed anesthesia with the patient or authorized representative who has indicated his/her understanding and acceptance.     Dental advisory given  Plan Discussed with: CRNA and Surgeon  Anesthesia Plan Comments: (See PAT note from 12/29)         Anesthesia Quick Evaluation  "

## 2024-03-01 NOTE — H&P (Signed)
 TOTAL HIP ADMISSION H&P  Patient is admitted for right total hip arthroplasty.  Therapy Plans: HEP Disposition: Home with daughter Planned DVT Prophylaxis: aspirin  81mg  BID DME needed: none PCP: Dr. Lazoff - GI: Dr. Stacia - GI ulcer (12/29) TXA: IV Allergies: heparin  - anaphylaxis, PCN, vancomycin  - hives Anesthesia Concerns: succinylcholine issues BMI: 25.8 Last HgbA1c: none   Other: - Daughter is an [employer]ITY [employer] at [employer] in ortho - Hx of bleeding ulcer october 2025// sees Dr. Stacia -- NO NSAIDs - Percocet 10 mg q6h at baseline - tizanidine , dilaudid  post op, tylenol   RIGHT KNEE INJECTION AT TIME OF SURGERY   Subjective:  Chief Complaint: Right hip pain  HPI: Dylan [name] Jobs, 74 y.o. male, has a history of pain and functional disability in the right hip due to arthritis and patient has failed non-surgical conservative treatments for greater than 12 weeks to include NSAID's and/or analgesics and activity modification. Onset of symptoms was gradual, starting 2 years ago with gradual worsening course since that time. The patient noted no prior surgeries on the right  hip. Patient currently rates pain in the right hip at 8 out of 10 with activity. Patient has worsening of pain with activity and weight bearing, pain that interfers with activities of daily living, and pain with passive range of motion. Patient has evidence of joint space narrowing by imaging studies. This condition presents safety issues increasing the risk of falls. There is no current active infection.  Patient Active Problem List   Diagnosis Date Noted   Gastric ulcer with hemorrhage 12/25/2023   Melena 12/25/2023   Acute blood loss anemia 12/25/2023   GI bleed 12/24/2023   Diarrhea 08/14/2022   Hx of adenomatous colonic polyps 12/12/2020   S/P revision of total knee, left 05/18/2020   Enlarged prostate with urinary obstruction 01/31/2020   Lumbago with sciatica, right side 01/26/2020   Other chronic pain  01/26/2020   Chronic midline low back pain 11/24/2019   Coronary artery disease involving native heart without angina pectoris 11/24/2019   Dyslipidemia 11/24/2019   Elevated blood sugar 11/24/2019   Gastroesophageal reflux disease without esophagitis 11/24/2019   Seasonal allergies 11/24/2019   Overweight (BMI 25.0-29.9) 10/19/2018   Abnormal stress test    Cervical radiculopathy 08/14/2017   Chronic use of opiate for therapeutic purpose 07/22/2017   Ulnar neuropathy of both upper extremities 06/19/2017   Numbness and tingling in both hands 06/16/2017   Encounter for long-term opiate analgesic use 06/16/2017   Spondylitis with radiculitis, cervical 03/12/2017   Routine general medical examination at a health care facility 11/11/2016   Oropharyngeal dysphagia 03/05/2016   Lumbar stenosis with neurogenic claudication 09/12/2015   Hypertension 05/10/2011   BPH (benign prostatic hyperplasia) 08/18/2010   OA (osteoarthritis) 11/15/2009   CARPAL TUNNEL SYNDROME 08/07/2009   OSA on CPAP 03/22/2009   Insomnia w/ sleep apnea 03/22/2009   Hx of malignant melanoma 11/17/2008   Hyperlipidemia LDL goal <100 11/17/2008   Allergic rhinitis 11/17/2008    Past Medical History:  Diagnosis Date   Allergy    allergic rhinitis   Anxiety    Arthritis    R knee, hands , back oa   BPH (benign prostatic hyperplasia)    Cancer (HCC)    melonoma- on face, treated with excision    Complication of anesthesia    trouble waking up with succycholine, does not have enzyme to break down succycholine   Coronary artery disease    nonobstructive   Dizziness  MRI brain unremarkable 02/05/2015. resolved as of 01-27-2020   GERD (gastroesophageal reflux disease)    Hepatitis    Hepatitis when he was 74 years old, no positive tests since   History of melanoma 15 yrs ago   face and forehead-- previously followed by [facility] in [place]    History of migraine    none recent as of 01-27-2020   History of  ulcerative colitis    non recent flares   Hyperlipidemia    Hypertension    Pre-diabetes    Pseudocholinesterase deficiency    Skin cancer    Sleep apnea    last sleep study 10 yrs ago,CPAP-  no current cpap use     Past Surgical History:  Procedure Laterality Date   acl and mensicus repair reconstruct Right 1980's   ANTERIOR CERVICAL DECOMP/DISCECTOMY FUSION  10/15/2011   Procedure: ANTERIOR CERVICAL DECOMPRESSION/DISCECTOMY FUSION 1 LEVEL;  Surgeon: Victory Gens, MD;  [facility];  Service: Neurosurgery;  Laterality: N/A;  Cervical four-five Anterior cervical decompression/diskectomy/fusion   APPENDECTOMY  1969   COLONOSCOPY  last done 2018   ELBOW SURGERY Right 2019   tendon replacement   ESOPHAGOGASTRODUODENOSCOPY N/A 12/25/2023   Procedure: EGD (ESOPHAGOGASTRODUODENOSCOPY);  Surgeon: Stacia Glendia BRAVO, MD;  Location: [facility] ENDOSCOPY;  Service: Gastroenterology;  Laterality: N/A;   EYE SURGERY Bilateral    cataract extracton w/ IOL   HERNIA REPAIR     inguinal / abdominal - repaired as an infant    JOINT REPLACEMENT     KNEE ARTHROSCOPY Left last done oct 2013   x 3   KNEE SURGERY  yrs ago   right knee   LEFT HEART CATH AND CORONARY ANGIOGRAPHY N/A 11/14/2017   Procedure: LEFT HEART CATH AND CORONARY ANGIOGRAPHY;  Surgeon: Dann Candyce RAMAN, MD;  Location: [facility] INVASIVE CV LAB;  Service: Cardiovascular;  Laterality: N/A;   LUMBAR LAMINECTOMY WITH COFLEX 2 LEVEL N/A 09/12/2015   Procedure: L3-4 L4-5 Laminectomy with coflex;  Surgeon: Victory Gens, MD;  [facility];  Service: Neurosurgery;  Laterality: N/A;  L3-4 L4-5 Laminectomy with coflex   NASAL SINUS SURGERY     multiple sinus surgery - 1980's    SKIN CANCER EXCISION  2023   TONSILLECTOMY  1969   TOTAL KNEE ARTHROPLASTY Left 10/22/2012   Procedure: TOTAL KNEE ARTHROPLASTY;  Surgeon: Reyes JAYSON Billing, MD;  [facility];  Service: Orthopedics;  Laterality: Left;   TOTAL KNEE REVISION WITH SCAR  DEBRIDEMENT/PATELLA REVISION WITH POLY EXCHANGE Left 05/18/2020   Procedure: Left knee excision saphenous nerve branch, neurectomy, left open scar excision with polyethlene revision;  Surgeon: Ernie Cough, MD;  [facility];  Service: Orthopedics;  Laterality: Left;  90 mins Spinal with adductor block   TRANSURETHRAL RESECTION OF PROSTATE N/A 01/31/2020   Procedure: TRANSURETHRAL RESECTION OF THE PROSTATE (TURP);  Surgeon: Matilda Senior, MD;  Location: [facility];  Service: Urology;  Laterality: N/A;  76 MIS   UPPER GASTROINTESTINAL ENDOSCOPY      Prior to Admission medications  Medication Sig Start Date End Date Taking? Authorizing Provider  Bioflavonoid Products (VITAMIN C) CHEW Chew 1 tablet by mouth daily.   Yes [provider]  carvedilol  (COREG ) 12.5 MG tablet Take 12.5 mg by mouth 2 (two) times daily with a meal.   Yes [provider]  escitalopram  (LEXAPRO ) 20 MG tablet Take 20 mg by mouth daily. 12/11/20  Yes [provider]  esomeprazole  (NEXIUM ) 40 MG capsule Take 1 capsule (  40 mg total) by mouth 2 (two) times daily before a meal. 12/26/23 02/24/24 Yes Elgergawy, Brayton RAMAN, MD  ferrous sulfate  325 (65 FE) MG tablet Take 1 tablet (325 mg total) by mouth 2 (two) times daily with a meal. 12/26/23  Yes Elgergawy, Brayton RAMAN, MD  gabapentin  (NEURONTIN ) 100 MG tablet Take 400 mg by mouth in the morning, at noon, and at bedtime.   Yes [provider]  Melatonin 10 MG TABS Take 20 mg by mouth at bedtime.   Yes [provider]  oxyCODONE -acetaminophen  (PERCOCET) 10-325 MG tablet Take 1 tablet by mouth 4 (four) times daily as needed for pain.   Yes [provider]  fluticasone  (FLONASE ) 50 MCG/ACT nasal spray Place 2 sprays into both nostrils daily. Patient not taking: Reported on 02/17/2024 05/21/23   [name], [name]  rosuvastatin  (CRESTOR ) 20 MG tablet TAKE 1 TABLET BY MOUTH EVERY DAY Patient not taking:  Reported on 02/17/2024 11/25/23   [name], MD    Allergies[1]  Social History   Socioeconomic History   Marital status: Widowed    Spouse name: Not on file   Number of children: 3   Years of education: Not on file   Highest education level: Not on file  Occupational History   Occupation: retired  Tobacco Use   Smoking status: Never   Smokeless tobacco: Former    Types: Chew    Quit date: 02/26/2003  Vaping Use   Vaping status: Never Used  Substance and Sexual Activity   Alcohol use: No   Drug use: No   Sexual activity: Not Currently  Other Topics Concern   Not on file  Social History Narrative   Wife works at cath lab at [employer] advanced breast cancer   Lives with wife in a 2 story home.  Retired.  Education: college.    Social Drivers of Health   Tobacco Use: Medium Risk (02/24/2024)   Received from [facility]   Patient History    Smoking Tobacco Use: Never    Smokeless Tobacco Use: Former    Passive Exposure: Never  Programmer, Applications: Not on file  Food Insecurity: Unknown (02/23/2024)   Received from [facility]   Epic    Within the past 12 months, you worried that your food would run out before you got money to buy more: Patient declined to answer    Within the past 12 months, the food you bought just didn't last and you didn't have money to get more. : Patient declined to answer  Transportation Needs: Not on file (02/23/2024)  Physical Activity: Not on file  Stress: No Stress Concern Present (04/12/2023)   Received from [facility] of Occupational Health - Occupational Stress Questionnaire    Feeling of Stress : Not at all  Social Connections: Moderately Integrated (12/24/2023)   Social Connection and Isolation Panel    Frequency of Communication with Friends and Family: More than three times a week    Frequency of Social Gatherings with Friends and Family: More than three times a week    Attends Religious Services:  More than 4 times per year    Active Member of [employer] or Organizations: Yes    Attends Banker Meetings: More than 4 times per year    Marital Status: Widowed  Intimate Partner Violence: Not At Risk (12/24/2023)   Epic    Fear of Current or Ex-Partner: No    Emotionally Abused: No  Physically Abused: No    Sexually Abused: No  Depression (PHQ2-9): Not on file  Alcohol Screen: Not on file  Housing: Not on file (02/23/2024)  Utilities: Unknown (02/23/2024)   Received from [facility]   Utilities    In the past 12 months has the electric, gas, oil, or water  company threatened to shut off services in your home? : Patient declined to answer  Health Literacy: Not on file    Tobacco Use: Medium Risk (02/24/2024)   Received from [facility]   Patient History    Smoking Tobacco Use: Never    Smokeless Tobacco Use: Former    Passive Exposure: Never   Social History   Substance and Sexual Activity  Alcohol Use No    Family History  Problem Relation Age of Onset   Ovarian cancer Mother    Diabetes Father    Cancer Brother        brain tumor   Colon cancer Neg Hx    Stomach cancer Neg Hx    Rectal cancer Neg Hx    Esophageal cancer Neg Hx    Liver cancer Neg Hx    Colon polyps Neg Hx     Review Of Systems: Constitutional: Constitutional: no fever, chills, night sweats, or significant weight loss. Cardiovascular: Cardiovascular: no palpitations or chest pain. Respiratory: Respiratory: no cough or shortness of breath and No COPD. Gastrointestinal: Gastrointestinal: no vomiting or nausea. Musculoskeletal: Musculoskeletal: Joint Pain and swelling in Joints. Neurologic: Neurologic: no numbness, tingling, or difficulty with balance.   Objective:  Physical Exam: Right knee exam: No palpable effusion, warmth erythema 5 degree flexion contracture with flexion over 100 degrees Tenderness medially  Left knee exam: Well-healed surgical incision from prior  arthroplasty No significant flexion contracture with flexion of 110 degrees  Right hip exam: Reproducible groin pain with hip flexion internal and external rotation Weakness due to pain with active hip flexion involving both his right hip and right knee  Vital signs in last 24 hours:    Imaging Review Plain radiographs demonstrate severe degenerative joint disease of the right hip. The bone quality appears to be adequate for age and reported activity level.  Assessment/Plan:  End stage arthritis, right hip  The patient history, physical examination, clinical judgement of the provider and imaging studies are consistent with end stage degenerative joint disease of the right hip and total hip arthroplasty is deemed medically necessary. The treatment options including medical management, injection therapy, arthroscopy and arthroplasty were discussed at length. The risks and benefits of total hip arthroplasty were presented and reviewed. The risks due to aseptic loosening, infection, stiffness, dislocation/subluxation, thromboembolic complications and other imponderables were discussed. The patient acknowledged the explanation, agreed to proceed with the plan and consent was signed. Patient is being admitted for inpatient treatment for surgery, pain control, PT, OT, prophylactic antibiotics, VTE prophylaxis, progressive ambulation and ADLs and discharge planning.The patient is planning to be discharged home.  Rosina Calin, [name] Orthopedic Surgery [facility] [place] [phone]      [1]  Allergies Allergen Reactions   Heparin  Anaphylaxis and Shortness Of Breath   Firvanq  [Vancomycin ] Hives and Other (See Comments)    Redman Syndrome   Quelicin [Succinylcholine Chloride] Other (See Comments)    Does not wake up from anesthetic. Patient reports through testing that he was found to not produce the enzyme required to breakdown the succinylcholine.   Penicillins Rash    Childhood  reaction   Tolerated Cephalosporin Date: 05/18/20.

## 2024-03-02 ENCOUNTER — Ambulatory Visit (HOSPITAL_COMMUNITY): Admitting: Anesthesiology

## 2024-03-02 ENCOUNTER — Encounter (HOSPITAL_COMMUNITY): Admission: RE | Disposition: A | Payer: Self-pay | Source: Home / Self Care | Attending: Orthopedic Surgery

## 2024-03-02 ENCOUNTER — Encounter: Payer: Self-pay | Admitting: Gastroenterology

## 2024-03-02 ENCOUNTER — Other Ambulatory Visit: Payer: Self-pay

## 2024-03-02 ENCOUNTER — Ambulatory Visit (HOSPITAL_COMMUNITY)

## 2024-03-02 ENCOUNTER — Encounter (HOSPITAL_COMMUNITY): Payer: Self-pay | Admitting: Medical

## 2024-03-02 ENCOUNTER — Observation Stay (HOSPITAL_COMMUNITY)

## 2024-03-02 ENCOUNTER — Encounter (HOSPITAL_COMMUNITY): Payer: Self-pay | Admitting: Orthopedic Surgery

## 2024-03-02 ENCOUNTER — Observation Stay (HOSPITAL_COMMUNITY)
Admission: RE | Admit: 2024-03-02 | Discharge: 2024-03-03 | Disposition: A | Discharge status: Home / Self Care | Attending: Orthopedic Surgery | Admitting: Orthopedic Surgery

## 2024-03-02 DIAGNOSIS — M1611 Unilateral primary osteoarthritis, right hip: Principal | ICD-10-CM | POA: Insufficient documentation

## 2024-03-02 DIAGNOSIS — I2510 Atherosclerotic heart disease of native coronary artery without angina pectoris: Secondary | ICD-10-CM | POA: Insufficient documentation

## 2024-03-02 DIAGNOSIS — Z96652 Presence of left artificial knee joint: Secondary | ICD-10-CM | POA: Insufficient documentation

## 2024-03-02 DIAGNOSIS — I10 Essential (primary) hypertension: Secondary | ICD-10-CM | POA: Diagnosis not present

## 2024-03-02 DIAGNOSIS — Z96641 Presence of right artificial hip joint: Secondary | ICD-10-CM

## 2024-03-02 DIAGNOSIS — Z85828 Personal history of other malignant neoplasm of skin: Secondary | ICD-10-CM | POA: Insufficient documentation

## 2024-03-02 DIAGNOSIS — Z79899 Other long term (current) drug therapy: Secondary | ICD-10-CM | POA: Insufficient documentation

## 2024-03-02 HISTORY — PX: TOTAL HIP ARTHROPLASTY: SHX124

## 2024-03-02 LAB — TYPE AND SCREEN
ABO/RH(D): A POS
Antibody Screen: NEGATIVE

## 2024-03-02 MED ORDER — SODIUM CHLORIDE (PF) 0.9 % IJ SOLN
INTRAMUSCULAR | Status: DC | PRN
  Administered 2024-03-02: 61 mL

## 2024-03-02 MED ORDER — PHENYLEPHRINE 80 MCG/ML (10ML) SYRINGE FOR IV PUSH (FOR BLOOD PRESSURE SUPPORT)
PREFILLED_SYRINGE | INTRAVENOUS | Status: AC
  Filled 2024-03-02: qty 10

## 2024-03-02 MED ORDER — MIDAZOLAM HCL (PF) 2 MG/2ML IJ SOLN
INTRAMUSCULAR | Status: DC | PRN
  Administered 2024-03-02: 2 mg via INTRAVENOUS

## 2024-03-02 MED ORDER — SODIUM CHLORIDE (PF) 0.9 % IJ SOLN
INTRAMUSCULAR | Status: AC
  Filled 2024-03-02: qty 30

## 2024-03-02 MED ORDER — BUPIVACAINE-EPINEPHRINE (PF) 0.25% -1:200000 IJ SOLN
INTRAMUSCULAR | Status: AC
  Filled 2024-03-02: qty 30

## 2024-03-02 MED ORDER — TRIAMCINOLONE ACETONIDE 40 MG/ML IJ SUSP
INTRAMUSCULAR | Status: DC | PRN
  Administered 2024-03-02: 5 mL

## 2024-03-02 MED ORDER — CHLORHEXIDINE GLUCONATE 0.12 % MT SOLN
15.0000 mL | Freq: Once | OROMUCOSAL | Status: AC
  Administered 2024-03-02: 15 mL via OROMUCOSAL

## 2024-03-02 MED ORDER — GABAPENTIN 400 MG PO CAPS
400.0000 mg | ORAL_CAPSULE | Freq: Three times a day (TID) | ORAL | Status: DC
  Administered 2024-03-02 – 2024-03-03 (×2): 400 mg via ORAL
  Filled 2024-03-02 (×2): qty 1

## 2024-03-02 MED ORDER — 0.9 % SODIUM CHLORIDE (POUR BTL) OPTIME
TOPICAL | Status: DC | PRN
  Administered 2024-03-02: 1000 mL

## 2024-03-02 MED ORDER — OXYCODONE HCL 5 MG PO TABS
ORAL_TABLET | ORAL | Status: AC
  Filled 2024-03-02: qty 1

## 2024-03-02 MED ORDER — HYDROMORPHONE HCL 1 MG/ML IJ SOLN
INTRAMUSCULAR | Status: AC
  Filled 2024-03-02: qty 1

## 2024-03-02 MED ORDER — KETOROLAC TROMETHAMINE 30 MG/ML IJ SOLN
INTRAMUSCULAR | Status: AC
  Filled 2024-03-02: qty 1

## 2024-03-02 MED ORDER — MENTHOL 3 MG MT LOZG: 1.0000 | LOZENGE | OROMUCOSAL | Status: DC | PRN

## 2024-03-02 MED ORDER — ONDANSETRON HCL 4 MG/2ML IJ SOLN
INTRAMUSCULAR | Status: AC
  Filled 2024-03-02: qty 2

## 2024-03-02 MED ORDER — HYDROMORPHONE HCL 1 MG/ML IJ SOLN
0.5000 mg | INTRAMUSCULAR | Status: DC | PRN
  Administered 2024-03-02 – 2024-03-03 (×4): 1 mg via INTRAVENOUS
  Filled 2024-03-02 (×3): qty 1

## 2024-03-02 MED ORDER — ASPIRIN 81 MG PO CHEW
81.0000 mg | CHEWABLE_TABLET | Freq: Two times a day (BID) | ORAL | Status: DC
  Administered 2024-03-02 – 2024-03-03 (×2): 81 mg via ORAL
  Filled 2024-03-02 (×2): qty 1

## 2024-03-02 MED ORDER — LACTATED RINGERS IV SOLN: INTRAVENOUS | Status: DC

## 2024-03-02 MED ORDER — VANCOMYCIN HCL IN DEXTROSE 1-5 GM/200ML-% IV SOLN
1000.0000 mg | Freq: Once | INTRAVENOUS | Status: AC
  Administered 2024-03-02: 1000 mg via INTRAVENOUS
  Filled 2024-03-02: qty 200

## 2024-03-02 MED ORDER — TRANEXAMIC ACID-NACL 1000-0.7 MG/100ML-% IV SOLN
1000.0000 mg | INTRAVENOUS | Status: AC
  Administered 2024-03-02: 1000 mg via INTRAVENOUS
  Filled 2024-03-02: qty 100

## 2024-03-02 MED ORDER — TRIAMCINOLONE ACETONIDE 40 MG/ML IJ SUSP
INTRAMUSCULAR | Status: AC
  Filled 2024-03-02: qty 1

## 2024-03-02 MED ORDER — PHENYLEPHRINE HCL (PRESSORS) 10 MG/ML IV SOLN
INTRAVENOUS | Status: DC | PRN
  Administered 2024-03-02: 80 ug via INTRAVENOUS

## 2024-03-02 MED ORDER — ESCITALOPRAM OXALATE 20 MG PO TABS
20.0000 mg | ORAL_TABLET | Freq: Every day | ORAL | Status: DC
  Administered 2024-03-03: 20 mg via ORAL
  Filled 2024-03-02: qty 1

## 2024-03-02 MED ORDER — CEFAZOLIN SODIUM-DEXTROSE 2-4 GM/100ML-% IV SOLN
2.0000 g | INTRAVENOUS | Status: AC
  Administered 2024-03-02: 2 g via INTRAVENOUS
  Filled 2024-03-02: qty 100

## 2024-03-02 MED ORDER — TIZANIDINE HCL 4 MG PO TABS
2.0000 mg | ORAL_TABLET | Freq: Three times a day (TID) | ORAL | Status: DC | PRN
  Administered 2024-03-03: 2 mg via ORAL
  Filled 2024-03-02: qty 1

## 2024-03-02 MED ORDER — FERROUS SULFATE 325 (65 FE) MG PO TABS
325.0000 mg | ORAL_TABLET | Freq: Two times a day (BID) | ORAL | Status: DC
  Administered 2024-03-03: 325 mg via ORAL
  Filled 2024-03-02: qty 1

## 2024-03-02 MED ORDER — CARVEDILOL 12.5 MG PO TABS
12.5000 mg | ORAL_TABLET | Freq: Two times a day (BID) | ORAL | Status: DC
  Administered 2024-03-02 – 2024-03-03 (×2): 12.5 mg via ORAL
  Filled 2024-03-02 (×2): qty 1

## 2024-03-02 MED ORDER — ALUM & MAG HYDROXIDE-SIMETH 200-200-20 MG/5ML PO SUSP: 30.0000 mL | ORAL | Status: DC | PRN

## 2024-03-02 MED ORDER — PANTOPRAZOLE SODIUM 40 MG PO TBEC
40.0000 mg | DELAYED_RELEASE_TABLET | Freq: Every day | ORAL | Status: DC
  Administered 2024-03-03: 40 mg via ORAL
  Filled 2024-03-02: qty 1

## 2024-03-02 MED ORDER — ORAL CARE MOUTH RINSE: 15.0000 mL | Freq: Once | OROMUCOSAL | Status: AC

## 2024-03-02 MED ORDER — MELATONIN 5 MG PO TABS
20.0000 mg | ORAL_TABLET | Freq: Every day | ORAL | Status: DC
  Administered 2024-03-02: 20 mg via ORAL
  Filled 2024-03-02: qty 4

## 2024-03-02 MED ORDER — FENTANYL CITRATE (PF) 100 MCG/2ML IJ SOLN
INTRAMUSCULAR | Status: AC
  Filled 2024-03-02: qty 2

## 2024-03-02 MED ORDER — AMISULPRIDE (ANTIEMETIC) 5 MG/2ML IV SOLN: 10.0000 mg | Freq: Once | INTRAVENOUS | Status: DC | PRN

## 2024-03-02 MED ORDER — PHENOL 1.4 % MT LIQD: 1.0000 | OROMUCOSAL | Status: DC | PRN

## 2024-03-02 MED ORDER — ROSUVASTATIN CALCIUM 20 MG PO TABS
20.0000 mg | ORAL_TABLET | Freq: Every day | ORAL | Status: DC
  Administered 2024-03-03: 20 mg via ORAL
  Filled 2024-03-02: qty 1

## 2024-03-02 MED ORDER — MEPIVACAINE HCL (PF) 2 % IJ SOLN
INTRAMUSCULAR | Status: DC | PRN
  Administered 2024-03-02: 64 mg via INTRATHECAL

## 2024-03-02 MED ORDER — HYDROMORPHONE HCL 2 MG PO TABS
2.0000 mg | ORAL_TABLET | ORAL | Status: DC | PRN
  Administered 2024-03-02 – 2024-03-03 (×4): 4 mg via ORAL
  Filled 2024-03-02: qty 2
  Filled 2024-03-02 (×2): qty 1
  Filled 2024-03-02 (×2): qty 2

## 2024-03-02 MED ORDER — HYDROMORPHONE HCL 1 MG/ML IJ SOLN
0.2500 mg | INTRAMUSCULAR | Status: DC | PRN
  Administered 2024-03-02 (×4): 0.5 mg via INTRAVENOUS

## 2024-03-02 MED ORDER — HYDROMORPHONE HCL 2 MG PO TABS: 1.0000 mg | ORAL_TABLET | ORAL | Status: DC | PRN

## 2024-03-02 MED ORDER — ACETAMINOPHEN 10 MG/ML IV SOLN
INTRAVENOUS | Status: AC
  Filled 2024-03-02: qty 100

## 2024-03-02 MED ORDER — STERILE WATER FOR IRRIGATION IR SOLN
Status: DC | PRN
  Administered 2024-03-02: 2000 mL

## 2024-03-02 MED ORDER — FENTANYL CITRATE (PF) 100 MCG/2ML IJ SOLN
INTRAMUSCULAR | Status: DC | PRN
  Administered 2024-03-02 (×2): 50 ug via INTRAVENOUS

## 2024-03-02 MED ORDER — DEXAMETHASONE SOD PHOSPHATE PF 10 MG/ML IJ SOLN
8.0000 mg | Freq: Once | INTRAMUSCULAR | Status: AC
  Administered 2024-03-02: 8 mg via INTRAVENOUS

## 2024-03-02 MED ORDER — DEXAMETHASONE SOD PHOSPHATE PF 10 MG/ML IJ SOLN
10.0000 mg | Freq: Once | INTRAMUSCULAR | Status: AC
  Administered 2024-03-03: 10 mg via INTRAVENOUS
  Filled 2024-03-02: qty 1

## 2024-03-02 MED ORDER — BISACODYL 10 MG RE SUPP: 10.0000 mg | Freq: Every day | RECTAL | Status: DC | PRN

## 2024-03-02 MED ORDER — SENNA 8.6 MG PO TABS
2.0000 | ORAL_TABLET | Freq: Every day | ORAL | Status: DC
  Administered 2024-03-02: 17.2 mg via ORAL
  Filled 2024-03-02: qty 2

## 2024-03-02 MED ORDER — OXYCODONE HCL 5 MG PO TABS
5.0000 mg | ORAL_TABLET | Freq: Once | ORAL | Status: AC | PRN
  Administered 2024-03-02: 5 mg via ORAL

## 2024-03-02 MED ORDER — PROPOFOL 500 MG/50ML IV EMUL
INTRAVENOUS | Status: DC | PRN
  Administered 2024-03-02: 125 ug/kg/min via INTRAVENOUS

## 2024-03-02 MED ORDER — MIDAZOLAM HCL 2 MG/2ML IJ SOLN
INTRAMUSCULAR | Status: AC
  Filled 2024-03-02: qty 2

## 2024-03-02 MED ORDER — ONDANSETRON HCL 4 MG PO TABS: 4.0000 mg | ORAL_TABLET | Freq: Four times a day (QID) | ORAL | Status: DC | PRN

## 2024-03-02 MED ORDER — CHLORHEXIDINE GLUCONATE 4 % EX SOLN: 1.0000 | CUTANEOUS | 1 refills | Status: AC

## 2024-03-02 MED ORDER — ONDANSETRON HCL 4 MG/2ML IJ SOLN: 4.0000 mg | Freq: Once | INTRAMUSCULAR | Status: DC | PRN

## 2024-03-02 MED ORDER — ACETAMINOPHEN 500 MG PO TABS
1000.0000 mg | ORAL_TABLET | Freq: Four times a day (QID) | ORAL | Status: DC
  Administered 2024-03-02 – 2024-03-03 (×3): 1000 mg via ORAL
  Filled 2024-03-02 (×3): qty 2

## 2024-03-02 MED ORDER — ONDANSETRON HCL 4 MG/2ML IJ SOLN: 4.0000 mg | Freq: Four times a day (QID) | INTRAMUSCULAR | Status: DC | PRN

## 2024-03-02 MED ORDER — OXYCODONE HCL 5 MG/5ML PO SOLN: 5.0000 mg | Freq: Once | ORAL | Status: AC | PRN

## 2024-03-02 MED ORDER — METOCLOPRAMIDE HCL 5 MG PO TABS: 5.0000 mg | ORAL_TABLET | Freq: Three times a day (TID) | ORAL | Status: DC | PRN

## 2024-03-02 MED ORDER — LIDOCAINE HCL 1 % IJ SOLN
INTRAMUSCULAR | Status: AC
  Filled 2024-03-02: qty 20

## 2024-03-02 MED ORDER — MUPIROCIN 2 % EX OINT: 1.0000 | TOPICAL_OINTMENT | Freq: Two times a day (BID) | CUTANEOUS | 0 refills | Status: AC

## 2024-03-02 MED ORDER — CEFAZOLIN SODIUM-DEXTROSE 2-4 GM/100ML-% IV SOLN
2.0000 g | Freq: Four times a day (QID) | INTRAVENOUS | Status: AC
  Administered 2024-03-02 (×2): 2 g via INTRAVENOUS
  Filled 2024-03-02 (×2): qty 100

## 2024-03-02 MED ORDER — POVIDONE-IODINE 10 % EX SWAB: 2.0000 | Freq: Once | CUTANEOUS | Status: DC

## 2024-03-02 MED ORDER — ACETAMINOPHEN 10 MG/ML IV SOLN
1000.0000 mg | Freq: Once | INTRAVENOUS | Status: DC | PRN
  Administered 2024-03-02: 1000 mg via INTRAVENOUS

## 2024-03-02 MED ORDER — TRANEXAMIC ACID-NACL 1000-0.7 MG/100ML-% IV SOLN
1000.0000 mg | Freq: Once | INTRAVENOUS | Status: AC
  Administered 2024-03-02: 1000 mg via INTRAVENOUS
  Filled 2024-03-02: qty 100

## 2024-03-02 MED ORDER — ONDANSETRON HCL 4 MG/2ML IJ SOLN
INTRAMUSCULAR | Status: DC | PRN
  Administered 2024-03-02: 4 mg via INTRAVENOUS

## 2024-03-02 MED ORDER — DIPHENHYDRAMINE HCL 12.5 MG/5ML PO ELIX: 12.5000 mg | ORAL_SOLUTION | ORAL | Status: DC | PRN

## 2024-03-02 MED ORDER — POLYETHYLENE GLYCOL 3350 17 G PO PACK
17.0000 g | PACK | Freq: Two times a day (BID) | ORAL | Status: DC
  Administered 2024-03-02 – 2024-03-03 (×2): 17 g via ORAL
  Filled 2024-03-02 (×2): qty 1

## 2024-03-02 MED ORDER — SODIUM CHLORIDE 0.9 % IV SOLN: INTRAVENOUS | Status: DC

## 2024-03-02 MED ORDER — PHENYLEPHRINE HCL-NACL 20-0.9 MG/250ML-% IV SOLN
INTRAVENOUS | Status: AC
  Filled 2024-03-02: qty 250

## 2024-03-02 MED ORDER — METOCLOPRAMIDE HCL 5 MG/ML IJ SOLN: 5.0000 mg | Freq: Three times a day (TID) | INTRAMUSCULAR | Status: DC | PRN

## 2024-03-02 NOTE — Anesthesia Postprocedure Evaluation (Signed)
"   Anesthesia Post Note  Patient: Dylan [name]  Procedure(s) Performed: ARTHROPLASTY, HIP, TOTAL, ANTERIOR APPROACH (Right: Hip)     Patient location during evaluation: Nursing Unit Anesthesia Type: Spinal Level of consciousness: oriented and awake and alert Pain management: pain level controlled Vital Signs Assessment: post-procedure vital signs reviewed and stable Respiratory status: spontaneous breathing and respiratory function stable Cardiovascular status: blood pressure returned to baseline and stable Postop Assessment: no headache, no backache, no apparent nausea or vomiting and patient able to bend at knees Anesthetic complications: no   No notable events documented.  Last Vitals:  Vitals:   03/02/24 1430 03/02/24 1445  BP: 134/82 137/82  Pulse: 60 (!) 59  Resp: 14 16  Temp:    SpO2: 98% 98%    Last Pain:  Vitals:   03/02/24 1445  TempSrc:   PainSc: 6                  [name]      "

## 2024-03-02 NOTE — Op Note (Signed)
 MEDICAL RECORD NO.: [id number]      FACILITY:  [facility]      PHYSICIAN:  Donnice JONETTA Car  DATE OF BIRTH:  [birth year]     DATE OF PROCEDURE:  03/02/2024                                 OPERATIVE REPORT         PREOPERATIVE DIAGNOSIS: 1.  right hip osteoarthritis, 2. Right knee osteoarthritis      POSTOPERATIVE DIAGNOSIS:  1.  right hip osteoarthritis, 2 . Right knee osteoarthritis     PROCEDURE:  right total hip replacement through an anterior approach   utilizing DePuy THR system, component size 58 mm pinnacle cup, a size 36+4 neutral   Altrex liner, a size 7 Hi Actis stem with a 36+8.5 delta ceramic   ball.      SURGEON:  Donnice JONETTA. Car, M.D.      ASSISTANT:  Rosina Calin, [name]     ANESTHESIA:  Spinal.      SPECIMENS:  None.      COMPLICATIONS:  None.      BLOOD LOSS:  700 cc     DRAINS:  None.      INDICATION OF THE PROCEDURE:  Dylan [name] is a 74 y.o. male who had   presented to office for evaluation of right hip pain.  Radiographs revealed   progressive degenerative changes with bone-on-bone   articulation of the  hip joint, including subchondral cystic changes and osteophytes.  The patient had painful limited range of   motion significantly affecting their overall quality of life and function.  The patient was failing to    respond to conservative measures including medications and/or injections and activity modification and at this point was ready   to proceed with more definitive measures.  Consent was obtained for   benefit of pain relief.  Specific risks of infection, DVT, component   failure, dislocation, neurovascular injury, and need for revision surgery were reviewed in the office as well discussion of   the anterior versus posterior approach were reviewed.  In addition to his right hip pain and workup we have been following his right knee with known osteoarthritis.  He has prior history of left total knee replacement.  Based on his  ipsilateral right hip and knee pain we have elected to proceed with right total hip replacement.  At the time of the procedure he wanted to have his right knee injected to help with perioperative pain management.     PROCEDURE IN DETAIL:  The patient was brought to operative theater.   Once adequate anesthesia, preoperative antibiotics, 2 gm of Ancef , 1 gm of Vancomycin  (MRSA positive screen), 1 gm of Tranexamic Acid , and 10 mg of Decadron  were administered, the patient was positioned supine on the [employer] table.  Once the patient was safely positioned with adequate padding and protections of bony prominences we predraped out the hip, and used fluoroscopy to confirm orientation of the pelvis.   At this point in time we performed an initial timeout identifying the patient's right knee planned procedure and extremity.  His right knee was then injected with 40 mg of Kenalog  and 4 cc of lidocaine .  The injection site was dressed with a Band-Aid.  At this point we focused on the hip replacement.     The right hip was then prepped and  draped from proximal iliac crest to   mid thigh with a shower curtain technique.      Time-out was performed identifying the patient, planned procedure, and the appropriate extremity.     An incision was then made 2 cm lateral to the   anterior superior iliac spine extending over the orientation of the   tensor fascia lata muscle and sharp dissection was carried down to the   fascia of the muscle.      The fascia was then incised.  The muscle belly was identified and swept   laterally and retractor placed along the superior neck.  Following   cauterization of the circumflex vessels and removing some pericapsular   fat, a second cobra retractor was placed on the inferior neck.  A T-capsulotomy was made along the line of the   superior neck to the trochanteric fossa, then extended proximally and   distally.  Tag sutures were placed and the retractors were then placed    intracapsular.  We then identified the trochanteric fossa and   orientation of my neck cut and then made a neck osteotomy with the femur on traction.  The femoral   head was removed without difficulty or complication.  Traction was let   off and retractors were placed posterior and anterior around the   acetabulum.      The labrum and foveal tissue were debrided.  I began reaming with a 51 mm   reamer and reamed up to 57 mm reamer with good bony bed preparation and a 58 mm  cup was chosen.  The final 58 mm Pinnacle cup was then impacted under fluoroscopy to confirm the depth of penetration and orientation with respect to   Abduction and forward flexion.  A screw was placed into the ilium followed by the hole eliminator.  The final   36+4 neutral Altrex liner was impacted with good visualized rim fit.  The cup was positioned anatomically within the acetabular portion of the pelvis.      At this point, the femur was rolled to 100 degrees.  Further capsule was   released off the inferior aspect of the femoral neck.  I then   released the superior capsule proximally.  With the leg in a neutral position the hook was placed laterally   along the femur under the vastus lateralis origin and elevated manually and then held in position using the hook attachment on the bed.  The leg was then extended and adducted with the leg rolled to 100   degrees of external rotation.  Retractors were placed along the medial calcar and posteriorly over the greater trochanter.  Once the proximal femur was fully   exposed, I used a box osteotome to set orientation.  I then began   broaching with the starting chili pepper broach and passed this by hand and then broached up to 7.  With the 7 broach in place I chose a high offset neck and did several trial reductions.  The offset was appropriate, leg lengths   appeared to be equal best matched with the +8.5 head ball trial confirmed radiographically.   Given these findings, I  went ahead and dislocated the hip, repositioned all   retractors and positioned the right hip in the extended and abducted position.  The final 7 Hi Actis stem was   chosen and it was impacted down to the level of neck cut.  Based on this   and the trial reductions, a  final 36+8.5 delta ceramic ball was chosen and   impacted onto a clean and dry trunnion, and the hip was reduced.  The   hip had been irrigated throughout the case again at this point.  I did   reapproximate the superior capsular leaflet to the anterior leaflet   using #1 Vicryl.  The fascia of the   tensor fascia lata muscle was then reapproximated using #1 Vicryl and #0 Stratafix sutures.  The   remaining wound was closed with 2-0 Vicryl and running 4-0 Monocryl.   The hip was cleaned, dried, and dressed sterilely using Dermabond and   Aquacel dressing.  The patient was then brought   to recovery room in stable condition tolerating the procedure well.    PA Patti was present for the entirety of the case involved from   preoperative positioning, perioperative retractor management, general   facilitation of the case, as well as primary wound closure as assistant.            Donnice CORDOBA Ernie, M.D.        03/02/2024 1:34 PM

## 2024-03-02 NOTE — Anesthesia Procedure Notes (Signed)
 Procedure Name: MAC Date/Time: 03/02/2024 12:02 PM  Performed by: Buster Catheryn SAUNDERS, CRNAPre-anesthesia Checklist: Patient identified, Emergency Drugs available, Suction available, Patient being monitored and Timeout performed Patient Re-evaluated:Patient Re-evaluated prior to induction Oxygen Delivery Method: Simple face mask Placement Confirmation: positive ETCO2

## 2024-03-02 NOTE — Plan of Care (Signed)
" °  Problem: Education: Goal: Knowledge of the prescribed therapeutic regimen will improve Outcome: Progressing   Problem: Bowel/Gastric: Goal: Gastrointestinal status for postoperative course will improve Outcome: Progressing   Problem: Cardiac: Goal: Ability to maintain an adequate cardiac output Outcome: Progressing Goal: Will show no evidence of cardiac arrhythmias Outcome: Progressing   Problem: Nutritional: Goal: Will attain and maintain optimal nutritional status Outcome: Progressing   Problem: Neurological: Goal: Will regain or maintain usual level of consciousness Outcome: Progressing   Problem: Clinical Measurements: Goal: Ability to maintain clinical measurements within normal limits Outcome: Progressing Goal: Postoperative complications will be avoided or minimized Outcome: Progressing   Problem: Respiratory: Goal: Will regain and/or maintain adequate ventilation Outcome: Progressing Goal: Respiratory status will improve Outcome: Progressing   Problem: Skin Integrity: Goal: Demonstrates signs of wound healing without infection Outcome: Progressing   Problem: Urinary Elimination: Goal: Will remain free from infection Outcome: Progressing Goal: Ability to achieve and maintain adequate urine output Outcome: Progressing   Problem: Education: Goal: Knowledge of General Education information will improve Description: Including pain rating scale, medication(s)/side effects and non-pharmacologic comfort measures Outcome: Progressing   Problem: Health Behavior/Discharge Planning: Goal: Ability to manage health-related needs will improve Outcome: Progressing   Problem: Clinical Measurements: Goal: Ability to maintain clinical measurements within normal limits will improve Outcome: Progressing Goal: Will remain free from infection Outcome: Progressing Goal: Diagnostic test results will improve Outcome: Progressing Goal: Respiratory complications will  improve Outcome: Progressing Goal: Cardiovascular complication will be avoided Outcome: Progressing   Problem: Activity: Goal: Risk for activity intolerance will decrease Outcome: Progressing   Problem: Nutrition: Goal: Adequate nutrition will be maintained Outcome: Progressing   Problem: Coping: Goal: Level of anxiety will decrease Outcome: Progressing   Problem: Elimination: Goal: Will not experience complications related to bowel motility Outcome: Progressing Goal: Will not experience complications related to urinary retention Outcome: Progressing   Problem: Pain Managment: Goal: General experience of comfort will improve and/or be controlled Outcome: Progressing   Problem: Safety: Goal: Ability to remain free from injury will improve Outcome: Progressing   Problem: Skin Integrity: Goal: Risk for impaired skin integrity will decrease Outcome: Progressing   Problem: Education: Goal: Knowledge of the prescribed therapeutic regimen will improve Outcome: Progressing Goal: Understanding of discharge needs will improve Outcome: Progressing Goal: Individualized Educational Video(s) Outcome: Progressing   Problem: Activity: Goal: Ability to avoid complications of mobility impairment will improve Outcome: Progressing Goal: Ability to tolerate increased activity will improve Outcome: Progressing   Problem: Clinical Measurements: Goal: Postoperative complications will be avoided or minimized Outcome: Progressing   Problem: Pain Management: Goal: Pain level will decrease with appropriate interventions Outcome: Progressing   Problem: Skin Integrity: Goal: Will show signs of wound healing Outcome: Progressing   "

## 2024-03-02 NOTE — Anesthesia Procedure Notes (Signed)
 Spinal  Patient location during procedure: OR End time: 03/02/2024 12:13 PM Reason for block: surgical anesthesia  Staffing Performed: anesthesiologist  Authorized by: Jefm Garnette LABOR, MD   Performed by: Jefm Garnette LABOR, MD  Preanesthetic Checklist Completed: patient identified, IV checked, risks and benefits discussed, surgical consent, monitors and equipment checked, pre-op evaluation and timeout performed Spinal Block Patient position: sitting Prep: DuraPrep and site prepped and draped Patient monitoring: heart rate, cardiac monitor, continuous pulse ox and blood pressure Approach: midline Location: L3-4 Injection technique: single-shot Needle Needle type: Pencan  Needle gauge: 24 G Needle length: 10 cm Needle insertion depth (cm): 8 Assessment Sensory level: T4 Events: CSF return  Additional Notes  1 Attempt (s). Pt tolerated procedure well.

## 2024-03-02 NOTE — Discharge Instructions (Signed)

## 2024-03-02 NOTE — Transfer of Care (Signed)
 Immediate Anesthesia Transfer of Care Note  Patient: Dylan [name]  Procedure(s) Performed: ARTHROPLASTY, HIP, TOTAL, ANTERIOR APPROACH (Right: Hip)  Patient Location: PACU  Anesthesia Type:MAC and Spinal  Level of Consciousness: awake, alert , and oriented  Airway & Oxygen Therapy: Patient Spontanous Breathing and Patient connected to face mask oxygen  Post-op Assessment: Report given to RN and Post -op Vital signs reviewed and stable  Post vital signs: Reviewed and stable  Last Vitals:  Vitals Value Taken Time  BP    Temp    Pulse    Resp    SpO2      Last Pain:  Vitals:   03/02/24 0925  TempSrc: Oral  PainSc: 8       Patients Stated Pain Goal: 4 (03/02/24 0925)  Complications: No notable events documented.

## 2024-03-02 NOTE — Interval H&P Note (Signed)
 History and Physical Interval Note:  03/02/2024 11:03 AM  Dylan [name]  has presented today for surgery, with the diagnosis of Right hip osteoarthritis.  The various methods of treatment have been discussed with the patient and family. After consideration of risks, benefits and other options for treatment, the patient has consented to  Procedures: ARTHROPLASTY, HIP, TOTAL, ANTERIOR APPROACH (Right) as a surgical intervention.  The patient's history has been reviewed, patient examined, no change in status, stable for surgery.  I have reviewed the patient's chart and labs.  Questions were answered to the patient's satisfaction.     Dylan [name]

## 2024-03-02 NOTE — Evaluation (Addendum)
 Physical Therapy Evaluation Patient Details Name: Dylan [name] MRN: [id number] DOB: 1950-08-17 Today's Date: 03/02/2024  History of Present Illness  Patient is a 74 y.o. male s/p R THA, anterior approach on 03/02/2024 due to failure of conservative measures. Pt also had R knee injection.  Pt PMH includes but is not limited to: Lt knee excision saphenous nerve branch, neurectomy, left open scar excision with polyethlene revision on 05/18/2020 HTN, HLD, CAD, OA, anxiety, Lt TKA (2021), lumbar laminectomy, ACL, meniscal repair (8019'd), ACDF (2013) and anemia.  Clinical Impression    Dylan [name] is a 74 y.o. male POD 0 s/p  R THA. Patient reports mod I with mobility at baseline. Patient is now limited by functional impairments (see PT problem list below) and requires S for bed mobility and CGA  for transfers. Patient was able to ambulate 60 feet with RW and CGA level of assist. Patient instructed in exercise to facilitate ROM and circulation to manage edema. Patient will benefit from continued skilled PT interventions to address impairments and progress towards PLOF. Acute PT will follow to progress mobility and stair training in preparation for safe discharge home with family support and HEP.       If plan is discharge home, recommend the following: A little help with walking and/or transfers;A little help with bathing/dressing/bathroom;Assistance with cooking/housework;Assist for transportation;Help with stairs or ramp for entrance   Can travel by private vehicle        Equipment Recommendations None recommended by PT  Recommendations for Other Services       Functional Status Assessment Patient has had a recent decline in their functional status and demonstrates the ability to make significant improvements in function in a reasonable and predictable amount of time.     Precautions / Restrictions Precautions Precautions: Fall Restrictions Weight Bearing Restrictions Per Provider Order:  No RLE Weight Bearing Per Provider Order: Weight bearing as tolerated      Mobility  Bed Mobility Overal bed mobility: Needs Assistance Bed Mobility: Supine to Sit     Supine to sit: HOB elevated, Used rails, Supervision     General bed mobility comments: min cues    Transfers Overall transfer level: Needs assistance Equipment used: Rolling walker (2 wheels) Transfers: Sit to/from Stand Sit to Stand: Contact guard assist           General transfer comment: min cues    Ambulation/Gait Ambulation/Gait assistance: Contact guard assist Gait Distance (Feet): 60 Feet Assistive device: Rolling walker (2 wheels) Gait Pattern/deviations: Step-to pattern, Decreased stance time - right, Antalgic, Trunk flexed Gait velocity: decreased     General Gait Details: slight trunk flexion with B UE support at RW to offload R LE in stance phase, min cues for safety and Rw management  Stairs            Wheelchair Mobility     Tilt Bed    Modified Rankin (Stroke Patients Only)       Balance Overall balance assessment: Needs assistance Sitting-balance support: Feet supported Sitting balance-Leahy Scale: Good     Standing balance support: Bilateral upper extremity supported, During functional activity, Reliant on assistive device for balance Standing balance-Leahy Scale: Fair Standing balance comment: static standing no UE support                             Pertinent Vitals/Pain Pain Assessment Pain Assessment: 0-10 Pain Score: 5  Pain Location: R LE and  hip Pain Descriptors / Indicators: Aching, Constant, Dull, Grimacing, Operative site guarding Pain Intervention(s): Limited activity within patient's tolerance, Monitored during session, Premedicated before session, Repositioned, Ice applied    Home Living Family/patient expects to be discharged to:: Private residence Living Arrangements: Children;Spouse/significant other Available Help at Discharge:  Family Type of Home: House Home Access: Stairs to enter Entrance Stairs-Rails: Left Entrance Stairs-Number of Steps: 5 Alternate Level Stairs-Number of Steps: 14 Home Layout: Two level;Bed/bath upstairs Home Equipment: [employer] (2 wheels);Cane - single point;BSC/3in1;Shower seat Additional Comments: Daughter is nurse    Prior Function Prior Level of Function : Independent/Modified Independent;Driving             Mobility Comments: SPC and mod I for all ADLs self care tasks and IADLs       Extremity/Trunk Assessment        Lower Extremity Assessment Lower Extremity Assessment: RLE deficits/detail RLE Deficits / Details: ankle DF/PF 5/5 RLE Sensation: WNL    Cervical / Trunk Assessment Cervical / Trunk Assessment: Normal  Communication   Communication Communication: Impaired Factors Affecting Communication: Hearing impaired    Cognition Arousal: Alert Behavior During Therapy: WFL for tasks assessed/performed   PT - Cognitive impairments: No apparent impairments                         Following commands: Intact       Cueing       General Comments      Exercises Total Joint Exercises Ankle Circles/Pumps: AROM, Both, 10 reps   Assessment/Plan    PT Assessment Patient needs continued PT services  PT Problem List Decreased strength;Decreased range of motion;Decreased activity tolerance;Decreased balance;Decreased mobility;Decreased coordination;Pain       PT Treatment Interventions DME instruction;Gait training;Stair training;Functional mobility training;Therapeutic activities;Therapeutic exercise;Balance training;Neuromuscular re-education;Patient/family education;Modalities    PT Goals (Current goals can be found in the Care Plan section)  Acute Rehab PT Goals Patient Stated Goal: to be able to get stronger, put some muscle on, build a new chicken coup and have the R TKA PT Goal Formulation: With patient Time For Goal Achievement:  03/16/24 Potential to Achieve Goals: Good    Frequency 7X/week     Co-evaluation               AM-PAC PT 6 Clicks Mobility  Outcome Measure Help needed turning from your back to your side while in a flat bed without using bedrails?: None Help needed moving from lying on your back to sitting on the side of a flat bed without using bedrails?: A Little Help needed moving to and from a bed to a chair (including a wheelchair)?: A Little Help needed standing up from a chair using your arms (e.g., wheelchair or bedside chair)?: A Little Help needed to walk in hospital room?: A Little Help needed climbing 3-5 steps with a railing? : A Lot 6 Click Score: 18    End of Session  Pt left seated in recliner, all needs in place and chair alarm.       PT Visit Diagnosis: Unsteadiness on feet (R26.81);Other abnormalities of gait and mobility (R26.89);Muscle weakness (generalized) (M62.81);Difficulty in walking, not elsewhere classified (R26.2);Pain Pain - Right/Left: Right Pain - part of body: Leg;Knee;Hip    Time: 8181-8155 PT Time Calculation (min) (ACUTE ONLY): 26 min   Charges:   PT Evaluation $PT Eval Low Complexity: 1 Low PT Treatments $Therapeutic Activity: 8-22 mins PT General Charges $$ ACUTE PT VISIT: 1  Visit         [place], PT Acute Rehab   [place] VEAR Drone 03/02/2024, 7:25 PM

## 2024-03-03 ENCOUNTER — Encounter (HOSPITAL_COMMUNITY): Payer: Self-pay | Admitting: Orthopedic Surgery

## 2024-03-03 ENCOUNTER — Other Ambulatory Visit (HOSPITAL_COMMUNITY): Payer: Self-pay

## 2024-03-03 DIAGNOSIS — M1611 Unilateral primary osteoarthritis, right hip: Secondary | ICD-10-CM | POA: Diagnosis not present

## 2024-03-03 LAB — BASIC METABOLIC PANEL WITH GFR
Anion gap: 6 (ref 5–15)
BUN: 16 mg/dL (ref 8–23)
CO2: 28 mmol/L (ref 22–32)
Calcium: 8.7 mg/dL — ABNORMAL LOW (ref 8.9–10.3)
Chloride: 103 mmol/L (ref 98–111)
Creatinine, Ser: 0.7 mg/dL (ref 0.61–1.24)
GFR, Estimated: >60 mL/min
Glucose, Bld: 163 mg/dL — ABNORMAL HIGH (ref 70–99)
Potassium: 4.2 mmol/L (ref 3.5–5.1)
Sodium: 137 mmol/L (ref 135–145)

## 2024-03-03 LAB — CBC
HCT: 28.6 % — ABNORMAL LOW (ref 39.0–52.0)
Hemoglobin: 9.3 g/dL — ABNORMAL LOW (ref 13.0–17.0)
MCH: 27.4 pg (ref 26.0–34.0)
MCHC: 32.5 g/dL (ref 30.0–36.0)
MCV: 84.1 fL (ref 80.0–100.0)
Platelets: 320 K/uL (ref 150–400)
RBC: 3.4 MIL/uL — ABNORMAL LOW (ref 4.22–5.81)
RDW: 13.2 % (ref 11.5–15.5)
WBC: 14 K/uL — ABNORMAL HIGH (ref 4.0–10.5)
nRBC: 0 % (ref 0.0–0.2)

## 2024-03-03 MED ORDER — HYDROMORPHONE HCL 2 MG PO TABS
2.0000 mg | ORAL_TABLET | ORAL | 0 refills | Status: AC | PRN
  Filled 2024-03-03: qty 42, 4d supply, fill #0

## 2024-03-03 MED ORDER — ACETAMINOPHEN 500 MG PO TABS: 1000.0000 mg | ORAL_TABLET | Freq: Four times a day (QID) | ORAL | Status: AC

## 2024-03-03 MED ORDER — POLYETHYLENE GLYCOL 3350 17 GM/SCOOP PO POWD
17.0000 g | Freq: Two times a day (BID) | ORAL | 0 refills | Status: AC
  Filled 2024-03-03: qty 238, 7d supply, fill #0

## 2024-03-03 MED ORDER — SENNA 8.6 MG PO TABS
2.0000 | ORAL_TABLET | Freq: Every day | ORAL | 0 refills | Status: AC
  Filled 2024-03-03: qty 28, 14d supply, fill #0

## 2024-03-03 MED ORDER — TIZANIDINE HCL 2 MG PO TABS
2.0000 mg | ORAL_TABLET | Freq: Three times a day (TID) | ORAL | 0 refills | Status: AC | PRN
  Filled 2024-03-03: qty 40, 7d supply, fill #0

## 2024-03-03 MED ORDER — ASPIRIN 81 MG PO CHEW
81.0000 mg | CHEWABLE_TABLET | Freq: Two times a day (BID) | ORAL | 0 refills | Status: AC
  Filled 2024-03-03: qty 56, 28d supply, fill #0

## 2024-03-03 NOTE — Progress Notes (Signed)
 Patient ID: Dylan [name], male   DOB: 11/11/1950, 74 y.o.   MRN: [id number] Subjective: 1 Day Post-Op Procedures (LRB): ARTHROPLASTY, HIP, TOTAL, ANTERIOR APPROACH (Right)    Patient reports pain as mild to moderate Relatively comfortable today In his regular pajamas  He states his right knee feels ok after injection performed in the OR  Objective:   VITALS:   Vitals:   03/03/24 0136 03/03/24 0641  BP: 130/66 139/77  Pulse: 70 65  Resp: 14 15  Temp: 98.2 F (36.8 C) 98.3 F (36.8 C)  SpO2: 96% 98%    Neurovascular intact Incision: dressing C/D/I  LABS Recent Labs    03/03/24 0345  HGB 9.3*  HCT 28.6*  WBC 14.0*  PLT 320    Recent Labs    03/03/24 0345  NA 137  K 4.2  BUN 16  CREATININE 0.70  GLUCOSE 163*    No results for input(s): LABPT, INR in the last 72 hours.   Assessment/Plan: 1 Day Post-Op Procedures (LRB): ARTHROPLASTY, HIP, TOTAL, ANTERIOR APPROACH (Right)   Advance diet Up with therapy Home today after therapy RTC in 2 weeks

## 2024-03-03 NOTE — Progress Notes (Signed)
 Physical Therapy Treatment Patient Details Name: Dylan [name] MRN: [id number] DOB: 24-Sep-1950 Today's Date: 03/03/2024   History of Present Illness Patient is a 74 y.o. male s/p R THA, anterior approach on 03/02/2024 due to failure of conservative measures. Pt also had R knee injection.  Pt PMH includes but is not limited to: Lt knee excision saphenous nerve branch, neurectomy, left open scar excision with polyethlene revision on 05/18/2020 HTN, HLD, CAD, OA, anxiety, Lt TKA (2021), lumbar laminectomy, ACL, meniscal repair (8019'd), ACDF (2013) and anemia.    PT Comments  Pt pleasant and motivated, daughter who is a [employer] at [employer] present offering encouragement and support. Provided written/illustrated HEP and pt demonstrates good muscle activation and motor control; therapist demonstrates standing exercises and educates pt on performing at sturdy support surface with family present and pt verbalizes understanding. Pt amb 120 ft x2, RW with step to to step through gait pattern, 2 locking R knee instances with pt able to self correct without assist or near fall. Completed flight of steps for stair training, pt able to perform alternating between R handrail and SPC in L hand and L handrail with SPC in R hand, supv for safety. Pt reports 5/10 pain in hip at beginning of session and 6/10 at end of session; notified RN pt requesting pain medication. All education completed, pt reports good family support, ready to d/c home with HEP and daughter assisting as needed. Notified RN pt ready for d/c.   If plan is discharge home, recommend the following: A little help with walking and/or transfers;A little help with bathing/dressing/bathroom;Assistance with cooking/housework;Assist for transportation;Help with stairs or ramp for entrance   Can travel by private vehicle        Equipment Recommendations  None recommended by PT    Recommendations for Other Services       Precautions / Restrictions  Precautions Precautions: Fall Restrictions Weight Bearing Restrictions Per Provider Order: No RLE Weight Bearing Per Provider Order: Weight bearing as tolerated     Mobility  Bed Mobility Overal bed mobility: Modified Independent             General bed mobility comments: light use of bedrail, mobilizes to bedside wtihout assist or cues    Transfers Overall transfer level: Needs assistance Equipment used: Rolling walker (2 wheels) Transfers: Sit to/from Stand Sit to Stand: Supervision           General transfer comment: supv, pushing from seated surface with 1 hand and other hand on RW    Ambulation/Gait Ambulation/Gait assistance: Supervision Gait Distance (Feet): 120 Feet (x2) Assistive device: Rolling walker (2 wheels) Gait Pattern/deviations: Step-to pattern, Step-through pattern, Decreased stride length Gait velocity: decreased     General Gait Details: step to progressing to step through, BUE assisting on RW, 2 R knee locking instances with pt aware using BUE on RW to continue gait without LOB or assistance needed, conversational without complaints or dyspnea noted   Stairs Stairs: Yes Stairs assistance: Supervision Stair Management: One rail Right, One rail Left, Step to pattern, With cane Number of Stairs: 15 General stair comments: pt ascends/descends 15 steps alternating between R handrail with SPC in L hand and L handrail with SPC in R hand for ascent and descent, supv for safety, able to maintain correct sequencing, no R knee buckling or locking noted, pt without complaints, daughter present for stair training   Wheelchair Mobility     Tilt Bed    Modified Rankin (Stroke Patients Only)  Balance Overall balance assessment: Needs assistance Sitting-balance support: Feet supported Sitting balance-Leahy Scale: Good     Standing balance support: Bilateral upper extremity supported, During functional activity, Reliant on assistive device  for balance Standing balance-Leahy Scale: Fair Standing balance comment: static standing no UE support                            Communication Communication Communication: Impaired Factors Affecting Communication: Hearing impaired  Cognition Arousal: Alert Behavior During Therapy: WFL for tasks assessed/performed   PT - Cognitive impairments: No apparent impairments                         Following commands: Intact      Cueing    Exercises Total Joint Exercises Ankle Circles/Pumps: AROM, Both, 5 reps, Supine Quad Sets: AROM, Both, 5 reps, Supine Short Arc Quad: AROM, Right, 5 reps, Supine Heel Slides: AROM, Both, 5 reps, Supine Hip ABduction/ADduction: AROM, Right, 5 reps, Supine (therapist demonstrated in standing) Long Arc Quad: AROM, Right, 5 reps, Seated Knee Flexion:  (HS curl in standing therapist demonstrated) Marching in Standing:  (therapist demonstrated) Standing Hip Extension:  (therapist demonstrated)    General Comments        Pertinent Vitals/Pain Pain Assessment Pain Assessment: 0-10 Pain Score: 6  Pain Location: R hip Pain Descriptors / Indicators: Discomfort Pain Intervention(s): Limited activity within patient's tolerance, Monitored during session, Premedicated before session, Repositioned, Ice applied, Patient requesting pain meds-RN notified    Home Living                          Prior Function            PT Goals (current goals can now be found in the care plan section) Acute Rehab PT Goals Patient Stated Goal: to be able to get stronger, put some muscle on, build a new chicken coup and have the R TKA PT Goal Formulation: With patient Time For Goal Achievement: 03/16/24 Potential to Achieve Goals: Good Progress towards PT goals: Progressing toward goals    Frequency    7X/week      PT Plan      Co-evaluation              AM-PAC PT 6 Clicks Mobility   Outcome Measure  Help needed  turning from your back to your side while in a flat bed without using bedrails?: None Help needed moving from lying on your back to sitting on the side of a flat bed without using bedrails?: A Little Help needed moving to and from a bed to a chair (including a wheelchair)?: A Little Help needed standing up from a chair using your arms (e.g., wheelchair or bedside chair)?: A Little Help needed to walk in hospital room?: A Little Help needed climbing 3-5 steps with a railing? : A Little 6 Click Score: 19    End of Session Equipment Utilized During Treatment: Gait belt Activity Tolerance: Patient tolerated treatment well Patient left: in chair;with call bell/phone within reach;with family/visitor present Nurse Communication: Mobility status PT Visit Diagnosis: Unsteadiness on feet (R26.81);Other abnormalities of gait and mobility (R26.89);Muscle weakness (generalized) (M62.81);Difficulty in walking, not elsewhere classified (R26.2);Pain Pain - Right/Left: Right Pain - part of body: Leg;Knee;Hip     Time: 8879-8846 PT Time Calculation (min) (ACUTE ONLY): 33 min  Charges:    $Gait Training: 8-22 mins $Therapeutic  Exercise: 8-22 mins PT General Charges $$ ACUTE PT VISIT: 1 Visit                     Tori Katiejo Gilroy PT, DPT 03/03/2024, 12:23 PM

## 2024-03-03 NOTE — TOC Transition Note (Signed)
 Transition of Care [facility]) - Discharge Note   Patient Details  Name: [name] MRN: [id number] Date of Birth: 09/04/1950  [facility] CM/SW Contact:  Alfonse JONELLE Rex, RN Phone Number: 03/03/2024, 10:22 AM   Clinical Narrative:   Admit for scheduled R THA on 03/02/24, dc therapy HEP, has RW. No INPT CM needs     Final next level of care: Home/Self Care Barriers to Discharge: No Barriers Identified   Patient Goals and CMS Choice Patient states their goals for this hospitalization and ongoing recovery are:: return home          Discharge Placement                       Discharge Plan and Services Additional resources added to the After Visit Summary for                                       Social Drivers of Health (SDOH) Interventions SDOH Screenings   Food Insecurity: No Food Insecurity (03/02/2024)  Housing: Low Risk (03/02/2024)  Transportation Needs: No Transportation Needs (03/02/2024)  Utilities: Not At Risk (03/02/2024)  Social Connections: Moderately Integrated (03/02/2024)  Stress: No Stress Concern Present (04/12/2023)   Received from [facility]  Tobacco Use: Medium Risk (03/02/2024)     Readmission Risk Interventions    12/25/2023    4:40 PM  Readmission Risk Prevention Plan  Post Dischage Appt Complete  Medication Screening Complete  Transportation Screening Complete

## 2024-03-09 ENCOUNTER — Encounter: Admitting: Gastroenterology

## 2024-03-11 NOTE — Discharge Summary (Signed)
 Patient ID: Dylan [name] MRN: [id number] DOB/AGE: [birth year] 74 y.o.  Admit date: 03/02/2024 Discharge date: 03/03/2024  Admission Diagnoses:  Right hip osteoarthritis  Discharge Diagnoses:  Principal Problem:   S/P total right hip arthroplasty   Past Medical History:  Diagnosis Date   Allergy    allergic rhinitis   Anxiety    Arthritis    R knee, hands , back oa   BPH (benign prostatic hyperplasia)    Cancer (HCC)    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 resolved as of 01-27-2020   GERD (gastroesophageal reflux disease)    Hepatitis    Hepatitis when he was 74 years old, no positive tests since   History of melanoma 15 yrs ago   face and forehead-- previously followed by [facility] in [place]    History of migraine    none recent as of 01-27-2020   History of ulcerative colitis    non recent flares   Hyperlipidemia    Hypertension    Pre-diabetes    Pseudocholinesterase deficiency    Skin cancer    Sleep apnea    last sleep study 10 yrs ago,CPAP-  no current cpap use     Surgeries: Procedures: ARTHROPLASTY, HIP, TOTAL, ANTERIOR APPROACH on 03/02/2024   Consultants:   Discharged Condition: Improved  Hospital Course: Dylan [name] is an 74 y.o. male who was admitted 03/02/2024 for operative treatment ofS/P total right hip arthroplasty. Patient has severe unremitting pain that affects sleep, daily activities, and work/hobbies. After pre-op clearance the patient was taken to the operating room on 03/02/2024 and underwent  Procedures: ARTHROPLASTY, HIP, TOTAL, ANTERIOR APPROACH.    Patient was given perioperative antibiotics:  Anti-infectives (From admission, onward)    Start     Dose/Rate Route Frequency Ordered Stop   03/02/24 1815  ceFAZolin  (ANCEF ) IVPB  2g/100 mL premix        2 g 200 mL/hr over 30 Minutes Intravenous Every 6 hours 03/02/24 1723 03/02/24 2353   03/02/24 0930  ceFAZolin  (ANCEF ) IVPB 2g/100 mL premix        2 g 200 mL/hr over 30 Minutes Intravenous On call to O.R. 03/02/24 0916 03/02/24 1206   03/02/24 0930  vancomycin  (VANCOCIN ) IVPB 1000 mg/200 mL premix        1,000 mg 200 mL/hr over 60 Minutes Intravenous  Once 03/02/24 0916 03/02/24 1222        Patient was given sequential compression devices, early ambulation, and chemoprophylaxis to prevent DVT. Patient worked with PT and was meeting their goals regarding safe ambulation and transfers.  Patient benefited maximally from hospital stay and there were no complications.    Recent vital signs: No data found.   Recent laboratory studies: No results for input(s): WBC, HGB, HCT, PLT, NA, K, CL, CO2, BUN, CREATININE, GLUCOSE, INR, CALCIUM  in the last 72 hours.  Invalid input(s): PT, 2   Discharge Medications:   Allergies as of 03/03/2024       Reactions   Heparin  Anaphylaxis, Shortness Of Breath   Firvanq  [vancomycin ] Hives, Other (See Comments)   Redman Syndrome, Pt states he thinks med was infused too fast, this is not a true allergy    Quelicin [succinylcholine Chloride] Other (See Comments)   Does not wake  up from anesthetic. Patient reports through testing that he was found to not produce the enzyme required to breakdown the succinylcholine.   Penicillins Rash   Childhood reaction  Tolerated Cephalosporin Date: 05/18/20.        Medication List     STOP taking these medications    oxyCODONE -acetaminophen  10-325 MG tablet Commonly known as: PERCOCET       TAKE these medications    acetaminophen  500 MG tablet Commonly known as: TYLENOL  Take 2 tablets (1,000 mg total) by mouth every 6 (six) hours.   Aspirin  Low Dose 81 MG chewable tablet Generic drug: aspirin  Chew 1 tablet (81 mg total) by mouth 2 (two) times daily  for 28 days.   carvedilol  12.5 MG tablet Commonly known as: COREG  Take 12.5 mg by mouth 2 (two) times daily with a meal.   chlorhexidine  4 % external liquid Commonly known as: HIBICLENS  Apply 15 mLs (1 Application total) topically as directed for 30 doses. Use as directed daily for 5 days every other week for 6 weeks.   escitalopram  20 MG tablet Commonly known as: LEXAPRO  Take 20 mg by mouth daily.   esomeprazole  40 MG capsule Commonly known as: NEXIUM  Take 1 capsule (40 mg total) by mouth 2 (two) times daily before a meal.   FeroSul 325 (65 Fe) MG tablet Generic drug: ferrous sulfate  Take 1 tablet (325 mg total) by mouth 2 (two) times daily with a meal.   fluticasone  50 MCG/ACT nasal spray Commonly known as: FLONASE  Place 2 sprays into both nostrils daily.   gabapentin  100 MG tablet Commonly known as: NEURONTIN  Take 400 mg by mouth in the morning, at noon, and at bedtime.   HYDROmorphone  2 MG tablet Commonly known as: DILAUDID  Take 1-2 tablets (2-4 mg total) by mouth every 4 (four) hours as needed for severe pain (pain score 7-10) (pain score 7-10).   Melatonin 10 MG Tabs Take 20 mg by mouth at bedtime.   mupirocin  ointment 2 % Commonly known as: BACTROBAN  Place 1 Application into the nose 2 (two) times daily for 60 doses. Use as directed 2 times daily for 5 days every other week for 6 weeks.   polyethylene glycol powder 17 GM/SCOOP powder Commonly known as: GLYCOLAX /MIRALAX  Take 17 g by mouth 2 (two) times daily. Dissolve 1 capful (17g) in 4-8 ounces of liquid and take by mouth daily.   rosuvastatin  20 MG tablet Commonly known as: CRESTOR  TAKE 1 TABLET BY MOUTH EVERY DAY   senna 8.6 MG Tabs tablet Commonly known as: SENOKOT Take 2 tablets (17.2 mg total) by mouth at bedtime for 14 days.   tiZANidine  2 MG tablet Commonly known as: ZANAFLEX  Take 1-2 tablets (2-4 mg total) by mouth every 8 (eight) hours as needed for muscle spasms.   Vitamin C Chew Chew 1  tablet by mouth daily.               Discharge Care Instructions  (From admission, onward)           Start     Ordered   03/03/24 0000  Change dressing       Comments: Maintain surgical dressing until follow up in the clinic. If the edges start to pull up, may reinforce with tape. If the dressing is no longer working, may remove and cover with gauze and tape, but must keep the area dry and clean.  Call with any questions or concerns.   03/03/24 9185  Diagnostic Studies: DG HIP UNILAT WITH PELVIS 1V RIGHT Result Date: 03/02/2024 EXAM: 1 VIEW(S) XRAY OF THE RIGHT HIP 03/02/2024 01:29:00 PM COMPARISON: None available. CLINICAL HISTORY: [id number] Surgery, elective [id number] Surgery, elective [phone] FINDINGS: BONES AND JOINTS: Intraoperative imaging demonstrates changes of right hip replacement. Postoperative pelvis image demonstrates right hip replacement without complicating feature. Normal AP alignment. No acute bony abnormality. SOFT TISSUES: Unremarkable. IMPRESSION: 1. Right hip replacement without complicating feature and with normal AP alignment. 2. No acute bony abnormality. Electronically signed by: Franky Crease MD 03/02/2024 08:36 PM EST RP Workstation: [id number]   DG Pelvis Portable Result Date: 03/02/2024 EXAM: 1 VIEW(S) XRAY OF THE RIGHT HIP 03/02/2024 01:29:00 PM COMPARISON: None available. CLINICAL HISTORY: [id number] Surgery, elective [id number] Surgery, elective [phone] FINDINGS: BONES AND JOINTS: Intraoperative imaging demonstrates changes of right hip replacement. Postoperative pelvis image demonstrates right hip replacement without complicating feature. Normal AP alignment. No acute bony abnormality. SOFT TISSUES: Unremarkable. IMPRESSION: 1. Right hip replacement without complicating feature and with normal AP alignment. 2. No acute bony abnormality. Electronically signed by: Franky Crease MD 03/02/2024 08:36 PM EST RP Workstation: [id number]   DG C-Arm 1-60 Min-No  Report Result Date: 03/02/2024 Fluoroscopy was utilized by the requesting physician.  No radiographic interpretation.   DG C-Arm 1-60 Min-No Report Result Date: 03/02/2024 Fluoroscopy was utilized by the requesting physician.  No radiographic interpretation.    Disposition: Discharge disposition: 01-Home or Self Care       Discharge Instructions     Call MD / Call 911   Complete by: As directed    If you experience chest pain or shortness of breath, CALL 911 and be transported to the hospital emergency room.  If you develope a fever above 101 F, pus (white drainage) or increased drainage or redness at the wound, or calf pain, call your surgeon's office.   Change dressing   Complete by: As directed    Maintain surgical dressing until follow up in the clinic. If the edges start to pull up, may reinforce with tape. If the dressing is no longer working, may remove and cover with gauze and tape, but must keep the area dry and clean.  Call with any questions or concerns.   Constipation Prevention   Complete by: As directed    Drink plenty of fluids.  Prune juice may be helpful.  You may use a stool softener, such as Colace (over the counter) 100 mg twice a day.  Use MiraLax  (over the counter) for constipation as needed.   Diet - low sodium heart healthy   Complete by: As directed    Increase activity slowly as tolerated   Complete by: As directed    Weight bearing as tolerated with assist device (walker, cane, etc) as directed, use it as long as suggested by your surgeon or therapist, typically at least 4-6 weeks.   Post-operative opioid taper instructions:   Complete by: As directed    POST-OPERATIVE OPIOID TAPER INSTRUCTIONS: It is important to wean off of your opioid medication as soon as possible. If you do not need pain medication after your surgery it is ok to stop day one. Opioids include: Codeine, Hydrocodone (Norco, Vicodin), Oxycodone (Percocet, oxycontin ) and hydromorphone  amongst  others.  Long term and even short term use of opiods can cause: Increased pain response Dependence Constipation Depression Respiratory depression And more.  Withdrawal symptoms can include Flu like symptoms Nausea, vomiting And more Techniques to manage these symptoms Hydrate well Eat regular healthy meals Stay active Use  relaxation techniques(deep breathing, meditating, yoga) Do Not substitute Alcohol to help with tapering If you have been on opioids for less than two weeks and do not have pain than it is ok to stop all together.  Plan to wean off of opioids This plan should start within one week post op of your joint replacement. Maintain the same interval or time between taking each dose and first decrease the dose.  Cut the total daily intake of opioids by one tablet each day Next start to increase the time between doses. The last dose that should be eliminated is the evening dose.      TED hose   Complete by: As directed    Use stockings (TED hose) for 2 weeks on both leg(s).  You may remove them at night for sleeping.        Follow-up Information     Ernie Cough, MD. Schedule an appointment as soon as possible for a visit in 2 week(s).   Specialty: Orthopedic Surgery Contact information: [address] [place] [address] [phone]                  Signed: Rosina JONELLE Calin 03/11/2024, 6:59 AM

## 2024-03-12 NOTE — Progress Notes (Signed)
 [facility] post TCM follow up 10 day  Date of call: 03/12/24  Discharged from: [facility]  Updates/Changes since last encounter: spoke with pt reports he is doing good, soreness is improving, incision is C/D/I. Eating and drinking normally, bowels are moving. Pt had ortho f/u 1/22. Pt denies any needs or assistance at time of call, aware to call HN if needs arise.  Current Questions/Concerns: none  Is patient candidate for Navigation: no further HN warranted  Ellouise Quale, RN CHESS Navigator [phone]   Electronically signed by: Ellouise CHRISTELLA Quale, RN 03/12/2024 2:48 PM

## 2024-03-14 ENCOUNTER — Encounter (HOSPITAL_BASED_OUTPATIENT_CLINIC_OR_DEPARTMENT_OTHER): Payer: Self-pay | Admitting: Emergency Medicine

## 2024-03-14 ENCOUNTER — Emergency Department (HOSPITAL_BASED_OUTPATIENT_CLINIC_OR_DEPARTMENT_OTHER): Admission: EM | Admit: 2024-03-14 | Discharge: 2024-03-14 | Disposition: A | Discharge status: Home / Self Care

## 2024-03-14 ENCOUNTER — Emergency Department (HOSPITAL_BASED_OUTPATIENT_CLINIC_OR_DEPARTMENT_OTHER)

## 2024-03-14 DIAGNOSIS — L509 Urticaria, unspecified: Secondary | ICD-10-CM | POA: Insufficient documentation

## 2024-03-14 DIAGNOSIS — R21 Rash and other nonspecific skin eruption: Secondary | ICD-10-CM | POA: Diagnosis present

## 2024-03-14 DIAGNOSIS — Z7982 Long term (current) use of aspirin: Secondary | ICD-10-CM | POA: Diagnosis not present

## 2024-03-14 DIAGNOSIS — I10 Essential (primary) hypertension: Secondary | ICD-10-CM | POA: Diagnosis not present

## 2024-03-14 DIAGNOSIS — M7989 Other specified soft tissue disorders: Secondary | ICD-10-CM | POA: Insufficient documentation

## 2024-03-14 LAB — CBC WITH DIFFERENTIAL/PLATELET
Abs Immature Granulocytes: 0.01 K/uL (ref 0.00–0.07)
Basophils Absolute: 0 K/uL (ref 0.0–0.1)
Basophils Relative: 0 %
Eosinophils Absolute: 0 K/uL (ref 0.0–0.5)
Eosinophils Relative: 0 %
HCT: 34.4 % — ABNORMAL LOW (ref 39.0–52.0)
Hemoglobin: 11.2 g/dL — ABNORMAL LOW (ref 13.0–17.0)
Immature Granulocytes: 0 %
Lymphocytes Relative: 12 %
Lymphs Abs: 0.8 K/uL (ref 0.7–4.0)
MCH: 26.6 pg (ref 26.0–34.0)
MCHC: 32.6 g/dL (ref 30.0–36.0)
MCV: 81.7 fL (ref 80.0–100.0)
Monocytes Absolute: 0.3 K/uL (ref 0.1–1.0)
Monocytes Relative: 4 %
Neutro Abs: 5.8 K/uL (ref 1.7–7.7)
Neutrophils Relative %: 84 %
Platelets: 277 K/uL (ref 150–400)
RBC: 4.21 MIL/uL — ABNORMAL LOW (ref 4.22–5.81)
RDW: 14.3 % (ref 11.5–15.5)
WBC: 6.9 K/uL (ref 4.0–10.5)
nRBC: 0 % (ref 0.0–0.2)

## 2024-03-14 LAB — BASIC METABOLIC PANEL WITH GFR
Anion gap: 12 (ref 5–15)
BUN: 17 mg/dL (ref 8–23)
CO2: 25 mmol/L (ref 22–32)
Calcium: 9.1 mg/dL (ref 8.9–10.3)
Chloride: 95 mmol/L — ABNORMAL LOW (ref 98–111)
Creatinine, Ser: 0.62 mg/dL (ref 0.61–1.24)
GFR, Estimated: >60 mL/min
Glucose, Bld: 123 mg/dL — ABNORMAL HIGH (ref 70–99)
Potassium: 3.4 mmol/L — ABNORMAL LOW (ref 3.5–5.1)
Sodium: 132 mmol/L — ABNORMAL LOW (ref 135–145)

## 2024-03-14 MED ORDER — DIPHENHYDRAMINE HCL 25 MG PO TABS: 25.0000 mg | ORAL_TABLET | Freq: Four times a day (QID) | ORAL | 0 refills | Status: AC

## 2024-03-14 MED ORDER — DIPHENHYDRAMINE HCL 50 MG/ML IJ SOLN
25.0000 mg | Freq: Once | INTRAMUSCULAR | Status: AC
  Administered 2024-03-14: 25 mg via INTRAVENOUS
  Filled 2024-03-14: qty 1

## 2024-03-14 MED ORDER — METHYLPREDNISOLONE SODIUM SUCC 125 MG IJ SOLR
125.0000 mg | Freq: Once | INTRAMUSCULAR | Status: AC
  Administered 2024-03-14: 125 mg via INTRAVENOUS
  Filled 2024-03-14: qty 2

## 2024-03-14 MED ORDER — OXYCODONE-ACETAMINOPHEN 5-325 MG PO TABS
2.0000 | ORAL_TABLET | Freq: Once | ORAL | Status: AC
  Administered 2024-03-14: 2 via ORAL
  Filled 2024-03-14: qty 2

## 2024-03-14 MED ORDER — PREDNISONE 50 MG PO TABS: ORAL_TABLET | ORAL | 0 refills | Status: AC

## 2024-03-14 MED ORDER — FAMOTIDINE IN NACL 20-0.9 MG/50ML-% IV SOLN
20.0000 mg | Freq: Once | INTRAVENOUS | Status: AC
  Administered 2024-03-14: 20 mg via INTRAVENOUS
  Filled 2024-03-14: qty 50

## 2024-03-14 MED ORDER — FAMOTIDINE 20 MG PO TABS: 20.0000 mg | ORAL_TABLET | Freq: Two times a day (BID) | ORAL | 0 refills | Status: AC

## 2024-03-14 NOTE — Discharge Instructions (Signed)
 Your workup today was reassuring.  Please call tomorrow to schedule a follow-up appointment with your orthopedist.  Return to the ER for worsening symptoms.

## 2024-03-14 NOTE — ED Provider Notes (Signed)
 " [place] [facility] AT [facility] Provider Note   CSN: [id number] Arrival date & time: 03/14/24  9180     Patient presents with: Allergic Reaction   Dylan [name] is a 74 y.o. male.   74 year old male with past medical history of hypertension hyperlipidemia with recent hip replacement a few weeks ago presenting to the emergency department today with swelling of his hands and face.  The patient's had an itchy rash starting yesterday on his hands and upper neck.  He denies starting any new medications and has not been using any new soaps or detergents.  He came to the emergency department today due to the symptoms.  The patient denies any difficulty breathing or swallowing.  Does not have any lesions around his mucous membranes.   Allergic Reaction Presenting symptoms: rash        Prior to Admission medications  Medication Sig Start Date End Date Taking? Authorizing Provider  diphenhydrAMINE  (BENADRYL ) 25 MG tablet Take 1 tablet (25 mg total) by mouth every 6 (six) hours. 03/14/24  Yes Ula Prentice JONELLE, MD  famotidine  (PEPCID ) 20 MG tablet Take 1 tablet (20 mg total) by mouth 2 (two) times daily. 03/14/24  Yes Ula Prentice JONELLE, MD  predniSONE  (DELTASONE ) 50 MG tablet Take 1 tablet by mouth daily 03/14/24  Yes Ula Prentice JONELLE, MD  acetaminophen  (TYLENOL ) 500 MG tablet Take 2 tablets (1,000 mg total) by mouth every 6 (six) hours. 03/03/24   Patti Rosina JONELLE, [name]  aspirin  81 MG chewable tablet Chew 1 tablet (81 mg total) by mouth 2 (two) times daily for 28 days. 03/03/24 03/31/24  Patti Rosina JONELLE, [name]  Bioflavonoid Products (VITAMIN C) CHEW Chew 1 tablet by mouth daily.    [provider]  carvedilol  (COREG ) 12.5 MG tablet Take 12.5 mg by mouth 2 (two) times daily with a meal.    [provider]  chlorhexidine  (HIBICLENS ) 4 % external liquid Apply 15 mLs (1 Application total) topically as directed for 30 doses. Use as directed daily for 5 days every other week for  6 weeks. 03/02/24   Patti Rosina JONELLE, [name]  escitalopram  (LEXAPRO ) 20 MG tablet Take 20 mg by mouth daily. 12/11/20   [provider]  esomeprazole  (NEXIUM ) 40 MG capsule Take 1 capsule (40 mg total) by mouth 2 (two) times daily before a meal. 12/26/23 03/02/24  Elgergawy, Brayton RAMAN, MD  ferrous sulfate  325 (65 FE) MG tablet Take 1 tablet (325 mg total) by mouth 2 (two) times daily with a meal. 12/26/23   Elgergawy, Brayton RAMAN, MD  fluticasone  (FLONASE ) 50 MCG/ACT nasal spray Place 2 sprays into both nostrils daily. Patient not taking: Reported on 02/17/2024 05/21/23   [name], [name]  gabapentin  (NEURONTIN ) 100 MG tablet Take 400 mg by mouth in the morning, at noon, and at bedtime.    [provider]  HYDROmorphone  (DILAUDID ) 2 MG tablet Take 1-2 tablets (2-4 mg total) by mouth every 4 (four) hours as needed for severe pain (pain score 7-10) (pain score 7-10). 03/03/24   Patti Rosina JONELLE, [name]  Melatonin 10 MG TABS Take 20 mg by mouth at bedtime.    [provider]  mupirocin  ointment (BACTROBAN ) 2 % Place 1 Application into the nose 2 (two) times daily for 60 doses. Use as directed 2 times daily for 5 days every other week for 6 weeks. 03/02/24 04/01/24  Patti Rosina JONELLE, [name]  polyethylene glycol powder (GLYCOLAX /MIRALAX ) 17 GM/SCOOP powder Take 17  g by mouth 2 (two) times daily. Dissolve 1 capful (17g) in 4-8 ounces of liquid and take by mouth daily. 03/03/24   Patti Rosina SAUNDERS, [name]  rosuvastatin  (CRESTOR ) 20 MG tablet TAKE 1 TABLET BY MOUTH EVERY DAY 11/25/23   [name], [name], MD  senna (SENOKOT) 8.6 MG TABS tablet Take 2 tablets (17.2 mg total) by mouth at bedtime for 14 days. 03/03/24 03/17/24  Patti Rosina SAUNDERS, [name]  tiZANidine  (ZANAFLEX ) 2 MG tablet Take 1-2 tablets (2-4 mg total) by mouth every 8 (eight) hours as needed for muscle spasms. 03/03/24   Patti Rosina SAUNDERS, [name]    Allergies: Heparin , Firvanq  [vancomycin ], Quelicin [succinylcholine chloride], and Penicillins     Review of Systems  Skin:  Positive for rash.  All other systems reviewed and are negative.   Updated Vital Signs BP 134/75 (BP Location: Right Arm)   Pulse 88   Temp 97.8 F (36.6 C) (Oral)   Resp 17   Wt 82.6 kg   SpO2 97%   BMI 24.68 kg/m   Physical Exam Vitals and nursing note reviewed.   Gen: NAD Eyes: PERRL, EOMI HEENT: no oropharyngeal swelling Neck: trachea midline, no stridor Resp: clear to auscultation bilaterally Card: Tachycardic, no murmurs, rubs, or gallops Abd: nontender, nondistended Extremities: no calf tenderness, no edema Vascular: 2+ radial pulses bilaterally, 2+ DP pulses bilaterally Skin: The patient does have some erythema noted over his face and neck as well as his hands and lower abdomen, there did appear to be blanching lesions consistent with hives versus 1 or 2 circular lesions that could be targetoid lesions over his lower abdomen Psyc: acting appropriately   (all labs ordered are listed, but only abnormal results are displayed) Labs Reviewed  CBC WITH DIFFERENTIAL/PLATELET - Abnormal; Notable for the following components:      Result Value   RBC 4.21 (*)    Hemoglobin 11.2 (*)    HCT 34.4 (*)    All other components within normal limits  BASIC METABOLIC PANEL WITH GFR - Abnormal; Notable for the following components:   Sodium 132 (*)    Potassium 3.4 (*)    Chloride 95 (*)    Glucose, Bld 123 (*)    All other components within normal limits    EKG: None  Radiology: US  Venous Img Lower Right (DVT Study) Result Date: 03/14/2024 CLINICAL DATA:  RLE swelling.  Recent hip replacement. EXAM: RIGHT LOWER EXTREMITY VENOUS DOPPLER ULTRASOUND TECHNIQUE: Gray-scale sonography with compression, as well as color and duplex ultrasound, were performed to evaluate the deep venous system(s) from the level of the common femoral vein through the popliteal and proximal calf veins. COMPARISON:  CT AP, 12/24/2023. FINDINGS: VENOUS Normal compressibility  of the common femoral, superficial femoral, and popliteal veins, as well as the visualized calf veins. Visualized portions of profunda femoral vein and great saphenous vein unremarkable. No filling defects to suggest DVT on grayscale or color Doppler imaging. Doppler waveforms show normal direction of venous flow, normal respiratory plasticity and response to augmentation. Limited views of the contralateral common femoral vein are unremarkable. OTHER No evidence of superficial thrombophlebitis or abnormal fluid collection. Subcutaneous edema of the imaged distal RIGHT lower extremity Limitations: none IMPRESSION: No evidence of femoropopliteal DVT or superficial thrombophlebitis within the RIGHT lower extremity. Electronically Signed   By: Thom Hall M.D.   On: 03/14/2024 12:11     Procedures   Medications Ordered in the ED  methylPREDNISolone  sodium succinate (SOLU-MEDROL ) 125 mg/2 mL injection  125 mg (125 mg Intravenous Given 03/14/24 0859)  famotidine  (PEPCID ) IVPB 20 mg premix (0 mg Intravenous Stopped 03/14/24 0931)  diphenhydrAMINE  (BENADRYL ) injection 25 mg (25 mg Intravenous Given 03/14/24 0858)  oxyCODONE -acetaminophen  (PERCOCET/ROXICET) 5-325 MG per tablet 2 tablet (2 tablets Oral Given 03/14/24 1045)                                    Medical Decision Making 74 year old male with past medical history of hypertension and hyperlipidemia presenting to the emergency department today with diffuse rash and extremity swelling.  Obtain basic labs here and treat the patient with Solu-Medrol , Pepcid , and Benadryl  for further treatment for possible allergic reaction or erythema and will to for me.  He does not have any mucous membrane involvement to suggest [employer] syndrome.  It does not appear that he is on any medications that would be causing this.  I will monitor the patient here.  Does not appear to have any significant airway issues or compromise at this time.  I will reevaluate for  ultimate disposition.  The patient's labs here are reassuring.  Ultrasound does not show any DVT.  After period of observation the patient's symptoms did improve.  He is stable for discharge.  Amount and/or Complexity of Data Reviewed Labs: ordered.  Risk OTC drugs. Prescription drug management.        Final diagnoses:  Urticaria    ED Discharge Orders          Ordered    predniSONE  (DELTASONE ) 50 MG tablet        03/14/24 1306    famotidine  (PEPCID ) 20 MG tablet  2 times daily        03/14/24 1306    diphenhydrAMINE  (BENADRYL ) 25 MG tablet  Every 6 hours        03/14/24 1306               Ula Prentice SAUNDERS, MD 03/14/24 1306  "

## 2024-03-14 NOTE — ED Triage Notes (Signed)
 Pt endorses facial swelling and BUE and BLE swelling, worse on RT side starting yesterday.. Also endorses itching. Red circular raised areas noted to chest,abdomen and LE. Denies shob. Speech clear, no s/s of respiratory distress

## 2024-03-14 NOTE — ED Notes (Signed)
 Patient requested pain meds prior to ultrasound. Spoke with Dr. Ula and nurse is giving patient meds. Patient and patient's daughter request ultrasound be performed after pain meds have time to take effect. Tech will check back in after 15-20 min.
